# Patient Record
Sex: Female | Born: 1950 | Race: White | Hispanic: No | Marital: Married | Smoking: Former smoker
Health system: Southern US, Community
[De-identification: ages and names within clinical notes are randomized; demographics above are authoritative.]

## PROBLEM LIST (undated history)

## (undated) DIAGNOSIS — M1990 Unspecified osteoarthritis, unspecified site: Secondary | ICD-10-CM

## (undated) DIAGNOSIS — C3490 Malignant neoplasm of unspecified part of unspecified bronchus or lung: Secondary | ICD-10-CM

## (undated) DIAGNOSIS — J302 Other seasonal allergic rhinitis: Secondary | ICD-10-CM

## (undated) DIAGNOSIS — E039 Hypothyroidism, unspecified: Secondary | ICD-10-CM

## (undated) DIAGNOSIS — E785 Hyperlipidemia, unspecified: Secondary | ICD-10-CM

## (undated) DIAGNOSIS — J449 Chronic obstructive pulmonary disease, unspecified: Secondary | ICD-10-CM

## (undated) DIAGNOSIS — E049 Nontoxic goiter, unspecified: Secondary | ICD-10-CM

## (undated) DIAGNOSIS — F419 Anxiety disorder, unspecified: Secondary | ICD-10-CM

## (undated) HISTORY — DX: Other seasonal allergic rhinitis: J30.2

## (undated) HISTORY — PX: CORNEAL TRANSPLANT: SHX108

## (undated) HISTORY — DX: Malignant neoplasm of unspecified part of unspecified bronchus or lung: C34.90

## (undated) HISTORY — DX: Nontoxic goiter, unspecified: E04.9

## (undated) HISTORY — PX: ABDOMINAL HYSTERECTOMY: SHX81

## (undated) HISTORY — DX: Anxiety disorder, unspecified: F41.9

## (undated) HISTORY — PX: EYE SURGERY: SHX253

## (undated) HISTORY — PX: JOINT REPLACEMENT: SHX530

## (undated) HISTORY — DX: Hyperlipidemia, unspecified: E78.5

## (undated) HISTORY — DX: Chronic obstructive pulmonary disease, unspecified: J44.9

## (undated) HISTORY — PX: CATARACT EXTRACTION: SUR2

## (undated) MED FILL — Fosaprepitant Dimeglumine For IV Infusion 150 MG (Base Eq): INTRAVENOUS | Qty: 5 | Status: AC

---

## 1983-06-10 HISTORY — PX: BREAST BIOPSY: SHX20

## 1999-06-10 HISTORY — PX: BILATERAL OOPHORECTOMY: SHX1221

## 2004-04-26 ENCOUNTER — Ambulatory Visit: Payer: Self-pay | Admitting: Endocrinology

## 2008-05-11 LAB — BASIC METABOLIC PANEL: Creatinine: 1 mg/dL (ref <=1.1)

## 2008-05-11 LAB — HEPATIC FUNCTION PANEL
ALT: 20 U/L (ref 7–35)
AST: 22 U/L (ref 13–35)

## 2008-06-28 LAB — HM MAMMOGRAPHY: HM Mammogram: NORMAL

## 2008-08-18 LAB — LIPID PANEL
Cholesterol: 264 mg/dL — AB (ref 0–200)
HDL: 37 mg/dL (ref 35–70)
LDL Cholesterol: 154 mg/dL

## 2009-08-17 ENCOUNTER — Ambulatory Visit: Payer: Self-pay | Admitting: Unknown Physician Specialty

## 2009-08-17 HISTORY — PX: COLONOSCOPY: SHX174

## 2009-08-31 LAB — HM COLONOSCOPY

## 2011-07-16 ENCOUNTER — Other Ambulatory Visit: Payer: Self-pay | Admitting: *Deleted

## 2011-07-16 MED ORDER — LEVOTHYROXINE SODIUM 75 MCG PO TABS: 75.0000 ug | ORAL_TABLET | Freq: Every day | ORAL | Status: DC

## 2011-08-13 ENCOUNTER — Encounter: Payer: Self-pay | Admitting: Internal Medicine

## 2011-08-25 ENCOUNTER — Encounter: Payer: Self-pay | Admitting: Internal Medicine

## 2011-08-25 DIAGNOSIS — E049 Nontoxic goiter, unspecified: Secondary | ICD-10-CM | POA: Insufficient documentation

## 2011-09-08 LAB — HM COLONOSCOPY

## 2011-09-22 ENCOUNTER — Encounter: Payer: Self-pay | Admitting: Internal Medicine

## 2011-10-03 ENCOUNTER — Encounter: Payer: Self-pay | Admitting: Internal Medicine

## 2011-10-03 ENCOUNTER — Ambulatory Visit (INDEPENDENT_AMBULATORY_CARE_PROVIDER_SITE_OTHER): Payer: BC Managed Care – PPO | Admitting: Internal Medicine

## 2011-10-03 VITALS — BP 108/70 | HR 80 | Temp 98.3°F | Resp 16 | Ht 61.0 in | Wt 161.0 lb

## 2011-10-03 DIAGNOSIS — R5383 Other fatigue: Secondary | ICD-10-CM

## 2011-10-03 DIAGNOSIS — F411 Generalized anxiety disorder: Secondary | ICD-10-CM

## 2011-10-03 DIAGNOSIS — M65839 Other synovitis and tenosynovitis, unspecified forearm: Secondary | ICD-10-CM

## 2011-10-03 DIAGNOSIS — G4700 Insomnia, unspecified: Secondary | ICD-10-CM | POA: Insufficient documentation

## 2011-10-03 DIAGNOSIS — Z780 Asymptomatic menopausal state: Secondary | ICD-10-CM

## 2011-10-03 DIAGNOSIS — F419 Anxiety disorder, unspecified: Secondary | ICD-10-CM

## 2011-10-03 DIAGNOSIS — R5381 Other malaise: Secondary | ICD-10-CM

## 2011-10-03 DIAGNOSIS — Z5320 Procedure and treatment not carried out because of patient's decision for unspecified reasons: Secondary | ICD-10-CM | POA: Insufficient documentation

## 2011-10-03 DIAGNOSIS — E039 Hypothyroidism, unspecified: Secondary | ICD-10-CM | POA: Insufficient documentation

## 2011-10-03 DIAGNOSIS — J309 Allergic rhinitis, unspecified: Secondary | ICD-10-CM

## 2011-10-03 DIAGNOSIS — E785 Hyperlipidemia, unspecified: Secondary | ICD-10-CM

## 2011-10-03 DIAGNOSIS — M778 Other enthesopathies, not elsewhere classified: Secondary | ICD-10-CM | POA: Insufficient documentation

## 2011-10-03 DIAGNOSIS — M65849 Other synovitis and tenosynovitis, unspecified hand: Secondary | ICD-10-CM

## 2011-10-03 LAB — CBC WITH DIFFERENTIAL/PLATELET
Basophils Absolute: 0 10*3/uL (ref 0.0–0.1)
Basophils Relative: 0.9 % (ref 0.0–3.0)
Eosinophils Absolute: 0 10*3/uL (ref 0.0–0.7)
Lymphocytes Relative: 32.4 % (ref 12.0–46.0)
MCHC: 33.9 g/dL (ref 30.0–36.0)
Monocytes Absolute: 0.4 10*3/uL (ref 0.1–1.0)
Neutrophils Relative %: 57.1 % (ref 43.0–77.0)
Platelets: 209 10*3/uL (ref 150.0–400.0)
RBC: 4.52 Mil/uL (ref 3.87–5.11)
RDW: 12.4 % (ref 11.5–14.6)

## 2011-10-03 LAB — LDL CHOLESTEROL, DIRECT: Direct LDL: 158.1 mg/dL

## 2011-10-03 LAB — TSH: TSH: 1.69 u[IU]/mL (ref 0.35–5.50)

## 2011-10-03 LAB — LIPID PANEL: Cholesterol: 259 mg/dL — ABNORMAL HIGH (ref 0–200)

## 2011-10-03 MED ORDER — DICLOFENAC SODIUM 75 MG PO TBEC: 75.0000 mg | DELAYED_RELEASE_TABLET | Freq: Two times a day (BID) | ORAL | Status: AC

## 2011-10-03 MED ORDER — ESCITALOPRAM OXALATE 10 MG PO TABS: 10.0000 mg | ORAL_TABLET | Freq: Every day | ORAL | Status: DC

## 2011-10-03 MED ORDER — ZOLPIDEM TARTRATE ER 6.25 MG PO TBCR: 6.2500 mg | EXTENDED_RELEASE_TABLET | Freq: Every evening | ORAL | Status: DC | PRN

## 2011-10-03 NOTE — Patient Instructions (Addendum)
I am recommending a trial of Lexapro (escitalopram) for your hot flashes.  Start with 1/2 tablet daily in the PM, for one week, then increase to whole tablet.  For your insomnia, I am  prescirbing ambien controlled release  Take one at bedtime.  You may increase to 2 (total 12 .5 mg )  If needed after a few days,  But give the lexapro some time to work because it may also imporve the insomnia  You can use your alprazolam if you wake up in the middle of the night unable to go back to sleep  For the wrist pain, I am trrating your for tendonitis with diclofenac .  Take one tablet twice daily for at least two weeks.  You may add tylenol 500 mg up to 4 daily to this medication , but DO NOT ADD ALLEVE OR MOTRIN.  Take the Nexium once daily for the next 2 weeks (or OTC prilosec for stomach protection) while you are taking the diclofenac twice daily   Return in one month for you physical.    We will call you with the results of your labs

## 2011-10-03 NOTE — Progress Notes (Signed)
Patient ID: Lorraine Mills, female   DOB: Jan 26, 1951, 61 y.o.   MRN: [id number]    Patient Active Problem List  Diagnoses  . Goiter  . Tendonitis of wrist, right  . Menopause present, declines hormone replacement therapy  . Insomnia disorder related to known organic factor  . Hypothyroidism    Subjective:  CC:   Chief Complaint  Patient presents with  . Gynecologic Exam    HPI:   Lorraine Mills a 61 y.o. female who presents for management of persistent menopausal symptoms including flashes which have been reccurent for  20 yrs.  Does have disrupting her sleep. In spite of using 5 mg of Ambien daily she is waking up from the hot flashes times a night.  Her sister  had BRCA so HRT has been relatively contraindicated .     Her econd issue is  his bilateral wrist pain with the fourth and fifth fingers of both hands developing numbness and tingling at times. Symptoms occur more frequently and more prominently in the right hand compared to the left.  She reports feeling a white right hand is weaker than the left.  she is also reporting throbbing of the elbow and is wearing the wrist her braces at night.   there is no history of repetitive activity at work,  but  spends a moderate amount of time gardening .  She diagnosis of carpal tunnel syndrome several years ago and bought a wrist brace is over the counter.    Past Medical History  Diagnosis Date  . Goiter     Past Surgical History  Procedure Date  . Abdominal hysterectomy   . Bilateral oophorectomy 2001    BENIGN TUMOR         The following portions of the patient's history were reviewed and updated as appropriate: Allergies, current medications, and problem list.    Review of Systems:   12 Pt  review of systems was negative except those addressed in the HPI,     History   Social History  . Marital Status: Married    Spouse Name: N/A    Number of Children: N/A  . Years of Education: N/A   Occupational History  .  Not on file.   Social History Main Topics  . Smoking status: Former Smoker -- 24 years    Types: Cigarettes    Quit date: 05/09/1998  . Smokeless tobacco: Never Used  . Alcohol Use: Yes  . Drug Use: No  . Sexually Active: Not on file   Other Topics Concern  . Not on file   Social History Narrative  . No narrative on file    Objective:  BP 108/70  Pulse 80  Temp(Src) 98.3 F (36.8 C) (Oral)  Resp 16  Ht 5\' 1"  (1.549 m)  Wt 161 lb (73.029 kg)  BMI 30.42 kg/m2  SpO2 97%  General appearance: alert, cooperative and appears stated age Ears: normal TM's and external ear canals both ears Throat: lips, mucosa, and tongue normal; teeth and gums normal Neck: no adenopathy, no carotid bruit, supple, symmetrical, trachea midline and thyroid not enlarged, symmetric, no tenderness/mass/nodules Back: symmetric, no curvature. ROM normal. No CVA tenderness. Lungs: clear to auscultation bilaterally Heart: regular rate and rhythm, S1, S2 normal, no murmur, click, rub or gallop Abdomen: soft, non-tender; bowel sounds normal; no masses,  no organomegaly Pulses: 2+ and symmetric Skin: Skin color, texture, turgor normal. No rashes or lesions Lymph nodes: Cervical, supraclavicular, and axillary nodes normal.  Assessment and Plan:  Hypothyroidism Needs tsh checked and needs refill on meds  Tendonitis of wrist, right Trial of diclofenac offered. Continue use of wrist splints at night. For definitive activity, and recommended use of omeprazole or other PPI during trial of diclofenac. Samples of Nexium given to her for the first 2 weeks.  Menopause present, declines hormone replacement therapy Because of her sister's history of breast cancer we're both reluctant to consider hormone therapy. She has not tried Lexapro or Effexor. Given her concurrent anxiety we decided to try the Lexapro we'll start with 5 mg daily for the first week and increase to 10 after one week. I will also prescribe Ambien  CR to help with her insomnia at night.  Insomnia disorder related to known organic factor With frequent awakenings on short release Ambien due to menopause. Trial of Ambien CR.    Updated Medication List Outpatient Encounter Prescriptions as of 10/03/2011  Medication Sig Dispense Refill  . ALPRAZolam (XANAX) 0.25 MG tablet Take 0.25 mg by mouth at bedtime as needed.      . fexofenadine-pseudoephedrine (ALLEGRA-D 24) 180-240 MG per 24 hr tablet Take 1 tablet by mouth daily.      . fluticasone (FLONASE) 50 MCG/ACT nasal spray Place 2 sprays into the nose daily.      Marland Kitchen levothyroxine (SYNTHROID, LEVOTHROID) 75 MCG tablet Take 1 tablet (75 mcg total) by mouth daily.  90 tablet  0  . moxifloxacin (VIGAMOX) 0.5 % ophthalmic solution Place 1 drop into the right eye 3 (three) times daily.      . prednisoLONE acetate (PRED FORTE) 1 % ophthalmic suspension Place 1 drop into the right eye 4 (four) times daily.      Marland Kitchen DISCONTD: zolpidem (AMBIEN) 5 MG tablet Take 5 mg by mouth at bedtime as needed.      . diclofenac (VOLTAREN) 75 MG EC tablet Take 1 tablet (75 mg total) by mouth 2 (two) times daily.  60 tablet  1  . escitalopram (LEXAPRO) 10 MG tablet Take 1 tablet (10 mg total) by mouth daily.  30 tablet  2  . zolpidem (AMBIEN CR) 6.25 MG CR tablet Take 1 tablet (6.25 mg total) by mouth at bedtime as needed for sleep.  30 tablet  0     Orders Placed This Encounter  Procedures  . TSH  . Lipid panel  . COMPLETE METABOLIC PANEL WITH GFR  . CBC with Differential  . LDL cholesterol, direct    Return in about 1 month (around 11/02/2011).

## 2011-10-03 NOTE — Assessment & Plan Note (Signed)
Needs tsh checked and needs refill on meds

## 2011-10-04 LAB — COMPLETE METABOLIC PANEL WITH GFR
ALT: 14 U/L (ref 0–35)
Albumin: 4.6 g/dL (ref 3.5–5.2)
BUN: 17 mg/dL (ref 6–23)
CO2: 24 mEq/L (ref 19–32)
Calcium: 9 mg/dL (ref 8.4–10.5)
Chloride: 107 mEq/L (ref 96–112)
Creat: 0.85 mg/dL (ref 0.50–1.10)
GFR, Est African American: 86 mL/min
Potassium: 4.2 mEq/L (ref 3.5–5.3)

## 2011-10-05 ENCOUNTER — Encounter: Payer: Self-pay | Admitting: Internal Medicine

## 2011-10-05 NOTE — Assessment & Plan Note (Signed)
Because of her sister's history of breast cancer we're both reluctant to consider hormone therapy. She has not tried Lexapro or Effexor. Given her concurrent anxiety we decided to try the Lexapro we'll start with 5 mg daily for the first week and increase to 10 after one week. I will also prescribe Ambien CR to help with her insomnia at night.

## 2011-10-05 NOTE — Assessment & Plan Note (Signed)
With frequent awakenings on short release Ambien due to menopause. Trial of Ambien CR.

## 2011-10-05 NOTE — Assessment & Plan Note (Signed)
Trial of diclofenac offered. Continue use of wrist splints at night. For definitive activity, and recommended use of omeprazole or other PPI during trial of diclofenac. Samples of Nexium given to her for the first 2 weeks.

## 2011-10-06 ENCOUNTER — Telehealth: Payer: Self-pay | Admitting: *Deleted

## 2011-10-06 NOTE — Telephone Encounter (Signed)
Message copied by Regis Bill on Mon Oct 06, 2011  8:25 AM ------      Message from: Duncan Dull      Created: Sun Oct 05, 2011  9:06 PM       Her fasting labs were abnormal, her glucose level was diagnostic for diabetes. When sh returns for her annual PE, she needs to have hgba1c drawn, unless it  Can be added to prior lab draw

## 2011-10-06 NOTE — Telephone Encounter (Signed)
LMOM for Pt to call back for lab results/SLS

## 2011-10-10 ENCOUNTER — Other Ambulatory Visit: Payer: Self-pay | Admitting: *Deleted

## 2011-10-10 DIAGNOSIS — G4700 Insomnia, unspecified: Secondary | ICD-10-CM

## 2011-10-10 MED ORDER — ALPRAZOLAM 0.25 MG PO TABS: 0.2500 mg | ORAL_TABLET | Freq: Every evening | ORAL | Status: DC | PRN

## 2011-11-10 ENCOUNTER — Other Ambulatory Visit: Payer: Self-pay | Admitting: Internal Medicine

## 2011-11-10 DIAGNOSIS — M778 Other enthesopathies, not elsewhere classified: Secondary | ICD-10-CM

## 2011-11-10 MED ORDER — ZOLPIDEM TARTRATE ER 6.25 MG PO TBCR: 6.2500 mg | EXTENDED_RELEASE_TABLET | Freq: Every evening | ORAL | Status: DC | PRN

## 2011-11-19 ENCOUNTER — Encounter: Payer: BC Managed Care – PPO | Admitting: Internal Medicine

## 2012-01-06 ENCOUNTER — Encounter: Payer: Self-pay | Admitting: Internal Medicine

## 2012-01-15 ENCOUNTER — Other Ambulatory Visit: Payer: Self-pay | Admitting: Internal Medicine

## 2012-01-15 MED ORDER — LEVOTHYROXINE SODIUM 75 MCG PO TABS: 75.0000 ug | ORAL_TABLET | Freq: Every day | ORAL | Status: DC

## 2012-05-14 ENCOUNTER — Telehealth: Payer: Self-pay

## 2012-05-14 DIAGNOSIS — M778 Other enthesopathies, not elsewhere classified: Secondary | ICD-10-CM

## 2012-05-14 NOTE — Telephone Encounter (Signed)
Refill request for Ambien 6.25 mg. Ok to refill?

## 2012-05-16 MED ORDER — ZOLPIDEM TARTRATE ER 6.25 MG PO TBCR: 6.2500 mg | EXTENDED_RELEASE_TABLET | Freq: Every evening | ORAL | Status: DC | PRN

## 2012-05-16 NOTE — Telephone Encounter (Signed)
Ok to refill,  Authorized in epic 

## 2012-05-17 ENCOUNTER — Other Ambulatory Visit: Payer: Self-pay

## 2012-05-17 NOTE — Telephone Encounter (Signed)
Rx Ambien 6.25 mg faxed to [facility]

## 2012-10-07 ENCOUNTER — Other Ambulatory Visit (HOSPITAL_COMMUNITY)
Admission: RE | Admit: 2012-10-07 | Discharge: 2012-10-07 | Disposition: A | Discharge status: Home / Self Care | Payer: BC Managed Care – PPO | Source: Ambulatory Visit | Attending: Internal Medicine | Admitting: Internal Medicine

## 2012-10-07 ENCOUNTER — Ambulatory Visit (INDEPENDENT_AMBULATORY_CARE_PROVIDER_SITE_OTHER): Payer: BC Managed Care – PPO | Admitting: Internal Medicine

## 2012-10-07 ENCOUNTER — Encounter: Payer: Self-pay | Admitting: Internal Medicine

## 2012-10-07 VITALS — BP 132/78 | HR 67 | Temp 98.2°F | Resp 16 | Ht 62.0 in | Wt 157.2 lb

## 2012-10-07 DIAGNOSIS — B078 Other viral warts: Secondary | ICD-10-CM | POA: Insufficient documentation

## 2012-10-07 DIAGNOSIS — Z0000 Encounter for general adult medical examination without abnormal findings: Secondary | ICD-10-CM | POA: Insufficient documentation

## 2012-10-07 DIAGNOSIS — Z0001 Encounter for general adult medical examination with abnormal findings: Secondary | ICD-10-CM | POA: Insufficient documentation

## 2012-10-07 DIAGNOSIS — Z01419 Encounter for gynecological examination (general) (routine) without abnormal findings: Secondary | ICD-10-CM | POA: Insufficient documentation

## 2012-10-07 DIAGNOSIS — R5383 Other fatigue: Secondary | ICD-10-CM

## 2012-10-07 DIAGNOSIS — E785 Hyperlipidemia, unspecified: Secondary | ICD-10-CM

## 2012-10-07 DIAGNOSIS — R51 Headache: Secondary | ICD-10-CM

## 2012-10-07 DIAGNOSIS — Z1239 Encounter for other screening for malignant neoplasm of breast: Secondary | ICD-10-CM

## 2012-10-07 DIAGNOSIS — B079 Viral wart, unspecified: Secondary | ICD-10-CM

## 2012-10-07 DIAGNOSIS — M778 Other enthesopathies, not elsewhere classified: Secondary | ICD-10-CM

## 2012-10-07 DIAGNOSIS — Z5320 Procedure and treatment not carried out because of patient's decision for unspecified reasons: Secondary | ICD-10-CM

## 2012-10-07 DIAGNOSIS — R5381 Other malaise: Secondary | ICD-10-CM

## 2012-10-07 DIAGNOSIS — E559 Vitamin D deficiency, unspecified: Secondary | ICD-10-CM

## 2012-10-07 DIAGNOSIS — Z23 Encounter for immunization: Secondary | ICD-10-CM

## 2012-10-07 DIAGNOSIS — E039 Hypothyroidism, unspecified: Secondary | ICD-10-CM

## 2012-10-07 DIAGNOSIS — G4700 Insomnia, unspecified: Secondary | ICD-10-CM

## 2012-10-07 DIAGNOSIS — M65839 Other synovitis and tenosynovitis, unspecified forearm: Secondary | ICD-10-CM

## 2012-10-07 DIAGNOSIS — Z1151 Encounter for screening for human papillomavirus (HPV): Secondary | ICD-10-CM | POA: Insufficient documentation

## 2012-10-07 LAB — CBC WITH DIFFERENTIAL/PLATELET
Basophils Absolute: 0 10*3/uL (ref 0.0–0.1)
HCT: 43.8 % (ref 36.0–46.0)
Lymphs Abs: 1.5 10*3/uL (ref 0.7–4.0)
MCV: 93.1 fl (ref 78.0–100.0)
Monocytes Absolute: 0.5 10*3/uL (ref 0.1–1.0)
Platelets: 222 10*3/uL (ref 150.0–400.0)
RDW: 12.2 % (ref 11.5–14.6)

## 2012-10-07 LAB — COMPREHENSIVE METABOLIC PANEL
ALT: 15 U/L (ref 0–35)
CO2: 27 mEq/L (ref 19–32)
Calcium: 9.4 mg/dL (ref 8.4–10.5)
Chloride: 104 mEq/L (ref 96–112)
Creatinine, Ser: 1 mg/dL (ref 0.4–1.2)
GFR: 62.7 mL/min (ref >60.00)
Glucose, Bld: 103 mg/dL — ABNORMAL HIGH (ref 70–99)
Sodium: 136 mEq/L (ref 135–145)
Total Bilirubin: 0.7 mg/dL (ref 0.3–1.2)
Total Protein: 6.9 g/dL (ref 6.0–8.3)

## 2012-10-07 LAB — TSH: TSH: 2.02 u[IU]/mL (ref 0.35–5.50)

## 2012-10-07 MED ORDER — ZOLPIDEM TARTRATE ER 6.25 MG PO TBCR: 6.2500 mg | EXTENDED_RELEASE_TABLET | Freq: Every evening | ORAL | Status: DC | PRN

## 2012-10-07 MED ORDER — ALPRAZOLAM 0.25 MG PO TABS: 0.2500 mg | ORAL_TABLET | Freq: Every evening | ORAL | Status: DC | PRN

## 2012-10-07 MED ORDER — ESCITALOPRAM OXALATE 10 MG PO TABS: 10.0000 mg | ORAL_TABLET | Freq: Every day | ORAL | Status: DC

## 2012-10-07 NOTE — Patient Instructions (Addendum)
Resuming allegra d for a weeks to see if headache resolves   Mammogram ordered   Resume lexapro for hot flashes and anxiety.  Start with 1/2 tablet daily,  Can in crease if needed to full tablet after one week  Ok to use small dose of alprazolam for early wakenings  Lasting more than 15 minutes   Keep wart covered to prevent infection.  Allow blister to  Deflate on its own.

## 2012-10-07 NOTE — Assessment & Plan Note (Addendum)
Annual exam including breast , pelvic and PAP smear were done today. PAP smear was done because upon pelvic exam it was noted that she has a cervix

## 2012-10-07 NOTE — Progress Notes (Signed)
Patient ID: Lorraine Mills, female   DOB: Jul 18, 1950, 62 y.o.   MRN: [id number]    Subjective:     Lorraine Mills is a 62 y.o. female and is here for a comprehensive physical exam. The patient was last  seen April 2013 for menopausal symptoms which are persistent .  She has multiple problems to discuss today.  1)  uncontrolled anxiety , hot flashes, and recurrent headaches occurring every other day.  The severity of the headaches is usually mild. Occuring on the right  frontal area.  Chronicity is several weeks,  Always occurring later on in the day.  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  2) She has a wart on her right dorsal wrist and is requesting removal..    She is due for a PAP smear., mammogram and fasting labs,      History   Social History  . Marital Status: Married    Spouse Name: N/A    Number of Children: N/A  . Years of Education: N/A   Occupational History  . Not on file.   Social History Main Topics  . Smoking status: Former Smoker -- 24 years    Types: Cigarettes    Quit date: 05/09/1998  . Smokeless tobacco: Never Used  . Alcohol Use: Yes  . Drug Use: No  . Sexually Active: Not on file   Other Topics Concern  . Not on file   Social History Narrative  . No narrative on file   Health Maintenance  Topic Date Due  . Pap Smear  04/17/1969  . Zostavax  04/18/2011  . Mammogram  06/01/2011  . Influenza Vaccine  02/07/2013  . Colonoscopy  09/07/2021  . Tetanus/tdap  10/08/2022    The following portions of the patient's history were reviewed and updated as appropriate: allergies, current medications, past family history, past medical history, past social history, past surgical history and problem list.  Review of Systems A comprehensive review of systems was negative.   Objective:    BP 132/78  Pulse 67  Temp(Src) 98.2 F (36.8 C) (Oral)  Resp 16  Ht 5\' 2"  (1.575 m)  Wt 157 lb 4 oz (71.328 kg)  BMI 28.75 kg/m2  SpO2 98%  General Appearance:    Alert, cooperative, no distress, appears stated age  Head:    Normocephalic, without obvious abnormality, atraumatic  Eyes:    PERRL, conjunctiva/corneas clear, EOM's intact, fundi    benign, both eyes  Ears:    Normal TM's and external ear canals, both ears  Nose:   Nares normal, septum midline, mucosa normal, no drainage    or sinus tenderness  Throat:   Lips, mucosa, and tongue normal; teeth and gums normal  Neck:   Supple, symmetrical, trachea midline, no adenopathy;    thyroid:  no enlargement/tenderness/nodules; no carotid   bruit or JVD  Back:     Symmetric, no curvature, ROM normal, no CVA tenderness  Lungs:     Clear to auscultation bilaterally, respirations unlabored  Chest Wall:    No tenderness or deformity   Heart:    Regular rate and rhythm, S1 and S2 normal, no murmur, rub   or gallop  Breast Exam:    No tenderness, masses, or nipple abnormality  Abdomen:  Soft, non-tender, bowel sounds active all four quadrants,    no masses, no organomegaly  Genitalia:    Pelvic: cervix normal in appearance, external genitalia normal, no adnexal masses or tenderness, no cervical motion tenderness, rectovaginal septum normal, uterus surgicallly absent  and vagina normal without discharge  Extremities:   Extremities normal, atraumatic, no cyanosis or edema  Pulses:   2+ and symmetric all extremities  Skin:   Skin color, texture, turgor normal, no rashes or lesions  Lymph nodes:   Cervical, supraclavicular, and axillary nodes normal  Neurologic:   CNII-XII intact, normal strength, sensation and reflexes    throughout    Assessment and Plan:  Routine general medical examination at a health care facility Annual exam including breast , pelvic and PAP smear were done today. PAP smear was done because upon pelvic  exam it was noted that she has a cervix  Insomnia disorder related to known organic factor Continue alprazolam low dose for sleep initiation or maintenance.   Hypothyroidism tsh was wnl at 2.2. No dose changes.   Menopause present, declines hormone replacement therapy Trial of lexapro 5 mg daily,  Increase to 10 mg if needed   Headache Trial of allegra/allegra d for headache given exam and history   Verruca vulgaris Right dorsal wrist.  histofreeze applied for 40 sec per patient request.  Informed consent obtained prior to procedure.     Updated Medication List Outpatient Encounter Prescriptions as of 10/07/2012  Medication Sig Dispense Refill  . ALPRAZolam (XANAX) 0.25 MG tablet Take 1 tablet (0.25 mg total) by mouth at bedtime as needed.  30 tablet  2  . levothyroxine (SYNTHROID, LEVOTHROID) 75 MCG tablet Take 1 tablet (75 mcg total) by mouth daily.  90 tablet  2  . prednisoLONE acetate (PRED FORTE) 1 % ophthalmic suspension Place 1 drop into the right eye 4 (four) times daily.      . [DISCONTINUED] ALPRAZolam (XANAX) 0.25 MG tablet Take 1 tablet (0.25 mg total) by mouth at bedtime as needed.  30 tablet  2  . [DISCONTINUED] ALPRAZolam (XANAX) 0.25 MG tablet Take 1 tablet (0.25 mg total) by mouth at bedtime as needed.  30 tablet  2  . [DISCONTINUED] ALPRAZolam (XANAX) 0.25 MG tablet Take 1 tablet (0.25 mg total) by mouth at bedtime as needed.  30 tablet  2  . amoxicillin-clavulanate (AUGMENTIN) 875-125 MG per tablet       . escitalopram (LEXAPRO) 10 MG tablet Take 1 tablet (10 mg total) by mouth daily.  30 tablet  2  . fexofenadine-pseudoephedrine (ALLEGRA-D 24) 180-240 MG per 24 hr tablet Take 1 tablet by mouth daily.      . fluticasone (FLONASE) 50 MCG/ACT nasal spray Place 2 sprays into the nose daily.      Marland Kitchen moxifloxacin (VIGAMOX) 0.5 % ophthalmic solution Place 1 drop into the right eye 3 (three) times daily.      Marland Kitchen zolpidem (AMBIEN CR) 6.25 MG CR tablet Take 1 tablet (6.25 mg  total) by mouth at bedtime as needed for sleep.  30 tablet  5  . [DISCONTINUED] escitalopram (LEXAPRO) 10 MG tablet Take 1 tablet (10 mg total) by mouth daily.  30 tablet  2  . [DISCONTINUED] zolpidem (AMBIEN CR) 6.25 MG CR tablet Take 1 tablet (6.25 mg total) by mouth at bedtime as needed for sleep.  30 tablet  5   No facility-administered encounter medications on file as of 10/07/2012.

## 2012-10-08 LAB — VITAMIN D 25 HYDROXY (VIT D DEFICIENCY, FRACTURES): Vit D, 25-Hydroxy: 40 ng/mL (ref 30–89)

## 2012-10-09 ENCOUNTER — Encounter: Payer: Self-pay | Admitting: Internal Medicine

## 2012-10-09 DIAGNOSIS — R519 Headache, unspecified: Secondary | ICD-10-CM | POA: Insufficient documentation

## 2012-10-09 DIAGNOSIS — R51 Headache: Secondary | ICD-10-CM | POA: Insufficient documentation

## 2012-10-09 NOTE — Assessment & Plan Note (Addendum)
tsh was wnl at 2.2. No dose changes.

## 2012-10-09 NOTE — Assessment & Plan Note (Signed)
Right dorsal wrist.  histofreeze applied for 40 sec per patient request.  Informed consent obtained prior to procedure.

## 2012-10-09 NOTE — Assessment & Plan Note (Signed)
Trial of allegra/allegra d for headache given exam and history

## 2012-10-09 NOTE — Assessment & Plan Note (Signed)
Continue alprazolam low dose for sleep initiation or maintenance.    

## 2012-10-09 NOTE — Assessment & Plan Note (Signed)
Trial of lexapro 5 mg daily,  Increase to 10 mg if needed

## 2012-10-14 ENCOUNTER — Other Ambulatory Visit: Payer: Self-pay | Admitting: *Deleted

## 2012-10-14 MED ORDER — LEVOTHYROXINE SODIUM 75 MCG PO TABS: 75.0000 ug | ORAL_TABLET | Freq: Every day | ORAL | Status: DC

## 2012-10-31 LAB — HM PAP SMEAR: HM Pap smear: NEGATIVE

## 2012-12-01 LAB — HM MAMMOGRAPHY: HM Mammogram: NORMAL

## 2012-12-14 ENCOUNTER — Ambulatory Visit: Payer: Self-pay | Admitting: General Practice

## 2012-12-21 ENCOUNTER — Encounter: Payer: Self-pay | Admitting: Internal Medicine

## 2013-04-14 ENCOUNTER — Other Ambulatory Visit: Payer: Self-pay

## 2013-04-14 ENCOUNTER — Other Ambulatory Visit: Payer: Self-pay | Admitting: *Deleted

## 2013-04-14 MED ORDER — LEVOTHYROXINE SODIUM 75 MCG PO TABS: 75.0000 ug | ORAL_TABLET | Freq: Every day | ORAL | Status: DC

## 2013-08-25 ENCOUNTER — Other Ambulatory Visit: Payer: Self-pay | Admitting: Internal Medicine

## 2013-08-26 NOTE — Telephone Encounter (Signed)
Sent mychart message on need for appt. Last visit 10/07/12

## 2013-08-26 NOTE — Telephone Encounter (Signed)
Refill one 30 days only.  Has not been seen in over 6 months so needs office visit prior to any more refills

## 2013-08-30 NOTE — Telephone Encounter (Signed)
Patient needs an appointment for further refills. Left message to call office.

## 2013-10-10 ENCOUNTER — Other Ambulatory Visit: Payer: Self-pay | Admitting: Internal Medicine

## 2013-11-07 ENCOUNTER — Other Ambulatory Visit: Payer: Self-pay | Admitting: Internal Medicine

## 2013-11-07 NOTE — Telephone Encounter (Signed)
Appt 12/01/13

## 2013-12-01 ENCOUNTER — Encounter: Payer: Self-pay | Admitting: Internal Medicine

## 2013-12-01 ENCOUNTER — Ambulatory Visit (INDEPENDENT_AMBULATORY_CARE_PROVIDER_SITE_OTHER): Payer: BC Managed Care – PPO | Admitting: Internal Medicine

## 2013-12-01 ENCOUNTER — Encounter: Payer: Self-pay | Admitting: *Deleted

## 2013-12-01 VITALS — BP 140/90 | HR 71 | Temp 98.5°F | Resp 18 | Ht 61.0 in | Wt 161.5 lb

## 2013-12-01 DIAGNOSIS — E669 Obesity, unspecified: Secondary | ICD-10-CM | POA: Insufficient documentation

## 2013-12-01 DIAGNOSIS — R5383 Other fatigue: Secondary | ICD-10-CM

## 2013-12-01 DIAGNOSIS — E039 Hypothyroidism, unspecified: Secondary | ICD-10-CM

## 2013-12-01 DIAGNOSIS — E663 Overweight: Secondary | ICD-10-CM | POA: Insufficient documentation

## 2013-12-01 DIAGNOSIS — M778 Other enthesopathies, not elsewhere classified: Secondary | ICD-10-CM

## 2013-12-01 DIAGNOSIS — R5381 Other malaise: Secondary | ICD-10-CM

## 2013-12-01 DIAGNOSIS — E559 Vitamin D deficiency, unspecified: Secondary | ICD-10-CM

## 2013-12-01 DIAGNOSIS — Z1239 Encounter for other screening for malignant neoplasm of breast: Secondary | ICD-10-CM

## 2013-12-01 DIAGNOSIS — Z0000 Encounter for general adult medical examination without abnormal findings: Secondary | ICD-10-CM

## 2013-12-01 DIAGNOSIS — M65839 Other synovitis and tenosynovitis, unspecified forearm: Secondary | ICD-10-CM

## 2013-12-01 DIAGNOSIS — M65849 Other synovitis and tenosynovitis, unspecified hand: Secondary | ICD-10-CM

## 2013-12-01 DIAGNOSIS — E785 Hyperlipidemia, unspecified: Secondary | ICD-10-CM

## 2013-12-01 LAB — VITAMIN D 25 HYDROXY (VIT D DEFICIENCY, FRACTURES): VITD: 35.13 ng/mL

## 2013-12-01 LAB — COMPREHENSIVE METABOLIC PANEL
ALT: 16 U/L (ref 0–35)
AST: 18 U/L (ref 0–37)
Albumin: 4.4 g/dL (ref 3.5–5.2)
Alkaline Phosphatase: 47 U/L (ref 39–117)
BILIRUBIN TOTAL: 0.7 mg/dL (ref 0.2–1.2)
BUN: 17 mg/dL (ref 6–23)
CALCIUM: 9.3 mg/dL (ref 8.4–10.5)
CO2: 29 mEq/L (ref 19–32)
Chloride: 104 mEq/L (ref 96–112)
Creatinine, Ser: 1 mg/dL (ref 0.4–1.2)
GFR: 58.24 mL/min — ABNORMAL LOW (ref >60.00)
GLUCOSE: 110 mg/dL — AB (ref 70–99)
Potassium: 4.4 mEq/L (ref 3.5–5.1)
Sodium: 138 mEq/L (ref 135–145)
Total Protein: 6.6 g/dL (ref 6.0–8.3)

## 2013-12-01 LAB — CBC WITH DIFFERENTIAL/PLATELET
Basophils Absolute: 0 10*3/uL (ref 0.0–0.1)
Basophils Relative: 0.8 % (ref 0.0–3.0)
EOS PCT: 1.2 % (ref 0.0–5.0)
Eosinophils Absolute: 0.1 10*3/uL (ref 0.0–0.7)
HCT: 42.7 % (ref 36.0–46.0)
Hemoglobin: 14.6 g/dL (ref 12.0–15.0)
Lymphocytes Relative: 28.6 % (ref 12.0–46.0)
Lymphs Abs: 1.5 10*3/uL (ref 0.7–4.0)
MCHC: 34.2 g/dL (ref 30.0–36.0)
MCV: 92.9 fl (ref 78.0–100.0)
MONOS PCT: 7.9 % (ref 3.0–12.0)
Monocytes Absolute: 0.4 10*3/uL (ref 0.1–1.0)
NEUTROS ABS: 3.2 10*3/uL (ref 1.4–7.7)
Neutrophils Relative %: 61.5 % (ref 43.0–77.0)
Platelets: 205 10*3/uL (ref 150.0–400.0)
RBC: 4.59 Mil/uL (ref 3.87–5.11)
RDW: 12.5 % (ref 11.5–15.5)
WBC: 5.2 10*3/uL (ref 4.0–10.5)

## 2013-12-01 LAB — LIPID PANEL
CHOL/HDL RATIO: 7
Cholesterol: 268 mg/dL — ABNORMAL HIGH (ref 0–200)
HDL: 38.4 mg/dL — AB (ref >39.00)
LDL CALC: 162 mg/dL — AB (ref 0–99)
NonHDL: 229.6
TRIGLYCERIDES: 337 mg/dL — AB (ref 0.0–149.0)
VLDL: 67.4 mg/dL — AB (ref 0.0–40.0)

## 2013-12-01 LAB — TSH: TSH: 1.66 u[IU]/mL (ref 0.35–4.50)

## 2013-12-01 MED ORDER — ZOSTER VACCINE LIVE 19400 UNT/0.65ML ~~LOC~~ SOLR: 0.6500 mL | Freq: Once | SUBCUTANEOUS | Status: DC

## 2013-12-01 MED ORDER — ALPRAZOLAM 0.5 MG PO TABS: 0.5000 mg | ORAL_TABLET | Freq: Every evening | ORAL | Status: DC | PRN

## 2013-12-01 MED ORDER — ESCITALOPRAM OXALATE 20 MG PO TABS: 20.0000 mg | ORAL_TABLET | Freq: Every day | ORAL | Status: DC

## 2013-12-01 MED ORDER — ZOLPIDEM TARTRATE ER 6.25 MG PO TBCR: 6.2500 mg | EXTENDED_RELEASE_TABLET | Freq: Every evening | ORAL | Status: DC | PRN

## 2013-12-01 NOTE — Progress Notes (Signed)
Pre-visit discussion using our clinic review tool. No additional management support is needed unless otherwise documented below in the visit note.  

## 2013-12-01 NOTE — Patient Instructions (Addendum)
You had your annual  wellness exam today.    I have increased your lexapro to 20 mg daily and the alprazolam to 0.5 mg to take as needed for anxiety or early am insomnia   We will schedule your mammogram soon.  We will contact you with the bloodwork results  I recommend getting the majority of your calcium and Vitamin D  through diet rather than supplements given the recent association of calcium supplements with increased coronary artery calcium scores (You need 1200 mg daily )   Unsweetened almond/coconut milk is a great low calorie low carb, cholesterol free  way to increase your dietary calcium and vitamin D.  Try the blue Diamond  brand  Your BMI is 30.  (normal is < 25) I would like you to try to lose 15 lbs over the next 6 months .  This is my  version of a  "Low GI"  Diet:  It will still lower your blood sugars and allow you to lose 4 to 8  lbs  per month if you follow it carefully.  Your goal with exercise is a minimum of 30 minutes of aerobic exercise 5 days per week (Walking does not count once it becomes easy!)    All of the foods can be found at grocery stores and in bulk at [employer].  The Atkins protein bars and shakes are available in more varieties at [employer], [employer] and [place].     7 AM Breakfast:  Choose from the following:  Low carbohydrate Protein  Shakes (I recommend the EAS AdvantEdge "Carb Control" shakes  Or the low carb shakes by Atkins.    2.5 carbs   Arnold's "Sandwhich Thin"toasted  w/ peanut butter (no jelly: about 20 net carbs  "Bagel Thin" with cream cheese and salmon: about 20 carbs   a scrambled egg/bacon/cheese burrito made with Mission's "carb balance" whole wheat tortilla  (about 10 net carbs )   Avoid cereal and bananas, oatmeal and cream of wheat and grits. They are loaded with carbohydrates!   10 AM: high protein snack  Protein bar by Atkins (the snack size, under 200 cal, usually < 6 net carbs).    A stick of cheese:  Around 1 carb,  100  cal     Dannon Light n Fit [place] Yogurt  (80 cal, 8 carbs)  Other so called "protein bars" and Greek yogurts tend to be loaded with carbohydrates.  Remember, in food advertising, the word "energy" is synonymous for " carbohydrate."  Lunch:   A Sandwich using the bread choices listed, Can use any  Eggs,  lunchmeat, grilled meat or canned tuna), avocado, regular mayo/mustard  and cheese.  A Salad using blue cheese, ranch,  Goddess or vinagrette,  No croutons or "confetti" and no "candied nuts" but regular nuts OK.   No pretzels or chips.  Pickles and miniature sweet peppers are a good low carb alternative that provide a "crunch"  The bread is the only source of carbohydrate in a sandwich and  can be decreased by trying some of these alternatives to traditional loaf bread  Joseph's makes a pita bread and a flat bread that are 50 cal and 4 net carbs available at [place] and [employer].  This can be toasted to use with hummous as well  Toufayan makes a low carb flatbread that's 100 cal and 9 net carbs available at [employer] and [employer]'s makes 2 sizes of  Low carb whole  wheat tortilla  (The large one is 210 cal and 6 net carbs) Avoid "Low fat dressings, as well as Williamson dressings They are loaded with sugar!   3 PM/ Mid day  Snack:  Consider  1 ounce of  almonds, walnuts, pistachios, pecans, peanuts,  Macadamia nuts or a nut medley.  Avoid "granola"; the dried cranberries and raisins are loaded with carbohydrates. Mixed nuts as long as there are no raisins,  cranberries or dried fruit.    Try the prosciutto/mozzarella cheese sticks by Fiorruci  In deli /backery section   High protein      6 PM  Dinner:     Meat/fowl/fish with a green salad, and either broccoli, cauliflower, green beans, spinach, brussel sprouts or  Lima beans. DO NOT BREAD THE PROTEIN!!      There is a low carb pasta by Dreamfield's that is acceptable and tastes great: only 5 digestible carbs/serving.( All  grocery stores but BJs carry it )  Try Hurley Cisco Angelo's chicken piccata or chicken or eggplant parm over low carb pasta.(Lowes and BJs)   Marjory Lies Sanchez's "Carnitas" (pulled pork, no sauce,  0 carbs) or his beef pot roast to make a dinner burrito (at [employer]J's)  Pesto over low carb pasta (bj's sells a good quality pesto in the center refrigerated section of the deli   Try satueeing  Cheral Marker with mushroooms  Whole wheat pasta is still full of digestible carbs and  Not as low in glycemic index as Dreamfield's.   Brown rice is still rice,  So skip the rice and noodles if you eat [place] or [place] (or at least limit to 1/2 cup)  9 PM snack :   Breyer's "low carb" fudgsicle or  ice cream bar (Carb Smart line), or  Weight Watcher's ice cream bar , or another "no sugar added" ice cream;  a serving of fresh berries/cherries with whipped cream   Cheese or DANNON'S LlGHT N FIT GREEK YOGURT  8 ounces of Blue Diamond unsweetened almond/cococunut milk    Avoid bananas, pineapple, grapes  and watermelon on a regular basis because they are high in sugar.  THINK OF THEM AS DESSERT  Remember that snack Substitutions should be less than 10 NET carbs per serving and meals < 20 carbs. Remember to subtract fiber grams to get the "net carbs."

## 2013-12-03 ENCOUNTER — Encounter: Payer: Self-pay | Admitting: Internal Medicine

## 2013-12-03 DIAGNOSIS — E785 Hyperlipidemia, unspecified: Secondary | ICD-10-CM

## 2013-12-03 DIAGNOSIS — Z79899 Other long term (current) drug therapy: Secondary | ICD-10-CM

## 2013-12-03 NOTE — Assessment & Plan Note (Signed)
Thyroid function is WNL on current dose.  No current changes needed.  

## 2013-12-03 NOTE — Assessment & Plan Note (Signed)
Annual comprehensive exam was done including breast, excluding pelvic and PAP smear. All screenings have been addressed .  

## 2013-12-03 NOTE — Assessment & Plan Note (Signed)
Will recommend trial of fenfibrate for high triglycerides, low HDL.   Lab Results  Component Value Date   CHOL 268* 12/01/2013   HDL 38.40* 12/01/2013   LDLCALC 162* 12/01/2013   LDLDIRECT 153.0 10/07/2012   TRIG 337.0* 12/01/2013   CHOLHDL 7 12/01/2013   Lab Results  Component Value Date   ALT 16 12/01/2013   AST 18 12/01/2013   ALKPHOS 47 12/01/2013   BILITOT 0.7 12/01/2013

## 2013-12-03 NOTE — Progress Notes (Signed)
Patient ID: DEJIA EBRON, female   DOB: Aug 11, 1950, 63 y.o.   MRN: [id number]  Subjective:     Lorraine Mills is a 63 y.o. female and is here for a comprehensive physical exam. The patient reports no problems.  History   Social History  . Marital Status: Married    Spouse Name: N/A    Number of Children: N/A  . Years of Education: N/A   Occupational History  . Not on file.   Social History Main Topics  . Smoking status: Former Smoker -- 24 years    Types: Cigarettes    Quit date: 05/09/1998  . Smokeless tobacco: Never Used  . Alcohol Use: Yes  . Drug Use: No  . Sexual Activity: Not on file   Other Topics Concern  . Not on file   Social History Narrative  . No narrative on file   Health Maintenance  Topic Date Due  . Zostavax  04/18/2011  . Influenza Vaccine  01/07/2014  . Mammogram  12/02/2014  . Pap Smear  11/01/2015  . Colonoscopy  09/07/2021  . Tetanus/tdap  10/08/2022    The following portions of the patient's history were reviewed and updated as appropriate: allergies, current medications, past family history, past medical history, past social history, past surgical history and problem list.  Review of Systems A comprehensive review of systems was negative.   Objective:  BP 140/90  Pulse 71  Temp(Src) 98.5 F (36.9 C) (Oral)  Resp 18  Ht 5\' 1"  (1.549 m)  Wt 161 lb 8 oz (73.256 kg)  BMI 30.53 kg/m2  SpO2 98%   General appearance: alert, cooperative and appears stated age Head: Normocephalic, without obvious abnormality, atraumatic Eyes: conjunctivae/corneas clear. PERRL, EOM's intact. Fundi benign. Ears: normal TM's and external ear canals both ears Nose: Nares normal. Septum midline. Mucosa normal. No drainage or sinus tenderness. Throat: lips, mucosa, and tongue normal; teeth and gums normal Neck: no adenopathy, no carotid bruit, no JVD, supple, symmetrical, trachea midline and thyroid not enlarged, symmetric, no tenderness/mass/nodules Lungs: clear  to auscultation bilaterally Breasts: normal appearance, no masses or tenderness Heart: regular rate and rhythm, S1, S2 normal, no murmur, click, rub or gallop Abdomen: soft, non-tender; bowel sounds normal; no masses,  no organomegaly Extremities: extremities normal, atraumatic, no cyanosis or edema Pulses: 2+ and symmetric Skin: Skin color, texture, turgor normal. No rashes or lesions Neurologic: Alert and oriented X 3, normal strength and tone. Normal symmetric reflexes. Normal coordination and gait.    Assessment and Plan:   Hypothyroidism Thyroid function is WNL on current dose.  No current changes needed.   Dyslipidemia (high LDL; low HDL) Will recommend trial of fenfibrate for high triglycerides, low HDL.   Lab Results  Component Value Date   CHOL 268* 12/01/2013   HDL 38.40* 12/01/2013   LDLCALC 162* 12/01/2013   LDLDIRECT 153.0 10/07/2012   TRIG 337.0* 12/01/2013   CHOLHDL 7 12/01/2013   Lab Results  Component Value Date   ALT 16 12/01/2013   AST 18 12/01/2013   ALKPHOS 47 12/01/2013   BILITOT 0.7 12/01/2013     Routine general medical examination at a health care facility Annual comprehensive exam was done including breast, excluding pelvic and PAP smear. All screenings have been addressed .    Updated Medication List Outpatient Encounter Prescriptions as of 12/01/2013  Medication Sig  . ALPRAZolam (XANAX) 0.5 MG tablet Take 1 tablet (0.5 mg total) by mouth at bedtime as needed for anxiety.  Marland Kitchen  escitalopram (LEXAPRO) 20 MG tablet Take 1 tablet (20 mg total) by mouth daily.  . fexofenadine-pseudoephedrine (ALLEGRA-D 24) 180-240 MG per 24 hr tablet Take 1 tablet by mouth daily.  . fluticasone (FLONASE) 50 MCG/ACT nasal spray Place 2 sprays into the nose daily.  Marland Kitchen levothyroxine (SYNTHROID, LEVOTHROID) 75 MCG tablet TAKE ONE TABLET BY MOUTH EVERY DAY  . prednisoLONE acetate (PRED FORTE) 1 % ophthalmic suspension Place 1 drop into the right eye 4 (four) times daily.  .  [DISCONTINUED] ALPRAZolam (XANAX) 0.25 MG tablet TAKE (1) TABLET BY MOUTH AT BEDTIME AS NEEDED.  . [DISCONTINUED] escitalopram (LEXAPRO) 10 MG tablet TAKE ONE TABLET BY MOUTH EVERY DAY  . moxifloxacin (VIGAMOX) 0.5 % ophthalmic solution Place 1 drop into the right eye 3 (three) times daily.  Marland Kitchen zolpidem (AMBIEN CR) 6.25 MG CR tablet Take 1 tablet (6.25 mg total) by mouth at bedtime as needed for sleep.  Marland Kitchen zoster vaccine live, PF, (ZOSTAVAX) 97530 UNT/0.65ML injection Inject 19,400 Units into the skin once.  . [DISCONTINUED] amoxicillin-clavulanate (AUGMENTIN) 875-125 MG per tablet   . [DISCONTINUED] zolpidem (AMBIEN CR) 6.25 MG CR tablet Take 1 tablet (6.25 mg total) by mouth at bedtime as needed for sleep.

## 2013-12-06 NOTE — Telephone Encounter (Signed)
Mailed unread message to pt, requested call to office to discuss

## 2013-12-07 MED ORDER — FENOFIBRATE 48 MG PO TABS: 48.0000 mg | ORAL_TABLET | Freq: Every day | ORAL | Status: DC

## 2013-12-07 NOTE — Addendum Note (Signed)
Addended by: [name] on: 12/07/2013 01:17 PM   Modules accepted: Orders

## 2013-12-07 NOTE — Assessment & Plan Note (Signed)
Starting fenofibrate for trigs > 300.  Return in 3 months for fasting labs  Lab Results  Component Value Date   CHOL 268* 12/01/2013   HDL 38.40* 12/01/2013   LDLCALC 162* 12/01/2013   LDLDIRECT 153.0 10/07/2012   TRIG 337.0* 12/01/2013   CHOLHDL 7 12/01/2013

## 2013-12-09 ENCOUNTER — Other Ambulatory Visit: Payer: Self-pay | Admitting: Internal Medicine

## 2013-12-21 ENCOUNTER — Encounter: Payer: Self-pay | Admitting: Internal Medicine

## 2014-03-06 ENCOUNTER — Other Ambulatory Visit (INDEPENDENT_AMBULATORY_CARE_PROVIDER_SITE_OTHER): Payer: BC Managed Care – PPO

## 2014-03-06 DIAGNOSIS — E785 Hyperlipidemia, unspecified: Secondary | ICD-10-CM

## 2014-03-06 DIAGNOSIS — Z79899 Other long term (current) drug therapy: Secondary | ICD-10-CM

## 2014-03-06 LAB — LIPID PANEL
Cholesterol: 201 mg/dL — ABNORMAL HIGH (ref 0–200)
HDL: 36.4 mg/dL — ABNORMAL LOW (ref >39.00)
LDL CALC: 128 mg/dL — AB (ref 0–99)
NONHDL: 164.6
Total CHOL/HDL Ratio: 6
Triglycerides: 181 mg/dL — ABNORMAL HIGH (ref 0.0–149.0)
VLDL: 36.2 mg/dL (ref 0.0–40.0)

## 2014-03-07 ENCOUNTER — Encounter: Payer: Self-pay | Admitting: Internal Medicine

## 2014-03-07 LAB — COMPLETE METABOLIC PANEL WITH GFR
ALK PHOS: 39 U/L (ref 39–117)
ALT: 10 U/L (ref 0–35)
AST: 17 U/L (ref 0–37)
Albumin: 4.3 g/dL (ref 3.5–5.2)
BUN: 18 mg/dL (ref 6–23)
CO2: 26 meq/L (ref 19–32)
Calcium: 9.4 mg/dL (ref 8.4–10.5)
Chloride: 104 mEq/L (ref 96–112)
Creat: 0.93 mg/dL (ref 0.50–1.10)
GFR, EST AFRICAN AMERICAN: 76 mL/min
GFR, Est Non African American: 66 mL/min
Glucose, Bld: 107 mg/dL — ABNORMAL HIGH (ref 70–99)
Potassium: 4 mEq/L (ref 3.5–5.3)
Sodium: 141 mEq/L (ref 135–145)
Total Bilirubin: 0.4 mg/dL (ref 0.2–1.2)
Total Protein: 6.7 g/dL (ref 6.0–8.3)

## 2014-04-05 ENCOUNTER — Other Ambulatory Visit: Payer: Self-pay | Admitting: Internal Medicine

## 2014-05-25 ENCOUNTER — Encounter: Payer: Self-pay | Admitting: Internal Medicine

## 2014-05-25 ENCOUNTER — Ambulatory Visit (INDEPENDENT_AMBULATORY_CARE_PROVIDER_SITE_OTHER): Payer: BC Managed Care – PPO | Admitting: Internal Medicine

## 2014-05-25 VITALS — BP 118/70 | HR 83 | Temp 98.5°F | Ht 61.5 in | Wt 149.0 lb

## 2014-05-25 DIAGNOSIS — E669 Obesity, unspecified: Secondary | ICD-10-CM

## 2014-05-25 DIAGNOSIS — E038 Other specified hypothyroidism: Secondary | ICD-10-CM

## 2014-05-25 DIAGNOSIS — R5383 Other fatigue: Secondary | ICD-10-CM

## 2014-05-25 DIAGNOSIS — G4700 Insomnia, unspecified: Secondary | ICD-10-CM

## 2014-05-25 DIAGNOSIS — E034 Atrophy of thyroid (acquired): Secondary | ICD-10-CM

## 2014-05-25 DIAGNOSIS — Z90721 Acquired absence of ovaries, unilateral: Secondary | ICD-10-CM

## 2014-05-25 DIAGNOSIS — Z90710 Acquired absence of both cervix and uterus: Secondary | ICD-10-CM

## 2014-05-25 DIAGNOSIS — E049 Nontoxic goiter, unspecified: Secondary | ICD-10-CM

## 2014-05-25 LAB — COMPREHENSIVE METABOLIC PANEL
ALK PHOS: 37 U/L — AB (ref 39–117)
ALT: 16 U/L (ref 0–35)
AST: 20 U/L (ref 0–37)
Albumin: 4.3 g/dL (ref 3.5–5.2)
BILIRUBIN TOTAL: 0.6 mg/dL (ref 0.2–1.2)
BUN: 18 mg/dL (ref 6–23)
CO2: 24 mEq/L (ref 19–32)
CREATININE: 0.9 mg/dL (ref 0.4–1.2)
Calcium: 9.4 mg/dL (ref 8.4–10.5)
Chloride: 106 mEq/L (ref 96–112)
GFR: 64.7 mL/min (ref >60.00)
Glucose, Bld: 111 mg/dL — ABNORMAL HIGH (ref 70–99)
Potassium: 4.1 mEq/L (ref 3.5–5.1)
Sodium: 139 mEq/L (ref 135–145)
Total Protein: 7.2 g/dL (ref 6.0–8.3)

## 2014-05-25 MED ORDER — ESCITALOPRAM OXALATE 20 MG PO TABS: 20.0000 mg | ORAL_TABLET | Freq: Every day | ORAL | Status: DC

## 2014-05-25 MED ORDER — ZOLPIDEM TARTRATE ER 6.25 MG PO TBCR: 6.2500 mg | EXTENDED_RELEASE_TABLET | Freq: Every evening | ORAL | Status: DC | PRN

## 2014-05-25 NOTE — Progress Notes (Signed)
Patient ID: Lorraine Mills, female   DOB: [birth year], 63 y.o.   MRN: [id number]  Patient Active Problem List   Diagnosis Date Noted  . S/P hysterectomy with oophorectomy 05/27/2014  . Dyslipidemia (high LDL; low HDL) 12/01/2013  . Obesity 12/01/2013  . Headache(784.0) 10/09/2012  . Routine general medical examination at a health care facility 10/07/2012  . Verruca vulgaris 10/07/2012  . Menopause present, declines hormone replacement therapy 10/03/2011  . 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 Hypothyroidism 10/03/2011  . Goiter 08/25/2011    Subjective:  CC:   Chief Complaint  Patient presents with  . Follow-up    HPI:   Lorraine Mills is a 63 y.o. female who presents for  6 month follow up on chronic conditions inclusing GAD/depression, obesity and hypothyroidism.  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  Keeps grandson  5 days per week.  From 7:45 to 5:00 pm.  Has a good energy level,  No joint complaints,  insomnia managed,  No new issues     Past Medical History  Diagnosis Date  . Goiter     Past Surgical History  Procedure Laterality Date  . Bilateral oophorectomy  2001    BENIGN TUMOR  . Abdominal hysterectomy      supracervical        The following portions of the patient's history were reviewed and updated as appropriate: Allergies, current medications, and problem list.    Review of Systems:   Patient denies headache, fevers, malaise, unintentional weight loss, skin rash, eye pain, sinus congestion and sinus pain, sore throat, dysphagia,  hemoptysis , cough, dyspnea, wheezing, chest pain, palpitations, orthopnea, edema, abdominal pain, nausea, melena, diarrhea, constipation, flank pain, dysuria, hematuria, urinary  Frequency, nocturia, numbness, tingling, seizures,  Focal weakness, Loss of consciousness,  Tremor,  insomnia, depression, anxiety, and suicidal ideation.     History   Social History  . Marital Status: Married    Spouse Name: N/A    Number of Children: N/A  . Years of Education: N/A   Occupational History  . Not on file.   Social History Main Topics  . Smoking status: Former Smoker -- 24 years    Types: Cigarettes    Quit date: 05/09/1998  . Smokeless tobacco: Never Used  . Alcohol Use: Yes  . Drug Use: No  . Sexual Activity: Not on file   Other Topics Concern  . Not on file   Social History Narrative    Objective:  Filed Vitals:   05/25/14 0932  BP: 118/70  Pulse: 83  Temp: 98.5 F (36.9 C)     General appearance: alert, cooperative and appears stated age Ears: normal TM's and external ear canals both ears Throat: lips, mucosa, and tongue normal; teeth and gums normal Neck: no adenopathy, no carotid bruit, supple, symmetrical, trachea midline and thyroid not enlarged, symmetric, no tenderness/mass/nodules Back: symmetric, no curvature. ROM normal. No CVA tenderness. Lungs: clear to auscultation bilaterally Heart: regular rate and rhythm, S1, S2 normal, no murmur, click, rub or gallop Abdomen: soft, non-tender; bowel sounds normal; no masses,  no organomegaly Pulses: 2+ and symmetric Skin: Skin color, texture, turgor normal. No rashes or lesions Lymph nodes: Cervical, supraclavicular, and axillary nodes normal.  Assessment and Plan:  Hypothyroidism Thyroid function is WNL on current dose.  No  current changes needed.   Lab Results  Component Value Date   TSH 1.66 12/01/2013     Obesity I have congratulated her in reduction of   BMI and encouraged  Continued weight loss with goal of 10% of body weigh over the next 6 months using a low glycemic index diet and regular exercise a minimum of 5 days per week.  .Body mass index is 27.7 kg/(m^2).   Insomnia disorder related to known organic factor Continue alprazolam low dose for sleep initiation or  maintenance.      Updated Medication List Outpatient Encounter Prescriptions as of 05/25/2014  Medication Sig  . ALPRAZolam (XANAX) 0.5 MG tablet Take 1 tablet (0.5 mg total) by mouth at bedtime as needed for anxiety.  Marland Kitchen escitalopram (LEXAPRO) 20 MG tablet Take 1 tablet (20 mg total) by mouth daily.  . fenofibrate (TRICOR) 48 MG tablet Take 1 tablet (48 mg total) by mouth daily.  . fexofenadine-pseudoephedrine (ALLEGRA-D 24) 180-240 MG per 24 hr tablet Take 1 tablet by mouth daily.  . fluticasone (FLONASE) 50 MCG/ACT nasal spray Place 2 sprays into the nose daily.  Marland Kitchen levothyroxine (SYNTHROID, LEVOTHROID) 75 MCG tablet TAKE ONE TABLET BY MOUTH EVERY DAY  . prednisoLONE acetate (PRED FORTE) 1 % ophthalmic suspension Place 1 drop into the right eye 4 (four) times daily.  Marland Kitchen zolpidem (AMBIEN CR) 6.25 MG CR tablet Take 1 tablet (6.25 mg total) by mouth at bedtime as needed.  . [DISCONTINUED] escitalopram (LEXAPRO) 20 MG tablet Take 1 tablet (20 mg total) by mouth daily.  . [DISCONTINUED] zolpidem (AMBIEN CR) 6.25 MG CR tablet Take 6.25 mg by mouth at bedtime as needed.  . [DISCONTINUED] moxifloxacin (VIGAMOX) 0.5 % ophthalmic solution Place 1 drop into the right eye 3 (three) times daily.  . [DISCONTINUED] zolpidem (AMBIEN CR) 6.25 MG CR tablet Take 1 tablet (6.25 mg total) by mouth at bedtime as needed for sleep.  . [DISCONTINUED] zoster vaccine live, PF, (ZOSTAVAX) 92010 UNT/0.65ML injection Inject 19,400 Units into the skin once.     Orders Placed This Encounter  Procedures  . US Soft Tissue Head/Neck  . TSH  . Comp Met (CMET)    No Follow-up on file.

## 2014-05-25 NOTE — Progress Notes (Signed)
Pre visit review using our clinic review tool, if applicable. No additional management support is needed unless otherwise documented below in the visit note. 

## 2014-05-25 NOTE — Patient Instructions (Addendum)
You are doing well!  I'm glad you are feeling better   I have refilled all of your medications   Your should take 1000 units of Vit D3  Daily (or get it through your diet) during the winter months and 1200 mg calcium daily as well.  I recommend getting the majority of your calcium and Vitamin D  through diet rather than supplements given the recent association of calcium supplements with increased coronary artery calcium scores (You need 1200 mg daily )   Unsweetened almond/coconut milk is a great low calorie low carb, cholesterol free  way to increase your dietary calcium and vitamin D.  Try the blue Lorraine Mills  brand  We're checking your thyroid function and will check on your goiter with an ultrasound

## 2014-05-27 DIAGNOSIS — Z90721 Acquired absence of ovaries, unilateral: Secondary | ICD-10-CM

## 2014-05-27 DIAGNOSIS — Z90710 Acquired absence of both cervix and uterus: Secondary | ICD-10-CM | POA: Insufficient documentation

## 2014-05-27 NOTE — Assessment & Plan Note (Signed)
I have congratulated her in reduction of   BMI and encouraged  Continued weight loss with goal of 10% of body weigh over the next 6 months using a low glycemic index diet and regular exercise a minimum of 5 days per week.  .Body mass index is 27.7 kg/(m^2).

## 2014-05-27 NOTE — Assessment & Plan Note (Signed)
Continue alprazolam low dose for sleep initiation or maintenance.

## 2014-05-27 NOTE — Assessment & Plan Note (Signed)
Thyroid function is WNL on current dose.  No current changes needed.   Lab Results  Component Value Date   TSH 1.66 12/01/2013

## 2014-05-29 ENCOUNTER — Ambulatory Visit: Payer: Self-pay | Admitting: Internal Medicine

## 2014-05-29 ENCOUNTER — Other Ambulatory Visit: Payer: Self-pay | Admitting: Internal Medicine

## 2014-05-30 ENCOUNTER — Encounter: Payer: Self-pay | Admitting: Internal Medicine

## 2014-05-30 LAB — TSH: TSH: 0.957 u[IU]/mL (ref 0.350–4.500)

## 2014-06-01 ENCOUNTER — Telehealth: Payer: Self-pay | Admitting: Internal Medicine

## 2014-06-01 ENCOUNTER — Encounter: Payer: Self-pay | Admitting: Internal Medicine

## 2014-06-01 NOTE — Telephone Encounter (Signed)
Called patient no answer. Unable to leave a voicemail

## 2014-06-01 NOTE — Telephone Encounter (Signed)
The neck ultrasound showed two tiny nodules in the left lobe too small to sample with biopsy .  No goiter either

## 2014-06-05 ENCOUNTER — Other Ambulatory Visit: Payer: Self-pay | Admitting: Internal Medicine

## 2014-07-04 ENCOUNTER — Encounter: Payer: Self-pay | Admitting: Internal Medicine

## 2014-11-24 ENCOUNTER — Telehealth: Payer: Self-pay | Admitting: *Deleted

## 2014-11-24 DIAGNOSIS — E785 Hyperlipidemia, unspecified: Secondary | ICD-10-CM

## 2014-11-24 DIAGNOSIS — R5383 Other fatigue: Secondary | ICD-10-CM

## 2014-11-24 DIAGNOSIS — E034 Atrophy of thyroid (acquired): Secondary | ICD-10-CM

## 2014-11-24 NOTE — Telephone Encounter (Signed)
Pt coming in for labs on 06.20.2016 what labs and dx? 

## 2014-11-27 ENCOUNTER — Other Ambulatory Visit (INDEPENDENT_AMBULATORY_CARE_PROVIDER_SITE_OTHER): Payer: BLUE CROSS/BLUE SHIELD

## 2014-11-27 DIAGNOSIS — E034 Atrophy of thyroid (acquired): Secondary | ICD-10-CM | POA: Diagnosis not present

## 2014-11-27 DIAGNOSIS — E038 Other specified hypothyroidism: Secondary | ICD-10-CM | POA: Diagnosis not present

## 2014-11-27 DIAGNOSIS — E785 Hyperlipidemia, unspecified: Secondary | ICD-10-CM

## 2014-11-27 DIAGNOSIS — R5383 Other fatigue: Secondary | ICD-10-CM | POA: Diagnosis not present

## 2014-11-27 LAB — COMPREHENSIVE METABOLIC PANEL
ALT: 12 U/L (ref 0–35)
AST: 16 U/L (ref 0–37)
Albumin: 4.3 g/dL (ref 3.5–5.2)
Alkaline Phosphatase: 39 U/L (ref 39–117)
BILIRUBIN TOTAL: 0.5 mg/dL (ref 0.2–1.2)
BUN: 17 mg/dL (ref 6–23)
CHLORIDE: 105 meq/L (ref 96–112)
CO2: 28 meq/L (ref 19–32)
CREATININE: 0.84 mg/dL (ref 0.40–1.20)
Calcium: 9.4 mg/dL (ref 8.4–10.5)
GFR: 72.64 mL/min (ref >60.00)
Glucose, Bld: 109 mg/dL — ABNORMAL HIGH (ref 70–99)
Potassium: 4.3 mEq/L (ref 3.5–5.1)
Sodium: 139 mEq/L (ref 135–145)
TOTAL PROTEIN: 6.7 g/dL (ref 6.0–8.3)

## 2014-11-27 LAB — LIPID PANEL
Cholesterol: 223 mg/dL — ABNORMAL HIGH (ref 0–200)
HDL: 49.9 mg/dL (ref >39.00)
LDL Cholesterol: 155 mg/dL — ABNORMAL HIGH (ref 0–99)
NonHDL: 173.1
TRIGLYCERIDES: 89 mg/dL (ref 0.0–149.0)
Total CHOL/HDL Ratio: 4
VLDL: 17.8 mg/dL (ref 0.0–40.0)

## 2014-11-27 LAB — TSH: TSH: 1.41 u[IU]/mL (ref 0.35–4.50)

## 2014-11-30 ENCOUNTER — Encounter: Payer: Self-pay | Admitting: Internal Medicine

## 2014-12-04 ENCOUNTER — Encounter: Payer: BC Managed Care – PPO | Admitting: Internal Medicine

## 2014-12-04 ENCOUNTER — Other Ambulatory Visit: Payer: Self-pay | Admitting: Internal Medicine

## 2014-12-06 ENCOUNTER — Encounter: Payer: Self-pay | Admitting: Internal Medicine

## 2014-12-14 ENCOUNTER — Encounter: Payer: Self-pay | Admitting: Internal Medicine

## 2014-12-14 ENCOUNTER — Ambulatory Visit (INDEPENDENT_AMBULATORY_CARE_PROVIDER_SITE_OTHER): Payer: BLUE CROSS/BLUE SHIELD | Admitting: Internal Medicine

## 2014-12-14 VITALS — BP 112/70 | HR 68 | Temp 99.0°F | Resp 14 | Ht 61.0 in | Wt 151.1 lb

## 2014-12-14 DIAGNOSIS — E663 Overweight: Secondary | ICD-10-CM

## 2014-12-14 DIAGNOSIS — Z0000 Encounter for general adult medical examination without abnormal findings: Secondary | ICD-10-CM

## 2014-12-14 DIAGNOSIS — E034 Atrophy of thyroid (acquired): Secondary | ICD-10-CM

## 2014-12-14 MED ORDER — ZOLPIDEM TARTRATE ER 6.25 MG PO TBCR
6.2500 mg | EXTENDED_RELEASE_TABLET | Freq: Every evening | ORAL | Status: DC | PRN
Start: 2014-12-14 — End: 2016-01-31

## 2014-12-14 MED ORDER — LEVOTHYROXINE SODIUM 75 MCG PO TABS: 75.0000 ug | ORAL_TABLET | Freq: Every day | ORAL | Status: DC

## 2014-12-14 MED ORDER — ALPRAZOLAM 0.5 MG PO TABS: 0.5000 mg | ORAL_TABLET | Freq: Every evening | ORAL | Status: DC | PRN

## 2014-12-14 MED ORDER — ESCITALOPRAM OXALATE 20 MG PO TABS: 20.0000 mg | ORAL_TABLET | Freq: Every day | ORAL | Status: DC

## 2014-12-14 NOTE — Patient Instructions (Addendum)
I recommend getting the majority of your calcium and Vitamin D  through diet rather than supplements given the recent association of calcium supplements with increased coronary artery calcium scores  Try the almond and cashew milks that most grocery stores  now carry  in the dairy  Section>   They are lactose free:  [employer] brand Almond Light,  Original formula.  Delicious,  Low carb,  Low cal,  Cholesterol free   We'll order your mammogram ASAP  Your meds have been refilled  Health Maintenance Adopting a healthy lifestyle and getting preventive care can go a long way to promote health and wellness. Talk with your health care provider about what schedule of regular examinations is right for you. This is a good chance for you to check in with your provider about disease prevention and staying healthy. In between checkups, there are plenty of things you can do on your own. Experts have done a lot of research about which lifestyle changes and preventive measures are most likely to keep you healthy. Ask your health care provider for more information. WEIGHT AND DIET  Eat a healthy diet  Be sure to include plenty of vegetables, fruits, low-fat dairy products, and lean protein.  Do not eat a lot of foods high in solid fats, added sugars, or salt.  Get regular exercise. This is one of the most important things you can do for your health.  Most adults should exercise for at least 150 minutes each week. The exercise should increase your heart rate and make you sweat (moderate-intensity exercise).  Most adults should also do strengthening exercises at least twice a week. This is in addition to the moderate-intensity exercise.  Maintain a healthy weight  Body mass index (BMI) is a measurement that can be used to identify possible weight problems. It estimates body fat based on height and weight. Your health care provider can help determine your BMI and help you achieve or maintain a healthy  weight.  For females 64 years of age and older:   A BMI below 18.5 is considered underweight.  A BMI of 18.5 to 24.9 is normal.  A BMI of 25 to 29.9 is considered overweight.  A BMI of 30 and above is considered obese.  Watch levels of cholesterol and blood lipids  You should start having your blood tested for lipids and cholesterol at 64 years of age, then have this test every 5 years.  You may need to have your cholesterol levels checked more often if:  Your lipid or cholesterol levels are high.  You are older than 64 years of age.  You are at high risk for heart disease.  CANCER SCREENING   Lung Cancer  Lung cancer screening is recommended for adults 29-41 years old who are at high risk for lung cancer because of a history of smoking.  A yearly low-dose CT scan of the lungs is recommended for people who:  Currently smoke.  Have quit within the past 15 years.  Have at least a 30-pack-year history of smoking. A pack year is smoking an average of one pack of cigarettes a day for 1 year.  Yearly screening should continue until it has been 15 years since you quit.  Yearly screening should stop if you develop a health problem that would prevent you from having lung cancer treatment.  Breast Cancer  Practice breast self-awareness. This means understanding how your breasts normally appear and feel.  It also means doing regular breast self-exams.  Let your health care provider know about any changes, no matter how small.  If you are in your 20s or 30s, you should have a clinical breast exam (CBE) by a health care provider every 1-3 years as part of a regular health exam.  If you are 32 or older, have a CBE every year. Also consider having a breast X-ray (mammogram) every year.  If you have a family history of breast cancer, talk to your health care provider about genetic screening.  If you are at high risk for breast cancer, talk to your health care provider about  having an MRI and a mammogram every year.  Breast cancer gene (BRCA) assessment is recommended for women who have family members with BRCA-related cancers. BRCA-related cancers include:  Breast.  Ovarian.  Tubal.  Peritoneal cancers.  Results of the assessment will determine the need for genetic counseling and BRCA1 and BRCA2 testing. Cervical Cancer Routine pelvic examinations to screen for cervical cancer are no longer recommended for nonpregnant women who are considered low risk for cancer of the pelvic organs (ovaries, uterus, and vagina) and who do not have symptoms. A pelvic examination may be necessary if you have symptoms including those associated with pelvic infections. Ask your health care provider if a screening pelvic exam is right for you.   The Pap test is the screening test for cervical cancer for women who are considered at risk.  If you had a hysterectomy for a problem that was not cancer or a condition that could lead to cancer, then you no longer need Pap tests.  If you are older than 65 years, and you have had normal Pap tests for the past 10 years, you no longer need to have Pap tests.  If you have had past treatment for cervical cancer or a condition that could lead to cancer, you need Pap tests and screening for cancer for at least 20 years after your treatment.  If you no longer get a Pap test, assess your risk factors if they change (such as having a new sexual partner). This can affect whether you should start being screened again.  Some women have medical problems that increase their chance of getting cervical cancer. If this is the case for you, your health care provider may recommend more frequent screening and Pap tests.  The human papillomavirus (HPV) test is another test that may be used for cervical cancer screening. The HPV test looks for the virus that can cause cell changes in the cervix. The cells collected during the Pap test can be tested for  HPV.  The HPV test can be used to screen women 9 years of age and older. Getting tested for HPV can extend the interval between normal Pap tests from three to five years.  An HPV test also should be used to screen women of any age who have unclear Pap test results.  After 64 years of age, women should have HPV testing as often as Pap tests.  Colorectal Cancer  This type of cancer can be detected and often prevented.  Routine colorectal cancer screening usually begins at 64 years of age and continues through 64 years of age.  Your health care provider may recommend screening at an earlier age if you have risk factors for colon cancer.  Your health care provider may also recommend using home test kits to check for hidden blood in the stool.  A small camera at the end of a tube can be used  to examine your colon directly (sigmoidoscopy or colonoscopy). This is done to check for the earliest forms of colorectal cancer.  Routine screening usually begins at age 32.  Direct examination of the colon should be repeated every 5-10 years through 64 years of age. However, you may need to be screened more often if early forms of precancerous polyps or small growths are found. Skin Cancer  Check your skin from head to toe regularly.  Tell your health care provider about any new moles or changes in moles, especially if there is a change in a mole's shape or color.  Also tell your health care provider if you have a mole that is larger than the size of a pencil eraser.  Always use sunscreen. Apply sunscreen liberally and repeatedly throughout the day.  Protect yourself by wearing long sleeves, pants, a wide-brimmed hat, and sunglasses whenever you are outside. HEART DISEASE, DIABETES, AND HIGH BLOOD PRESSURE   Have your blood pressure checked at least every 1-2 years. High blood pressure causes heart disease and increases the risk of stroke.  If you are between 53 years and 33 years old, ask  your health care provider if you should take aspirin to prevent strokes.  Have regular diabetes screenings. This involves taking a blood sample to check your fasting blood sugar level.  If you are at a normal weight and have a low risk for diabetes, have this test once every three years after 64 years of age.  If you are overweight and have a high risk for diabetes, consider being tested at a younger age or more often. PREVENTING INFECTION  Hepatitis B  If you have a higher risk for hepatitis B, you should be screened for this virus. You are considered at high risk for hepatitis B if:  You were born in a country where hepatitis B is common. Ask your health care provider which countries are considered high risk.  Your parents were born in a high-risk country, and you have not been immunized against hepatitis B (hepatitis B vaccine).  You have HIV or AIDS.  You use needles to inject street drugs.  You live with someone who has hepatitis B.  You have had sex with someone who has hepatitis B.  You get hemodialysis treatment.  You take certain medicines for conditions, including cancer, organ transplantation, and autoimmune conditions. Hepatitis C  Blood testing is recommended for:  Everyone born from [birth year] through 1965.  Anyone with known risk factors for hepatitis C. Sexually transmitted infections (STIs)  You should be screened for sexually transmitted infections (STIs) including gonorrhea and chlamydia if:  You are sexually active and are younger than 64 years of age.  You are older than 64 years of age and your health care provider tells you that you are at risk for this type of infection.  Your sexual activity has changed since you were last screened and you are at an increased risk for chlamydia or gonorrhea. Ask your health care provider if you are at risk.  If you do not have HIV, but are at risk, it may be recommended that you take a prescription medicine daily to  prevent HIV infection. This is called pre-exposure prophylaxis (PrEP). You are considered at risk if:  You are sexually active and do not regularly use condoms or know the HIV status of your partner(s).  You take drugs by injection.  You are sexually active with a partner who has HIV. Talk with your health care provider  about whether you are at high risk of being infected with HIV. If you choose to begin PrEP, you should first be tested for HIV. You should then be tested every 3 months for as long as you are taking PrEP.  PREGNANCY   If you are premenopausal and you may become pregnant, ask your health care provider about preconception counseling.  If you may become pregnant, take 400 to 800 micrograms (mcg) of folic acid every day.  If you want to prevent pregnancy, talk to your health care provider about birth control (contraception). OSTEOPOROSIS AND MENOPAUSE   Osteoporosis is a disease in which the bones lose minerals and strength with aging. This can result in serious bone fractures. Your risk for osteoporosis can be identified using a bone density scan.  If you are 92 years of age or older, or if you are at risk for osteoporosis and fractures, ask your health care provider if you should be screened.  Ask your health care provider whether you should take a calcium or vitamin D supplement to lower your risk for osteoporosis.  Menopause may have certain physical symptoms and risks.  Hormone replacement therapy may reduce some of these symptoms and risks. Talk to your health care provider about whether hormone replacement therapy is right for you.  HOME CARE INSTRUCTIONS   Schedule regular health, dental, and eye exams.  Stay current with your immunizations.   Do not use any tobacco products including cigarettes, chewing tobacco, or electronic cigarettes.  If you are pregnant, do not drink alcohol.  If you are breastfeeding, limit how much and how often you drink  alcohol.  Limit alcohol intake to no more than 1 drink per day for nonpregnant women. One drink equals 12 ounces of beer, 5 ounces of wine, or 1 ounces of hard liquor.  Do not use street drugs.  Do not share needles.  Ask your health care provider for help if you need support or information about quitting drugs.  Tell your health care provider if you often feel depressed.  Tell your health care provider if you have ever been abused or do not feel safe at home. Document Released: 12/09/2010 Document Revised: 10/10/2013 Document Reviewed: 04/27/2013 [facility] Patient Information 2015 [place], [place]. This information is not intended to replace advice given to you by your health care provider. Make sure you discuss any questions you have with your health care provider.

## 2014-12-16 NOTE — Progress Notes (Signed)
Patient ID: Lorraine Mills, female    DOB: 1950-11-17  Age: 64 y.o. MRN: [id number]  The patient is here for annual  wellness examination and management of other chronic and acute problems.   The risk factors are reflected in the social history.  The roster of all physicians providing medical care to patient - is listed in the Snapshot section of the chart.  Activities of daily living:  The patient is 100% independent in all ADLs: dressing, toileting, feeding as well as independent mobility  Home safety : The patient has smoke detectors in the home. They wear seatbelts.  There are no firearms at home. There is no violence in the home.   There is no risks for hepatitis, STDs or HIV. There is no   history of blood transfusion. They have no travel history to infectious disease endemic areas of the world.  The patient has seen their dentist in the last six month. They have seen their eye doctor in the last year. They admit to slight hearing difficulty with regard to whispered voices and some television programs.  They have deferred audiologic testing in the last year.  They do not  have excessive sun exposure. Discussed the need for sun protection: hats, long sleeves and use of sunscreen if there is significant sun exposure.   Diet: the importance of a healthy diet is discussed. They do have a healthy diet.  The benefits of regular aerobic exercise were discussed. She walks 4 times per week ,  20 minutes.   Depression screen: there are no signs or vegative symptoms of depression- irritability, change in appetite, anhedonia, sadness/tearfullness.  Cognitive assessment: the patient manages all their financial and personal affairs and is actively engaged. They could relate day,date,year and events; recalled 2/3 objects at 3 minutes; performed clock-face test normally.  The following portions of the patient's history were reviewed and updated as appropriate: allergies, current medications, past family  history, past medical history,  past surgical history, past social history  and problem list.  Visual acuity was not assessed per patient preference since she has regular follow up with her ophthalmologist. Hearing and body mass index were assessed and reviewed.   During the course of the visit the patient was educated and counseled about appropriate screening and preventive services including : fall prevention , diabetes screening, nutrition counseling, colorectal cancer screening, and recommended immunizations.    CC: The primary encounter diagnosis was Routine general medical examination at a health care facility. Diagnoses of Overweight and Hypothyroidism due to acquired atrophy of thyroid were also pertinent to this visit.  History Lorraine Mills has a past medical history of Goiter.   She has past surgical history that includes Bilateral oophorectomy (2001) and Abdominal hysterectomy.   Her family history includes Cancer in her mother and sister; Stroke in her father; Thyroid disease in her other.She reports that she quit smoking about 16 years ago. Her smoking use included Cigarettes. She quit after 24 years of use. She has never used smokeless tobacco. She reports that she drinks alcohol. She reports that she does not use illicit drugs.  Outpatient Prescriptions Prior to Visit  Medication Sig Dispense Refill  . fenofibrate (TRICOR) 48 MG tablet TAKE ONE TABLET BY MOUTH EVERY DAY 90 tablet 1  . fexofenadine-pseudoephedrine (ALLEGRA-D 24) 180-240 MG per 24 hr tablet Take 1 tablet by mouth daily.    . fluticasone (FLONASE) 50 MCG/ACT nasal spray Place 2 sprays into the nose daily.    . prednisoLONE  acetate (PRED FORTE) 1 % ophthalmic suspension Place 1 drop into the right eye 4 (four) times daily.    Marland Kitchen ALPRAZolam (XANAX) 0.5 MG tablet Take 1 tablet (0.5 mg total) by mouth at bedtime as needed for anxiety. 30 tablet 5  . escitalopram (LEXAPRO) 20 MG tablet Take 1 tablet (20 mg total) by mouth daily.  30 tablet 5  . levothyroxine (SYNTHROID, LEVOTHROID) 75 MCG tablet TAKE ONE TABLET BY MOUTH EVERY DAY 30 tablet 9  . zolpidem (AMBIEN CR) 6.25 MG CR tablet Take 1 tablet (6.25 mg total) by mouth at bedtime as needed. 30 tablet 5   No facility-administered medications prior to visit.    Review of Systems   Patient denies headache, fevers, malaise, unintentional weight loss, skin rash, eye pain, sinus congestion and sinus pain, sore throat, dysphagia,  hemoptysis , cough, dyspnea, wheezing, chest pain, palpitations, orthopnea, edema, abdominal pain, nausea, melena, diarrhea, constipation, flank pain, dysuria, hematuria, urinary  Frequency, nocturia, numbness, tingling, seizures,  Focal weakness, Loss of consciousness,  Tremor, insomnia, depression, anxiety, and suicidal ideation.      Objective:  BP 112/70 mmHg  Pulse 68  Temp(Src) 99 F (37.2 C) (Oral)  Resp 14  Ht '5\' 1"'$  (1.549 m)  Wt 151 lb 2 oz (68.55 kg)  BMI 28.57 kg/m2  SpO2 97%  Physical Exam   General appearance: alert, cooperative and appears stated age Head: Normocephalic, without obvious abnormality, atraumatic Eyes: conjunctivae/corneas clear. PERRL, EOM's intact. Fundi benign. Ears: normal TM's and external ear canals both ears Nose: Nares normal. Septum midline. Mucosa normal. No drainage or sinus tenderness. Throat: lips, mucosa, and tongue normal; teeth and gums normal Neck: no adenopathy, no carotid bruit, no JVD, supple, symmetrical, trachea midline and thyroid not enlarged, symmetric, no tenderness/mass/nodules Lungs: clear to auscultation bilaterally Breasts: normal appearance, no masses or tenderness Heart: regular rate and rhythm, S1, S2 normal, no murmur, click, rub or gallop Abdomen: soft, non-tender; bowel sounds normal; no masses,  no organomegaly Extremities: extremities normal, atraumatic, no cyanosis or edema Pulses: 2+ and symmetric Skin: Skin color, texture, turgor normal. No rashes or  lesions Neurologic: Alert and oriented X 3, normal strength and tone. Normal symmetric reflexes. Normal coordination and gait.     Assessment & Plan:   Problem List Items Addressed This Visit      Unprioritized   Hypothyroidism    Thyroid function is WNL on current dose.  No current changes needed.   Lab Results  Component Value Date   TSH 1.41 11/27/2014         Relevant Medications   levothyroxine (SYNTHROID, LEVOTHROID) 75 MCG tablet   Routine general medical examination at a health care facility - Primary    Annual wellness  exam was done as well as a comprehensive physical exam  .  During the course of the visit the patient was educated and counseled about appropriate screening and preventive services and screenings were brought up to date for cervical and breast cancer .  She will return for fasting labs to provide samples for diabetes screening and lipid analysis with projected  10 year  risk for CAD. nutrition counseling, skin cancer screening has been recommended, along with review of the age appropriate recommended immunizations.  Printed recommendations for health maintenance screenings was given.        Overweight    I have addressed  BMI and recommended wt loss of 10% of body weigh over the next 6 months using a low glycemic  index diet and regular exercise a minimum of 5 days per week.           I have changed Ms. Thielman levothyroxine. I am also having her maintain her fexofenadine-pseudoephedrine, fluticasone, prednisoLONE acetate, fenofibrate, zolpidem, escitalopram, and ALPRAZolam.  Meds ordered this encounter  Medications  . zolpidem (AMBIEN CR) 6.25 MG CR tablet    Sig: Take 1 tablet (6.25 mg total) by mouth at bedtime as needed.    Dispense:  90 tablet    Refill:  1  . levothyroxine (SYNTHROID, LEVOTHROID) 75 MCG tablet    Sig: Take 1 tablet (75 mcg total) by mouth daily.    Dispense:  90 tablet    Refill:  1  . escitalopram (LEXAPRO) 20 MG tablet     Sig: Take 1 tablet (20 mg total) by mouth daily.    Dispense:  90 tablet    Refill:  1  . ALPRAZolam (XANAX) 0.5 MG tablet    Sig: Take 1 tablet (0.5 mg total) by mouth at bedtime as needed for anxiety.    Dispense:  30 tablet    Refill:  5    Medications Discontinued During This Encounter  Medication Reason  . zolpidem (AMBIEN CR) 6.25 MG CR tablet Reorder  . levothyroxine (SYNTHROID, LEVOTHROID) 75 MCG tablet Reorder  . escitalopram (LEXAPRO) 20 MG tablet Reorder  . ALPRAZolam (XANAX) 0.5 MG tablet Reorder    Follow-up: Return in about 6 months (around 06/16/2015).   [name], MD

## 2014-12-16 NOTE — Assessment & Plan Note (Addendum)
I have addressed  BMI and recommended wt loss of 10% of body weigh over the next 6 months using a low glycemic index diet and regular exercise a minimum of 5 days per week.   

## 2014-12-16 NOTE — Assessment & Plan Note (Signed)
Annual wellness  exam was done as well as a comprehensive physical exam  .  During the course of the visit the patient was educated and counseled about appropriate screening and preventive services and screenings were brought up to date for cervical and breast cancer .  She will return for fasting labs to provide samples for diabetes screening and lipid analysis with projected  10 year  risk for CAD. nutrition counseling, skin cancer screening has been recommended, along with review of the age appropriate recommended immunizations.  Printed recommendations for health maintenance screenings was given.

## 2014-12-16 NOTE — Assessment & Plan Note (Signed)
Thyroid function is WNL on current dose.  No current changes needed.   Lab Results  Component Value Date   TSH 1.41 11/27/2014

## 2015-03-25 ENCOUNTER — Emergency Department
Admission: EM | Admit: 2015-03-25 | Discharge: 2015-03-25 | Disposition: A | Discharge status: Home / Self Care | Payer: BLUE CROSS/BLUE SHIELD | Attending: Emergency Medicine | Admitting: Emergency Medicine

## 2015-03-25 ENCOUNTER — Encounter: Payer: Self-pay | Admitting: *Deleted

## 2015-03-25 ENCOUNTER — Emergency Department: Payer: BLUE CROSS/BLUE SHIELD

## 2015-03-25 DIAGNOSIS — X58XXXA Exposure to other specified factors, initial encounter: Secondary | ICD-10-CM | POA: Diagnosis not present

## 2015-03-25 DIAGNOSIS — Z7952 Long term (current) use of systemic steroids: Secondary | ICD-10-CM | POA: Diagnosis not present

## 2015-03-25 DIAGNOSIS — Z7951 Long term (current) use of inhaled steroids: Secondary | ICD-10-CM | POA: Diagnosis not present

## 2015-03-25 DIAGNOSIS — R51 Headache: Secondary | ICD-10-CM | POA: Insufficient documentation

## 2015-03-25 DIAGNOSIS — Y9389 Activity, other specified: Secondary | ICD-10-CM | POA: Insufficient documentation

## 2015-03-25 DIAGNOSIS — S01502A Unspecified open wound of oral cavity, initial encounter: Secondary | ICD-10-CM

## 2015-03-25 DIAGNOSIS — Z87891 Personal history of nicotine dependence: Secondary | ICD-10-CM | POA: Insufficient documentation

## 2015-03-25 DIAGNOSIS — S00532A Contusion of oral cavity, initial encounter: Secondary | ICD-10-CM | POA: Insufficient documentation

## 2015-03-25 DIAGNOSIS — Y998 Other external cause status: Secondary | ICD-10-CM | POA: Diagnosis not present

## 2015-03-25 DIAGNOSIS — Y92091 Bathroom in other non-institutional residence as the place of occurrence of the external cause: Secondary | ICD-10-CM | POA: Insufficient documentation

## 2015-03-25 DIAGNOSIS — Z79899 Other long term (current) drug therapy: Secondary | ICD-10-CM | POA: Diagnosis not present

## 2015-03-25 DIAGNOSIS — R569 Unspecified convulsions: Secondary | ICD-10-CM | POA: Insufficient documentation

## 2015-03-25 LAB — CBC
HCT: 38.6 % (ref 35.0–47.0)
HEMOGLOBIN: 13.4 g/dL (ref 12.0–16.0)
MCH: 32.4 pg (ref 26.0–34.0)
MCHC: 34.6 g/dL (ref 32.0–36.0)
MCV: 93.7 fL (ref 80.0–100.0)
Platelets: 193 10*3/uL (ref 150–440)
RBC: 4.13 MIL/uL (ref 3.80–5.20)
RDW: 13.1 % (ref 11.5–14.5)
WBC: 6.7 10*3/uL (ref 3.6–11.0)

## 2015-03-25 LAB — BASIC METABOLIC PANEL
ANION GAP: 11 (ref 5–15)
BUN: 17 mg/dL (ref 6–20)
CHLORIDE: 107 mmol/L (ref 101–111)
CO2: 22 mmol/L (ref 22–32)
Calcium: 8.8 mg/dL — ABNORMAL LOW (ref 8.9–10.3)
Creatinine, Ser: 1.02 mg/dL — ABNORMAL HIGH (ref 0.44–1.00)
GFR calc Af Amer: >60 mL/min (ref >60)
GFR, EST NON AFRICAN AMERICAN: 57 mL/min — AB (ref >60)
GLUCOSE: 85 mg/dL (ref 65–99)
POTASSIUM: 3.2 mmol/L — AB (ref 3.5–5.1)
Sodium: 140 mmol/L (ref 135–145)

## 2015-03-25 MED ORDER — LORAZEPAM 2 MG/ML IJ SOLN
0.5000 mg | Freq: Once | INTRAMUSCULAR | Status: AC
  Administered 2015-03-25: 0.5 mg via INTRAVENOUS
  Filled 2015-03-25: qty 1

## 2015-03-25 NOTE — ED Provider Notes (Signed)
[facility] Emergency Department Provider Note  ____________________________________________  Time seen:  1705  I have reviewed the triage vital signs and the nursing notes.   HISTORY  Chief Complaint Seizures     HPI Lorraine Mills is a 65 y.o. female without history of prior seizure who had a seizure just prior to arrival.  The patient reports she is going to the bathroom, wash her hands, and return to sit down. She has no recollection of events after she had sat down. The family was present and reports she began to shake fairly violently and dramatically. It is unclear how long the seizure lasted. 911 was called. The seizures stopped by the time that they had arrived but the patient was post ictal. The patient did bite her tongue. Notable ecchymosis to the tip and right side.  At this time. The patient reports a 7 out of 10 headache in her head primarily on the right. She denies any neurologic dysfunction. Her hands and feet are moving appropriately. She is alert and communicative.    Past Medical History  Diagnosis Date  . Goiter     Patient Active Problem List   Diagnosis Date Noted  . S/P hysterectomy with oophorectomy 05/27/2014  . Dyslipidemia (high LDL; low HDL) 12/01/2013  . Overweight 12/01/2013  . Headache(784.0) 10/09/2012  . Routine general medical examination at a health care facility 10/07/2012  . Verruca vulgaris 10/07/2012  . Menopause present, declines hormone replacement therapy 10/03/2011  . 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 Hypothyroidism 10/03/2011  . Goiter 08/25/2011    Past Surgical History  Procedure Laterality Date  . Bilateral oophorectomy  2001    BENIGN TUMOR  . Abdominal hysterectomy      supracervical     Current Outpatient Rx  Name  Route  Sig  Dispense  Refill  . ALPRAZolam (XANAX) 0.5 MG tablet   Oral   Take 1 tablet (0.5 mg total) by mouth at bedtime as needed for  anxiety.   30 tablet   5   . escitalopram (LEXAPRO) 20 MG tablet   Oral   Take 1 tablet (20 mg total) by mouth daily.   90 tablet   1   . fenofibrate (TRICOR) 48 MG tablet      TAKE ONE TABLET BY MOUTH EVERY DAY   90 tablet   1   . fexofenadine-pseudoephedrine (ALLEGRA-D 24) 180-240 MG per 24 hr tablet   Oral   Take 1 tablet by mouth daily.         . fluticasone (FLONASE) 50 MCG/ACT nasal spray   Nasal   Place 2 sprays into the nose daily.         Marland Kitchen levothyroxine (SYNTHROID, LEVOTHROID) 75 MCG tablet   Oral   Take 1 tablet (75 mcg total) by mouth daily.   90 tablet   1   . prednisoLONE acetate (PRED FORTE) 1 % ophthalmic suspension   Right Eye   Place 1 drop into the right eye 4 (four) times daily.         Marland Kitchen zolpidem (AMBIEN CR) 6.25 MG CR tablet   Oral   Take 1 tablet (6.25 mg total) by mouth at bedtime as needed.   90 tablet   1     Allergies Levofloxacin  Family History  Problem Relation Age of Onset  . Cancer Mother     OVARIAN  . Stroke Father   . Cancer Sister  BREAST  . Thyroid disease Other     MULTIPLE FAMILY MEMBERS    Social History Social History  Substance Use Topics  . Smoking status: Former Smoker -- 24 years    Types: Cigarettes    Quit date: 05/09/1998  . Smokeless tobacco: Never Used  . Alcohol Use: Yes    Review of Systems  Constitutional: Negative for fever. ENT: Negative for sore throat. Cardiovascular: Negative for chest pain. Respiratory: Negative for cough. Gastrointestinal: Negative for abdominal pain, vomiting and diarrhea. Genitourinary: Negative for dysuria. Musculoskeletal: No myalgias or injuries. Skin: Negative for rash. Neurological: New-onset seizure. See history of present illness   10-point ROS otherwise negative.  ____________________________________________   PHYSICAL EXAM:  VITAL SIGNS: ED Triage Vitals  Enc Vitals Group     BP 03/25/15 1633 113/68 mmHg     Pulse Rate 03/25/15 1633  85     Resp 03/25/15 1633 15     Temp 03/25/15 1633 97.8 F (36.6 C)     Temp Source 03/25/15 1633 Oral     SpO2 03/25/15 1633 96 %     Weight 03/25/15 1633 155 lb (70.308 kg)     Height 03/25/15 1633 '5\' 1"'$  (1.549 m)     Head Cir --      Peak Flow --      Pain Score 03/25/15 1634 7     Pain Loc --      Pain Edu? --      Excl. in [place]? --     Constitutional: Alert and oriented. No distress. ENT   Head: Normocephalic and atraumatic.   Nose: No congestion/rhinnorhea.        Mouth: Ecchymotic and tender areas to her tongue, anterior and slightly to the right. Cardiovascular: Normal rate, regular rhythm, no murmur noted Respiratory:  Normal respiratory effort, no tachypnea.    Breath sounds are clear and equal bilaterally.  Gastrointestinal: Soft and nontender. No distention.  Back: No muscle spasm, no tenderness, no CVA tenderness. Musculoskeletal: No deformity noted. Nontender with normal range of motion in all extremities.  No noted edema. Neurologic:  Normal speech and language. Equal grip strength, 5 over 5 strength in all 4 extremities. Sensation intact in all 4 extremities area renal nerves II through XII are intact. No gross focal neurologic deficits are appreciated.  Skin:  Skin is warm, dry. No rash noted. Psychiatric: Mood and affect are normal. Speech and behavior are normal.  ____________________________________________    LABS (pertinent positives/negatives)  Labs Reviewed  BASIC METABOLIC PANEL - Abnormal; Notable for the following:    Potassium 3.2 (*)    Creatinine, Ser 1.02 (*)    Calcium 8.8 (*)    GFR calc non Af Amer 57 (*)    All other components within normal limits  CBC     ____________________________________________   EKG    ____________________________________________    RADIOLOGY  CT head: FINDINGS: No evidence of intracranial hemorrhage, brain edema, or other signs of acute infarction. No evidence of intracranial mass lesion or  mass effect.  No abnormal extraaxial fluid collections identified. Ventricles are normal in size. No skull abnormality identified.  IMPRESSION: Negative noncontrast head CT.  ____________________________________________   PROCEDURES   ____________________________________________   INITIAL IMPRESSION / ASSESSMENT AND PLAN / ED COURSE  Pertinent labs & imaging results that were available during my care of the patient were reviewed by me and considered in my medical decision making (see chart for details).  43 all female with a  first-time, new onset seizure. Appears to be tonic-clonic. Seizure has resolved, patient is now alert and communicative without any neurologic deficit. We will obtain a head CT and checked basic labs. I will treat the patient with Ativan, 0.5 mg, IV.  ----------------------------------------- 7:11 PM on 03/25/2015 -----------------------------------------  Head CT is negative. Potassium is slightly low at 3.2 but do not think that would've caused this seizure. Blood count is good with white blood cell count 6.7 and hemoglobin of 13.4.  On reexamination, the patient feels well. She is alert and communicative. She is using her smart phone without difficulty. I have discussed her results with her and her family. Her primary physician is Dr. Derrel Nip. I've asked that she follow-up with her. I have recommended that she speak to Dr. Derrel Nip about an outpatient MRI and possible neurology referral.  I've advised the patient, with her family, that she should not drive. The patient also takes care of her grandchildren 5 days a week. I've advised that she not do this alone through the next week. Once seen by Dr. Derrel Nip and presuming the patient does not have any further seizure activity, I think it will be safe to resume her normal childcare activities. ____________________________________________   FINAL CLINICAL IMPRESSION(S) / ED DIAGNOSES  Final diagnoses:  New onset  seizure ([place])  Tongue wound, initial encounter      Ahmed Prima, MD 03/25/15 [phone]

## 2015-03-25 NOTE — Discharge Instructions (Signed)
It is not clear what triggered the seizure you have. Your CT scan was negative. Your blood test looked okay. You may need an MRI. Discussed this with your primary physician. They may opt to have you follow-up with neurologist as well. Do not drive until you are medically cleared by your physician or neurologist. Do not take care of children without another adult nearby until cleared as.  If you have any further seizure activity, or if you have any other urgent concerns, return to the emergency department.  Seizure, Adult A seizure is abnormal electrical activity in the brain. Seizures usually last from 30 seconds to 2 minutes. There are various types of seizures. Before a seizure, you may have a warning sensation (aura) that a seizure is about to occur. An aura may include the following symptoms:   Fear or anxiety.  Nausea.  Feeling like the room is spinning (vertigo).  Vision changes, such as seeing flashing lights or spots. Common symptoms during a seizure include:  A change in attention or behavior (altered mental status).  Convulsions with rhythmic jerking movements.  Drooling.  Rapid eye movements.  Grunting.  Loss of bladder and bowel control.  Bitter taste in the mouth.  Tongue biting. After a seizure, you may feel confused and sleepy. You may also have an injury resulting from convulsions during the seizure. HOME CARE INSTRUCTIONS   If you are given medicines, take them exactly as prescribed by your health care provider.  Keep all follow-up appointments as directed by your health care provider.  Do not swim or drive or engage in risky activity during which a seizure could cause further injury to you or others until your health care provider says it is OK.  Get adequate rest.  Teach friends and family what to do if you have a seizure. They should:  Lay you on the ground to prevent a fall.  Put a cushion under your head.  Loosen any tight clothing around your  neck.  Turn you on your side. If vomiting occurs, this helps keep your airway clear.  Stay with you until you recover.  Know whether or not you need emergency care. SEEK IMMEDIATE MEDICAL CARE IF:  The seizure lasts longer than 5 minutes.  The seizure is severe or you do not wake up immediately after the seizure.  You have an altered mental status after the seizure.  You are having more frequent or worsening seizures. Someone should drive you to the emergency department or call local emergency services (911 in [place] MAKE SURE YOU:  Understand these instructions.  Will watch your condition.  Will get help right away if you are not doing well or get worse.   This information is not intended to replace advice given to you by your health care provider. Make sure you discuss any questions you have with your health care provider.   Document Released: 05/23/2000 Document Revised: 06/16/2014 Document Reviewed: 01/05/2013 Elsevier Interactive Patient Education [employer].

## 2015-03-25 NOTE — ED Notes (Signed)
Pt bite tongue

## 2015-03-25 NOTE — ED Notes (Signed)
Pt v/s within normal range, pt IV removed, patient given discharge instructions

## 2015-03-25 NOTE — ED Notes (Signed)
Pt in CT.

## 2015-03-25 NOTE — ED Notes (Signed)
Pt to ED via EMS from home with onset of seizure this afternoon witnessed by family. Per EMS pt was sitting in chair when became unresponsive, upper extremity shaking, and drooling. Per EMS pt was postictal on arrival, does not remember what happened. Pt does not have hx of seizures, AAOx3 on arrival. Pt c/c of headache, 7/10. Pt has tongue trauma noted, bleeding controlled, airway in tact. Vitals stable at this time, no acute distress noted.

## 2015-03-26 ENCOUNTER — Telehealth: Payer: Self-pay | Admitting: Internal Medicine

## 2015-03-26 NOTE — Telephone Encounter (Signed)
Neither of those work for me .  4:30 tomorrow

## 2015-03-26 NOTE — Telephone Encounter (Signed)
Please advise 

## 2015-03-26 NOTE — Telephone Encounter (Signed)
Ok! appt is scheduled for 10/18 '@4'$ :30p. Thanks

## 2015-03-26 NOTE — Telephone Encounter (Signed)
Please see message below

## 2015-03-26 NOTE — Telephone Encounter (Signed)
Pt called about having to go to the ER yesterday, Pt had a seizure.Pt needs a follow up appt. No appt avail showing. Pt does not want to see another provider. Please let me know where I can schedule. Thursday would be good after 12 if possible or today.Thank You!

## 2015-03-27 ENCOUNTER — Ambulatory Visit (INDEPENDENT_AMBULATORY_CARE_PROVIDER_SITE_OTHER): Payer: BLUE CROSS/BLUE SHIELD | Admitting: Internal Medicine

## 2015-03-27 ENCOUNTER — Encounter: Payer: Self-pay | Admitting: Internal Medicine

## 2015-03-27 VITALS — BP 146/80 | HR 70 | Temp 98.6°F | Resp 14 | Ht 61.0 in | Wt 157.2 lb

## 2015-03-27 DIAGNOSIS — Z23 Encounter for immunization: Secondary | ICD-10-CM | POA: Diagnosis not present

## 2015-03-27 DIAGNOSIS — Z8669 Personal history of other diseases of the nervous system and sense organs: Secondary | ICD-10-CM | POA: Diagnosis not present

## 2015-03-27 DIAGNOSIS — G40909 Epilepsy, unspecified, not intractable, without status epilepticus: Secondary | ICD-10-CM

## 2015-03-27 MED ORDER — ALPRAZOLAM 0.5 MG PO TABS: 0.5000 mg | ORAL_TABLET | Freq: Every evening | ORAL | Status: DC | PRN

## 2015-03-27 NOTE — Progress Notes (Signed)
Subjective:  Patient ID: Lorraine Mills, female    DOB: [birth year]  Age: 64 y.o. MRN: [id number]  CC: The primary encounter diagnosis was Encounter for immunization. Diagnoses of History of tonic-clonic seizures and Seizure disorder [facility]) were also pertinent to this visit.  HPI NHUNG DANKO presents for ER follow up for new onset seizure.  Occurred after feeling off balance after getting up from the commode. Sat down to let it pass,  startig texting her daughter,  Then remembers nothing,  Family noted tonic clonic seizure activity  , then loss of upright posture and slid to floor.  911 called and patient was taken to ER with no additional seizure activity noted.  Reported a post ictal headache.  CT head was nondiagnostic and she was given Ativan IV and sent home.  She has a history of migraines but has had none in quite a while.  At least 4 years.     Outpatient Prescriptions Prior to Visit  Medication Sig Dispense Refill  . escitalopram (LEXAPRO) 20 MG tablet Take 1 tablet (20 mg total) by mouth daily. 90 tablet 1  . fenofibrate (TRICOR) 48 MG tablet TAKE ONE TABLET BY MOUTH EVERY DAY 90 tablet 1  . fexofenadine-pseudoephedrine (ALLEGRA-D 24) 180-240 MG per 24 hr tablet Take 1 tablet by mouth daily.    . fluticasone (FLONASE) 50 MCG/ACT nasal spray Place 2 sprays into the nose daily.    Marland Kitchen levothyroxine (SYNTHROID, LEVOTHROID) 75 MCG tablet Take 1 tablet (75 mcg total) by mouth daily. 90 tablet 1  . prednisoLONE acetate (PRED FORTE) 1 % ophthalmic suspension Place 1 drop into the right eye 4 (four) times daily.    Marland Kitchen zolpidem (AMBIEN CR) 6.25 MG CR tablet Take 1 tablet (6.25 mg total) by mouth at bedtime as needed. 90 tablet 1  . ALPRAZolam (XANAX) 0.5 MG tablet Take 1 tablet (0.5 mg total) by mouth at bedtime as needed for anxiety. 30 tablet 5   No facility-administered medications prior to visit.    Review of Systems;  Patient denies headache, fevers, malaise, unintentional weight loss,  skin rash, eye pain, sinus congestion and sinus pain, sore throat, dysphagia,  hemoptysis , cough, dyspnea, wheezing, chest pain, palpitations, orthopnea, edema, abdominal pain, nausea, melena, diarrhea, constipation, flank pain, dysuria, hematuria, urinary  Frequency, nocturia, numbness, tingling, seizures,  Focal weakness, Loss of consciousness,  Tremor, insomnia, depression, anxiety, and suicidal ideation.      Objective:  BP 146/80 mmHg  Pulse 70  Temp(Src) 98.6 F (37 C) (Oral)  Resp 14  Ht '5\' 1"'$  (1.549 m)  Wt 157 lb 4 oz (71.328 kg)  BMI 29.73 kg/m2  SpO2 98%  BP Readings from Last 3 Encounters:  03/27/15 146/80  03/25/15 117/59  12/14/14 112/70    Wt Readings from Last 3 Encounters:  03/27/15 157 lb 4 oz (71.328 kg)  03/25/15 155 lb (70.308 kg)  12/14/14 151 lb 2 oz (68.55 kg)    General appearance: alert, cooperative and appears stated age Ears: normal TM's and external ear canals both ears Throat: lips, mucosa, and tongue normal; teeth and gums normal Neck: no adenopathy, no carotid bruit, supple, symmetrical, trachea midline and thyroid not enlarged, symmetric, no tenderness/mass/nodules Back: symmetric, no curvature. ROM normal. No CVA tenderness. Lungs: clear to auscultation bilaterally Heart: regular rate and rhythm, S1, S2 normal, no murmur, click, rub or gallop Abdomen: soft, non-tender; bowel sounds normal; no masses,  no organomegaly Pulses: 2+ and symmetric Skin: Skin color, texture,  turgor normal. No rashes or lesions Lymph nodes: Cervical, supraclavicular, and axillary nodes normal.  No results found for: HGBA1C  Lab Results  Component Value Date   CREATININE 1.02* 03/25/2015   CREATININE 0.84 11/27/2014   CREATININE 0.9 05/25/2014    Lab Results  Component Value Date   WBC 6.7 03/25/2015   HGB 13.4 03/25/2015   HCT 38.6 03/25/2015   PLT 193 03/25/2015   GLUCOSE 85 03/25/2015   CHOL 223* 11/27/2014   TRIG 89.0 11/27/2014   HDL 49.90  11/27/2014   LDLDIRECT 153.0 10/07/2012   LDLCALC 155* 11/27/2014   ALT 12 11/27/2014   AST 16 11/27/2014   NA 140 03/25/2015   K 3.2* 03/25/2015   CL 107 03/25/2015   CREATININE 1.02* 03/25/2015   BUN 17 03/25/2015   CO2 22 03/25/2015   TSH 1.41 11/27/2014    Ct Head Wo Contrast  03/25/2015  CLINICAL DATA:  New onset seizure today. EXAM: CT HEAD WITHOUT CONTRAST TECHNIQUE: Contiguous axial images were obtained from the base of the skull through the vertex without intravenous contrast. COMPARISON:  None. FINDINGS: No evidence of intracranial hemorrhage, brain edema, or other signs of acute infarction. No evidence of intracranial mass lesion or mass effect. No abnormal extraaxial fluid collections identified. Ventricles are normal in size. No skull abnormality identified. IMPRESSION: Negative noncontrast head CT. Electronically Signed   By: Earle Gell M.D.   On: 03/25/2015 17:38    Assessment & Plan:   Problem List Items Addressed This Visit    Seizure disorder ([place])    CT head was normal, electrolytes were normal . The history suggests that she may have had a vasovagal drop in blood pressure leading to hypoperfusion  And seizure.  However, MRI of brain and Neurology evaluation are in process.         Other Visit Diagnoses    Encounter for immunization    -  Primary    History of tonic-clonic seizures        Relevant Orders    MR Brain W Wo Contrast    Ambulatory referral to Neurology       I am having Ms. Wooden maintain her fexofenadine-pseudoephedrine, fluticasone, prednisoLONE acetate, fenofibrate, zolpidem, levothyroxine, escitalopram, and ALPRAZolam.  Meds ordered this encounter  Medications  . ALPRAZolam (XANAX) 0.5 MG tablet    Sig: Take 1 tablet (0.5 mg total) by mouth at bedtime as needed for anxiety.    Dispense:  30 tablet    Refill:  5    Medications Discontinued During This Encounter  Medication Reason  . ALPRAZolam (XANAX) 0.5 MG tablet Reorder   A  total of 25 minutes of face to face time was spent with patient more than half of which was spent in counselling about the above mentioned conditions  and coordination of care  Follow-up: No Follow-up on file.   [name], MD

## 2015-03-27 NOTE — Patient Instructions (Signed)
MRI of your brain has been ordered  Neurolgy appt to be scheduled either with Dr Manuella Ghazi of [facility] or with one of [facility] neurologist in [place]

## 2015-03-28 DIAGNOSIS — G40909 Epilepsy, unspecified, not intractable, without status epilepticus: Secondary | ICD-10-CM | POA: Insufficient documentation

## 2015-03-28 NOTE — Assessment & Plan Note (Signed)
CT head was normal, electrolytes were normal . The history suggests that she may have had a vasovagal drop in blood pressure leading to hypoperfusion  And seizure.  However, MRI of brain and Neurology evaluation are in process.

## 2015-04-06 ENCOUNTER — Ambulatory Visit
Admission: RE | Admit: 2015-04-06 | Discharge: 2015-04-06 | Disposition: A | Discharge status: Home / Self Care | Payer: BLUE CROSS/BLUE SHIELD | Source: Ambulatory Visit | Attending: Internal Medicine | Admitting: Internal Medicine

## 2015-04-06 DIAGNOSIS — Z8669 Personal history of other diseases of the nervous system and sense organs: Secondary | ICD-10-CM

## 2015-04-06 DIAGNOSIS — M2548 Effusion, other site: Secondary | ICD-10-CM | POA: Insufficient documentation

## 2015-04-06 DIAGNOSIS — G4089 Other seizures: Secondary | ICD-10-CM | POA: Insufficient documentation

## 2015-04-06 MED ORDER — GADOBENATE DIMEGLUMINE 529 MG/ML IV SOLN
15.0000 mL | Freq: Once | INTRAVENOUS | Status: AC | PRN
  Administered 2015-04-06: 14 mL via INTRAVENOUS

## 2015-04-08 ENCOUNTER — Encounter: Payer: Self-pay | Admitting: Internal Medicine

## 2015-04-10 ENCOUNTER — Ambulatory Visit (INDEPENDENT_AMBULATORY_CARE_PROVIDER_SITE_OTHER): Payer: BLUE CROSS/BLUE SHIELD | Admitting: Neurology

## 2015-04-10 ENCOUNTER — Encounter: Payer: Self-pay | Admitting: Neurology

## 2015-04-10 VITALS — BP 120/62 | HR 69 | Ht 61.0 in | Wt 160.0 lb

## 2015-04-10 DIAGNOSIS — R569 Unspecified convulsions: Secondary | ICD-10-CM | POA: Diagnosis not present

## 2015-04-10 NOTE — Patient Instructions (Signed)
1. No driving for 6 months from March 25, 2015.

## 2015-04-10 NOTE — Progress Notes (Signed)
Lorraine Mills was seen today in neurologic consultation at the request of TULLO, [name], MD.   .This patient is accompanied in the office by her spouse who supplements the history.   The patient presents today for the evaluation of new-onset seizure.  I reviewed records are available to me.  The patient states that the event occurred on 03/25/2015.  She had just gone to the bathroom (urination) and had finished and stood up to wash her hands and began to feel somewhat dizzy and off balance.   She states that it was a normal "lightheaded" feeling but when she looked straight ahead she had a white spot in the visual field.  She had recognized that as aura as she had a remote hx of migraine.  She doesn't think she had a headache then.   She sat down thinking that that would help the dizziness, and that is the last thing that she remembers.  Her daughter said that she shook all over.  Her husband states that by the time he got there, the shaking was over.  It is unclear how long the event lasted.  She did bite her tongue.  She was somewhat confused afterwards.  No loss of bladder/bowel control.   She was taken to the emergency room and evaluated.  She had a CT of the brain in the emergency room that was unremarkable.  She had an MRI of the brain with and without gadolinium on 04/06/2015.  I had the opportunity to review this.  This was normal.  There was no evidence of hippocampal sclerosis.  No new medications or supplements, including OTC cold medications.  She has xanax on the list but states that she rarely takes it.  Husband states that she was in the mountains the previous weekend and slept well but she admits that "riding in the mountains" is stressful for her.  No family hx of seizure.  No personal hx of febrile seizure.  She states that since the seizure she hasn't felt like herself but cannot further describe that.  Admits that it may be due to the stress of the event.  Not driving  currently    ALLERGIES:   Allergies  Allergen Reactions  . Levofloxacin     FLUOROQUINOLONES     CURRENT MEDICATIONS:  Outpatient Encounter Prescriptions as of 04/10/2015  Medication Sig  . ALPRAZolam (XANAX) 0.5 MG tablet Take 1 tablet (0.5 mg total) by mouth at bedtime as needed for anxiety.  Marland Kitchen escitalopram (LEXAPRO) 20 MG tablet Take 1 tablet (20 mg total) by mouth daily.  . fenofibrate (TRICOR) 48 MG tablet TAKE ONE TABLET BY MOUTH EVERY DAY  . fexofenadine-pseudoephedrine (ALLEGRA-D 24) 180-240 MG per 24 hr tablet Take 1 tablet by mouth daily.  . fluticasone (FLONASE) 50 MCG/ACT nasal spray Place 2 sprays into the nose daily.  Marland Kitchen levothyroxine (SYNTHROID, LEVOTHROID) 75 MCG tablet Take 1 tablet (75 mcg total) by mouth daily.  . prednisoLONE acetate (PRED FORTE) 1 % ophthalmic suspension Place 1 drop into the right eye 4 (four) times daily.  Marland Kitchen zolpidem (AMBIEN CR) 6.25 MG CR tablet Take 1 tablet (6.25 mg total) by mouth at bedtime as needed.   No facility-administered encounter medications on file as of 04/10/2015.    PAST MEDICAL HISTORY:   Past Medical History  Diagnosis Date  . Goiter   . Seasonal allergies   . Anxiety     PAST SURGICAL HISTORY:   Past Surgical History  Procedure  Laterality Date  . Bilateral oophorectomy  2001    BENIGN TUMOR  . Abdominal hysterectomy      supracervical   . Corneal transplant Right   . Cataract extraction Right     SOCIAL HISTORY:   Social History   Social History  . Marital Status: Married    Spouse Name: N/A  . Number of Children: N/A  . Years of Education: N/A   Occupational History  . Not on file.   Social History Main Topics  . Smoking status: Former Smoker -- 24 years    Types: Cigarettes    Quit date: 05/09/1998  . Smokeless tobacco: Never Used  . Alcohol Use: 0.0 oz/week    0 Standard drinks or equivalent per week     Comment: once a week  . Drug Use: No  . Sexual Activity: Not on file   Other Topics  Concern  . Not on file   Social History Narrative    FAMILY HISTORY:   Family Status  Relation Status Death Age  . Sister Alive     breast cancer  . Mother Deceased     ovarian cancer  . Father Deceased     stroke  . Sister Alive     heart attack, DM  . Sister Alive     healthy  . Sister Alive     healthy  . Son Alive     healthy  . Daughter Alive     healthy    ROS:  A complete 10 system review of systems was obtained and was unremarkable apart from what is mentioned above.  PHYSICAL EXAMINATION:    VITALS:   Filed Vitals:   04/10/15 0847  BP: 120/62  Pulse: 69  Height: '5\' 1"'$  (1.549 m)  Weight: 160 lb (72.576 kg)    GEN:  Normal appears female in no acute distress.  Appears stated age. HEENT:  Normocephalic, atraumatic. The mucous membranes are moist. The superficial temporal arteries are without ropiness or tenderness. Cardiovascular: Regular rate and rhythm. Lungs: Clear to auscultation bilaterally. Neck/Heme: There are no carotid bruits noted bilaterally.  NEUROLOGICAL: Orientation:  The patient is alert and oriented x 3.  Fund of knowledge is appropriate.  Recent and remote memory intact.  Attention span and concentration normal.  Repeats and names without difficulty. Cranial nerves: There is good facial symmetry. The pupils are equal round and reactive to light bilaterally. Fundoscopic exam is attempted but the disc margins are not well visualized bilaterally. Extraocular muscles are intact and visual fields are full to confrontational testing. Speech is fluent and clear. Soft palate rises symmetrically and there is no tongue deviation. Hearing is intact to conversational tone. Tone: Tone is good throughout. Sensation: Sensation is intact to light touch and pinprick throughout (facial, trunk, extremities). Vibration is intact at the bilateral big toe. There is no extinction with double simultaneous stimulation. There is no sensory dermatomal level  identified. Coordination:  The patient has no difficulty with RAM's or FNF bilaterally. Motor: Strength is 5/5 in the bilateral upper and lower extremities.  Shoulder shrug is equal and symmetric. There is no pronator drift.  There are no fasciculations noted. DTR's: Deep tendon reflexes are 2+/4 at the bilateral biceps, triceps, brachioradialis, patella and achilles.  Plantar responses are downgoing bilaterally. Gait and Station: The patient is able to ambulate without difficulty. The patient is able to heel toe walk without any difficulty. The patient is able to ambulate in a tandem fashion. The patient  is able to stand in the Romberg position.   IMPRESSION/PLAN  1. New-onset seizure  -I had a long discussion with the patient and her family.  I think she needs an EEG and if that is unremarkable, she will have an ambulatory EEG.  If the EEGs are negative, then I would recommend holding any antiepileptic medications.  Generally, antiepileptic medications are not given with a single seizure in the face of a normal EEG/normal MRI.  If, however, the events were to reoccur, then she would likely need medication.  She does need to follow seizure and safety.  She should not be driving for 6 months.  We discussed other aspects of seizure and safety as well.  Understanding was expressed.  Much greater than 50% of this visit was spent in counseling with the patient and the family.  Total face to face time:  60 min

## 2015-04-19 ENCOUNTER — Telehealth: Payer: Self-pay | Admitting: Neurology

## 2015-04-19 ENCOUNTER — Ambulatory Visit (INDEPENDENT_AMBULATORY_CARE_PROVIDER_SITE_OTHER): Payer: BLUE CROSS/BLUE SHIELD | Admitting: Neurology

## 2015-04-19 DIAGNOSIS — R569 Unspecified convulsions: Secondary | ICD-10-CM | POA: Diagnosis not present

## 2015-04-19 NOTE — Procedures (Signed)
TECHNICAL SUMMARY:  A multi channel referential and bipolar montage EEG using the standard international 10-20 system was performed on the patient described as awake, drowsy and asleep.  The dominant background activity consists of 10 hertz activity seen most prominantly over the anterior head region.  The backgound activity is reactive to eye opening and closing procedures.  Low voltage fast (beta) activity is distributed symmetrically and maximally over the anterior head regions.  ACTIVATION:  Stepwise photic stimulation at 4-20 flashes per second was performed and did not elicit any abnormal waveforms.  Hyperventilation was performed for 3 minutes with good patient effort and produced no changes in the background activity.  EPILEPTIFORM ACTIVITY:  There were no spikes, sharp waves or paroxysmal activity.  SLEEP:  Stage 1 and 2 sleep were both noted.  CARDIAC:  The EKG lead revealed a normal sinus rhythm.  IMPRESSION:  This is a normal EEG for the patients stated age.  There were no focal, hemispheric or lateralizing features.  No epileptiform activity was recorded.  A normal EEG does not exclude a seizure disorder and if clinically indicated, an ambulatory EEG may be of value.

## 2015-04-19 NOTE — Telephone Encounter (Signed)
Patient made aware EEG normal. She has Ambulatory test scheduled.

## 2015-04-19 NOTE — Telephone Encounter (Signed)
Left message on machine for patient to call back.

## 2015-04-19 NOTE — Telephone Encounter (Signed)
-----   Message from Energy, DO sent at 04/19/2015 12:57 PM EST ----- Let pt know that routine EEG normal and she will need to schedule ambulatory

## 2015-05-09 ENCOUNTER — Ambulatory Visit (INDEPENDENT_AMBULATORY_CARE_PROVIDER_SITE_OTHER): Payer: BLUE CROSS/BLUE SHIELD | Admitting: Neurology

## 2015-05-09 DIAGNOSIS — R569 Unspecified convulsions: Secondary | ICD-10-CM | POA: Diagnosis not present

## 2015-05-18 ENCOUNTER — Telehealth: Payer: Self-pay | Admitting: Neurology

## 2015-05-18 NOTE — Telephone Encounter (Signed)
To called to get EEG results/[phone]

## 2015-05-18 NOTE — Procedures (Signed)
ELECTROENCEPHALOGRAM REPORT  Dates of Recording: 05/09/2015 to 05/10/2015  Patient's Name: Lorraine Mills MRN: [id number] Date of Birth: 02/08/1951  Referring Provider: Dr. Wells Guiles Tat  Procedure: 24-hour ambulatory EEG  History: This is a 64 year old woman with new onset seizure 03/25/15 with an aura prior to event, shaking, tongue biting and then post-ictal confusional state. EEG for classification.  Medications: Xanax, Lexapro, Tricor, Allegra-D 24, Flonase, Levothyroxine, Ambien  Technical Summary: This is a 24-hour multichannel digital EEG recording measured by the international 10-20 system with electrodes applied with paste and impedances below 5000 ohms performed as portable with EKG monitoring.  The digital EEG was referentially recorded, reformatted, and digitally filtered in a variety of bipolar and referential montages for optimal display.    DESCRIPTION OF RECORDING: During maximal wakefulness, the background activity consisted of a symmetric 9.5 Hz posterior dominant rhythm which was reactive to eye opening.  There were no epileptiform discharges or focal slowing seen in wakefulness.  During the recording, the patient progresses through wakefulness, drowsiness, and Stage 2 sleep.  Again, there were no epileptiform discharges seen.  Events: There were no push button events.  There were no electrographic seizures seen.  EKG lead was unremarkable.  IMPRESSION: This 24-hour ambulatory EEG study is normal.    CLINICAL CORRELATION: A normal EEG does not exclude a clinical diagnosis of epilepsy. Typical events were not captured. If further clinical questions remain, inpatient video EEG monitoring may be helpful.   Ellouise Newer, M.D.

## 2015-05-18 NOTE — Telephone Encounter (Signed)
Her 24-hour EEG is normal. Thanks

## 2015-05-18 NOTE — Telephone Encounter (Signed)
Patient made aware. She knows we will call when we have results.

## 2015-05-18 NOTE — Telephone Encounter (Signed)
My suspicion is that its not been read yet as Dr. Delice Lesch has been out of the office

## 2015-05-18 NOTE — Telephone Encounter (Signed)
Patient made aware AMB EEG normal.

## 2015-05-18 NOTE — Telephone Encounter (Signed)
Patient asking for Ambulatory EEG results. Don't see them in EPIC.

## 2015-05-18 NOTE — Telephone Encounter (Signed)
Patient aware she can not drive for 6 months from seizure.

## 2015-06-05 ENCOUNTER — Other Ambulatory Visit: Payer: Self-pay | Admitting: Internal Medicine

## 2015-06-21 ENCOUNTER — Ambulatory Visit: Payer: BLUE CROSS/BLUE SHIELD | Admitting: Internal Medicine

## 2015-07-03 ENCOUNTER — Other Ambulatory Visit: Payer: Self-pay | Admitting: Internal Medicine

## 2015-07-12 ENCOUNTER — Other Ambulatory Visit: Payer: Self-pay | Admitting: Internal Medicine

## 2015-12-05 ENCOUNTER — Other Ambulatory Visit: Payer: Self-pay | Admitting: Internal Medicine

## 2015-12-06 NOTE — Telephone Encounter (Signed)
No liver function or lipid since 10/16?

## 2015-12-07 NOTE — Telephone Encounter (Signed)
Left message to call office patient needs appointment for labs and follow up.

## 2016-01-14 ENCOUNTER — Other Ambulatory Visit: Payer: Self-pay | Admitting: Internal Medicine

## 2016-01-17 ENCOUNTER — Other Ambulatory Visit: Payer: Self-pay | Admitting: Internal Medicine

## 2016-01-29 ENCOUNTER — Other Ambulatory Visit: Payer: Self-pay | Admitting: Internal Medicine

## 2016-01-29 ENCOUNTER — Ambulatory Visit (INDEPENDENT_AMBULATORY_CARE_PROVIDER_SITE_OTHER): Payer: BLUE CROSS/BLUE SHIELD | Admitting: Internal Medicine

## 2016-01-29 ENCOUNTER — Encounter: Payer: Self-pay | Admitting: Internal Medicine

## 2016-01-29 VITALS — BP 126/78 | HR 71 | Temp 98.2°F | Resp 10 | Ht 61.0 in | Wt 168.8 lb

## 2016-01-29 DIAGNOSIS — Z113 Encounter for screening for infections with a predominantly sexual mode of transmission: Secondary | ICD-10-CM | POA: Diagnosis not present

## 2016-01-29 DIAGNOSIS — Z1239 Encounter for other screening for malignant neoplasm of breast: Secondary | ICD-10-CM

## 2016-01-29 DIAGNOSIS — F32A Depression, unspecified: Secondary | ICD-10-CM

## 2016-01-29 DIAGNOSIS — E038 Other specified hypothyroidism: Secondary | ICD-10-CM

## 2016-01-29 DIAGNOSIS — Z90721 Acquired absence of ovaries, unilateral: Secondary | ICD-10-CM

## 2016-01-29 DIAGNOSIS — Z1211 Encounter for screening for malignant neoplasm of colon: Secondary | ICD-10-CM

## 2016-01-29 DIAGNOSIS — G40909 Epilepsy, unspecified, not intractable, without status epilepticus: Secondary | ICD-10-CM

## 2016-01-29 DIAGNOSIS — R635 Abnormal weight gain: Secondary | ICD-10-CM

## 2016-01-29 DIAGNOSIS — F329 Major depressive disorder, single episode, unspecified: Secondary | ICD-10-CM

## 2016-01-29 DIAGNOSIS — Z0000 Encounter for general adult medical examination without abnormal findings: Secondary | ICD-10-CM | POA: Diagnosis not present

## 2016-01-29 DIAGNOSIS — E784 Other hyperlipidemia: Secondary | ICD-10-CM | POA: Diagnosis not present

## 2016-01-29 DIAGNOSIS — F330 Major depressive disorder, recurrent, mild: Secondary | ICD-10-CM | POA: Diagnosis not present

## 2016-01-29 DIAGNOSIS — R5383 Other fatigue: Secondary | ICD-10-CM | POA: Diagnosis not present

## 2016-01-29 DIAGNOSIS — D126 Benign neoplasm of colon, unspecified: Secondary | ICD-10-CM

## 2016-01-29 DIAGNOSIS — E034 Atrophy of thyroid (acquired): Secondary | ICD-10-CM

## 2016-01-29 DIAGNOSIS — Z90710 Acquired absence of both cervix and uterus: Secondary | ICD-10-CM

## 2016-01-29 DIAGNOSIS — E785 Hyperlipidemia, unspecified: Secondary | ICD-10-CM

## 2016-01-29 LAB — LIPID PANEL
CHOL/HDL RATIO: 6
CHOLESTEROL: 259 mg/dL — AB (ref 0–200)
HDL: 41.2 mg/dL (ref >39.00)
NonHDL: 217.57
TRIGLYCERIDES: 229 mg/dL — AB (ref 0.0–149.0)
VLDL: 45.8 mg/dL — AB (ref 0.0–40.0)

## 2016-01-29 LAB — TSH: TSH: 1.54 u[IU]/mL (ref 0.35–4.50)

## 2016-01-29 LAB — CBC WITH DIFFERENTIAL/PLATELET
BASOS ABS: 0 10*3/uL (ref 0.0–0.1)
Basophils Relative: 0.6 % (ref 0.0–3.0)
EOS ABS: 0.1 10*3/uL (ref 0.0–0.7)
Eosinophils Relative: 1.1 % (ref 0.0–5.0)
HCT: 41.6 % (ref 36.0–46.0)
Hemoglobin: 14.3 g/dL (ref 12.0–15.0)
LYMPHS ABS: 1.9 10*3/uL (ref 0.7–4.0)
Lymphocytes Relative: 30.9 % (ref 12.0–46.0)
MCHC: 34.3 g/dL (ref 30.0–36.0)
MCV: 93.7 fl (ref 78.0–100.0)
MONO ABS: 0.5 10*3/uL (ref 0.1–1.0)
MONOS PCT: 8.1 % (ref 3.0–12.0)
NEUTROS ABS: 3.6 10*3/uL (ref 1.4–7.7)
NEUTROS PCT: 59.3 % (ref 43.0–77.0)
PLATELETS: 215 10*3/uL (ref 150.0–400.0)
RBC: 4.44 Mil/uL (ref 3.87–5.11)
RDW: 12.7 % (ref 11.5–15.5)
WBC: 6.1 10*3/uL (ref 4.0–10.5)

## 2016-01-29 LAB — LDL CHOLESTEROL, DIRECT: LDL DIRECT: 141 mg/dL

## 2016-01-29 MED ORDER — MELOXICAM 15 MG PO TABS: 15.0000 mg | ORAL_TABLET | Freq: Every day | ORAL | 4 refills | Status: DC

## 2016-01-29 NOTE — Progress Notes (Signed)
Patient ID: Lorraine Mills, female    DOB: 08/30/1950  Age: 65 y.o. MRN: [id number]  The patient is here for her annual Comprehensive physical examination and management of other chronic and acute problems.   She is several years overdue for mammogram (last one 2014)  S/p supracervical hyst so she is due for PAP s, Hep C and HIV screen Last Colonoscopy was in 2011, tubular adenoma,  Repeat was due in 2016,  Lorraine Mills   The risk factors are reflected in the social history.  The roster of all physicians providing medical care to patient - is listed in the Snapshot section of the chart.  Home safety : The patient has smoke detectors in the home. They wear seatbelts.  There are no firearms at home. There is no violence in the home.   There is no risks for hepatitis, STDs or HIV. There is no   history of blood transfusion. They have no travel history to infectious disease endemic areas of the world.  The patient has seen their dentist in the last six month. They have seen their eye doctor in the last year.   They do not  have excessive sun exposure. Discussed the need for sun protection: hats, long sleeves and use of sunscreen if there is significant sun exposure.   Diet: the importance of a healthy diet is discussed. She has a healthy diet but is eating too much .  The benefits of regular aerobic exercise were discussed. She does not exercise regularly.   Depression screen: there are multiple signs or vegative symptoms of depression: overeating , anhedonia, increased sleeping, lack of motivation, and social isolation .  The following portions of the patient's history were reviewed and updated as appropriate: allergies, current medications, past family history, past medical history,  past surgical history, past social history  and problem list.  Visual acuity was not assessed per patient preference since she has regular follow up with her ophthalmologist. Hearing and body mass index were assessed  and reviewed.   During the course of the visit the patient was educated and counseled about appropriate screening and preventive services including : fall prevention , diabetes screening, nutrition counseling, colorectal cancer screening, and recommended immunizations.    CC: The primary encounter diagnosis was Screening for breast cancer. Diagnoses of Mild episode of recurrent major depressive disorder ([place]), Colon cancer screening, Weight gain, Fatigue due to depression, Screen for STD (sexually transmitted disease), Tubular adenoma of colon, Seizure disorder [facility]), Routine general medical examination at a health care facility, Hypothyroidism due to acquired atrophy of thyroid, Dyslipidemia (high LDL; low HDL), and Depression were also pertinent to this visit.  Depression:  She was last seen after seizure episode in October.  Since then has given up on herself.  She has been inactive since retiring in 2012,  watches her grandson 2-3 days per week,  Other days she just cleans her house over and over .  Going through the motions of cleaning  But does not feel her house is clean.    No longer using sedatives for insomnia.  Feels like she could stay in bed all day.[name],  Knows she needs to lose 60 lbs.  Eating 3 meals daily .  Diet reviewed:  Fruit and yogurt for breakfast, lunch is a sandwich  Dinner is variable , does not eat out, but eats  lots of premade meals that are high in starch.   Sister has retired and she leaves the house to shop  and travel with her, even though she doesn't want to go.  Feels happy in her own home.  Not agorophobic,  Just prefers to stay home.  History of panic attacks    Left Knee pain .  Prior history of menscal tears,  Aggravated by trip to [place] where she did a lot of walking. Referred to [facility],  Who treated her with  meloxicam 30 days, which relieved her pain.  PT was advised and she did it on her own but now has persistent pain in knee.     History Lorraine Mills has a past medical history of Anxiety; Goiter; Hyperlipidemia; and Seasonal allergies.   She has a past surgical history that includes Bilateral oophorectomy (2001); Abdominal hysterectomy; Corneal transplant (Right); and Cataract extraction (Right).   Her family history includes Cancer in her mother and sister; Stroke in her father; Thyroid disease in her other.She reports that she quit smoking about 17 years ago. Her smoking use included Cigarettes. She quit after 24.00 years of use. She has never used smokeless tobacco. She reports that she drinks alcohol. She reports that she does not use drugs.  Outpatient Medications Prior to Visit  Medication Sig Dispense Refill  . escitalopram (LEXAPRO) 20 MG tablet TAKE ONE TABLET BY MOUTH EVERY DAY 90 tablet 0  . fenofibrate (TRICOR) 48 MG tablet Take 1 tablet (48 mg total) by mouth daily. Pt must be seen for further refills 90 tablet 0  . fexofenadine-pseudoephedrine (ALLEGRA-D 24) 180-240 MG per 24 hr tablet Take 1 tablet by mouth daily.    . fluticasone (FLONASE) 50 MCG/ACT nasal spray Place 2 sprays into the nose daily.    Marland Kitchen levothyroxine (SYNTHROID, LEVOTHROID) 75 MCG tablet TAKE ONE TABLET BY MOUTH EVERY DAY 90 tablet 2  . prednisoLONE acetate (PRED FORTE) 1 % ophthalmic suspension Place 1 drop into the right eye 4 (four) times daily.    Marland Kitchen ALPRAZolam (XANAX) 0.5 MG tablet Take 1 tablet (0.5 mg total) by mouth at bedtime as needed for anxiety. 30 tablet 5  . zolpidem (AMBIEN CR) 6.25 MG CR tablet Take 1 tablet (6.25 mg total) by mouth at bedtime as needed. 90 tablet 1   No facility-administered medications prior to visit.     Review of Systems  Objective:  BP 126/78   Pulse 71   Temp 98.2 F (36.8 C) (Oral)   Resp 10   Ht '5\' 1"'$  (1.549 m)   Wt 168 lb 12 oz (76.5 kg)   SpO2 97%   BMI 31.89 kg/m   Physical Exam    Assessment & Plan:   Problem List Items Addressed This Visit    Hypothyroidism    Thyroid function is  WNL on current dose.  No current changes needed.   Lab Results  Component Value Date   TSH 1.54 01/29/2016         Routine general medical examination at a health care facility    Annual comprehensive preventive exam was done as well as an evaluation and management of chronic conditions .  During the course of the visit the patient was educated and counseled about appropriate screening and preventive services including :  diabetes screening, lipid analysis with projected  10 year  risk for CAD , nutrition counseling, breast, cervical and colorectal cancer screening, and recommended immunizations.  Printed recommendations for health maintenance screenings was given      Dyslipidemia (high LDL; low HDL)    Based on current lipid profile, the risk of  clinically significant Coronary artery disease is 12% over the next 10 years, using the Framingham risk calculator.  Will recommend statin therapy and aspirin. Prior use of fenofibrate.  Lab Results  Component Value Date   CHOL 259 (H) 01/29/2016   HDL 41.20 01/29/2016   LDLCALC 155 (H) 11/27/2014   LDLDIRECT 141.0 01/29/2016   TRIG 229.0 (H) 01/29/2016   CHOLHDL 6 01/29/2016          Seizure disorder (HCC)    Witnessed,  With normla CT head,  Normal EEG ,  No recurrence.       Tubular adenoma of colon    found by 011 colonoscopy Vira [facility]).  Referral to Dr Bary Castilla for follow up due last year.       Depression    Discussed initiation of therapy; but she would prefer to see a psychiatrist. She is not suicidal       Other Visit Diagnoses    Screening for breast cancer    -  Primary   Relevant Orders   MM DIGITAL SCREENING BILATERAL   Mild episode of recurrent major depressive disorder ([place])       Relevant Orders   Ambulatory referral to Psychiatry   Colon cancer screening       Relevant Orders   Ambulatory referral to General Surgery   Weight gain       Relevant Orders   TSH (Completed)   Lipid panel (Completed)    Fatigue due to depression       Relevant Orders   Comprehensive metabolic panel   CBC with Differential/Platelet (Completed)   Screen for STD (sexually transmitted disease)       Relevant Orders   Hepatitis C antibody (Completed)   HIV antibody (Completed)      I have discontinued Ms. Zumbro zolpidem and ALPRAZolam. I am also having her start on meloxicam. Additionally, I am having her maintain her fexofenadine-pseudoephedrine, fluticasone, prednisoLONE acetate, levothyroxine, escitalopram, and fenofibrate.  Meds ordered this encounter  Medications  . meloxicam (MOBIC) 15 MG tablet    Sig: Take 1 tablet (15 mg total) by mouth daily.    Dispense:  30 tablet    Refill:  4    Medications Discontinued During This Encounter  Medication Reason  . zolpidem (AMBIEN CR) 6.25 MG CR tablet   . ALPRAZolam (XANAX) 0.5 MG tablet     Follow-up: No Follow-up on file.   [name], MD

## 2016-01-29 NOTE — Progress Notes (Signed)
Pre-visit discussion using our clinic review tool. No additional management support is needed unless otherwise documented below in the visit note.  

## 2016-01-29 NOTE — Patient Instructions (Addendum)
Resume daily meloxicam.  Add tyenol 500 mg every 6 hours or 1000 mg twice daily,  Max dose 2000 mg  Agree with resuming PT for left knee.   Psychiatry  Referral mammogram and GI referral to [facility] in process   Menopause is a normal process in which your reproductive ability comes to an end. This process happens gradually over a span of months to years, usually between the ages of 49 and 58. Menopause is complete when you have missed 12 consecutive menstrual periods. It is important to talk with your health care provider about some of the most common conditions that affect postmenopausal women, such as heart disease, cancer, and bone loss (osteoporosis). Adopting a healthy lifestyle and getting preventive care can help to promote your health and wellness. Those actions can also lower your chances of developing some of these common conditions. WHAT SHOULD I KNOW ABOUT MENOPAUSE? During menopause, you may experience a number of symptoms, such as:  Moderate-to-severe hot flashes.  Night sweats.  Decrease in sex drive.  Mood swings.  Headaches.  Tiredness.  Irritability.  Memory problems.  Insomnia. Choosing to treat or not to treat menopausal changes is an individual decision that you make with your health care provider. WHAT SHOULD I KNOW ABOUT HORMONE REPLACEMENT THERAPY AND SUPPLEMENTS? Hormone therapy products are effective for treating symptoms that are associated with menopause, such as hot flashes and night sweats. Hormone replacement carries certain risks, especially as you become older. If you are thinking about using estrogen or estrogen with progestin treatments, discuss the benefits and risks with your health care provider. WHAT SHOULD I KNOW ABOUT HEART DISEASE AND STROKE? Heart disease, heart attack, and stroke become more likely as you age. This may be due, in part, to the hormonal changes that your body experiences during menopause. These can affect how your body  processes dietary fats, triglycerides, and cholesterol. Heart attack and stroke are both medical emergencies. There are many things that you can do to help prevent heart disease and stroke:  Have your blood pressure checked at least every 1-2 years. High blood pressure causes heart disease and increases the risk of stroke.  If you are 10-1 years old, ask your health care provider if you should take aspirin to prevent a heart attack or a stroke.  Do not use any tobacco products, including cigarettes, chewing tobacco, or electronic cigarettes. If you need help quitting, ask your health care provider.  It is important to eat a healthy diet and maintain a healthy weight.  Be sure to include plenty of vegetables, fruits, low-fat dairy products, and lean protein.  Avoid eating foods that are high in solid fats, added sugars, or salt (sodium).  Get regular exercise. This is one of the most important things that you can do for your health.  Try to exercise for at least 150 minutes each week. The type of exercise that you do should increase your heart rate and make you sweat. This is known as moderate-intensity exercise.  Try to do strengthening exercises at least twice each week. Do these in addition to the moderate-intensity exercise.  Know your numbers.Ask your health care provider to check your cholesterol and your blood glucose. Continue to have your blood tested as directed by your health care provider. WHAT SHOULD I KNOW ABOUT CANCER SCREENING? There are several types of cancer. Take the following steps to reduce your risk and to catch any cancer development as early as possible. Breast Cancer  Practice breast self-awareness.  This means understanding how your breasts normally appear and feel.  It also means doing regular breast self-exams. Let your health care provider know about any changes, no matter how small.  If you are 83 or older, have a clinician do a breast exam (clinical  breast exam or CBE) every year. Depending on your age, family history, and medical history, it may be recommended that you also have a yearly breast X-ray (mammogram).  If you have a family history of breast cancer, talk with your health care provider about genetic screening.  If you are at high risk for breast cancer, talk with your health care provider about having an MRI and a mammogram every year.  Breast cancer (BRCA) gene test is recommended for women who have family members with BRCA-related cancers. Results of the assessment will determine the need for genetic counseling and BRCA1 and for BRCA2 testing. BRCA-related cancers include these types:  Breast. This occurs in males or females.  Ovarian.  Tubal. This may also be called fallopian tube cancer.  Cancer of the abdominal or pelvic lining (peritoneal cancer).  Prostate.  Pancreatic. Cervical, Uterine, and Ovarian Cancer Your health care provider may recommend that you be screened regularly for cancer of the pelvic organs. These include your ovaries, uterus, and vagina. This screening involves a pelvic exam, which includes checking for microscopic changes to the surface of your cervix (Pap test).  For women ages 21-65, health care providers may recommend a pelvic exam and a Pap test every three years. For women ages 76-65, they may recommend the Pap test and pelvic exam, combined with testing for human papilloma virus (HPV), every five years. Some types of HPV increase your risk of cervical cancer. Testing for HPV may also be done on women of any age who have unclear Pap test results.  Other health care providers may not recommend any screening for nonpregnant women who are considered low risk for pelvic cancer and have no symptoms. Ask your health care provider if a screening pelvic exam is right for you.  If you have had past treatment for cervical cancer or a condition that could lead to cancer, you need Pap tests and screening  for cancer for at least 20 years after your treatment. If Pap tests have been discontinued for you, your risk factors (such as having a new sexual partner) need to be reassessed to determine if you should start having screenings again. Some women have medical problems that increase the chance of getting cervical cancer. In these cases, your health care provider may recommend that you have screening and Pap tests more often.  If you have a family history of uterine cancer or ovarian cancer, talk with your health care provider about genetic screening.  If you have vaginal bleeding after reaching menopause, tell your health care provider.  There are currently no reliable tests available to screen for ovarian cancer. Lung Cancer Lung cancer screening is recommended for adults 10-35 years old who are at high risk for lung cancer because of a history of smoking. A yearly low-dose CT scan of the lungs is recommended if you:  Currently smoke.  Have a history of at least 30 pack-years of smoking and you currently smoke or have quit within the past 15 years. A pack-year is smoking an average of one pack of cigarettes per day for one year. Yearly screening should:  Continue until it has been 15 years since you quit.  Stop if you develop a health problem  that would prevent you from having lung cancer treatment. Colorectal Cancer  This type of cancer can be detected and can often be prevented.  Routine colorectal cancer screening usually begins at age 50 and continues through age 61.  If you have risk factors for colon cancer, your health care provider may recommend that you be screened at an earlier age.  If you have a family history of colorectal cancer, talk with your health care provider about genetic screening.  Your health care provider may also recommend using home test kits to check for hidden blood in your stool.  A small camera at the end of a tube can be used to examine your colon  directly (sigmoidoscopy or colonoscopy). This is done to check for the earliest forms of colorectal cancer.  Direct examination of the colon should be repeated every 5-10 years until age 72. However, if early forms of precancerous polyps or small growths are found or if you have a family history or genetic risk for colorectal cancer, you may need to be screened more often. Skin Cancer  Check your skin from head to toe regularly.  Monitor any moles. Be sure to tell your health care provider:  About any new moles or changes in moles, especially if there is a change in a mole's shape or color.  If you have a mole that is larger than the size of a pencil eraser.  If any of your family members has a history of skin cancer, especially at a young age, talk with your health care provider about genetic screening.  Always use sunscreen. Apply sunscreen liberally and repeatedly throughout the day.  Whenever you are outside, protect yourself by wearing long sleeves, pants, a wide-brimmed hat, and sunglasses. WHAT SHOULD I KNOW ABOUT OSTEOPOROSIS? Osteoporosis is a condition in which bone destruction happens more quickly than new bone creation. After menopause, you may be at an increased risk for osteoporosis. To help prevent osteoporosis or the bone fractures that can happen because of osteoporosis, the following is recommended:  If you are 36-8 years old, get at least 1,000 mg of calcium and at least 600 mg of vitamin D per day.  If you are older than age 61 but younger than age 55, get at least 1,200 mg of calcium and at least 600 mg of vitamin D per day.  If you are older than age 9, get at least 1,200 mg of calcium and at least 800 mg of vitamin D per day. Smoking and excessive alcohol intake increase the risk of osteoporosis. Eat foods that are rich in calcium and vitamin D, and do weight-bearing exercises several times each week as directed by your health care provider. WHAT SHOULD I KNOW  ABOUT HOW MENOPAUSE AFFECTS [place]? Depression may occur at any age, but it is more common as you become older. Common symptoms of depression include:  Low or sad mood.  Changes in sleep patterns.  Changes in appetite or eating patterns.  Feeling an overall lack of motivation or enjoyment of activities that you previously enjoyed.  Frequent crying spells. Talk with your health care provider if you think that you are experiencing depression. WHAT SHOULD I KNOW ABOUT IMMUNIZATIONS? It is important that you get and maintain your immunizations. These include:  Tetanus, diphtheria, and pertussis (Tdap) booster vaccine.  Influenza every year before the flu season begins.  Pneumonia vaccine.  Shingles vaccine. Your health care provider may also recommend other immunizations.   This information is  not intended to replace advice given to you by your health care provider. Make sure you discuss any questions you have with your health care provider.   Document Released: 07/18/2005 Document Revised: 06/16/2014 Document Reviewed: 01/26/2014 Elsevier Interactive Patient Education [employer].

## 2016-01-30 ENCOUNTER — Encounter: Payer: Self-pay | Admitting: *Deleted

## 2016-01-30 LAB — HEPATITIS C ANTIBODY: HCV Ab: NEGATIVE

## 2016-01-30 LAB — HIV ANTIBODY (ROUTINE TESTING W REFLEX): HIV 1&2 Ab, 4th Generation: NONREACTIVE

## 2016-01-31 DIAGNOSIS — D126 Benign neoplasm of colon, unspecified: Secondary | ICD-10-CM | POA: Insufficient documentation

## 2016-01-31 DIAGNOSIS — F329 Major depressive disorder, single episode, unspecified: Secondary | ICD-10-CM | POA: Insufficient documentation

## 2016-01-31 DIAGNOSIS — F32A Depression, unspecified: Secondary | ICD-10-CM

## 2016-01-31 HISTORY — DX: Depression, unspecified: F32.A

## 2016-01-31 NOTE — Assessment & Plan Note (Signed)
She is due for PAP smear but has deferred

## 2016-01-31 NOTE — Assessment & Plan Note (Signed)
Thyroid function is WNL on current dose.  No current changes needed.   Lab Results  Component Value Date   TSH 1.54 01/29/2016

## 2016-01-31 NOTE — Assessment & Plan Note (Signed)
Annual comprehensive preventive exam was done as well as an evaluation and management of chronic conditions .  During the course of the visit the patient was educated and counseled about appropriate screening and preventive services including :  diabetes screening, lipid analysis with projected  10 year  risk for CAD , nutrition counseling, breast, cervical and colorectal cancer screening, and recommended immunizations.  Printed recommendations for health maintenance screenings was given 

## 2016-01-31 NOTE — Assessment & Plan Note (Addendum)
Based on current lipid profile, the risk of clinically significant Coronary artery disease is 12% over the next 10 years, using the Framingham risk calculator.  Will recommend statin therapy and aspirin. Prior use of fenofibrate.  Lab Results  Component Value Date   CHOL 259 (H) 01/29/2016   HDL 41.20 01/29/2016   LDLCALC 155 (H) 11/27/2014   LDLDIRECT 141.0 01/29/2016   TRIG 229.0 (H) 01/29/2016   CHOLHDL 6 01/29/2016

## 2016-01-31 NOTE — Assessment & Plan Note (Signed)
Witnessed,  With normla CT head,  Normal EEG ,  No recurrence.

## 2016-01-31 NOTE — Assessment & Plan Note (Signed)
found by 011 colonoscopy Vira Agar).  Referral to Dr Bary Castilla for follow up due last year.

## 2016-01-31 NOTE — Assessment & Plan Note (Signed)
Discussed initiation of therapy; but she would prefer to see a psychiatrist. She is not suicidal

## 2016-02-01 LAB — COMPREHENSIVE METABOLIC PANEL
ALK PHOS: 39 U/L (ref 33–130)
ALT: 14 U/L (ref 6–29)
AST: 17 U/L (ref 10–35)
Albumin: 4.4 g/dL (ref 3.6–5.1)
BUN: 16 mg/dL (ref 7–25)
CHLORIDE: 104 mmol/L (ref 98–110)
CO2: 22 mmol/L (ref 20–31)
CREATININE: 0.95 mg/dL (ref 0.50–0.99)
Calcium: 9.2 mg/dL (ref 8.6–10.4)
GLUCOSE: 95 mg/dL (ref 65–99)
POTASSIUM: 4.8 mmol/L (ref 3.5–5.3)
SODIUM: 138 mmol/L (ref 135–146)
Total Bilirubin: 0.5 mg/dL (ref 0.2–1.2)
Total Protein: 6.8 g/dL (ref 6.1–8.1)

## 2016-02-02 ENCOUNTER — Encounter: Payer: Self-pay | Admitting: Internal Medicine

## 2016-02-18 ENCOUNTER — Other Ambulatory Visit: Payer: Self-pay | Admitting: Internal Medicine

## 2016-02-18 ENCOUNTER — Ambulatory Visit (INDEPENDENT_AMBULATORY_CARE_PROVIDER_SITE_OTHER): Payer: BLUE CROSS/BLUE SHIELD | Admitting: General Surgery

## 2016-02-18 ENCOUNTER — Encounter: Payer: Self-pay | Admitting: General Surgery

## 2016-02-18 VITALS — BP 124/74 | HR 78 | Resp 12 | Ht 61.0 in | Wt 169.0 lb

## 2016-02-18 DIAGNOSIS — Z860100 Personal history of colon polyps, unspecified: Secondary | ICD-10-CM | POA: Insufficient documentation

## 2016-02-18 MED ORDER — POLYETHYLENE GLYCOL 3350 17 GM/SCOOP PO POWD: 1.0000 | Freq: Once | ORAL | 0 refills | Status: AC

## 2016-02-18 MED ORDER — PAROXETINE HCL 10 MG PO TABS: 10.0000 mg | ORAL_TABLET | Freq: Every day | ORAL | 2 refills | Status: DC

## 2016-02-18 NOTE — Progress Notes (Signed)
Patient ID: Lorraine Mills, female   DOB: 12-31-1950, 65 y.o.   MRN: [id number]  Chief Complaint  Patient presents with  . Colonoscopy    HPI Lorraine Mills is a 64 y.o. female here today for a evaluation of a screening colonoscopy. Last colonoscopy 08/17/2009. Patient states no Gi problems at this time. Moves bowels daily. Patient states she had a seizure in  04/2015. Negative workup. No recurrence.   The patient reports she had a meniscal tear identified in 2014. Care at that time was provided by [place], M.D. from [facility]. She's recently had some recurrent symptoms and in fact thought that today's visit was about her knee rather than her previous colon polyps. We offered to contact the orthopedics office, but she reported that she would take care of this on her own.  Retired from [employer] for four years now.  HPI  Past Medical History:  Diagnosis Date  . Anxiety   . Goiter   . Hyperlipidemia    TGs  . Seasonal allergies     Past Surgical History:  Procedure Laterality Date  . ABDOMINAL HYSTERECTOMY     supracervical   . BILATERAL OOPHORECTOMY  2001   BENIGN TUMOR  . CATARACT EXTRACTION Right   . COLONOSCOPY  08/17/2009  . CORNEAL TRANSPLANT Right     Family History  Problem Relation Age of Onset  . Cancer Mother     OVARIAN  . Stroke Father   . Cancer Sister     BREAST  . Thyroid disease Other     MULTIPLE FAMILY MEMBERS    Social History Social History  Substance Use Topics  . Smoking status: Former Smoker    Years: 24.00    Types: Cigarettes    Quit date: 05/09/1998  . Smokeless tobacco: Never Used  . Alcohol use 0.0 oz/week     Comment: once a week    Allergies  Allergen Reactions  . Levofloxacin     FLUOROQUINOLONES     Current Outpatient Prescriptions  Medication Sig Dispense Refill  . ALPRAZolam (XANAX) 0.5 MG tablet Take 0.5 mg by mouth at bedtime as needed for anxiety.    Marland Kitchen escitalopram (LEXAPRO) 20 MG tablet TAKE ONE  TABLET BY MOUTH EVERY DAY 90 tablet 0  . fenofibrate (TRICOR) 48 MG tablet Take 1 tablet (48 mg total) by mouth daily. Pt must be seen for further refills 90 tablet 0  . fexofenadine-pseudoephedrine (ALLEGRA-D 24) 180-240 MG per 24 hr tablet Take 1 tablet by mouth daily.    . fluticasone (FLONASE) 50 MCG/ACT nasal spray Place 2 sprays into the nose daily.    Marland Kitchen levothyroxine (SYNTHROID, LEVOTHROID) 75 MCG tablet TAKE ONE TABLET BY MOUTH EVERY DAY 90 tablet 2  . meloxicam (MOBIC) 15 MG tablet Take 1 tablet (15 mg total) by mouth daily. 30 tablet 4  . PARoxetine (PAXIL) 10 MG tablet Take 1 tablet (10 mg total) by mouth daily. 30 tablet 2  . prednisoLONE acetate (PRED FORTE) 1 % ophthalmic suspension Place 1 drop into the right eye 4 (four) times daily.    . polyethylene glycol powder (GLYCOLAX/MIRALAX) powder Take 255 g by mouth once. 255 g 0   No current facility-administered medications for this visit.     Review of Systems Review of Systems  Constitutional: Negative.   Respiratory: Negative.   Cardiovascular: Negative.     Blood pressure 124/74, pulse 78, resp. rate 12, height '5\' 1"'$  (1.549 m), weight 169 lb (76.7 kg).  Physical Exam Physical Exam  Constitutional: She is oriented to person, place, and time. She appears well-developed and well-nourished.  Eyes: Conjunctivae are normal. No scleral icterus.  Neck: Neck supple.  Cardiovascular: Normal rate, regular rhythm and normal heart sounds.   Pulmonary/Chest: Effort normal and breath sounds normal.  Abdominal: Soft. Bowel sounds are normal. There is no tenderness.  Lymphadenopathy:    She has no cervical adenopathy.  Neurological: She is alert and oriented to person, place, and time.  Skin: Skin is warm and dry.    Data Reviewed 08/17/2009 colonoscopy report reviewed. Polyp of the descending colon, tubular adenoma on pathologic review. Hyperplastic polyp in the sigmoid.  Assessment    Past history colonic polyps.     Plan    The opportunity for back referral to Dr. Vira Agar who completed her original study was discussed and declined.    Colonoscopy with possible biopsy/polypectomy prn: Information regarding the procedure, including its potential risks and complications (including but not limited to perforation of the bowel, which may require emergency surgery to repair, and bleeding) was verbally given to the patient. Educational information regarding lower intestinal endoscopy was given to the patient. Written instructions for how to complete the bowel prep using Miralax were provided. The importance of drinking ample fluids to avoid dehydration as a result of the prep emphasized.  The patient is scheduled for a Colonoscopy at [facility] on 03/19/16. They are aware to call the day before to get their arrival time. Miralax prescription has been sent into the patient's pharmacy. The patient is aware of date and instructions.    This information has been scribed by Gaspar Cola CMA.    Robert Bellow 02/18/2016, 9:05 PM

## 2016-02-18 NOTE — Patient Instructions (Addendum)
Colonoscopy A colonoscopy is an exam to look at the entire large intestine (colon). This exam can help find problems such as tumors, polyps, inflammation, and areas of bleeding. The exam takes about 1 hour.  LET [facility] CARE PROVIDER KNOW ABOUT:   Any allergies you have.  All medicines you are taking, including vitamins, herbs, eye drops, creams, and over-the-counter medicines.  Previous problems you or members of your family have had with the use of anesthetics.  Any blood disorders you have.  Previous surgeries you have had.  Medical conditions you have. RISKS AND COMPLICATIONS  Generally, this is a safe procedure. However, as with any procedure, complications can occur. Possible complications include:  Bleeding.  Tearing or rupture of the colon wall.  Reaction to medicines given during the exam.  Infection (rare). BEFORE THE PROCEDURE   Ask your health care provider about changing or stopping your regular medicines.  You may be prescribed an oral bowel prep. This involves drinking a large amount of medicated liquid, starting the day before your procedure. The liquid will cause you to have multiple loose stools until your stool is almost clear or light green. This cleans out your colon in preparation for the procedure.  Do not eat or drink anything else once you have started the bowel prep, unless your health care provider tells you it is safe to do so.  Arrange for someone to drive you home after the procedure. PROCEDURE   You will be given medicine to help you relax (sedative).  You will lie on your side with your knees bent.  A long, flexible tube with a light and camera on the end (colonoscope) will be inserted through the rectum and into the colon. The camera sends video back to a computer screen as it moves through the colon. The colonoscope also releases carbon dioxide gas to inflate the colon. This helps your health care provider see the area better.  During  the exam, your health care provider may take a small tissue sample (biopsy) to be examined under a microscope if any abnormalities are found.  The exam is finished when the entire colon has been viewed. AFTER THE PROCEDURE   Do not drive for 24 hours after the exam.  You may have a small amount of blood in your stool.  You may pass moderate amounts of gas and have mild abdominal cramping or bloating. This is caused by the gas used to inflate your colon during the exam.  Ask when your test results will be ready and how you will get your results. Make sure you get your test results.   This information is not intended to replace advice given to you by your health care provider. Make sure you discuss any questions you have with your health care provider.   Document Released: 05/23/2000 Document Revised: 03/16/2013 Document Reviewed: 01/31/2013 Elsevier Interactive Patient Education [employer].  The patient is scheduled for a Colonoscopy at [facility] on 03/19/16. They are aware to call the day before to get their arrival time. Miralax prescription has been sent into the patient's pharmacy. The patient is aware of date and instructions.

## 2016-02-18 NOTE — Progress Notes (Signed)
paroex

## 2016-02-26 ENCOUNTER — Encounter: Payer: Self-pay | Admitting: Internal Medicine

## 2016-02-26 ENCOUNTER — Other Ambulatory Visit: Payer: Self-pay | Admitting: Internal Medicine

## 2016-02-26 ENCOUNTER — Ambulatory Visit
Admission: RE | Admit: 2016-02-26 | Discharge: 2016-02-26 | Disposition: A | Discharge status: Home / Self Care | Payer: BLUE CROSS/BLUE SHIELD | Source: Ambulatory Visit | Attending: Internal Medicine | Admitting: Internal Medicine

## 2016-02-26 DIAGNOSIS — Z1231 Encounter for screening mammogram for malignant neoplasm of breast: Secondary | ICD-10-CM | POA: Diagnosis present

## 2016-02-26 DIAGNOSIS — Z1239 Encounter for other screening for malignant neoplasm of breast: Secondary | ICD-10-CM

## 2016-02-26 DIAGNOSIS — M25562 Pain in left knee: Secondary | ICD-10-CM

## 2016-02-27 NOTE — Telephone Encounter (Signed)
Pt referral in process

## 2016-02-28 ENCOUNTER — Inpatient Hospital Stay
Admission: RE | Admit: 2016-02-28 | Discharge: 2016-02-28 | Disposition: A | Discharge status: Home / Self Care | Payer: Self-pay | Source: Ambulatory Visit | Attending: *Deleted | Admitting: *Deleted

## 2016-02-28 ENCOUNTER — Other Ambulatory Visit: Payer: Self-pay | Admitting: *Deleted

## 2016-02-28 DIAGNOSIS — Z9289 Personal history of other medical treatment: Secondary | ICD-10-CM

## 2016-03-18 ENCOUNTER — Encounter: Payer: Self-pay | Admitting: *Deleted

## 2016-03-19 ENCOUNTER — Encounter: Payer: Self-pay | Admitting: General Surgery

## 2016-03-19 ENCOUNTER — Ambulatory Visit: Payer: BLUE CROSS/BLUE SHIELD | Admitting: *Deleted

## 2016-03-19 ENCOUNTER — Ambulatory Visit
Admission: RE | Admit: 2016-03-19 | Discharge: 2016-03-19 | Disposition: A | Discharge status: Home / Self Care | Payer: BLUE CROSS/BLUE SHIELD | Source: Ambulatory Visit | Attending: General Surgery | Admitting: General Surgery

## 2016-03-19 ENCOUNTER — Encounter
Admission: RE | Disposition: A | Discharge status: Home / Self Care | Payer: Self-pay | Source: Ambulatory Visit | Attending: General Surgery

## 2016-03-19 DIAGNOSIS — Z86010 Personal history of colonic polyps: Secondary | ICD-10-CM | POA: Diagnosis not present

## 2016-03-19 DIAGNOSIS — E785 Hyperlipidemia, unspecified: Secondary | ICD-10-CM | POA: Diagnosis not present

## 2016-03-19 DIAGNOSIS — E039 Hypothyroidism, unspecified: Secondary | ICD-10-CM | POA: Insufficient documentation

## 2016-03-19 DIAGNOSIS — F329 Major depressive disorder, single episode, unspecified: Secondary | ICD-10-CM | POA: Insufficient documentation

## 2016-03-19 DIAGNOSIS — Z87891 Personal history of nicotine dependence: Secondary | ICD-10-CM | POA: Insufficient documentation

## 2016-03-19 DIAGNOSIS — Z1211 Encounter for screening for malignant neoplasm of colon: Secondary | ICD-10-CM | POA: Insufficient documentation

## 2016-03-19 DIAGNOSIS — F419 Anxiety disorder, unspecified: Secondary | ICD-10-CM | POA: Insufficient documentation

## 2016-03-19 DIAGNOSIS — Z79899 Other long term (current) drug therapy: Secondary | ICD-10-CM | POA: Diagnosis not present

## 2016-03-19 HISTORY — PX: COLONOSCOPY WITH PROPOFOL: SHX5780

## 2016-03-19 SURGERY — COLONOSCOPY WITH PROPOFOL
Anesthesia: General

## 2016-03-19 MED ORDER — SODIUM CHLORIDE 0.9 % IV SOLN
INTRAVENOUS | Status: DC
  Administered 2016-03-19: 10:00:00 via INTRAVENOUS
  Administered 2016-03-19: 1000 mL via INTRAVENOUS

## 2016-03-19 MED ORDER — PROPOFOL 500 MG/50ML IV EMUL
INTRAVENOUS | Status: DC | PRN
  Administered 2016-03-19: 100 ug/kg/min via INTRAVENOUS

## 2016-03-19 NOTE — Anesthesia Postprocedure Evaluation (Signed)
Anesthesia Post Note  Patient: Lorraine Mills  Procedure(s) Performed: Procedure(s) (LRB): COLONOSCOPY WITH PROPOFOL (N/A)  Patient location during evaluation: PACU Anesthesia Type: General Level of consciousness: awake Pain management: pain level controlled Vital Signs Assessment: post-procedure vital signs reviewed and stable Respiratory status: spontaneous breathing Cardiovascular status: stable Anesthetic complications: no    Last Vitals:  Vitals:   03/19/16 1030 03/19/16 1040  BP: 113/67 116/76  Pulse:    Resp:    Temp:      Last Pain:  Vitals:   03/19/16 1014  TempSrc: Tympanic                 VAN STAVEREN,[name]

## 2016-03-19 NOTE — H&P (Signed)
TIARE ROHLMAN [id number] 09/12/1950     HPI: Healthy 65 y/o woman with previous tubular adenoma of the descending colon. For follow up endoscopy.   Prescriptions Prior to Admission  Medication Sig Dispense Refill Last Dose  . ALPRAZolam (XANAX) 0.5 MG tablet Take 0.5 mg by mouth at bedtime as needed for anxiety.   03/18/2016 at Unknown time  . escitalopram (LEXAPRO) 20 MG tablet TAKE ONE TABLET BY MOUTH EVERY DAY 90 tablet 0 03/18/2016 at Unknown time  . fenofibrate (TRICOR) 48 MG tablet Take 1 tablet (48 mg total) by mouth daily. Pt must be seen for further refills 90 tablet 0 03/18/2016 at Unknown time  . fexofenadine-pseudoephedrine (ALLEGRA-D 24) 180-240 MG per 24 hr tablet Take 1 tablet by mouth daily.   03/18/2016 at Unknown time  . fluticasone (FLONASE) 50 MCG/ACT nasal spray Place 2 sprays into the nose daily.   03/18/2016 at Unknown time  . levothyroxine (SYNTHROID, LEVOTHROID) 75 MCG tablet TAKE ONE TABLET BY MOUTH EVERY DAY 90 tablet 2 03/19/2016 at 0700  . meloxicam (MOBIC) 15 MG tablet Take 1 tablet (15 mg total) by mouth daily. 30 tablet 4 03/18/2016 at Unknown time  . PARoxetine (PAXIL) 10 MG tablet Take 1 tablet (10 mg total) by mouth daily. 30 tablet 2 03/18/2016 at Unknown time  . prednisoLONE acetate (PRED FORTE) 1 % ophthalmic suspension Place 1 drop into the right eye 4 (four) times daily.   03/18/2016 at Unknown time   Allergies  Allergen Reactions  . Levofloxacin     FLUOROQUINOLONES    Past Medical History:  Diagnosis Date  . Anxiety   . Goiter   . Hyperlipidemia    TGs  . Seasonal allergies    Past Surgical History:  Procedure Laterality Date  . ABDOMINAL HYSTERECTOMY     supracervical   . BILATERAL OOPHORECTOMY  2001   BENIGN TUMOR  . BREAST BIOPSY Left 1985   cyst   . CATARACT EXTRACTION Right   . COLONOSCOPY  08/17/2009  . CORNEAL TRANSPLANT Right    Social History   Social History  . Marital status: Married    Spouse name: N/A  . Number of  children: N/A  . Years of education: N/A   Occupational History  . Not on file.   Social History Main Topics  . Smoking status: Former Smoker    Years: 24.00    Types: Cigarettes    Quit date: 05/09/1998  . Smokeless tobacco: Never Used  . Alcohol use 0.0 oz/week     Comment: once a week  . Drug use: No  . Sexual activity: Not on file   Other Topics Concern  . Not on file   Social History Narrative  . No narrative on file   Social History   Social History Narrative  . No narrative on file     ROS: Negative.     PE: HEENT: Negative. Lungs: Clear. Cardio: RR.  Assessment/Plan:  Proceed with planned endoscopy.  Robert Bellow 03/19/2016

## 2016-03-19 NOTE — Op Note (Signed)
[facility] Patient Name: Lorraine Mills Procedure Date: 03/19/2016 9:48 AM MRN: [id number] Account #: [id number] Date of Birth: 11/27/1950 Admit Type: Outpatient Age: 65 Room: [facility] ENDO ROOM 1 Gender: Female Note Status: Finalized Procedure:            Colonoscopy Indications:          High risk colon cancer surveillance: Personal history                        of colonic polyps Providers:            Robert Bellow, MD Referring MD:         Deborra Medina, MD (Referring MD) Medicines:            Monitored Anesthesia Care Complications:        No immediate complications. Procedure:            Pre-Anesthesia Assessment:                       - Prior to the procedure, a History and Physical was                        performed, and patient medications, allergies and                        sensitivities were reviewed. The patient's tolerance of                        previous anesthesia was reviewed.                       - The risks and benefits of the procedure and the                        sedation options and risks were discussed with the                        patient. All questions were answered and informed                        consent was obtained.                       After obtaining informed consent, the colonoscope was                        passed under direct vision. Throughout the procedure,                        the patient's blood pressure, pulse, and oxygen                        saturations were monitored continuously. The                        Colonoscope was introduced through the anus and                        advanced to the the cecum, identified by appendiceal  orifice and ileocecal valve. The colonoscopy was                        performed without difficulty. The patient tolerated the                        procedure well. The quality of the bowel preparation                        was  excellent. Findings:      The entire examined colon appeared normal on direct and retroflexion       views. Impression:           - The entire examined colon is normal on direct and                        retroflexion views.                       - No specimens collected. Recommendation:       - Repeat colonoscopy in 5 years for surveillance. Procedure Code(s):    --- Professional ---                       [id number], Colonoscopy, flexible; diagnostic, including                        collection of specimen(s) by brushing or washing, when                        performed (separate procedure) Diagnosis Code(s):    --- Professional ---                       Z86.010, Personal history of colonic polyps CPT copyright 2016 [employer]. All rights reserved. The codes documented in this report are preliminary and upon coder review may  be revised to meet current compliance requirements. Robert Bellow, MD 03/19/2016 10:10:23 AM This report has been signed electronically. Number of Addenda: 0 Note Initiated On: 03/19/2016 9:48 AM Scope Withdrawal Time: 0 hours 6 minutes 34 seconds  Total Procedure Duration: 0 hours 15 minutes 29 seconds       [facility]

## 2016-03-19 NOTE — Transfer of Care (Signed)
Immediate Anesthesia Transfer of Care Note  Patient: Lorraine Mills  Procedure(s) Performed: Procedure(s): COLONOSCOPY WITH PROPOFOL (N/A)  Patient Location: PACU  Anesthesia Type:General  Level of Consciousness: awake, alert  and oriented  Airway & Oxygen Therapy: Patient Spontanous Breathing and Patient connected to nasal cannula oxygen  Post-op Assessment: Report given to RN and Post -op Vital signs reviewed and stable  Post vital signs: Reviewed and stable  Last Vitals:  Vitals:   03/19/16 0901 03/19/16 1014  BP: 127/65 113/67  Pulse: 73 67  Resp: 16 16  Temp: 36.1 C 36.4 C    Last Pain:  Vitals:   03/19/16 1014  TempSrc: Tympanic         Complications: No apparent anesthesia complications

## 2016-03-19 NOTE — Anesthesia Preprocedure Evaluation (Signed)
Anesthesia Evaluation  Patient identified by MRN, date of birth, ID band Patient awake    Reviewed: Allergy & Precautions, NPO status , Patient's Chart, lab work & pertinent test results  Airway Mallampati: II       Dental  (+) Teeth Intact   Pulmonary neg pulmonary ROS, former smoker,    breath sounds clear to auscultation       Cardiovascular Exercise Tolerance: Good  Rhythm:Regular Rate:Normal     Neuro/Psych  Headaches, Seizures -,  Anxiety Depression    GI/Hepatic negative GI ROS, Neg liver ROS,   Endo/Other  Hypothyroidism   Renal/GU negative Renal ROS     Musculoskeletal negative musculoskeletal ROS (+)   Abdominal   Peds  Hematology negative hematology ROS (+)   Anesthesia Other Findings   Reproductive/Obstetrics                             Anesthesia Physical Anesthesia Plan  ASA: II  Anesthesia Plan: General   Post-op Pain Management:    Induction: Intravenous  Airway Management Planned: Natural Airway and Nasal Cannula  Additional Equipment:   Intra-op Plan:   Post-operative Plan:   Informed Consent: I have reviewed the patients History and Physical, chart, labs and discussed the procedure including the risks, benefits and alternatives for the proposed anesthesia with the patient or authorized representative who has indicated his/her understanding and acceptance.     Plan Discussed with: CRNA  Anesthesia Plan Comments:         Anesthesia Quick Evaluation

## 2016-03-25 ENCOUNTER — Encounter: Payer: Self-pay | Admitting: Physical Therapy

## 2016-03-25 ENCOUNTER — Ambulatory Visit: Payer: BLUE CROSS/BLUE SHIELD | Attending: Internal Medicine

## 2016-03-25 DIAGNOSIS — M25562 Pain in left knee: Secondary | ICD-10-CM | POA: Diagnosis not present

## 2016-03-25 DIAGNOSIS — G8929 Other chronic pain: Secondary | ICD-10-CM | POA: Diagnosis present

## 2016-03-25 DIAGNOSIS — M6281 Muscle weakness (generalized): Secondary | ICD-10-CM | POA: Diagnosis present

## 2016-03-25 NOTE — Patient Instructions (Addendum)
Bridge    Lie back, L leg bent on floor and R knee up toward chest. Inhale, pressing hips up. Hold for 3 seconds. Exhale, rolling down along spine from top. Perform _10___ times. Rest and then repeat. Do __2__ sessions per day.    Hip Abduction: Side-Lying (Single Leg)    Lie on R side with knees bent, belt around thighs just above knees. Push up into belt keeping knee bent.  Hold for 3 seconds. Perform _10___ times. Rest and then repeat. Do __2__ sessions per day.  Hip Abductors (Side Lying)    Lie on R side with bottom leg bent at knee. Lift left leg up and back. Keep upper hip rolled forward and knee straight. Foot shoulder be parallel to floor. Hold _3___ seconds. Repeat _10___ times. Repeat. Do __2__ sessions per day.  HIP: Flexors - Supine    Lie on edge of surface. Place leg off the surface, allow knee to bend. Bring other knee toward chest. Hold _30__ seconds. __3_ reps per set, __2_ sets per day, _7__ days per week

## 2016-03-26 NOTE — Therapy (Signed)
[facility] [address] [place], [address] Phone: [phone]   Fax:  [phone]  Physical Therapy Evaluation  Patient Details  Name: Lorraine Mills MRN: [id number] Date of Birth: 1951/02/11 Referring Provider: Deborra Medina  Encounter Date: 03/25/2016      PT End of Session - 03/26/16 0943    Visit Number 1   Number of Visits 17   Date for PT Re-Evaluation 05/20/16   Authorization Type no g codes   PT Start Time 0945   PT Stop Time 1045   PT Time Calculation (min) 60 min   Activity Tolerance Patient tolerated treatment well   Behavior During Therapy [facility] for tasks assessed/performed      Past Medical History:  Diagnosis Date  . Anxiety   . Goiter   . Hyperlipidemia    TGs  . Seasonal allergies     Past Surgical History:  Procedure Laterality Date  . ABDOMINAL HYSTERECTOMY     supracervical   . BILATERAL OOPHORECTOMY  09/09/99   BENIGN TUMOR  . BREAST BIOPSY Left 1985   cyst   . CATARACT EXTRACTION Right   . COLONOSCOPY  08/17/2009  . COLONOSCOPY WITH PROPOFOL N/A 03/19/2016   Procedure: COLONOSCOPY WITH PROPOFOL;  Surgeon: Robert Bellow, MD;  Location: [facility] ENDOSCOPY;  Service: Endoscopy;  Laterality: N/A;  . CORNEAL TRANSPLANT Right     There were no vitals filed for this visit.       Subjective Assessment - 03/25/16 0952    Subjective L knee pain   Pertinent History Pt reports a history of knee pain for at least 10 years. Initially the pain was low level but has worsened since her trip to the [place] in [address]68. Pt reports she was sent to an orthopedist after that who ordered an MRI which revealed multiple meniscus tears. Pt reports her L knee catches intermittently. No recent swelling of L knee but initially in 09-08-2012 knee was swollen after her trip. Denies redness or tenderness. Pt states she has difficulty squatting or exercising and would like to be able to increase her  activity level. ROS negative for red flags.   How long can you sit comfortably? No limitation   How long can you stand comfortably? No limitation   How long can you walk comfortably? 30 minutes   Diagnostic tests MRI Sep 08, 2012): multiple meniscus tears per patient report   Patient Stated Goals Be able to walk, squat, and exercise with less pain.    Currently in Pain? Yes   Pain Score 1   Best: 1/10, Worst: 6/10   Pain Location Knee   Pain Orientation Left   Pain Descriptors / Indicators Stabbing;Burning   Pain Type Chronic pain   Pain Radiating Towards Denies numbness/tingling. Reports pain radiates a little superiorly into quadricep.    Pain Onset More than a month ago   Pain Frequency Constant   Aggravating Factors  Full knee extension for extended period of time, running, coming down stairs, squating,    Pain Relieving Factors bending knee after prolonged extension,     Multiple Pain Sites No            [facility] PT Assessment - 03/25/16 1003      Assessment   Medical Diagnosis L knee pain (M25.562)   Referring Provider Deborra Medina   Onset Date/Surgical Date 11/07/12   Hand Dominance Right   Next MD Visit Febuary 2018 with PCP  Prior Therapy None     Precautions   Precautions None     Restrictions   Weight Bearing Restrictions No     Balance Screen   Has the patient fallen in the past 6 months No   Has the patient had a decrease in activity level because of a fear of falling?  Yes   Is the patient reluctant to leave their home because of a fear of falling?  No     Home Environment   Living Environment Private residence   Living Arrangements Spouse/significant other;Children   Available Help at Discharge Family   Type of [place] to enter   Entrance Stairs-Number of Steps 6   Entrance Stairs-Rails Right   [place] One level     Prior Function   Level of Independence Independent     Cognition   Overall Cognitive Status Within  Functional Limits for tasks assessed     Observation/Other Assessments   Other Surveys  Other Surveys   Lower Extremity Functional Scale  53     Sensation   Additional Comments Denies N/T. WNL light touch sensationL2-S2     ROM / Strength   AROM / PROM / Strength Strength;PROM     PROM   Overall PROM Comments HS length approximately 80 degrees bilaterally. Pain with L knee end range flexion with OP. Medial and lateral joint line tenderness. Pt lacking approximately 2-3 degrees of L knee extension. Decreased medial and lateral patellar mobility but symmetrical and pain-free.Marland Kitchen Positive McMurrays test, positive joint line tenderness, positive Eges test. Negative Thessalay. Positive subjective catch. Pt with weight shifting to the R during squat. Decreased ankle PROM noted during squat.    PROM Assessment Site Hip;Knee   Right/Left Hip Right;Left   Right Hip External Rotation  30   Right Hip Internal Rotation  20   Left Hip External Rotation  35   Left Hip Internal Rotation  20     Strength   Strength Assessment Site Hip;Knee;Ankle   Right/Left Hip Right;Left   Right Hip Flexion 4/5   Right Hip Extension 4-/5   Right Hip External Rotation  4/5   Right Hip Internal Rotation 4+/5   Right Hip ABduction 4-/5   Right Hip ADduction 4/5   Left Hip Flexion 4/5   Left Hip Extension 4-/5   Left Hip External Rotation 4/5   Left Hip Internal Rotation 4+/5   Left Hip ABduction 4/5   Left Hip ADduction 4/5     Palpation   Palpation comment Medial and lateral joint line tenderness     Ambulation/Gait   Gait Comments Pt with medium heigh arches but not observed collapse during ambulation. No supination noted either          TREATMENT  Ther-ex Single leg bridges on LLE x 10; Sidelying isometric clams with LLE on top 3-5 second hold x 10; Sidelying L hip abduction with cues to prevent hip ER and pelvic rotation x 10; Supine L hip flexor stretch with hip over side of mat table x 30  seconds; Pt provided written HEP with extensive education about how to perform exercises at home.                     PT Education - 03/26/16 [phone]    Education provided Yes   Education Details HEP and plan of care   Person(s) Educated Patient   Methods Explanation;Demonstration;Tactile cues;Verbal cues;Handout   Comprehension  Verbalized understanding;Returned demonstration;Verbal cues required             PT Long Term Goals - 03/26/16 1328      PT LONG TERM GOAL #1   Title Pt will be independent with HEP in order to improve strength and balance in order to decrease fall risk and improve function at home and work.    Time 8   Period Weeks   Status New     PT LONG TERM GOAL #2   Title Pt will increase LEFS by at least 9 points in order to demonstrate significant improvement in lower extremity function.    Baseline 03/25/16: 53/80   Time 8   Period Weeks   Status New     PT LONG TERM GOAL #3   Title Pt will improve hip abduction and extension MMT by at least 1/2 grade in order to demonstrate improved knee stability with things like stair descent   Baseline 03/25/16: see flowsheet   Time 8   Period Weeks   Status New     PT LONG TERM GOAL #4   Title Pt will improve hip internal and external rotation by at least 10 degrees bilaterally in order to decrease strain on knees with twisting activities   Baseline 03/25/16: see flowsheet   Time 8   Period Weeks   Status New               Plan - 03/26/16 0943    Clinical Impression Statement Pt is a pleasant 65 yo female referred for L knee pain. Pt reports history of chronic knee pain but worsened over the last few years. She states that she has had an MRI in the past with multiple reported meniscal tears. Pt with positive joint line tenderness as well as pain with overpressure at end range flexion. She lacks approximately 2-3 degrees of L knee extension as observed in supine. Positive McMurrays test with  both medial and lateral bias. No ligamentous laxity. Pt with decreased bilateral hip strength as well as decreased ankle dorsiflexion and hip IR/ER PROM. Pt will benefit from skilled PT services to address deficits in strength and pain in order to improve function at home and with leisure activities.    Rehab Potential Good   Clinical Impairments Affecting Rehab Potential Positive: motivation, age; Negative: chronicity   PT Frequency 2x / week   PT Duration 8 weeks   PT Treatment/Interventions ADLs/Self Care Home Management;Aquatic Therapy;Electrical Stimulation;Cryotherapy;Iontophoresis '4mg'$ /ml Dexamethasone;Moist Heat;Traction;Ultrasound;Gait training;Stair training;Therapeutic activities;Therapeutic exercise;Balance training;Neuromuscular re-education;Patient/family education;Manual techniques;Dry needling   PT Next Visit Plan Progress hip and ankle mobility, hip and knee pain-free strengthening   PT Home Exercise Plan Single leg bridges, sidelying isometric clam for ER, sidelying hip abduction, supine hip flexor stretch off side of bed   Consulted and Agree with Plan of Care Patient      Patient will benefit from skilled therapeutic intervention in order to improve the following deficits and impairments:  Decreased strength, Pain  Visit Diagnosis: Chronic pain of left knee - Plan: PT plan of care cert/re-cert  Muscle weakness (generalized) - Plan: PT plan of care cert/re-cert     Problem List Patient Active Problem List   Diagnosis Date Noted  . Personal history of colonic polyps 02/18/2016  . Tubular adenoma of colon 01/31/2016  . Depression 01/31/2016  . Seizure disorder ([place]) 03/28/2015  . S/P hysterectomy with oophorectomy 05/27/2014  . Dyslipidemia (high LDL; low HDL) 12/01/2013  . Overweight 12/01/2013  . Headache(784.0)  10/09/2012  . Routine general medical examination at a health care facility 10/07/2012  . Verruca vulgaris 10/07/2012  . Menopause present, declines  hormone replacement therapy 10/03/2011  . Hypothyroidism 10/03/2011  . Goiter 08/25/2011   Phillips Grout PT, DPT   Huprich,Jason 03/26/2016, 1:39 PM  [facility] [address], [place], [address] Phone: [phone]   Fax:  [phone]  Name: CHEQUITA MOFIELD MRN: [id number] Date of Birth: April 13, 1951

## 2016-03-27 ENCOUNTER — Encounter: Payer: BLUE CROSS/BLUE SHIELD | Admitting: Physical Therapy

## 2016-04-01 ENCOUNTER — Ambulatory Visit: Payer: BLUE CROSS/BLUE SHIELD | Admitting: Physical Therapy

## 2016-04-01 ENCOUNTER — Encounter: Payer: BLUE CROSS/BLUE SHIELD | Admitting: Physical Therapy

## 2016-04-01 ENCOUNTER — Other Ambulatory Visit: Payer: Self-pay | Admitting: Internal Medicine

## 2016-04-01 DIAGNOSIS — M6281 Muscle weakness (generalized): Secondary | ICD-10-CM

## 2016-04-01 DIAGNOSIS — M25562 Pain in left knee: Principal | ICD-10-CM

## 2016-04-01 DIAGNOSIS — G8929 Other chronic pain: Secondary | ICD-10-CM

## 2016-04-01 NOTE — Therapy (Signed)
[facility] [address], [place], [address] Phone: [phone]   Fax:  [phone]  Physical Therapy Treatment  Patient Details  Name: Lorraine Mills MRN: [id number] Date of Birth: 07/01/1950 Referring Provider: Deborra Medina  Encounter Date: 04/01/2016      PT End of Session - 04/01/16 0847    Visit Number 2   Number of Visits 17   Date for PT Re-Evaluation 05/20/16   Authorization Type no g codes   PT Start Time 0808   PT Stop Time 0840   PT Time Calculation (min) 32 min   Activity Tolerance Patient tolerated treatment well   Behavior During Therapy [facility] for tasks assessed/performed      Past Medical History:  Diagnosis Date  . Anxiety   . Goiter   . Hyperlipidemia    TGs  . Seasonal allergies     Past Surgical History:  Procedure Laterality Date  . ABDOMINAL HYSTERECTOMY     supracervical   . BILATERAL OOPHORECTOMY  Sep 12, 1999   BENIGN TUMOR  . BREAST BIOPSY Left 1985   cyst   . CATARACT EXTRACTION Right   . COLONOSCOPY  08/17/2009  . COLONOSCOPY WITH PROPOFOL N/A 03/19/2016   Procedure: COLONOSCOPY WITH PROPOFOL;  Surgeon: Robert Bellow, MD;  Location: [facility] ENDOSCOPY;  Service: Endoscopy;  Laterality: N/A;  . CORNEAL TRANSPLANT Right     There were no vitals filed for this visit.      Subjective Assessment - 04/01/16 0810    Subjective Patient reports she has continued to have L knee pain, though possibly slightly reduced since eval. Notes increased tightness sensation this AM.    Pertinent History Pt reports a history of knee pain for at least 10 years. Initially the pain was low level but has worsened since her trip to the [place] in [address]. Pt reports she was sent to an orthopedist after that who ordered an MRI which revealed multiple meniscus tears. Pt reports her L knee catches intermittently. No recent swelling of L knee but initially in 2012-09-11 knee was swollen after her  trip. Denies redness or tenderness. Pt states she has difficulty squatting or exercising and would like to be able to increase her activity level. ROS negative for red flags.   How long can you sit comfortably? No limitation   How long can you stand comfortably? No limitation   How long can you walk comfortably? 30 minutes   Diagnostic tests MRI Sep 11, 2012): multiple meniscus tears per patient report   Patient Stated Goals Be able to walk, squat, and exercise with less pain.    Currently in Pain? Yes   Pain Score --  Reports pain is not that bad now, but feels tight. Last night she felt burning.    Pain Location Knee   Pain Orientation Left;Medial   Pain Descriptors / Indicators Burning;Aching   Pain Type Chronic pain   Pain Onset More than a month ago   Pain Frequency Constant      Patient unable to achieve full extension of knee in supine, PT performed lateral/medial patellar glides x 9 minutes as these were initially quite restricted, though increased ROM and decreased resistance noted after stretching.   Performed passive knee extension with towel roll underneath ankle x 10 with OP from therapist. Noted suprapatellar swelling. Patient ambulated after this stretch and reported only very minor pain on medial knee now.   Performed standing calf stretch for LLE x  8 with 15" holds   Performed prone quadricep stretch in Ely's position x 2 minutes. After completion patient ambulated and reported this was the best her knee had felt in years.   Educated patient to elevate leg and provide pressure to calf, above the knee to reduce edema. Towel roll underneath her ankle to increase knee extension ROM.                            PT Education - 04/01/16 0846    Education provided Yes   Education Details Sleeping with pillow between knees, elevating LLE to decrease swelling, stretching program to reduce passive tension.    Person(s) Educated Patient   Methods  Explanation;Demonstration;Handout   Comprehension Returned demonstration;Verbalized understanding             PT Long Term Goals - 03/26/16 1328      PT LONG TERM GOAL #1   Title Pt will be independent with HEP in order to improve strength and balance in order to decrease fall risk and improve function at home and work.    Time 8   Period Weeks   Status New     PT LONG TERM GOAL #2   Title Pt will increase LEFS by at least 9 points in order to demonstrate significant improvement in lower extremity function.    Baseline 03/25/16: 53/80   Time 8   Period Weeks   Status New     PT LONG TERM GOAL #3   Title Pt will improve hip abduction and extension MMT by at least 1/2 grade in order to demonstrate improved knee stability with things like stair descent   Baseline 03/25/16: see flowsheet   Time 8   Period Weeks   Status New     PT LONG TERM GOAL #4   Title Pt will improve hip internal and external rotation by at least 10 degrees bilaterally in order to decrease strain on knees with twisting activities   Baseline 03/25/16: see flowsheet   Time 8   Period Weeks   Status New               Plan - 04/01/16 0847    Clinical Impression Statement Patient demonstrates passive resting tightness in retinaculum, knee extensors, and unable to complete full knee extension in supine. After stretching for retinaculum, calf, and quadricep patient reports this is the best her knee has felt in years. She reports no additional symptoms at the completion of this session. Patient would benefit from stretching program to reduce suprapatellar swelling and range of motion into full extension.    Rehab Potential Good   Clinical Impairments Affecting Rehab Potential Positive: motivation, age; Negative: chronicity   PT Frequency 2x / week   PT Duration 8 weeks   PT Treatment/Interventions ADLs/Self Care Home Management;Aquatic Therapy;Electrical Stimulation;Cryotherapy;Iontophoresis '4mg'$ /ml  Dexamethasone;Moist Heat;Traction;Ultrasound;Gait training;Stair training;Therapeutic activities;Therapeutic exercise;Balance training;Neuromuscular re-education;Patient/family education;Manual techniques;Dry needling   PT Next Visit Plan Progress hip and ankle mobility, hip and knee pain-free strengthening   PT Home Exercise Plan Single leg bridges, sidelying isometric clam for ER, sidelying hip abduction, supine hip flexor stretch off side of bed   Consulted and Agree with Plan of Care Patient      Patient will benefit from skilled therapeutic intervention in order to improve the following deficits and impairments:  Decreased strength, Pain  Visit Diagnosis: Chronic pain of left knee  Muscle weakness (generalized)     Problem  List Patient Active Problem List   Diagnosis Date Noted  . Personal history of colonic polyps 02/18/2016  . Tubular adenoma of colon 01/31/2016  . Depression 01/31/2016  . Seizure disorder ([place]) 03/28/2015  . S/P hysterectomy with oophorectomy 05/27/2014  . Dyslipidemia (high LDL; low HDL) 12/01/2013  . Overweight 12/01/2013  . Headache(784.0) 10/09/2012  . Routine general medical examination at a health care facility 10/07/2012  . Verruca vulgaris 10/07/2012  . Menopause present, declines hormone replacement therapy 10/03/2011  . Hypothyroidism 10/03/2011  . Goiter 08/25/2011    Kerman Passey, PT, DPT    04/01/2016, 8:51 AM  [facility] [address] [place], [address] Phone: [phone]   Fax:  [phone]  Name: Lorraine Mills MRN: [id number] Date of Birth: April 14, 1951

## 2016-04-07 ENCOUNTER — Encounter: Payer: BLUE CROSS/BLUE SHIELD | Admitting: Physical Therapy

## 2016-04-08 ENCOUNTER — Ambulatory Visit: Payer: BLUE CROSS/BLUE SHIELD

## 2016-04-08 DIAGNOSIS — M6281 Muscle weakness (generalized): Secondary | ICD-10-CM

## 2016-04-08 DIAGNOSIS — G8929 Other chronic pain: Secondary | ICD-10-CM

## 2016-04-08 DIAGNOSIS — M25562 Pain in left knee: Principal | ICD-10-CM

## 2016-04-08 NOTE — Therapy (Signed)
[place] [facility] [address] [place], [address] Phone: [phone]   Fax:  [phone]  Physical Therapy Treatment  Patient Details  Name: Lorraine Mills MRN: [id number] Date of Birth: 09-02-1950 Referring Provider: Deborra Medina  Encounter Date: 04/08/2016      PT End of Session - 04/08/16 1134    Visit Number 3   Number of Visits 17   Date for PT Re-Evaluation 05/20/16   Authorization Type no g codes   PT Start Time 1130   PT Stop Time 1210   PT Time Calculation (min) 40 min   Activity Tolerance Patient tolerated treatment well   Behavior During Therapy [facility] for tasks assessed/performed      Past Medical History:  Diagnosis Date  . Anxiety   . Goiter   . Hyperlipidemia    TGs  . Seasonal allergies     Past Surgical History:  Procedure Laterality Date  . ABDOMINAL HYSTERECTOMY     supracervical   . BILATERAL OOPHORECTOMY  2001   BENIGN TUMOR  . BREAST BIOPSY Left 1985   cyst   . CATARACT EXTRACTION Right   . COLONOSCOPY  08/17/2009  . COLONOSCOPY WITH PROPOFOL N/A 03/19/2016   Procedure: COLONOSCOPY WITH PROPOFOL;  Surgeon: Robert Bellow, MD;  Location: [facility] ENDOSCOPY;  Service: Endoscopy;  Laterality: N/A;  . CORNEAL TRANSPLANT Right     There were no vitals filed for this visit.      Subjective Assessment - 04/08/16 1133    Subjective Pt states she is doing well at this time. Her L knee pain has improved considerably since starting therapy and especially since her last session. HEP is going well and she is consistent with performance. No specific questions or concerns at this time.    Pertinent History Pt reports a history of knee pain for at least 10 years. Initially the pain was low level but has worsened since her trip to the [place] in 7253. Pt reports she was sent to an orthopedist after that who ordered an MRI which revealed multiple meniscus tears. Pt reports her L  knee catches intermittently. No recent swelling of L knee but initially in 2014 knee was swollen after her trip. Denies redness or tenderness. Pt states she has difficulty squatting or exercising and would like to be able to increase her activity level. ROS negative for red flags.   How long can you sit comfortably? No limitation   How long can you stand comfortably? No limitation   How long can you walk comfortably? 30 minutes   Diagnostic tests MRI (2014): multiple meniscus tears per patient report   Patient Stated Goals Be able to walk, squat, and exercise with less pain.    Currently in Pain? No/denies       Manual Therapy L lateral/medial patellar glides with increased mobility and decreased resistance noted; Performed passive L knee extension with towel roll underneath ankle static hold x 30 seconds with OP from therapist x 3; L hamstring stretch 30 second hold x 3; Supine L hip flexor stretch with hip over side of mat table 30 seconds x 3; Performed prone quadricep stretch in Ely's position 30 second hold x 3;  Iontophoresis Iontophoresis with 1.0 mL dexamethasone using Iontopatch Stat self-contained system. Pt encouraged to remove after 6 hours. Discussed possible redness over area after removal. Will assess if improvement occurred at next session to see if it was beneficial.  PT Education - 04/08/16 1134    Education provided Yes   Education Details Continue current HEP and stretching program.    Person(s) Educated Patient   Methods Explanation   Comprehension Verbalized understanding             PT Long Term Goals - 03/26/16 1328      PT LONG TERM GOAL #1   Title Pt will be independent with HEP in order to improve strength and balance in order to decrease fall risk and improve function at home and work.    Time 8   Period Weeks   Status New     PT LONG TERM GOAL #2   Title Pt will increase LEFS by at least 9  points in order to demonstrate significant improvement in lower extremity function.    Baseline 03/25/16: 53/80   Time 8   Period Weeks   Status New     PT LONG TERM GOAL #3   Title Pt will improve hip abduction and extension MMT by at least 1/2 grade in order to demonstrate improved knee stability with things like stair descent   Baseline 03/25/16: see flowsheet   Time 8   Period Weeks   Status New     PT LONG TERM GOAL #4   Title Pt will improve hip internal and external rotation by at least 10 degrees bilaterally in order to decrease strain on knees with twisting activities   Baseline 03/25/16: see flowsheet   Time 8   Period Weeks   Status New               Plan - 04/08/16 1134    Clinical Impression Statement Pt arrived 10 minutes late for her appointment however was able to extend session so that full treatment session could be completed. She continues to demonstrate decreased patellar mobility during mobilizations as well as decreased quad length during prone stretches. Pt reports no pain with ambulation following therapy. She presents with pinpoint tenderness to palpation over pes anserine so iontophoresis attempted today to assess patient response and appropriateness to continue in follow-up sessions. Pt encouraged to continue HEP and follow-up as scheduled.    Rehab Potential Good   Clinical Impairments Affecting Rehab Potential Positive: motivation, age; Negative: chronicity   PT Frequency 2x / week   PT Duration 8 weeks   PT Treatment/Interventions ADLs/Self Care Home Management;Aquatic Therapy;Electrical Stimulation;Cryotherapy;Iontophoresis '4mg'$ /ml Dexamethasone;Moist Heat;Traction;Ultrasound;Gait training;Stair training;Therapeutic activities;Therapeutic exercise;Balance training;Neuromuscular re-education;Patient/family education;Manual techniques;Dry needling   PT Next Visit Plan Progress hip and ankle mobility, hip and knee pain-free strengthening, patellor mobs  and quad strengthening. Assess response to iontophoresis from last session.    PT Home Exercise Plan Single leg bridges, sidelying isometric clam for ER, sidelying hip abduction, supine hip flexor stretch off side of bed   Consulted and Agree with Plan of Care Patient      Patient will benefit from skilled therapeutic intervention in order to improve the following deficits and impairments:  Decreased strength, Pain  Visit Diagnosis: Chronic pain of left knee  Muscle weakness (generalized)     Problem List Patient Active Problem List   Diagnosis Date Noted  . Personal history of colonic polyps 02/18/2016  . Tubular adenoma of colon 01/31/2016  . Depression 01/31/2016  . Seizure disorder ([place]) 03/28/2015  . S/P hysterectomy with oophorectomy 05/27/2014  . Dyslipidemia (high LDL; low HDL) 12/01/2013  . Overweight 12/01/2013  . Headache(784.0) 10/09/2012  . Routine general medical examination at a health care  facility 10/07/2012  . Verruca vulgaris 10/07/2012  . Menopause present, declines hormone replacement therapy 10/03/2011  . Hypothyroidism 10/03/2011  . Goiter 08/25/2011    Dondrell Loudermilk 04/08/2016, 2:12 PM  [place] [facility] [address] [place], [address] Phone: [phone]   Fax:  [phone]  Name: Lorraine Mills MRN: [id number] Date of Birth: [birth year]

## 2016-04-09 ENCOUNTER — Encounter: Payer: BLUE CROSS/BLUE SHIELD | Admitting: Physical Therapy

## 2016-04-14 ENCOUNTER — Ambulatory Visit: Payer: BLUE CROSS/BLUE SHIELD | Attending: Internal Medicine | Admitting: Physical Therapy

## 2016-04-14 DIAGNOSIS — M25562 Pain in left knee: Secondary | ICD-10-CM | POA: Diagnosis present

## 2016-04-14 DIAGNOSIS — G8929 Other chronic pain: Secondary | ICD-10-CM | POA: Diagnosis present

## 2016-04-15 NOTE — Therapy (Signed)
[place] [facility] [address], [place], [address] Phone: [phone]   Fax:  [phone]  Physical Therapy Treatment  Patient Details  Name: Lorraine Mills MRN: [id number] Date of Birth: 01-25-1951 Referring Provider: Deborra Medina  Encounter Date: 04/14/2016      PT End of Session - 04/14/16 0909    Visit Number 4   Number of Visits 17   Date for PT Re-Evaluation 05/20/16   Authorization Type no g codes   PT Start Time 1130   PT Stop Time 1200   PT Time Calculation (min) 30 min   Activity Tolerance Patient tolerated treatment well   Behavior During Therapy [facility] for tasks assessed/performed      Past Medical History:  Diagnosis Date  . Anxiety   . Goiter   . Hyperlipidemia    TGs  . Seasonal allergies     Past Surgical History:  Procedure Laterality Date  . ABDOMINAL HYSTERECTOMY     supracervical   . BILATERAL OOPHORECTOMY  2001   BENIGN TUMOR  . BREAST BIOPSY Left 1985   cyst   . CATARACT EXTRACTION Right   . COLONOSCOPY  08/17/2009  . COLONOSCOPY WITH PROPOFOL N/A 03/19/2016   Procedure: COLONOSCOPY WITH PROPOFOL;  Surgeon: Robert Bellow, MD;  Location[facility] ENDOSCOPY;  Service: Endoscopy;  Laterality: N/A;  . CORNEAL TRANSPLANT Right     There were no vitals filed for this visit.      Subjective Assessment - 04/14/16 0751    Subjective Pt reports she did well with previous therapist however she has had a flareup and overall pain is worse today than since her PT started.   Pertinent History Pt reports a history of knee pain for at least 10 years. Initially the pain was low level but has worsened since her trip to the [place] in [address]. Pt reports she was sent to an orthopedist after that who ordered an MRI which revealed multiple meniscus tears. Pt reports her L knee catches intermittently. No recent swelling of L knee but initially in 2014 knee was swollen after her trip. Denies  redness or tenderness. Pt states she has difficulty squatting or exercising and would like to be able to increase her activity level. ROS negative for red flags.   How long can you sit comfortably? No limitation   How long can you stand comfortably? No limitation   How long can you walk comfortably? 30 minutes   Diagnostic tests MRI (2014): multiple meniscus tears per patient report   Patient Stated Goals Be able to walk, squat, and exercise with less pain.    Currently in Pain? Yes   Pain Score 3    Pain Location Knee   Pain Orientation Left                                 PT Education - 04/14/16 0908    Education provided Yes   Education Details progressing HEP   Person(s) Educated Patient   Methods Explanation   Comprehension Verbalized understanding             PT Long Term Goals - 03/26/16 1328      PT LONG TERM GOAL #1   Title Pt will be independent with HEP in order to improve strength and balance in order to decrease fall risk and improve function at home and work.  Time 8   Period Weeks   Status New     PT LONG TERM GOAL #2   Title Pt will increase LEFS by at least 9 points in order to demonstrate significant improvement in lower extremity function.    Baseline 03/25/16: 53/80   Time 8   Period Weeks   Status New     PT LONG TERM GOAL #3   Title Pt will improve hip abduction and extension MMT by at least 1/2 grade in order to demonstrate improved knee stability with things like stair descent   Baseline 03/25/16: see flowsheet   Time 8   Period Weeks   Status New     PT LONG TERM GOAL #4   Title Pt will improve hip internal and external rotation by at least 10 degrees bilaterally in order to decrease strain on knees with twisting activities   Baseline 03/25/16: see flowsheet   Time 8   Period Weeks   Status New               Plan - 04/14/16 0909    Clinical Impression Statement Pt tolerated session well with minimal    Rehab Potential Good   Clinical Impairments Affecting Rehab Potential Positive: motivation, age; Negative: chronicity   PT Frequency 2x / week   PT Duration 8 weeks   PT Treatment/Interventions ADLs/Self Care Home Management;Aquatic Therapy;Electrical Stimulation;Cryotherapy;Iontophoresis '4mg'$ /ml Dexamethasone;Moist Heat;Traction;Ultrasound;Gait training;Stair training;Therapeutic activities;Therapeutic exercise;Balance training;Neuromuscular re-education;Patient/family education;Manual techniques;Dry needling   PT Next Visit Plan Progress hip and ankle mobility, hip and knee pain-free strengthening, patellor mobs and quad strengthening. Assess response to iontophoresis from last session.    PT Home Exercise Plan Single leg bridges, sidelying isometric clam for ER, sidelying hip abduction, supine hip flexor stretch off side of bed   Consulted and Agree with Plan of Care Patient      Patient will benefit from skilled therapeutic intervention in order to improve the following deficits and impairments:  Decreased strength, Pain  Visit Diagnosis: Chronic pain of left knee     Problem List Patient Active Problem List   Diagnosis Date Noted  . Personal history of colonic polyps 02/18/2016  . Tubular adenoma of colon 01/31/2016  . Depression 01/31/2016  . Seizure disorder ([place]) 03/28/2015  . S/P hysterectomy with oophorectomy 05/27/2014  . Dyslipidemia (high LDL; low HDL) 12/01/2013  . Overweight 12/01/2013  . Headache(784.0) 10/09/2012  . Routine general medical examination at a health care facility 10/07/2012  . Verruca vulgaris 10/07/2012  . Menopause present, declines hormone replacement therapy 10/03/2011  . Hypothyroidism 10/03/2011  . Goiter 08/25/2011    Kendon Sedeno PT DPT 04/15/2016, 9:11 AM  [place] [facility] [address] [place], [address] Phone: [phone]   Fax:  [phone]  Name: SHIRL WEIR MRN: [id number] Date of Birth: 1951/02/06

## 2016-04-16 ENCOUNTER — Encounter: Payer: Self-pay | Admitting: Internal Medicine

## 2016-04-16 ENCOUNTER — Other Ambulatory Visit: Payer: Self-pay

## 2016-04-16 ENCOUNTER — Telehealth: Payer: Self-pay

## 2016-04-16 ENCOUNTER — Other Ambulatory Visit: Payer: Self-pay | Admitting: Internal Medicine

## 2016-04-16 ENCOUNTER — Ambulatory Visit: Payer: BLUE CROSS/BLUE SHIELD | Admitting: Physical Therapy

## 2016-04-16 DIAGNOSIS — M778 Other enthesopathies, not elsewhere classified: Secondary | ICD-10-CM

## 2016-04-16 MED ORDER — ZOLPIDEM TARTRATE ER 6.25 MG PO TBCR: 6.2500 mg | EXTENDED_RELEASE_TABLET | Freq: Every evening | ORAL | 2 refills | Status: DC | PRN

## 2016-04-16 NOTE — Telephone Encounter (Signed)
Ambien rx faxed.

## 2016-04-22 ENCOUNTER — Other Ambulatory Visit: Payer: Self-pay | Admitting: Internal Medicine

## 2016-04-28 ENCOUNTER — Ambulatory Visit: Payer: BLUE CROSS/BLUE SHIELD | Admitting: Physical Therapy

## 2016-04-28 DIAGNOSIS — M25562 Pain in left knee: Principal | ICD-10-CM

## 2016-04-28 DIAGNOSIS — G8929 Other chronic pain: Secondary | ICD-10-CM

## 2016-04-28 NOTE — Therapy (Signed)
[place] [facility] [address] [place], [address] Phone: [phone]   Fax:  [phone]  Physical Therapy Treatment/Discharge  Patient Details  Name: Lorraine Mills MRN: [id number] Date of Birth: [birth year] Referring Provider: Deborra Medina  Encounter Date: 04/28/2016      PT End of Session - 04/28/16 1315    Visit Number 5   Number of Visits 17   Date for PT Re-Evaluation 05/20/16   Authorization Type no g codes   PT Start Time 0110   PT Stop Time 0126   PT Time Calculation (min) 16 min   Activity Tolerance Patient tolerated treatment well   Behavior During Therapy [facility] for tasks assessed/performed      Past Medical History:  Diagnosis Date  . Anxiety   . Goiter   . Hyperlipidemia    TGs  . Seasonal allergies     Past Surgical History:  Procedure Laterality Date  . ABDOMINAL HYSTERECTOMY     supracervical   . BILATERAL OOPHORECTOMY  2001   BENIGN TUMOR  . BREAST BIOPSY Left 1985   cyst   . CATARACT EXTRACTION Right   . COLONOSCOPY  08/17/2009  . COLONOSCOPY WITH PROPOFOL N/A 03/19/2016   Procedure: COLONOSCOPY WITH PROPOFOL;  Surgeon: Robert Bellow, MD;  Location: [facility] ENDOSCOPY;  Service: Endoscopy;  Laterality: N/A;  . CORNEAL TRANSPLANT Right     There were no vitals filed for this visit.      Subjective Assessment - 04/28/16 1315    Subjective Pt reports "I'm good to go".   Pertinent History Pt reports a history of knee pain for at least 10 years. Initially the pain was low level but has worsened since her trip to [place] in [address]. Pt reports she was sent to an orthopedist after that who ordered an MRI which revealed multiple meniscus tears. Pt reports her L knee catches intermittently. No recent swelling of L knee but initially in 2014 knee was swollen after her trip. Denies redness or tenderness. Pt states she has difficulty squatting or exercising and would like to be  able to increase her activity level. ROS negative for red flags.   How long can you sit comfortably? No limitation   How long can you stand comfortably? No limitation   How long can you walk comfortably? 30 minutes   Diagnostic tests MRI (2014): multiple meniscus tears per patient report   Patient Stated Goals Be able to walk, squat, and exercise with less pain.    Currently in Pain? No/denies   Pain Score 0-No pain            Objective: LEFS 77/80. amb in clinic. Reassessed strengthand ROM all of which have improved considerably.  Pt is  Now able to perform sit<>stand pain free with good control.   Educated pt on continuing to avoid painful activities including very deep squat which are not appropriate in the presence of pt's meniscal tears. Also educated pt on beginning an exercise program, pt is not interested in this at this time but verbalized understanding about how mild pain is appropriate with exercise as long as it is not long lasting.                     PT Education - 04/28/16 1315    Education provided Yes   Education Details discharge instructions   Person(s) Educated Patient   Methods Explanation   Comprehension Verbalized understanding  PT Long Term Goals - 04/28/16 1327      PT LONG TERM GOAL #1   Title Pt will be independent with HEP in order to improve strength and balance in order to decrease fall risk and improve function at home and work.    Time 8   Period Weeks   Status Achieved     PT LONG TERM GOAL #2   Title Pt will increase LEFS by at least 9 points in order to demonstrate significant improvement in lower extremity function.    Baseline 03/25/16: 53/80   Time 8   Period Weeks   Status Achieved     PT LONG TERM GOAL #3   Title Pt will improve hip abduction and extension MMT by at least 1/2 grade in order to demonstrate improved knee stability with things like stair descent   Baseline 03/25/16: see flowsheet    Time 8   Period Weeks   Status Achieved     PT LONG TERM GOAL #4   Title Pt will improve hip internal and external rotation by at least 10 degrees bilaterally in order to decrease strain on knees with twisting activities   Baseline 03/25/16: see flowsheet   Time 8   Period Weeks   Status Achieved               Plan - 04/28/16 1326    Clinical Impression Statement At this time pt is appropriate for d/c as she has met all goals. Pt has no questions regarding PT. PT encouraged pt to think about beginning a regular exercise program.   Rehab Potential Good   Clinical Impairments Affecting Rehab Potential Positive: motivation, age; Negative: chronicity   PT Frequency 2x / week   PT Duration 8 weeks   PT Treatment/Interventions ADLs/Self Care Home Management;Aquatic Therapy;Electrical Stimulation;Cryotherapy;Iontophoresis 58m/ml Dexamethasone;Moist Heat;Traction;Ultrasound;Gait training;Stair training;Therapeutic activities;Therapeutic exercise;Balance training;Neuromuscular re-education;Patient/family education;Manual techniques;Dry needling   PT Next Visit Plan Progress hip and ankle mobility, hip and knee pain-free strengthening, patellor mobs and quad strengthening. Assess response to iontophoresis from last session.    PT Home Exercise Plan Single leg bridges, sidelying isometric clam for ER, sidelying hip abduction, supine hip flexor stretch off side of bed   Consulted and Agree with Plan of Care Patient      Patient will benefit from skilled therapeutic intervention in order to improve the following deficits and impairments:  Decreased strength, Pain  Visit Diagnosis: Chronic pain of left knee     Problem List Patient Active Problem List   Diagnosis Date Noted  . Personal history of colonic polyps 02/18/2016  . Tubular adenoma of colon 01/31/2016  . Depression 01/31/2016  . Seizure disorder ([place] 03/28/2015  . S/P hysterectomy with oophorectomy 05/27/2014  .  Dyslipidemia (high LDL; low HDL) 12/01/2013  . Overweight 12/01/2013  . Headache(784.0) 10/09/2012  . Routine general medical examination at a health care facility 10/07/2012  . Verruca vulgaris 10/07/2012  . Menopause present, declines hormone replacement therapy 10/03/2011  . Hypothyroidism 10/03/2011  . Goiter 08/25/2011    Minami Arriaga PT DPT 04/28/2016, 1:29 PM  [facility] [address] [place] [address]Phone: [phone]  Fax:  [phone] Name: RTIM WILHIDEMRN: [id number] of Birth: [birth year]

## 2016-07-02 DIAGNOSIS — F331 Major depressive disorder, recurrent, moderate: Secondary | ICD-10-CM | POA: Diagnosis not present

## 2016-07-02 DIAGNOSIS — F429 Obsessive-compulsive disorder, unspecified: Secondary | ICD-10-CM | POA: Diagnosis not present

## 2016-07-02 DIAGNOSIS — F411 Generalized anxiety disorder: Secondary | ICD-10-CM | POA: Diagnosis not present

## 2016-08-25 DIAGNOSIS — H2512 Age-related nuclear cataract, left eye: Secondary | ICD-10-CM | POA: Diagnosis not present

## 2016-08-25 DIAGNOSIS — Z947 Corneal transplant status: Secondary | ICD-10-CM | POA: Diagnosis not present

## 2016-08-25 DIAGNOSIS — Z961 Presence of intraocular lens: Secondary | ICD-10-CM | POA: Diagnosis not present

## 2016-08-25 DIAGNOSIS — H04123 Dry eye syndrome of bilateral lacrimal glands: Secondary | ICD-10-CM | POA: Diagnosis not present

## 2016-08-25 DIAGNOSIS — H18452 Nodular corneal degeneration, left eye: Secondary | ICD-10-CM | POA: Diagnosis not present

## 2016-08-27 DIAGNOSIS — F411 Generalized anxiety disorder: Secondary | ICD-10-CM | POA: Diagnosis not present

## 2016-08-27 DIAGNOSIS — F331 Major depressive disorder, recurrent, moderate: Secondary | ICD-10-CM | POA: Diagnosis not present

## 2016-08-27 DIAGNOSIS — F429 Obsessive-compulsive disorder, unspecified: Secondary | ICD-10-CM | POA: Diagnosis not present

## 2016-09-23 ENCOUNTER — Ambulatory Visit (INDEPENDENT_AMBULATORY_CARE_PROVIDER_SITE_OTHER): Payer: BLUE CROSS/BLUE SHIELD | Admitting: Internal Medicine

## 2016-09-23 ENCOUNTER — Encounter: Payer: Self-pay | Admitting: Internal Medicine

## 2016-09-23 VITALS — BP 120/76 | HR 74 | Temp 98.5°F | Resp 15 | Ht 61.0 in | Wt 165.0 lb

## 2016-09-23 DIAGNOSIS — E039 Hypothyroidism, unspecified: Secondary | ICD-10-CM

## 2016-09-23 DIAGNOSIS — E663 Overweight: Secondary | ICD-10-CM

## 2016-09-23 DIAGNOSIS — Z79899 Other long term (current) drug therapy: Secondary | ICD-10-CM

## 2016-09-23 DIAGNOSIS — F324 Major depressive disorder, single episode, in partial remission: Secondary | ICD-10-CM | POA: Diagnosis not present

## 2016-09-23 DIAGNOSIS — E785 Hyperlipidemia, unspecified: Secondary | ICD-10-CM | POA: Diagnosis not present

## 2016-09-23 LAB — COMPREHENSIVE METABOLIC PANEL
ALK PHOS: 38 U/L — AB (ref 39–117)
ALT: 18 U/L (ref 0–35)
AST: 21 U/L (ref 0–37)
Albumin: 4.5 g/dL (ref 3.5–5.2)
BUN: 20 mg/dL (ref 6–23)
CHLORIDE: 105 meq/L (ref 96–112)
CO2: 27 mEq/L (ref 19–32)
CREATININE: 0.94 mg/dL (ref 0.40–1.20)
Calcium: 9.7 mg/dL (ref 8.4–10.5)
GFR: 63.43 mL/min (ref >60.00)
GLUCOSE: 106 mg/dL — AB (ref 70–99)
POTASSIUM: 4.1 meq/L (ref 3.5–5.1)
SODIUM: 139 meq/L (ref 135–145)
TOTAL PROTEIN: 7.2 g/dL (ref 6.0–8.3)
Total Bilirubin: 0.4 mg/dL (ref 0.2–1.2)

## 2016-09-23 LAB — CBC WITH DIFFERENTIAL/PLATELET
BASOS PCT: 0.7 % (ref 0.0–3.0)
Basophils Absolute: 0 10*3/uL (ref 0.0–0.1)
EOS ABS: 0.1 10*3/uL (ref 0.0–0.7)
EOS PCT: 1.1 % (ref 0.0–5.0)
HCT: 42.3 % (ref 36.0–46.0)
Hemoglobin: 14.4 g/dL (ref 12.0–15.0)
LYMPHS ABS: 2.2 10*3/uL (ref 0.7–4.0)
Lymphocytes Relative: 34.9 % (ref 12.0–46.0)
MCHC: 34 g/dL (ref 30.0–36.0)
MCV: 93.7 fl (ref 78.0–100.0)
MONO ABS: 0.5 10*3/uL (ref 0.1–1.0)
Monocytes Relative: 8.3 % (ref 3.0–12.0)
NEUTROS PCT: 55 % (ref 43.0–77.0)
Neutro Abs: 3.4 10*3/uL (ref 1.4–7.7)
Platelets: 245 10*3/uL (ref 150.0–400.0)
RBC: 4.51 Mil/uL (ref 3.87–5.11)
RDW: 12.9 % (ref 11.5–15.5)
WBC: 6.2 10*3/uL (ref 4.0–10.5)

## 2016-09-23 LAB — LIPID PANEL
Cholesterol: 226 mg/dL — ABNORMAL HIGH (ref 0–200)
HDL: 40.3 mg/dL (ref >39.00)
LDL Cholesterol: 147 mg/dL — ABNORMAL HIGH (ref 0–99)
NONHDL: 186.01
Total CHOL/HDL Ratio: 6
Triglycerides: 197 mg/dL — ABNORMAL HIGH (ref 0.0–149.0)
VLDL: 39.4 mg/dL (ref 0.0–40.0)

## 2016-09-23 LAB — TSH: TSH: 2.1 u[IU]/mL (ref 0.35–4.50)

## 2016-09-23 MED ORDER — ALPRAZOLAM 0.5 MG PO TABS: 0.5000 mg | ORAL_TABLET | Freq: Every evening | ORAL | 3 refills | Status: DC | PRN

## 2016-09-23 MED ORDER — TRAZODONE HCL 50 MG PO TABS: 25.0000 mg | ORAL_TABLET | Freq: Every evening | ORAL | 3 refills | Status: DC | PRN

## 2016-09-23 NOTE — Patient Instructions (Addendum)
Please start walking or riding a bike for 30 minutes 3 to 5 times per week for exercise,   I am prescribing trazodone as a nightly sleep aid ,  Start with 1/2 tablet one hour before bedtime .  You can increase to a full tablet if needed.  This is not addicting and would be a better choice that the other ones you have used

## 2016-09-23 NOTE — Progress Notes (Signed)
Subjective:  Patient ID: Lorraine Mills, female    DOB: 07/07/1950  Age: 66 y.o. MRN: [id number]  CC: The primary encounter diagnosis was Dyslipidemia (high LDL; low HDL). Diagnoses of Acquired hypothyroidism, Long-term use of high-risk medication, Overweight, and Major depressive disorder with single episode, in partial remission [facility]) were also pertinent to this visit.  HPI Lorraine Mills presents for follow up on depression,  Hypothyroid,  And hyperlipdiemia   Not exercising but losing weight taking an "all natural " supplement for weight loss called [employer] . Since January Has increased her energy level.  Feels like a different person  All positive changes ,  Has lost 8 lbs by her  home scales   Migraines have stopped once she retired. 5 years ago,  Seeing Dr. Nicolasa Ducking for management of depression.  Using alprazolam 1/2 table as needed,  Not daily, for insomnia. instead of ambien for sleep . Dr Nicolasa Ducking prescribes the ambien 6.25 mg  Lab Results  Component Value Date   TSH 2.10 09/23/2016   Fasting today  Lab Results  Component Value Date   CHOL 226 (H) 09/23/2016   HDL 40.30 09/23/2016   LDLCALC 147 (H) 09/23/2016   LDLDIRECT 141.0 01/29/2016   TRIG 197.0 (H) 09/23/2016   CHOLHDL 6 09/23/2016   No results found for: HGBA1C      Outpatient Medications Prior to Visit  Medication Sig Dispense Refill  . fenofibrate (TRICOR) 48 MG tablet TAKE ONE TABLET BY MOUTH EVERY DAY -MUSTBE SEEN FOR FURTHER REFILLS 90 tablet 1  . fexofenadine-pseudoephedrine (ALLEGRA-D 24) 180-240 MG per 24 hr tablet Take 1 tablet by mouth daily.    . fluticasone (FLONASE) 50 MCG/ACT nasal spray Place 2 sprays into the nose daily.    Marland Kitchen levothyroxine (SYNTHROID, LEVOTHROID) 75 MCG tablet TAKE ONE TABLET BY MOUTH EVERY DAY 90 tablet 2  . prednisoLONE acetate (PRED FORTE) 1 % ophthalmic suspension Place 1 drop into the right eye 4 (four) times daily.    Marland Kitchen ALPRAZolam (XANAX) 0.5 MG tablet Take 0.5 mg by mouth at  bedtime as needed for anxiety.    Marland Kitchen escitalopram (LEXAPRO) 20 MG tablet TAKE ONE TABLET BY MOUTH EVERY DAY (Patient not taking: Reported on 09/23/2016) 90 tablet 0  . meloxicam (MOBIC) 15 MG tablet Take 1 tablet (15 mg total) by mouth daily. (Patient not taking: Reported on 09/23/2016) 30 tablet 4  . zolpidem (AMBIEN CR) 6.25 MG CR tablet Take 1 tablet (6.25 mg total) by mouth at bedtime as needed for sleep. 30 tablet 2  . PARoxetine (PAXIL) 10 MG tablet Take 1 tablet (10 mg total) by mouth daily. (Patient not taking: Reported on 03/25/2016) 30 tablet 2   No facility-administered medications prior to visit.     Review of Systems;  Patient denies headache, fevers, malaise, unintentional weight loss, skin rash, eye pain, sinus congestion and sinus pain, sore throat, dysphagia,  hemoptysis , cough, dyspnea, wheezing, chest pain, palpitations, orthopnea, edema, abdominal pain, nausea, melena, diarrhea, constipation, flank pain, dysuria, hematuria, urinary  Frequency, nocturia, numbness, tingling, seizures,  Focal weakness, Loss of consciousness,  Tremor, insomnia, depression, anxiety, and suicidal ideation.      Objective:  BP 120/76   Pulse 74   Temp 98.5 F (36.9 C) (Oral)   Resp 15   Ht '5\' 1"'$  (1.549 m)   Wt 165 lb (74.8 kg)   SpO2 93%   BMI 31.18 kg/m   BP Readings from Last 3 Encounters:  09/23/16  120/76  03/19/16 116/76  02/18/16 124/74    Wt Readings from Last 3 Encounters:  09/23/16 165 lb (74.8 kg)  03/19/16 163 lb (73.9 kg)  02/18/16 169 lb (76.7 kg)    General appearance: alert, cooperative and appears stated age Ears: normal TM's and external ear canals both ears Throat: lips, mucosa, and tongue normal; teeth and gums normal Neck: no adenopathy, no carotid bruit, supple, symmetrical, trachea midline and thyroid not enlarged, symmetric, no tenderness/mass/nodules Back: symmetric, no curvature. ROM normal. No CVA tenderness. Lungs: clear to auscultation  bilaterally Heart: regular rate and rhythm, S1, S2 normal, no murmur, click, rub or gallop Abdomen: soft, non-tender; bowel sounds normal; no masses,  no organomegaly Pulses: 2+ and symmetric Skin: Skin color, texture, turgor normal. No rashes or lesions Lymph nodes: Cervical, supraclavicular, and axillary nodes normal.  No results found for: HGBA1C  Lab Results  Component Value Date   CREATININE 0.94 09/23/2016   CREATININE 0.95 01/29/2016   CREATININE 1.02 (H) 03/25/2015    Lab Results  Component Value Date   WBC 6.2 09/23/2016   HGB 14.4 09/23/2016   HCT 42.3 09/23/2016   PLT 245.0 09/23/2016   GLUCOSE 106 (H) 09/23/2016   CHOL 226 (H) 09/23/2016   TRIG 197.0 (H) 09/23/2016   HDL 40.30 09/23/2016   LDLDIRECT 141.0 01/29/2016   LDLCALC 147 (H) 09/23/2016   ALT 18 09/23/2016   AST 21 09/23/2016   NA 139 09/23/2016   K 4.1 09/23/2016   CL 105 09/23/2016   CREATININE 0.94 09/23/2016   BUN 20 09/23/2016   CO2 27 09/23/2016   TSH 2.10 09/23/2016    Mm Outside Films Mammo  Result Date: 02/28/2016 This examination belongs to an outside facility and is stored here for comparison purposes only.  Contact the originating outside institution for any associated report or interpretation.   Assessment & Plan:   Problem List Items Addressed This Visit    Depression    Now managed with zoloft and ambien prescribed by Dr. Nicolasa Ducking.   The risks and benefits of benzodiazepine use were discussed with patient today including excessive sedation leading to respiratory depression,  impaired thinking/driving, and addiction.  Patient was advised to try trazodone instead,       Relevant Medications   sertraline (ZOLOFT) 50 MG tablet   traZODone (DESYREL) 50 MG tablet   ALPRAZolam (XANAX) 0.5 MG tablet   Dyslipidemia (high LDL; low HDL) - Primary    Taking fenofibrate.  Using the Framingham risk calculator,  her 10 year risk of coronary artery disease is 9.7%.will recheck in 6 months after  weight loss/      Relevant Orders   Lipid panel (Completed)   Hypothyroidism    Thyroid function is WNL on current dose.  No current changes needed.   Lab Results  Component Value Date   TSH 2.10 09/23/2016         Relevant Orders   TSH (Completed)   Overweight    I have congratulated her in reduction of   BMI and encouraged  Continued weight loss with goal of 10% of body weigh over the next 6 months using a low glycemic index diet and regular exercise a minimum of 5 days per week.         Other Visit Diagnoses    Long-term use of high-risk medication       Relevant Orders   Comprehensive metabolic panel (Completed)   CBC with Differential/Platelet (Completed)    A  total of 25 minutes of face to face time was spent with patient more than half of which was spent in counselling about the above mentioned conditions  and coordination of care   I have discontinued Ms. Graig PARoxetine. I have also changed her ALPRAZolam. Additionally, I am having her start on traZODone. Lastly, I am having her maintain her fexofenadine-pseudoephedrine, fluticasone, prednisoLONE acetate, escitalopram, meloxicam, levothyroxine, zolpidem, fenofibrate, zolpidem, and sertraline.  Meds ordered this encounter  Medications  . zolpidem (AMBIEN CR) 6.25 MG CR tablet    Refill:  2  . sertraline (ZOLOFT) 50 MG tablet    Refill:  0  . traZODone (DESYREL) 50 MG tablet    Sig: Take 0.5-1 tablets (25-50 mg total) by mouth at bedtime as needed for sleep.    Dispense:  30 tablet    Refill:  3  . ALPRAZolam (XANAX) 0.5 MG tablet    Sig: Take 1 tablet (0.5 mg total) by mouth at bedtime as needed for anxiety.    Dispense:  30 tablet    Refill:  3    Medications Discontinued During This Encounter  Medication Reason  . PARoxetine (PAXIL) 10 MG tablet Patient has not taken in last 30 days  . ALPRAZolam (XANAX) 0.5 MG tablet Reorder    Follow-up: Return in about 6 months (around 03/25/2017) for  CPE.   [name], MD

## 2016-09-23 NOTE — Progress Notes (Signed)
Pre visit review using our clinic review tool, if applicable. No additional management support is needed unless otherwise documented below in the visit note. 

## 2016-09-25 ENCOUNTER — Encounter: Payer: Self-pay | Admitting: Internal Medicine

## 2016-09-25 MED ORDER — FENOFIBRATE 48 MG PO TABS: ORAL_TABLET | ORAL | 1 refills | Status: DC

## 2016-09-25 NOTE — Assessment & Plan Note (Signed)
Taking fenofibrate.  Using the Framingham risk calculator,  her 10 year risk of coronary artery disease is 9.7%.will recheck in 6 months after weight loss/

## 2016-09-25 NOTE — Assessment & Plan Note (Signed)
I have congratulated her in reduction of   BMI and encouraged  Continued weight loss with goal of 10% of body weigh over the next 6 months using a low glycemic index diet and regular exercise a minimum of 5 days per week.    

## 2016-09-25 NOTE — Assessment & Plan Note (Signed)
Thyroid function is WNL on current dose.  No current changes needed.   Lab Results  Component Value Date   TSH 2.10 09/23/2016

## 2016-09-25 NOTE — Assessment & Plan Note (Addendum)
Now managed with zoloft and ambien prescribed by Dr. Nicolasa Ducking.   The risks and benefits of benzodiazepine use were discussed with patient today including excessive sedation leading to respiratory depression,  impaired thinking/driving, and addiction.  Patient was advised to try trazodone instead,

## 2016-11-13 ENCOUNTER — Encounter: Payer: Self-pay | Admitting: Internal Medicine

## 2016-11-16 ENCOUNTER — Encounter: Payer: Self-pay | Admitting: Internal Medicine

## 2016-11-16 DIAGNOSIS — D492 Neoplasm of unspecified behavior of bone, soft tissue, and skin: Secondary | ICD-10-CM

## 2016-12-02 DIAGNOSIS — F411 Generalized anxiety disorder: Secondary | ICD-10-CM | POA: Diagnosis not present

## 2016-12-02 DIAGNOSIS — F331 Major depressive disorder, recurrent, moderate: Secondary | ICD-10-CM | POA: Diagnosis not present

## 2016-12-08 ENCOUNTER — Other Ambulatory Visit: Payer: Self-pay | Admitting: Internal Medicine

## 2016-12-08 DIAGNOSIS — L821 Other seborrheic keratosis: Secondary | ICD-10-CM | POA: Diagnosis not present

## 2016-12-08 DIAGNOSIS — L820 Inflamed seborrheic keratosis: Secondary | ICD-10-CM | POA: Diagnosis not present

## 2016-12-08 DIAGNOSIS — L237 Allergic contact dermatitis due to plants, except food: Secondary | ICD-10-CM | POA: Diagnosis not present

## 2016-12-08 DIAGNOSIS — D1800 Hemangioma unspecified site: Secondary | ICD-10-CM | POA: Diagnosis not present

## 2016-12-08 DIAGNOSIS — D229 Melanocytic nevi, unspecified: Secondary | ICD-10-CM | POA: Diagnosis not present

## 2016-12-08 DIAGNOSIS — L812 Freckles: Secondary | ICD-10-CM | POA: Diagnosis not present

## 2017-03-09 DIAGNOSIS — F429 Obsessive-compulsive disorder, unspecified: Secondary | ICD-10-CM | POA: Diagnosis not present

## 2017-03-09 DIAGNOSIS — F331 Major depressive disorder, recurrent, moderate: Secondary | ICD-10-CM | POA: Diagnosis not present

## 2017-03-09 DIAGNOSIS — F411 Generalized anxiety disorder: Secondary | ICD-10-CM | POA: Diagnosis not present

## 2017-03-25 ENCOUNTER — Encounter: Payer: Medicare Other | Admitting: Internal Medicine

## 2017-04-23 ENCOUNTER — Other Ambulatory Visit: Payer: Self-pay | Admitting: Internal Medicine

## 2017-05-25 DIAGNOSIS — Z947 Corneal transplant status: Secondary | ICD-10-CM | POA: Diagnosis not present

## 2017-05-25 DIAGNOSIS — H18452 Nodular corneal degeneration, left eye: Secondary | ICD-10-CM | POA: Diagnosis not present

## 2017-05-25 DIAGNOSIS — H2512 Age-related nuclear cataract, left eye: Secondary | ICD-10-CM | POA: Diagnosis not present

## 2017-05-25 DIAGNOSIS — H40013 Open angle with borderline findings, low risk, bilateral: Secondary | ICD-10-CM | POA: Diagnosis not present

## 2017-06-08 ENCOUNTER — Other Ambulatory Visit: Payer: Self-pay | Admitting: Internal Medicine

## 2017-06-08 NOTE — Telephone Encounter (Signed)
Lab Results  Component Value Date   TSH 2.10 09/23/2016   Refilled for 90 days needs tsh and other labs prior to refill

## 2017-06-08 NOTE — Telephone Encounter (Signed)
Refilled: 12/08/2016 Last OV: 09/23/2016 Next OV: not scheduled Last TSH: 09/23/2016

## 2017-06-16 ENCOUNTER — Telehealth: Payer: Self-pay

## 2017-06-16 DIAGNOSIS — E785 Hyperlipidemia, unspecified: Secondary | ICD-10-CM

## 2017-06-16 DIAGNOSIS — Z79899 Other long term (current) drug therapy: Secondary | ICD-10-CM

## 2017-06-16 DIAGNOSIS — E039 Hypothyroidism, unspecified: Secondary | ICD-10-CM

## 2017-06-16 DIAGNOSIS — R7301 Impaired fasting glucose: Secondary | ICD-10-CM

## 2017-06-16 NOTE — Addendum Note (Signed)
Addended by: [name] on: 06/16/2017 12:36 PM   Modules accepted: Orders

## 2017-06-16 NOTE — Telephone Encounter (Signed)
Spoke with pt and was able to schedule her both a lab appt and an office visit with Dr. Derrel Nip. Pt is aware of both appt dates and times.

## 2017-06-16 NOTE — Telephone Encounter (Signed)
GREAT!  I ADDED AN a1C (YOU WOULDN'T HAVE KNOWN TO DO THAT

## 2017-06-16 NOTE — Telephone Encounter (Signed)
Pt is scheduled for lab work in March. Ordered a lipid panel, CMP, TSH and CBC. Is there anything else that needs to be ordered?

## 2017-07-07 DIAGNOSIS — F429 Obsessive-compulsive disorder, unspecified: Secondary | ICD-10-CM | POA: Diagnosis not present

## 2017-07-07 DIAGNOSIS — F331 Major depressive disorder, recurrent, moderate: Secondary | ICD-10-CM | POA: Diagnosis not present

## 2017-07-07 DIAGNOSIS — F411 Generalized anxiety disorder: Secondary | ICD-10-CM | POA: Diagnosis not present

## 2017-07-23 DIAGNOSIS — H18452 Nodular corneal degeneration, left eye: Secondary | ICD-10-CM | POA: Diagnosis not present

## 2017-07-23 DIAGNOSIS — H40013 Open angle with borderline findings, low risk, bilateral: Secondary | ICD-10-CM | POA: Diagnosis not present

## 2017-07-23 DIAGNOSIS — H1131 Conjunctival hemorrhage, right eye: Secondary | ICD-10-CM | POA: Diagnosis not present

## 2017-07-23 DIAGNOSIS — H2512 Age-related nuclear cataract, left eye: Secondary | ICD-10-CM | POA: Diagnosis not present

## 2017-07-23 DIAGNOSIS — Z947 Corneal transplant status: Secondary | ICD-10-CM | POA: Diagnosis not present

## 2017-08-21 ENCOUNTER — Other Ambulatory Visit: Payer: Self-pay | Admitting: Internal Medicine

## 2017-09-01 ENCOUNTER — Other Ambulatory Visit (INDEPENDENT_AMBULATORY_CARE_PROVIDER_SITE_OTHER): Payer: BLUE CROSS/BLUE SHIELD

## 2017-09-01 DIAGNOSIS — Z79899 Other long term (current) drug therapy: Secondary | ICD-10-CM | POA: Diagnosis not present

## 2017-09-01 DIAGNOSIS — E039 Hypothyroidism, unspecified: Secondary | ICD-10-CM | POA: Diagnosis not present

## 2017-09-01 DIAGNOSIS — E785 Hyperlipidemia, unspecified: Secondary | ICD-10-CM | POA: Diagnosis not present

## 2017-09-01 DIAGNOSIS — R7301 Impaired fasting glucose: Secondary | ICD-10-CM

## 2017-09-01 LAB — CBC WITH DIFFERENTIAL/PLATELET
BASOS ABS: 0 10*3/uL (ref 0.0–0.1)
Basophils Relative: 1 % (ref 0.0–3.0)
EOS ABS: 0.1 10*3/uL (ref 0.0–0.7)
Eosinophils Relative: 1.9 % (ref 0.0–5.0)
HEMATOCRIT: 41.4 % (ref 36.0–46.0)
Hemoglobin: 14.4 g/dL (ref 12.0–15.0)
LYMPHS PCT: 38.7 % (ref 12.0–46.0)
Lymphs Abs: 1.8 10*3/uL (ref 0.7–4.0)
MCHC: 34.8 g/dL (ref 30.0–36.0)
MCV: 93.7 fl (ref 78.0–100.0)
Monocytes Absolute: 0.5 10*3/uL (ref 0.1–1.0)
Monocytes Relative: 10.4 % (ref 3.0–12.0)
NEUTROS ABS: 2.2 10*3/uL (ref 1.4–7.7)
NEUTROS PCT: 48 % (ref 43.0–77.0)
PLATELETS: 217 10*3/uL (ref 150.0–400.0)
RBC: 4.42 Mil/uL (ref 3.87–5.11)
RDW: 12.7 % (ref 11.5–15.5)
WBC: 4.6 10*3/uL (ref 4.0–10.5)

## 2017-09-01 LAB — COMPREHENSIVE METABOLIC PANEL
ALT: 19 U/L (ref 0–35)
AST: 18 U/L (ref 0–37)
Albumin: 4 g/dL (ref 3.5–5.2)
Alkaline Phosphatase: 34 U/L — ABNORMAL LOW (ref 39–117)
BILIRUBIN TOTAL: 0.3 mg/dL (ref 0.2–1.2)
BUN: 15 mg/dL (ref 6–23)
CALCIUM: 9.4 mg/dL (ref 8.4–10.5)
CO2: 28 meq/L (ref 19–32)
CREATININE: 0.87 mg/dL (ref 0.40–1.20)
Chloride: 107 mEq/L (ref 96–112)
GFR: 69.16 mL/min (ref >60.00)
GLUCOSE: 126 mg/dL — AB (ref 70–99)
Potassium: 4.5 mEq/L (ref 3.5–5.1)
Sodium: 142 mEq/L (ref 135–145)
TOTAL PROTEIN: 6.8 g/dL (ref 6.0–8.3)

## 2017-09-01 LAB — LDL CHOLESTEROL, DIRECT: LDL DIRECT: 117 mg/dL

## 2017-09-01 LAB — LIPID PANEL
Cholesterol: 199 mg/dL (ref 0–200)
HDL: 37.8 mg/dL — ABNORMAL LOW (ref >39.00)
NONHDL: 160.84
Total CHOL/HDL Ratio: 5
Triglycerides: 201 mg/dL — ABNORMAL HIGH (ref 0.0–149.0)
VLDL: 40.2 mg/dL — ABNORMAL HIGH (ref 0.0–40.0)

## 2017-09-01 LAB — HEMOGLOBIN A1C: HEMOGLOBIN A1C: 5.6 % (ref 4.6–6.5)

## 2017-09-01 LAB — TSH: TSH: 3.37 u[IU]/mL (ref 0.35–4.50)

## 2017-09-03 ENCOUNTER — Encounter: Payer: Self-pay | Admitting: Internal Medicine

## 2017-09-03 ENCOUNTER — Other Ambulatory Visit: Payer: Self-pay | Admitting: Internal Medicine

## 2017-09-03 DIAGNOSIS — R7301 Impaired fasting glucose: Secondary | ICD-10-CM | POA: Insufficient documentation

## 2017-09-24 ENCOUNTER — Encounter: Payer: Self-pay | Admitting: Internal Medicine

## 2017-09-24 ENCOUNTER — Ambulatory Visit (INDEPENDENT_AMBULATORY_CARE_PROVIDER_SITE_OTHER): Payer: BLUE CROSS/BLUE SHIELD | Admitting: Internal Medicine

## 2017-09-24 VITALS — BP 120/76 | HR 70 | Temp 98.2°F | Resp 14 | Ht 61.0 in | Wt 165.0 lb

## 2017-09-24 DIAGNOSIS — R7301 Impaired fasting glucose: Secondary | ICD-10-CM

## 2017-09-24 DIAGNOSIS — Z23 Encounter for immunization: Secondary | ICD-10-CM

## 2017-09-24 DIAGNOSIS — L659 Nonscarring hair loss, unspecified: Secondary | ICD-10-CM

## 2017-09-24 DIAGNOSIS — E039 Hypothyroidism, unspecified: Secondary | ICD-10-CM

## 2017-09-24 DIAGNOSIS — E119 Type 2 diabetes mellitus without complications: Secondary | ICD-10-CM

## 2017-09-24 DIAGNOSIS — Z780 Asymptomatic menopausal state: Secondary | ICD-10-CM

## 2017-09-24 DIAGNOSIS — Z1231 Encounter for screening mammogram for malignant neoplasm of breast: Secondary | ICD-10-CM

## 2017-09-24 DIAGNOSIS — Z0000 Encounter for general adult medical examination without abnormal findings: Secondary | ICD-10-CM | POA: Diagnosis not present

## 2017-09-24 DIAGNOSIS — Z1239 Encounter for other screening for malignant neoplasm of breast: Secondary | ICD-10-CM

## 2017-09-24 LAB — MICROALBUMIN / CREATININE URINE RATIO
CREATININE, U: 84 mg/dL
MICROALB UR: 0.1 mg/dL (ref 0.0–1.9)
MICROALB/CREAT RATIO: 0.1 mg/g (ref 0.0–30.0)

## 2017-09-24 NOTE — Progress Notes (Addendum)
Patient ID: Lorraine Mills, female    DOB: 04-09-1951  Age: 67 y.o. MRN: [id number]  The patient is here for annual preventive  examination and management of other chronic and acute problems. Including NEW ONSET DIET CONTROLLED DM , hyperlipidemia , hypothyroid  Last CPE 2017 Last mammo sept 2017 Needs DEXA, Prevnar,  Foot exam ,  Eye exan and urine   Hair thinning for the past 2 months  [facility].    The risk factors are reflected in the social history.  The roster of all physicians providing medical care to patient - is listed in the Snapshot section of the chart.  Activities of daily living:  The patient is 100% independent in all ADLs: dressing, toileting, feeding as well as independent mobility  Home safety : The patient has smoke detectors in the home. They wear seatbelts.  There are no firearms at home. There is no violence in the home.   There is no risks for hepatitis, STDs or HIV. There is no   history of blood transfusion. They have no travel history to infectious disease endemic areas of the world.  The patient has seen their dentist in the last six month. They have seen their eye doctor in the last year. They admit to slight hearing difficulty with regard to whispered voices and some television programs.  They have deferred audiologic testing in the last year.  They do not  have excessive sun exposure. Discussed the need for sun protection: hats, long sleeves and use of sunscreen if there is significant sun exposure.   Diet: the importance of a healthy diet is discussed. They do have a healthy diet.  The benefits of regular aerobic exercise were discussed. She walks 4 times per week ,  20 minutes.   Depression screen: there are no signs or vegative symptoms of depression- irritability, change in appetite, anhedonia, sadness/tearfullness.  Cognitive assessment: the patient manages all their financial and personal affairs and is actively engaged. They could relate day,date,year  and events; recalled 2/3 objects at 3 minutes; performed clock-face test normally.  The following portions of the patient's history were reviewed and updated as appropriate: allergies, current medications, past family history, past medical history,  past surgical history, past social history  and problem list.  Visual acuity was not assessed per patient preference since she has regular follow up with her ophthalmologist. Hearing and body mass index were assessed and reviewed.   During the course of the visit the patient was educated and counseled about appropriate screening and preventive services including : fall prevention , diabetes screening, nutrition counseling, colorectal cancer screening, and recommended immunizations.    CC: The primary encounter diagnosis was Controlled type 2 diabetes mellitus without complication, without long-term current use of insulin ([place]). Diagnoses of Breast cancer screening, Postmenopausal estrogen deficiency, Impaired fasting glucose, Hair loss, Need for vaccination with 13-polyvalent pneumococcal conjugate vaccine, Acquired hypothyroidism, and Routine general medical examination at a health care facility were also pertinent to this visit.  History Lorraine Mills has a past medical history of Anxiety, Goiter, Hyperlipidemia, and Seasonal allergies.   She has a past surgical history that includes Bilateral oophorectomy (2001); Abdominal hysterectomy; Corneal transplant (Right); Cataract extraction (Right); Colonoscopy (08/17/2009); Breast biopsy (Left, 1985); and Colonoscopy with propofol (N/A, 03/19/2016).   Her family history includes Breast cancer (age of onset: 72) in her sister; Cancer in her mother; Stroke in her father; Thyroid disease in her other.She reports that she quit smoking about 19 years  ago. Her smoking use included cigarettes. She quit after 24.00 years of use. She has never used smokeless tobacco. She reports that she drinks alcohol. She reports that she  does not use drugs.  Outpatient Medications Prior to Visit  Medication Sig Dispense Refill  . ALPRAZolam (XANAX) 0.5 MG tablet Take 1 tablet (0.5 mg total) by mouth at bedtime as needed for anxiety. 30 tablet 3  . fenofibrate (TRICOR) 48 MG tablet TAKE ONE TABLET BY MOUTH EVERY DAY 90 tablet 1  . fexofenadine-pseudoephedrine (ALLEGRA-D 24) 180-240 MG per 24 hr tablet Take 1 tablet by mouth daily.    . fluticasone (FLONASE) 50 MCG/ACT nasal spray Place 2 sprays into the nose daily.    Marland Kitchen levothyroxine (SYNTHROID, LEVOTHROID) 75 MCG tablet TAKE ONE TABLET BY MOUTH EVERY DAY 90 tablet 1  . prednisoLONE acetate (PRED FORTE) 1 % ophthalmic suspension Place 1 drop into the right eye 4 (four) times daily.    . sertraline (ZOLOFT) 50 MG tablet   0  . traZODone (DESYREL) 50 MG tablet Take 0.5-1 tablets (25-50 mg total) by mouth at bedtime as needed for sleep. 30 tablet 3  . zolpidem (AMBIEN CR) 6.25 MG CR tablet   2  . meloxicam (MOBIC) 15 MG tablet Take 1 tablet (15 mg total) by mouth daily. (Patient not taking: Reported on 09/24/2017) 30 tablet 4  . zolpidem (AMBIEN CR) 6.25 MG CR tablet Take 1 tablet (6.25 mg total) by mouth at bedtime as needed for sleep. 30 tablet 2   No facility-administered medications prior to visit.     Review of Systems   Patient denies headache, fevers, malaise, unintentional weight loss, skin rash, eye pain, sinus congestion and sinus pain, sore throat, dysphagia,  hemoptysis , cough, dyspnea, wheezing, chest pain, palpitations, orthopnea, edema, abdominal pain, nausea, melena, diarrhea, constipation, flank pain, dysuria, hematuria, urinary  Frequency, nocturia, numbness, tingling, seizures,  Focal weakness, Loss of consciousness,  Tremor, insomnia, depression, anxiety, and suicidal ideation.      Objective:  BP 120/76 (BP Location: Left Arm, Patient Position: Sitting, Cuff Size: Normal)   Pulse 70   Temp 98.2 F (36.8 C) (Oral)   Resp 14   Ht 5\' 1"  (1.549 m)   Wt  165 lb (74.8 kg)   SpO2 95%   BMI 31.18 kg/m   Physical Exam   General appearance: alert, cooperative and appears stated age Head: Normocephalic, without obvious abnormality, atraumatic Eyes: conjunctivae/corneas clear. PERRL, EOM's intact. Fundi benign. Ears: normal TM's and external ear canals both ears Nose: Nares normal. Septum midline. Mucosa normal. No drainage or sinus tenderness. Throat: lips, mucosa, and tongue normal; teeth and gums normal Neck: no adenopathy, no carotid bruit, no JVD, supple, symmetrical, trachea midline and thyroid not enlarged, symmetric, no tenderness/mass/nodules Lungs: clear to auscultation bilaterally Breasts: normal appearance, no masses or tenderness Heart: regular rate and rhythm, S1, S2 normal, no murmur, click, rub or gallop Abdomen: soft, non-tender; bowel sounds normal; no masses,  no organomegaly Extremities: extremities normal, atraumatic, no cyanosis or edema Pulses: 2+ and symmetric Skin: Skin color, texture, turgor normal. No rashes or lesions Neurologic: Alert and oriented X 3, normal strength and tone. Normal symmetric reflexes. Normal coordination and gait.      Assessment & Plan:   Problem List Items Addressed This Visit    Routine general medical examination at a health care facility    Annual comprehensive preventive exam was done as well as an evaluation and management of chronic conditions .  During the course of the visit the patient was educated and counseled about appropriate screening and preventive services including :  diabetes screening, lipid analysis with projected  10 year  risk for CAD , nutrition counseling, breast, cervical and colorectal cancer screening, and recommended immunizations.  Printed recommendations for health maintenance screenings was given      Impaired fasting glucose - Primary    She has been checking morning fasting sugars which have never been above 110 and post prandials in the 90's.  Lab Results   Component Value Date   HGBA1C 5.6 09/01/2017         Hypothyroidism    Thyroid function is WNL on current dose.  No current changes needed.   Lab Results  Component Value Date   TSH 3.37 09/01/2017         Hair loss    Thyroid, testosterone levels are normal      Relevant Orders   Testos,Total,Free and SHBG (Female) (Completed)    Other Visit Diagnoses    Breast cancer screening       Relevant Orders   MM 3D SCREEN BREAST BILATERAL   Postmenopausal estrogen deficiency       Relevant Orders   DG Bone Density   Need for vaccination with 13-polyvalent pneumococcal conjugate vaccine       Relevant Orders   Pneumococcal conjugate vaccine 13-valent IM (Completed)      I have discontinued Rod Holler B. Kennan's zolpidem and zolpidem. I am also having her maintain her fexofenadine-pseudoephedrine, fluticasone, prednisoLONE acetate, meloxicam, sertraline, traZODone, ALPRAZolam, fenofibrate, and levothyroxine.  No orders of the defined types were placed in this encounter.   Medications Discontinued During This Encounter  Medication Reason  . zolpidem (AMBIEN CR) 6.25 MG CR tablet Patient has not taken in last 30 days  . zolpidem (AMBIEN CR) 6.25 MG CR tablet Patient has not taken in last 30 days    Follow-up: Return in about 6 months (around 03/26/2018) for IPG, hypothyroid.   [name], MD

## 2017-09-24 NOTE — Patient Instructions (Addendum)
Lorraine Mills now makes 2  frozen breakfast items that are low carb and microwaveable in 2 minutes or less:  The  Frittata    The  "Egg'which"  . Lorraine Mills are similar to quiches without the crust)  Lorraine Mills also makes a microwaveable omelet called "Just Crack an Egg"  Which you can find in the egg section      Why follow it? Research shows. . Those who follow the Mediterranean diet have a reduced risk of heart disease  . The diet is associated with a reduced incidence of Parkinson's and Alzheimer's diseases . People following the diet may have longer life expectancies and lower rates of chronic diseases  . The Dietary Guidelines for Americans recommends the Mediterranean diet as an eating plan to promote health and prevent disease  What Is the Mediterranean Diet?  . Healthy eating plan based on typical foods and recipes of Mediterranean-style cooking . The diet is primarily a plant based diet; these foods should make up a majority of meals   Starches - Plant based foods should make up a majority of meals - They are an important sources of vitamins, minerals, energy, antioxidants, and fiber - Choose whole grains, foods high in fiber and minimally processed items  - Typical grain sources include wheat, oats, barley, corn, brown rice, bulgar, farro, millet, polenta, couscous  - Various types of beans include chickpeas, lentils, fava beans, black beans, white beans   Fruits  Veggies - Large quantities of antioxidant rich fruits & veggies; 6 or more servings  - Vegetables can be eaten raw or lightly drizzled with oil and cooked  - Vegetables common to the traditional Mediterranean Diet include: artichokes, arugula, beets, broccoli, brussel sprouts, cabbage, carrots, celery, collard greens, cucumbers, eggplant, kale, leeks, lemons, lettuce, mushrooms, okra, onions, peas, peppers, potatoes, pumpkin, radishes, rutabaga, shallots, spinach, sweet potatoes, turnips, zucchini - Fruits common to the  Mediterranean Diet include: apples, apricots, avocados, cherries, clementines, dates, figs, grapefruits, grapes, melons, nectarines, oranges, peaches, pears, pomegranates, strawberries, tangerines  Fats - Replace butter and margarine with healthy oils, such as olive oil, canola oil, and tahini  - Limit nuts to no more than a handful a day  - Nuts include walnuts, almonds, pecans, pistachios, pine nuts  - Limit or avoid candied, honey roasted or heavily salted nuts - Olives are central to the [employer] - can be eaten whole or used in a variety of dishes   Meats Protein - Limiting red meat: no more than a few times a month - When eating red meat: choose lean cuts and keep the portion to the size of deck of cards - Eggs: approx. 0 to 4 times a week  - Fish and lean poultry: at least 2 a week  - Healthy protein sources include, chicken, [place], lean beef, lamb - Increase intake of seafood such as tuna, salmon, trout, mackerel, shrimp, scallops - Avoid or limit high fat processed meats such as sausage and bacon  Dairy - Include moderate amounts of low fat dairy products  - Focus on healthy dairy such as fat free yogurt, skim milk, low or reduced fat cheese - Limit dairy products higher in fat such as whole or 2% milk, cheese, ice cream  Alcohol - Moderate amounts of red wine is ok  - No more than 5 oz daily for women (all ages) and men older than age 77  - No more than 10 oz of wine daily for men younger than 34  Other - Limit sweets and other desserts  - Use herbs and spices instead of salt to flavor foods  - Herbs and spices common to the traditional Mediterranean Diet include: basil, bay leaves, chives, cloves, cumin, fennel, garlic, lavender, marjoram, mint, oregano, parsley, pepper, rosemary, sage, savory, sumac, tarragon, thyme   It's not just a diet, it's a lifestyle:  . The Mediterranean diet includes lifestyle factors typical of those in the region  . Foods, drinks and meals are  best eaten with others and savored . Daily physical activity is important for overall good health . This could be strenuous exercise like running and aerobics . This could also be more leisurely activities such as walking, housework, yard-work, or taking the stairs . Moderation is the key; a balanced and healthy diet accommodates most foods and drinks . Consider portion sizes and frequency of consumption of certain foods   Meal Ideas & Options:  . Breakfast:  o Whole wheat toast or whole wheat English muffins with peanut butter & hard boiled egg o Steel cut oats topped with apples & cinnamon and skim milk  o Fresh fruit: banana, strawberries, melon, berries, peaches  o Smoothies: strawberries, bananas, greek yogurt, peanut butter o Low fat greek yogurt with blueberries and granola  o Egg white omelet with spinach and mushrooms o Breakfast couscous: whole wheat couscous, apricots, skim milk, cranberries  . Sandwiches:  o Hummus and grilled vegetables (peppers, zucchini, squash) on whole wheat bread   o Grilled chicken on whole wheat pita with lettuce, tomatoes, cucumbers or tzatziki  o Tuna salad on whole wheat bread: tuna salad made with greek yogurt, olives, red peppers, capers, green onions o Garlic rosemary lamb pita: lamb sauted with garlic, rosemary, salt & pepper; add lettuce, cucumber, greek yogurt to pita - flavor with lemon juice and black pepper  . Seafood:  o Mediterranean grilled salmon, seasoned with garlic, basil, parsley, lemon juice and black pepper o Shrimp, lemon, and spinach whole-grain pasta salad made with low fat greek yogurt  o Seared scallops with lemon orzo  o Seared tuna steaks seasoned salt, pepper, coriander topped with tomato mixture of olives, tomatoes, olive oil, minced garlic, parsley, green onions and cappers  . Meats:  o Herbed greek chicken salad with kalamata olives, cucumber, feta  o Red bell peppers stuffed with spinach, bulgur, lean ground beef (or  lentils) & topped with feta   o Kebabs: skewers of chicken, tomatoes, onions, zucchini, squash  o [place] burgers: made with red onions, mint, dill, lemon juice, feta cheese topped with roasted red peppers . Vegetarian o Cucumber salad: cucumbers, artichoke hearts, celery, red onion, feta cheese, tossed in olive oil & lemon juice  o Hummus and whole grain pita points with a greek salad (lettuce, tomato, feta, olives, cucumbers, red onion) o Lentil soup with celery, carrots made with vegetable broth, garlic, salt and pepper  o Tabouli salad: parsley, bulgur, mint, scallions, cucumbers, tomato, radishes, lemon juice, olive oil, salt and pepper.

## 2017-09-24 NOTE — Assessment & Plan Note (Addendum)
She has been checking morning fasting sugars which have never been above 110 and post prandials in the 90's.  Lab Results  Component Value Date   HGBA1C 5.6 09/01/2017

## 2017-09-26 NOTE — Assessment & Plan Note (Signed)
Thyroid function is WNL on current dose.  No current changes needed.   Lab Results  Component Value Date   TSH 3.37 09/01/2017

## 2017-09-26 NOTE — Assessment & Plan Note (Signed)
Annual comprehensive preventive exam was done as well as an evaluation and management of chronic conditions .  During the course of the visit the patient was educated and counseled about appropriate screening and preventive services including :  diabetes screening, lipid analysis with projected  10 year  risk for CAD , nutrition counseling, breast, cervical and colorectal cancer screening, and recommended immunizations.  Printed recommendations for health maintenance screenings was given 

## 2017-09-28 LAB — TESTOS,TOTAL,FREE AND SHBG (FEMALE)
FREE TESTOSTERONE: 1.6 pg/mL (ref 0.1–6.4)
SEX HORMONE BINDING: 37 nmol/L (ref 14–73)
Testosterone, Total, LC-MS-MS: 13 ng/dL (ref 2–45)

## 2017-09-30 ENCOUNTER — Encounter: Payer: Self-pay | Admitting: Internal Medicine

## 2017-09-30 DIAGNOSIS — L659 Nonscarring hair loss, unspecified: Secondary | ICD-10-CM | POA: Insufficient documentation

## 2017-09-30 NOTE — Assessment & Plan Note (Signed)
Thyroid, testosterone levels are normal

## 2017-10-02 ENCOUNTER — Telehealth: Payer: Self-pay | Admitting: *Deleted

## 2017-10-02 ENCOUNTER — Telehealth: Payer: Self-pay | Admitting: Internal Medicine

## 2017-10-02 NOTE — Telephone Encounter (Signed)
Copied from [place] [phone]. Topic: Quick Communication - Office Called Patient >> Oct 02, 2017  2:44 PM Nanci Pina, LPN wrote: Reason for CRM: office called patient concerning my chart message requesting refill on alprazolam on 10/01/17, patient PCP out of office need to know patient has enough medication until Monday when PCP will return? [facility] nurse may enquire.

## 2017-10-02 NOTE — Telephone Encounter (Signed)
error 

## 2017-10-02 NOTE — Telephone Encounter (Signed)
Called patient left message to call office , PCP out of office, need to know if she has alprazolam to last til PCP is back in office.

## 2017-10-03 NOTE — Telephone Encounter (Signed)
Patient never returned call to [facility] on 10/02/17.

## 2017-10-07 ENCOUNTER — Encounter: Payer: Self-pay | Admitting: Internal Medicine

## 2017-10-08 MED ORDER — ALPRAZOLAM 0.5 MG PO TABS: 0.5000 mg | ORAL_TABLET | Freq: Every evening | ORAL | 5 refills | Status: DC | PRN

## 2017-10-08 NOTE — Telephone Encounter (Signed)
Printed, signed and faxed.  

## 2017-10-08 NOTE — Telephone Encounter (Signed)
Refilled: 09/23/2016 Last OV: 09/24/2017 Next OV: 03/26/2018

## 2017-10-08 NOTE — Telephone Encounter (Signed)
Medication filled 10/08/17

## 2017-10-22 DIAGNOSIS — F411 Generalized anxiety disorder: Secondary | ICD-10-CM | POA: Diagnosis not present

## 2017-10-22 DIAGNOSIS — F331 Major depressive disorder, recurrent, moderate: Secondary | ICD-10-CM | POA: Diagnosis not present

## 2017-10-22 DIAGNOSIS — F429 Obsessive-compulsive disorder, unspecified: Secondary | ICD-10-CM | POA: Diagnosis not present

## 2017-11-12 ENCOUNTER — Telehealth: Payer: Self-pay | Admitting: Internal Medicine

## 2017-11-12 NOTE — Telephone Encounter (Signed)
Copied from [place] [id number]. Topic: Quick Communication - See Telephone Encounter >> Nov 12, 2017  3:25 PM Neva Seat wrote: Bone Density and Mammogram was ordered for pt.  Pt hasn't had a call back to be scheduled for these orders. Please call pt back to let her know the status and or what needs to be done next.

## 2017-11-12 NOTE — Telephone Encounter (Signed)
Appointment scheduled for mammogram 12/01/17 @ 1:40pm and DEXA 12/24/17 @ 2:20pm @ [place] . Patient advised of appointments

## 2017-11-12 NOTE — Telephone Encounter (Signed)
Please advise 

## 2017-12-01 ENCOUNTER — Ambulatory Visit
Admission: RE | Admit: 2017-12-01 | Discharge: 2017-12-01 | Disposition: A | Discharge status: Home / Self Care | Payer: BLUE CROSS/BLUE SHIELD | Source: Ambulatory Visit | Attending: Internal Medicine | Admitting: Internal Medicine

## 2017-12-01 DIAGNOSIS — Z1231 Encounter for screening mammogram for malignant neoplasm of breast: Secondary | ICD-10-CM | POA: Diagnosis not present

## 2017-12-01 DIAGNOSIS — Z1239 Encounter for other screening for malignant neoplasm of breast: Secondary | ICD-10-CM

## 2017-12-14 ENCOUNTER — Ambulatory Visit
Admission: RE | Admit: 2017-12-14 | Discharge: 2017-12-14 | Disposition: A | Discharge status: Home / Self Care | Payer: BLUE CROSS/BLUE SHIELD | Source: Ambulatory Visit | Attending: Internal Medicine | Admitting: Internal Medicine

## 2017-12-14 DIAGNOSIS — Z780 Asymptomatic menopausal state: Secondary | ICD-10-CM

## 2017-12-14 DIAGNOSIS — M85852 Other specified disorders of bone density and structure, left thigh: Secondary | ICD-10-CM | POA: Diagnosis not present

## 2018-01-06 DIAGNOSIS — H18452 Nodular corneal degeneration, left eye: Secondary | ICD-10-CM | POA: Diagnosis not present

## 2018-01-06 DIAGNOSIS — Z947 Corneal transplant status: Secondary | ICD-10-CM | POA: Diagnosis not present

## 2018-01-06 DIAGNOSIS — H2512 Age-related nuclear cataract, left eye: Secondary | ICD-10-CM | POA: Diagnosis not present

## 2018-01-06 DIAGNOSIS — H40013 Open angle with borderline findings, low risk, bilateral: Secondary | ICD-10-CM | POA: Diagnosis not present

## 2018-02-05 ENCOUNTER — Ambulatory Visit (INDEPENDENT_AMBULATORY_CARE_PROVIDER_SITE_OTHER): Payer: BLUE CROSS/BLUE SHIELD

## 2018-02-05 VITALS — BP 118/80 | HR 63 | Temp 98.3°F | Resp 15 | Ht 61.0 in | Wt 165.4 lb

## 2018-02-05 DIAGNOSIS — Z0000 Encounter for general adult medical examination without abnormal findings: Secondary | ICD-10-CM | POA: Diagnosis not present

## 2018-02-05 NOTE — Progress Notes (Addendum)
Subjective:   Lorraine Mills is a 67 y.o. female who presents for an Initial Medicare Annual Wellness Visit.  Review of Systems    No ROS.  Medicare Wellness Visit. Additional risk factors are reflected in the social history.  Cardiac Risk Factors include: advanced age (>60men, >72 women);obesity (BMI >30kg/m2)     Objective:    Today's Vitals   02/05/18 1014  BP: 118/80  Pulse: 63  Resp: 15  Temp: 98.3 F (36.8 C)  TempSrc: Oral  SpO2: 95%  Weight: 165 lb 6.4 oz (75 kg)  Height: 5\' 1"  (1.549 m)   Body mass index is 31.25 kg/m.  Advanced Directives 02/05/2018 03/25/2016 03/19/2016 03/25/2015  Does Patient Have a Medical Advance Directive? No No No No  Does patient want to make changes to medical advance directive? Yes (MAU/Ambulatory/Procedural Areas - Information given) - - -  Would patient like information on creating a medical advance directive? - Yes - Educational materials given - No - patient declined information    Current Medications (verified) Outpatient Encounter Medications as of 02/05/2018  Medication Sig  . ALPRAZolam (XANAX) 0.5 MG tablet Take 1 tablet (0.5 mg total) by mouth at bedtime as needed for anxiety.  . fenofibrate (TRICOR) 48 MG tablet TAKE ONE TABLET BY MOUTH EVERY DAY  . fexofenadine-pseudoephedrine (ALLEGRA-D 24) 180-240 MG per 24 hr tablet Take 1 tablet by mouth daily.  . fluticasone (FLONASE) 50 MCG/ACT nasal spray Place 2 sprays into the nose daily.  Marland Kitchen levothyroxine (SYNTHROID, LEVOTHROID) 75 MCG tablet TAKE ONE TABLET BY MOUTH EVERY DAY  . meloxicam (MOBIC) 15 MG tablet Take 1 tablet (15 mg total) by mouth daily.  . prednisoLONE acetate (PRED FORTE) 1 % ophthalmic suspension Place 1 drop into the right eye 4 (four) times daily.  . sertraline (ZOLOFT) 50 MG tablet   . traZODone (DESYREL) 50 MG tablet Take 0.5-1 tablets (25-50 mg total) by mouth at bedtime as needed for sleep.   No facility-administered encounter medications on file as of  02/05/2018.     Allergies (verified) Latex and Levofloxacin   History: Past Medical History:  Diagnosis Date  . Anxiety   . Goiter   . Hyperlipidemia    TGs  . Seasonal allergies    Past Surgical History:  Procedure Laterality Date  . ABDOMINAL HYSTERECTOMY     supracervical   . BILATERAL OOPHORECTOMY  2001   BENIGN TUMOR  . BREAST BIOPSY Left 1985   cyst   . CATARACT EXTRACTION Right   . COLONOSCOPY  08/17/2009  . COLONOSCOPY WITH PROPOFOL N/A 03/19/2016   Procedure: COLONOSCOPY WITH PROPOFOL;  Surgeon: Robert Bellow, MD;  Location: [facility] ENDOSCOPY;  Service: Endoscopy;  Laterality: N/A;  . CORNEAL TRANSPLANT Right    Family History  Problem Relation Age of Onset  . Cancer Mother        OVARIAN  . Stroke Father   . Breast cancer Sister 17  . Thyroid disease Other        MULTIPLE FAMILY MEMBERS  . Atrial fibrillation Brother    Social History   Socioeconomic History  . Marital status: Married    Spouse name: Not on file  . Number of children: Not on file  . Years of education: Not on file  . Highest education level: Not on file  Occupational History  . Not on file  Social Needs  . Financial resource strain: Not on file  . Food insecurity:    Worry: Not on  file    Inability: Not on file  . Transportation needs:    Medical: No    Non-medical: No  Tobacco Use  . Smoking status: Former Smoker    Years: 24.00    Types: Cigarettes    Last attempt to quit: 05/09/1998    Years since quitting: 19.7  . Smokeless tobacco: Never Used  Substance and Sexual Activity  . Alcohol use: Yes    Alcohol/week: 0.0 standard drinks    Comment: once a week  . Drug use: No  . Sexual activity: Not on file  Lifestyle  . Physical activity:    Days per week: 2 days    Minutes per session: 30 min  . Stress: Not at all  Relationships  . Social connections:    Talks on phone: Not on file    Gets together: Not on file    Attends religious service: Not on file    Active  member of club or organization: Not on file    Attends meetings of clubs or organizations: Not on file    Relationship status: Married  Other Topics Concern  . Not on file  Social History Narrative  . Not on file    Tobacco Counseling Counseling given: Not Answered   Clinical Intake:  Pre-visit preparation completed: Yes  Pain : No/denies pain     Nutritional Status: BMI > 30  Obese Diabetes: No  How often do you need to have someone help you when you read instructions, pamphlets, or other written materials from your doctor or pharmacy?: 1 - Never  Interpreter Needed?: No      Activities of Daily Living In your present state of health, do you have any difficulty performing the following activities: 02/05/2018  Hearing? N  Vision? N  Difficulty concentrating or making decisions? Y  Comment States she has difficulty with long term and short term memory; feels confused at times. Declines folow up offered today.   Walking or climbing stairs? N  Dressing or bathing? N  Doing errands, shopping? N  Preparing Food and eating ? N  Using the Toilet? N  In the past six months, have you accidently leaked urine? N  Do you have problems with loss of bowel control? N  Managing your Medications? N  Managing your Finances? N  Housekeeping or managing your Housekeeping? N  Some recent data might be hidden     Immunizations and Health Maintenance Immunization History  Administered Date(s) Administered  . Influenza, High Dose Seasonal PF 03/27/2015  . Pneumococcal Conjugate-13 09/24/2017  . Tdap 10/07/2012   Health Maintenance Due  Topic Date Due  . FOOT EXAM  04/17/1961  . INFLUENZA VACCINE  01/07/2018    Patient Care Team: [name], MD as PCP - General (Internal Medicine) [name], MD (Internal Medicine) Bary Castilla Forest Gleason, MD (General Surgery)  Indicate any recent Medical Services you may have received from other than [facility] providers in the past year  (date may be approximate).     Assessment:   This is a routine wellness examination for Lorraine Mills.   The goal of the wellness visit is to assist the patient how to close the gaps in care and create a preventative care plan for the patient.   The roster of all physicians providing medical care to patient is listed in the Snapshot section of the chart.  Osteoporosis risk reviewed.    Safety issues reviewed; Smoke and carbon monoxide detectors in the home. No firearms  in the home. Wears seatbelts when driving or riding with others. No violence in the home.  They do not have excessive sun exposure.  Discussed the need for sun protection: hats, long sleeves and the use of sunscreen if there is significant sun exposure.  Patient is alert, normal appearance, oriented to person/place/and time.  Correctly identified the president of the [place] and recalls of 3/3 words. Performs simple calculations and can read correct time from watch face.  Displays appropriate judgement.  No new identified risk were noted.  No failures at ADL's or IADL's.    BMI- discussed the importance of a healthy diet, water intake and the benefits of aerobic exercise. Educational material provided.   24 hour diet recall: Regular diet  Dental- every 6 months.  Mike Craze.  Influenza vaccine discussed. She plans to receive later in the season.   Eye- Visual acuity not assessed per patient preference since they have regular follow up with the ophthalmologist.  Wears corrective lenses.  Sleep patterns- Sleeps well through the night. Taking medication as directed.   Hearing/Vision screen Hearing Screening Comments: Patient is able to hear conversational tones without difficulty.  No issues reported.   Vision Screening Comments: Followed by Annia Belt  Cornea transplant, R eye Cataract extract, R eye Visits every 6 months  Wears corrective lenses Visual acuity not assessed per patient preference since they have  regular follow up with the ophthalmologist  Dietary issues and exercise activities discussed: Current Exercise Habits: Home exercise routine, Type of exercise: walking, Time (Minutes): 30, Frequency (Times/Week): 2, Weekly Exercise (Minutes/Week): 60, Intensity: Mild  Goals    . Lose weight     Exercise 150 minutes per week (5 days, 30 minutes) Stay hydrated Low carb diet      Depression Screen PHQ 2/9 Scores 02/05/2018 01/29/2016  PHQ - 2 Score 0 3  PHQ- 9 Score - 16    Fall Risk Fall Risk  02/05/2018 09/24/2017 09/24/2017  Falls in the past year? No No No   Cognitive Function: MMSE - Mini Mental State Exam 02/05/2018  Orientation to time 5  Orientation to Place 5  Registration 3  Attention/ Calculation 5  Recall 3  Language- name 2 objects 2  Language- repeat 1  Language- follow 3 step command 3  Language- read & follow direction 1  Write a sentence 1  Copy design 1  Total score 30        Screening Tests Health Maintenance  Topic Date Due  . FOOT EXAM  04[birth year]17/1961  . INFLUENZA VACCINE  01/07/2018  . HEMOGLOBIN A1C  03/04/2018  . OPHTHALMOLOGY EXAM  09/23/2018  . URINE MICROALBUMIN  09/25/2018  . PNA vac Low Risk Adult (2 of 2 - PPSV23) 09/25/2018  . MAMMOGRAM  12/02/2018  . TETANUS/TDAP  10/08/2022  . COLONOSCOPY  03/19/2026  . DEXA SCAN  Completed  . Hepatitis C Screening  Completed     Plan:    End of life planning; Advance aging; Advanced directives discussed. Copy of current HCPOA/Living Will requested upon completion.    I have personally reviewed and noted the following in the patient's chart:   . Medical and social history . Use of alcohol, tobacco or illicit drugs  . Current medications and supplements . Functional ability and status . Nutritional status . Physical activity . Advanced directives . List of other physicians . Hospitalizations, surgeries, and ER visits in previous 12 months . Vitals . Screenings to include cognitive, depression,  and falls .  Referrals and appointments  In addition, I have reviewed and discussed with patient certain preventive protocols, quality metrics, and best practice recommendations. A written personalized care plan for preventive services as well as general preventive health recommendations were provided to patient.     OBrien-Blaney, Denisa L, LPN   12/28/5870      I have reviewed the above information and agree with above.   Deborra Medina, MD

## 2018-02-05 NOTE — Patient Instructions (Addendum)
  Lorraine Mills , Thank you for taking time to come for your Medicare Wellness Visit. I appreciate your ongoing commitment to your health goals. Please review the following plan we discussed and let me know if I can assist you in the future.   Follow up as needed.    Bring a copy of your [place] and/or Living Will to be scanned into chart upon completion.   Have a great day!  These are the goals we discussed: Goals    . Lose weight     Exercise 150 minutes per week (5 days, 30 minutes) Stay hydrated Low carb diet       This is a list of the screening recommended for you and due dates:  Health Maintenance  Topic Date Due  . Complete foot exam   04/17/1961  . Flu Shot  01/07/2018  . Hemoglobin A1C  03/04/2018  . Eye exam for diabetics  09/23/2018  . Urine Protein Check  09/25/2018  . Pneumonia vaccines (2 of 2 - PPSV23) 09/25/2018  . Mammogram  12/02/2018  . Tetanus Vaccine  10/08/2022  . Colon Cancer Screening  03/19/2026  . DEXA scan (bone density measurement)  Completed  .  Hepatitis C: One time screening is recommended by Center for Disease Control  (CDC) for  adults born from [birth year] through 1965.   Completed

## 2018-02-22 ENCOUNTER — Telehealth: Payer: Self-pay | Admitting: Internal Medicine

## 2018-02-22 ENCOUNTER — Other Ambulatory Visit: Payer: Self-pay | Admitting: *Deleted

## 2018-02-22 DIAGNOSIS — F429 Obsessive-compulsive disorder, unspecified: Secondary | ICD-10-CM | POA: Diagnosis not present

## 2018-02-22 DIAGNOSIS — F411 Generalized anxiety disorder: Secondary | ICD-10-CM | POA: Diagnosis not present

## 2018-02-22 DIAGNOSIS — F331 Major depressive disorder, recurrent, moderate: Secondary | ICD-10-CM | POA: Diagnosis not present

## 2018-02-22 MED ORDER — LEVOTHYROXINE SODIUM 75 MCG PO TABS: 75.0000 ug | ORAL_TABLET | Freq: Every day | ORAL | 0 refills | Status: DC

## 2018-02-22 MED ORDER — FENOFIBRATE 48 MG PO TABS: 48.0000 mg | ORAL_TABLET | Freq: Every day | ORAL | 0 refills | Status: DC

## 2018-02-22 NOTE — Telephone Encounter (Signed)
Copied from [place] [id number]. Topic: Quick Communication - Rx Refill/Question >> Feb 22, 2018  2:48 PM Mylinda Latina, NT wrote: Medication: fenofibrate (TRICOR) 48 MG tablet, levothyroxine (SYNTHROID, LEVOTHROID) 75 MCG tablet   Has the patient contacted their pharmacy? Yes.  Changed pharmacy was told to call office  (Agent: If no, request that the patient contact the pharmacy for the refill.) (Agent: If yes, when and what did the pharmacy advise?)  Preferred Pharmacy (with phone number or street name): [place], [place] - [place] [phone] (Phone) [phone] (Fax)    Agent: Please be advised that RX refills may take up to 3 business days. We ask that you follow-up with your pharmacy.

## 2018-02-22 NOTE — Telephone Encounter (Signed)
Rx refilled at new pharmacy per protocol- LOV: 09/24/17 with next visit 03/26/18

## 2018-03-12 DIAGNOSIS — L255 Unspecified contact dermatitis due to plants, except food: Secondary | ICD-10-CM | POA: Diagnosis not present

## 2018-03-12 DIAGNOSIS — L304 Erythema intertrigo: Secondary | ICD-10-CM | POA: Diagnosis not present

## 2018-03-12 DIAGNOSIS — R21 Rash and other nonspecific skin eruption: Secondary | ICD-10-CM | POA: Diagnosis not present

## 2018-03-26 ENCOUNTER — Other Ambulatory Visit: Payer: Self-pay | Admitting: Internal Medicine

## 2018-03-26 ENCOUNTER — Encounter: Payer: Self-pay | Admitting: Internal Medicine

## 2018-03-26 ENCOUNTER — Ambulatory Visit (INDEPENDENT_AMBULATORY_CARE_PROVIDER_SITE_OTHER): Payer: BLUE CROSS/BLUE SHIELD | Admitting: Internal Medicine

## 2018-03-26 ENCOUNTER — Other Ambulatory Visit: Payer: Self-pay

## 2018-03-26 VITALS — BP 142/86 | HR 77 | Temp 97.9°F | Resp 15 | Ht 61.0 in | Wt 168.0 lb

## 2018-03-26 DIAGNOSIS — E6609 Other obesity due to excess calories: Secondary | ICD-10-CM | POA: Diagnosis not present

## 2018-03-26 DIAGNOSIS — L247 Irritant contact dermatitis due to plants, except food: Secondary | ICD-10-CM | POA: Diagnosis not present

## 2018-03-26 DIAGNOSIS — E039 Hypothyroidism, unspecified: Secondary | ICD-10-CM | POA: Diagnosis not present

## 2018-03-26 DIAGNOSIS — Z6831 Body mass index (BMI) 31.0-31.9, adult: Secondary | ICD-10-CM | POA: Diagnosis not present

## 2018-03-26 DIAGNOSIS — R030 Elevated blood-pressure reading, without diagnosis of hypertension: Secondary | ICD-10-CM

## 2018-03-26 DIAGNOSIS — F324 Major depressive disorder, single episode, in partial remission: Secondary | ICD-10-CM

## 2018-03-26 DIAGNOSIS — R7301 Impaired fasting glucose: Secondary | ICD-10-CM

## 2018-03-26 DIAGNOSIS — E669 Obesity, unspecified: Secondary | ICD-10-CM | POA: Diagnosis not present

## 2018-03-26 LAB — COMPREHENSIVE METABOLIC PANEL
ALBUMIN: 4.4 g/dL (ref 3.5–5.2)
ALT: 15 U/L (ref 0–35)
AST: 15 U/L (ref 0–37)
Alkaline Phosphatase: 35 U/L — ABNORMAL LOW (ref 39–117)
BILIRUBIN TOTAL: 0.4 mg/dL (ref 0.2–1.2)
BUN: 28 mg/dL — ABNORMAL HIGH (ref 6–23)
CALCIUM: 9.5 mg/dL (ref 8.4–10.5)
CO2: 28 meq/L (ref 19–32)
CREATININE: 0.89 mg/dL (ref 0.40–1.20)
Chloride: 106 mEq/L (ref 96–112)
GFR: 67.25 mL/min (ref >60.00)
Glucose, Bld: 100 mg/dL — ABNORMAL HIGH (ref 70–99)
Potassium: 4.3 mEq/L (ref 3.5–5.1)
Sodium: 142 mEq/L (ref 135–145)
Total Protein: 6.9 g/dL (ref 6.0–8.3)

## 2018-03-26 LAB — LIPID PANEL
CHOL/HDL RATIO: 4
Cholesterol: 201 mg/dL — ABNORMAL HIGH (ref 0–200)
HDL: 52.6 mg/dL (ref >39.00)
LDL Cholesterol: 124 mg/dL — ABNORMAL HIGH (ref 0–99)
NONHDL: 148.19
TRIGLYCERIDES: 122 mg/dL (ref 0.0–149.0)
VLDL: 24.4 mg/dL (ref 0.0–40.0)

## 2018-03-26 LAB — TSH: TSH: 2.8 u[IU]/mL (ref 0.35–4.50)

## 2018-03-26 LAB — HEMOGLOBIN A1C: HEMOGLOBIN A1C: 5.5 % (ref 4.6–6.5)

## 2018-03-26 LAB — MICROALBUMIN / CREATININE URINE RATIO
CREATININE, U: 101.6 mg/dL
MICROALB/CREAT RATIO: 0.7 mg/g (ref 0.0–30.0)

## 2018-03-26 MED ORDER — ALPRAZOLAM 0.5 MG PO TABS: 0.5000 mg | ORAL_TABLET | Freq: Every evening | ORAL | 5 refills | Status: DC | PRN

## 2018-03-26 MED ORDER — PREDNISONE 10 MG PO TABS: ORAL_TABLET | ORAL | 0 refills | Status: DC

## 2018-03-26 NOTE — Progress Notes (Signed)
Subjective:  Patient ID: Lorraine Mills, female    DOB: 07-24-1950  Age: 67 y.o. MRN: [id number]  CC: The primary encounter diagnosis was Impaired fasting blood sugar. 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  HPI Lorraine Mills presents for 6 month follow up on impaired fasting glucose, hypothyroidism, obesity and depression.   Patient has no complaints today.  Patient is following a low glycemic index diet about and taking all prescribed medications regularly without side effects. . Patient is not exercising or  intentionally trying to lose weight .     Results of DEXA done in July , discussed: she has mild  osteopneia mammo done in June   Received IM injection of predniosne 2 weeks ago for contact dermatitis from [place].  Still itching all over.  Using benadryl   Outpatient Medications Prior to Visit  Medication Sig Dispense Refill  . clotrimazole-betamethasone (LOTRISONE) cream   0  . fenofibrate (TRICOR) 48 MG tablet Take 1 tablet (48 mg total) by mouth daily. 90 tablet 0  . fexofenadine-pseudoephedrine (ALLEGRA-D 24) 180-240 MG per 24 hr tablet Take 1 tablet by mouth daily.    . fluticasone (FLONASE) 50 MCG/ACT nasal spray Place 2 sprays into the nose daily.    Marland Kitchen levothyroxine (SYNTHROID, LEVOTHROID) 75 MCG tablet Take 1 tablet (75 mcg total) by mouth daily. 90 tablet 0  . meloxicam (MOBIC) 15 MG tablet Take 1 tablet (15 mg total) by mouth daily. 30 tablet 4  . prednisoLONE acetate (PRED FORTE) 1 % ophthalmic suspension Place 1 drop into the right eye 4 (four) times daily.    . traZODone (DESYREL) 50  MG tablet Take 0.5-1 tablets (25-50 mg total) by mouth at bedtime as needed for sleep. 30 tablet 3  . ALPRAZolam (XANAX) 0.5 MG tablet Take 1 tablet (0.5 mg total) by mouth at bedtime as needed for anxiety. 30 tablet 5  . sertraline (ZOLOFT) 50 MG tablet   0   No facility-administered medications prior to visit.     Review of Systems;  Patient denies headache, fevers, malaise, unintentional weight loss, skin rash, eye pain, sinus congestion and sinus pain, sore throat, dysphagia,  hemoptysis , cough, dyspnea, wheezing, chest pain, palpitations, orthopnea, edema, abdominal pain, nausea, melena, diarrhea, constipation, flank pain, dysuria, hematuria, urinary  Frequency, nocturia, numbness, tingling, seizures,  Focal weakness, Loss of consciousness,  Tremor, insomnia, depression, anxiety, and suicidal ideation.      Objective:  BP (!) 142/86 (BP Location: Left Arm, Patient Position: Sitting, Cuff Size: Normal)   Pulse 77   Temp 97.9 F (36.6 C) (Oral)   Resp 15   Ht 5\' 1"  (1.549 m)   Wt 168 lb (76.2 kg)   SpO2 98%   BMI 31.74 kg/m   BP Readings from Last 3 Encounters:  03/26/18 (!) 142/86  02/05/18 118/80  09/24/17 120/76    Wt Readings from Last 3 Encounters:  03/26/18 168 lb (76.2 kg)  02/05/18 165 lb 6.4 oz (75 kg)  09/24/17 165 lb (74.8 kg)    General appearance: alert, cooperative and appears stated age Ears: normal TM's and external ear canals both ears Throat: lips, mucosa, and  tongue normal; teeth and gums normal Neck: no adenopathy, no carotid bruit, supple, symmetrical, trachea midline and thyroid not enlarged, symmetric, no tenderness/mass/nodules Back: symmetric, no curvature. ROM normal. No CVA tenderness. Lungs: clear to auscultation bilaterally Heart: regular rate and rhythm, S1, S2 normal, no murmur, click, rub or gallop Abdomen: soft, non-tender; bowel sounds normal; no masses,  no organomegaly Pulses: 2+ and symmetric Skin: Skin color, texture, turgor  normal. No rashes or lesions Lymph nodes: Cervical, supraclavicular, and axillary nodes normal.  Lab Results  Component Value Date   HGBA1C 5.5 03/26/2018   HGBA1C 5.6 09/01/2017    Lab Results  Component Value Date   CREATININE 0.89 03/26/2018   CREATININE 0.87 09/01/2017   CREATININE 0.94 09/23/2016    Lab Results  Component Value Date   WBC 4.6 09/01/2017   HGB 14.4 09/01/2017   HCT 41.4 09/01/2017   PLT 217.0 09/01/2017   GLUCOSE 100 (H) 03/26/2018   CHOL 201 (H) 03/26/2018   TRIG 122.0 03/26/2018   HDL 52.60 03/26/2018   LDLDIRECT 117.0 09/01/2017   LDLCALC 124 (H) 03/26/2018   ALT 15 03/26/2018   AST 15 03/26/2018   NA 142 03/26/2018   K 4.3 03/26/2018   CL 106 03/26/2018   CREATININE 0.89 03/26/2018   BUN 28 (H) 03/26/2018   CO2 28 03/26/2018   TSH 2.80 03/26/2018   HGBA1C 5.5 03/26/2018   MICROALBUR <0.7 03/26/2018    Dg Bone Density  Result Date: 12/14/2017 EXAM: DUAL X-RAY ABSORPTIOMETRY (DXA) FOR BONE MINERAL DENSITY IMPRESSION: Dear Dr. Derrel Mills, Your patient Lorraine Mills completed a BMD test on 12/14/2017 using the [place] (analysis version: 14.10) manufactured by [employer]. The following summarizes the results of our evaluation. PATIENT BIOGRAPHICAL: Name: Lorraine Mills, Lorraine Mills Patient ID: [id number] Birth Date: 1950/07/25 Height: 60.0 in. Gender: Female Exam Date: 12/14/2017 Weight: 165.0 lbs. Indications: Height Loss, Hypothyroid, Hysterectomy, Oophorectomy Bilateral, Postmenopausal, Family Hist. (Parent hip fracture) Fractures: Treatments: Synthroid ASSESSMENT: The BMD measured at Femur Neck Left is 0.874 g/cm2 with a T-score of -1.2. This patient is considered OSTEOPENIC according to [place]) criteria. L3 was excluded due to degenerative changes. Site Region Measured Measured WHO Young Adult BMD Date       Age      Classification T-score AP Spine L1-L4 (L3) 12/14/2017 66.6 Normal 0.8 1.283 g/cm2 DualFemur Neck Left 12/14/2017 66.6  Osteopenia -1.2 0.874 g/cm2 World Health Organization [facility]) criteria for post-menopausal, Caucasian Women: Normal:       T-score at or above -1 SD Osteopenia:   T-score between -1 and -2.5 SD Osteoporosis: T-score at or below -2.5 SD RECOMMENDATIONS: 1. All patients should optimize calcium and vitamin D intake. 2. Consider FDA-approved medical therapies in postmenopausal women and men aged 23 years and older, based on the following: a. A hip or vertebral(clinical or morphometric) fracture b. T-score < -2.5 at the femoral neck or spine after appropriate evaluation to exclude secondary causes c. Low bone mass (T-score between -1.0 and -2.5 at the femoral neck or spine) and a 10-year probability of a hip fracture > 3% or a 10-year probability of a major osteoporosis-related fracture > 20% based on the [place]-adapted WHO algorithm d. Clinician judgment and/or patient preferences may indicate treatment for people with 10-year fracture probabilities above or below these levels FOLLOW-UP: People with diagnosed cases of osteoporosis or at high risk for fracture should have regular bone mineral density tests. For patients eligible for Medicare, routine testing is allowed once  every 2 years. The testing frequency can be increased to one year for patients who have rapidly progressing disease, those who are receiving or discontinuing medical therapy to restore bone mass, or have additional risk factors. I have reviewed this report, and agree with the above findings. Mark A. Boles,M.D. [facility] Dear Dr. Derrel Mills, Your patient Lorraine Mills completed a FRAX assessment on 12/14/2017 using the [place] (analysis version: 14.10) manufactured by [employer]. The following summarizes the results of our evaluation. PATIENT BIOGRAPHICAL: Name: Ashyla, Luth Patient ID: [id number] Birth Date: March 31, 1951 Height:    60.0 in. Gender:     Female    [age]       Weight:    165.0 lbs. Ethnicity:  White                             Exam Date: 12/14/2017 FRAX* RESULTS:  (version: 3.5) 10-year Probability of Fracture1 Major Osteoporotic Fracture2 Hip Fracture 14.9% 1.0% Population: [place] (Caucasian) Risk Factors: Family Hist. (Parent hip fracture) Based on Femur (Left) Neck BMD 1 -The 10-year probability of fracture may be lower than reported if the patient has received treatment. 2 -Major Osteoporotic Fracture: Clinical Spine, Forearm, Hip or Shoulder *FRAX is a Materials engineer of the [employer] for Metabolic Bone Disease, a [place] (WHO) [facility]. ASSESSMENT: The probability of a major osteoporotic fracture is 14.9% within the next ten years. The probability of a hip fracture is 1.0% within the next ten years. I have reviewed this report and agree with the above findings. Mark A. Thornton Papas, M.D. [facility] Electronically Signed   By: Lavonia Dana M.D.   On: 12/14/2017 15:05    Assessment & Plan:   Problem List Items Addressed This Visit    Contact dermatitis and eczema due to plant    S/p IM injection of steroid recently,  Persistence of itching.  No cellulitis.  Steroid taper prescribed , 2nd generation antihistamine advised for itching       Depression    Now managed with zoloft and alprazolam and trazodone.   The risks and benefits of benzodiazepine use were reviewed with patient today including excessive sedation leading to respiratory depression,  impaired thinking/driving, and addiction.  No changes today       Hypothyroidism    Thyroid function is WNL on current dose.  No current changes needed.   Lab Results  Component Value Date   TSH 2.80 03/26/2018         Relevant Orders   TSH (Completed)   Impaired fasting glucose    She has been checking morning fasting sugars which have never been above 110 and post prandials in the 90's. a1c is below the threshhold for prediabetes  Lab Results  Component Value Date   HGBA1C 5.5 03/26/2018          Obesity (BMI 30.0-34.9)    I have addressed  BMI and recommended a low glycemic index diet.  I have also recommended that patient start exercising with a goal of 30 minutes of aerobic exercise a minimum of 5 days per week.          Other Visit Diagnoses    Impaired fasting blood sugar    -  Primary   Relevant Orders   Hemoglobin A1c (Completed)   Lipid panel (Completed)   Elevated blood-pressure reading  without diagnosis of hypertension       Relevant Orders   Comprehensive metabolic panel (Completed)   Microalbumin / creatinine urine ratio (Completed)   Class 1 obesity due to excess calories without serious comorbidity with body mass index (BMI) of 31.0 to 31.9 in adult        A total of 25 minutes of face to face time was spent with patient more than half of which was spent in counselling about the above mentioned conditions  and coordination of care   I have discontinued Shirline B. Lenhard's sertraline. I am also having her start on predniSONE. Additionally, I am having her maintain her fexofenadine-pseudoephedrine, fluticasone, prednisoLONE acetate, meloxicam, traZODone, fenofibrate, levothyroxine, and clotrimazole-betamethasone.  Meds ordered this encounter  Medications  . predniSONE (DELTASONE) 10 MG tablet    Sig: 6 tablets on Day 1 , then reduce by 1 tablet daily until gone    Dispense:  21 tablet    Refill:  0    Medications Discontinued During This Encounter  Medication Reason  . sertraline (ZOLOFT) 50 MG tablet Completed Course    Follow-up: Return in about 6 months (around 09/25/2018) for CPE.   [name], MD

## 2018-03-26 NOTE — Telephone Encounter (Signed)
Refilled: 10/08/2017 Last OV: 03/26/2018 Next OV: 09/27/2018

## 2018-03-26 NOTE — Patient Instructions (Addendum)
I want you to lose 10 lbs by your next visit In 6 months  Start walking for 30 minutes daily    For your itching ,  You can use Benadryl at bedtime but you should also consider adding one of these newer second generation antihistamines that are longer acting, non sedating and  available OTC:  Generic  Zyrtec, which is cetirizine.    generic Allegra , available generically as fexofenadine ; comes in 60 mg and 180 mg once daily strengths.    Generic Claritin :  also available as loratidine .    6 day steroid taper    There are low carb breads that taste good,  Try Sola  (frozen  Bread section)   To make a low carb chip :  Take the Joseph's Lavash or Pita bread,  Or the [place] Low carb whole wheat tortilla   Place on metal cookie sheet  Brush with olive oil  Sprinkle garlic powder (NOT garlic salt), grated parmesan cheese, mediterranean seasoning , or all of them?  Bake at 275 for 30 minutes   We have substitutions for your potatoes!!  Try the mashed cauliflower and riced cauliflower dishes instead of rice and mashed potatoes  Mashed turnips are also very low carb!   For desserts :  Try the Dannon Lt n Fit greek yogurt dessert flavors and top with reddi Whip .  8 carbs,  80 calories  Try Oikos Triple Zero [place] Yogurt in the salted caramel, and the coffee flavors  With Whipped Cream for dessert  breyer's low carb ice cream, available in bars (on a stick, better ) or scoopable ice cream  HERE ARE THE LOW CARB  BREAD CHOICES

## 2018-03-28 DIAGNOSIS — L247 Irritant contact dermatitis due to plants, except food: Secondary | ICD-10-CM | POA: Insufficient documentation

## 2018-03-28 NOTE — Assessment & Plan Note (Signed)
I have addressed  BMI and recommended a low glycemic index diet.  I have also recommended that patient start exercising with a goal of 30 minutes of aerobic exercise a minimum of 5 days per week.

## 2018-03-28 NOTE — Assessment & Plan Note (Signed)
Thyroid function is WNL on current dose.  No current changes needed.   Lab Results  Component Value Date   TSH 2.80 03/26/2018

## 2018-03-28 NOTE — Assessment & Plan Note (Signed)
S/p IM injection of steroid recently,  Persistence of itching.  No cellulitis.  Steroid taper prescribed , 2nd generation antihistamine advised for itching

## 2018-03-28 NOTE — Assessment & Plan Note (Addendum)
Now managed with zoloft and alprazolam and trazodone.   The risks and benefits of benzodiazepine use were reviewed with patient today including excessive sedation leading to respiratory depression,  impaired thinking/driving, and addiction.  No changes today

## 2018-03-28 NOTE — Assessment & Plan Note (Signed)
She has been checking morning fasting sugars which have never been above 110 and post prandials in the 90's. a1c is below the threshhold for prediabetes  Lab Results  Component Value Date   HGBA1C 5.5 03/26/2018

## 2018-04-08 DIAGNOSIS — F331 Major depressive disorder, recurrent, moderate: Secondary | ICD-10-CM | POA: Diagnosis not present

## 2018-04-08 DIAGNOSIS — F429 Obsessive-compulsive disorder, unspecified: Secondary | ICD-10-CM | POA: Diagnosis not present

## 2018-04-08 DIAGNOSIS — F411 Generalized anxiety disorder: Secondary | ICD-10-CM | POA: Diagnosis not present

## 2018-05-07 DIAGNOSIS — J209 Acute bronchitis, unspecified: Secondary | ICD-10-CM | POA: Diagnosis not present

## 2018-05-24 ENCOUNTER — Other Ambulatory Visit: Payer: Self-pay | Admitting: Internal Medicine

## 2018-06-18 DIAGNOSIS — H9011 Conductive hearing loss, unilateral, right ear, with unrestricted hearing on the contralateral side: Secondary | ICD-10-CM | POA: Diagnosis not present

## 2018-06-18 DIAGNOSIS — H6980 Other specified disorders of Eustachian tube, unspecified ear: Secondary | ICD-10-CM | POA: Diagnosis not present

## 2018-07-01 DIAGNOSIS — H6981 Other specified disorders of Eustachian tube, right ear: Secondary | ICD-10-CM | POA: Diagnosis not present

## 2018-07-01 DIAGNOSIS — H6980 Other specified disorders of Eustachian tube, unspecified ear: Secondary | ICD-10-CM | POA: Diagnosis not present

## 2018-08-17 ENCOUNTER — Other Ambulatory Visit: Payer: Self-pay | Admitting: Internal Medicine

## 2018-08-30 ENCOUNTER — Other Ambulatory Visit: Payer: Self-pay | Admitting: Internal Medicine

## 2018-09-16 ENCOUNTER — Encounter: Payer: Self-pay | Admitting: Internal Medicine

## 2018-09-27 ENCOUNTER — Encounter: Payer: Medicare Other | Admitting: Internal Medicine

## 2018-11-22 ENCOUNTER — Other Ambulatory Visit: Payer: Self-pay | Admitting: Internal Medicine

## 2018-12-06 ENCOUNTER — Other Ambulatory Visit: Payer: Self-pay | Admitting: Internal Medicine

## 2018-12-06 ENCOUNTER — Telehealth: Payer: Self-pay

## 2018-12-06 NOTE — Telephone Encounter (Signed)
Copied from [place] [id number]. Topic: Appointment Scheduling - Scheduling Inquiry for Clinic >> Dec 03, 2018  4:55 PM Rainey Pines A wrote: Patient would like to schedule a visit for a follow up

## 2018-12-07 NOTE — Telephone Encounter (Signed)
Lm to call office and set up a virtual with Dr. Derrel Nip.

## 2018-12-18 ENCOUNTER — Other Ambulatory Visit: Payer: Self-pay | Admitting: Internal Medicine

## 2018-12-20 NOTE — Telephone Encounter (Signed)
Refilled: 03/26/2018 Last OV: 03/26/2018 Next OV: 01/26/2019

## 2019-01-25 ENCOUNTER — Telehealth: Payer: Self-pay | Admitting: *Deleted

## 2019-01-25 NOTE — Telephone Encounter (Signed)
Copied from [place] [id number]. Topic: General - Other >> Jan 25, 2019  2:33 PM Keene Breath wrote: Reason for CRM: Patient is returning a call regarding her upcoming appt.  CB# [phone]

## 2019-01-26 ENCOUNTER — Other Ambulatory Visit: Payer: Self-pay

## 2019-01-26 ENCOUNTER — Encounter: Payer: Self-pay | Admitting: Internal Medicine

## 2019-01-26 ENCOUNTER — Ambulatory Visit (INDEPENDENT_AMBULATORY_CARE_PROVIDER_SITE_OTHER): Payer: PPO | Admitting: Internal Medicine

## 2019-01-26 VITALS — BP 128/72 | HR 79 | Temp 98.5°F | Resp 15 | Ht 61.0 in | Wt 171.2 lb

## 2019-01-26 DIAGNOSIS — F411 Generalized anxiety disorder: Secondary | ICD-10-CM

## 2019-01-26 DIAGNOSIS — F324 Major depressive disorder, single episode, in partial remission: Secondary | ICD-10-CM

## 2019-01-26 DIAGNOSIS — F430 Acute stress reaction: Secondary | ICD-10-CM

## 2019-01-26 DIAGNOSIS — R7301 Impaired fasting glucose: Secondary | ICD-10-CM | POA: Diagnosis not present

## 2019-01-26 DIAGNOSIS — G40909 Epilepsy, unspecified, not intractable, without status epilepticus: Secondary | ICD-10-CM | POA: Diagnosis not present

## 2019-01-26 DIAGNOSIS — Z1231 Encounter for screening mammogram for malignant neoplasm of breast: Secondary | ICD-10-CM | POA: Diagnosis not present

## 2019-01-26 DIAGNOSIS — E669 Obesity, unspecified: Secondary | ICD-10-CM | POA: Diagnosis not present

## 2019-01-26 DIAGNOSIS — E119 Type 2 diabetes mellitus without complications: Secondary | ICD-10-CM | POA: Diagnosis not present

## 2019-01-26 DIAGNOSIS — E785 Hyperlipidemia, unspecified: Secondary | ICD-10-CM | POA: Diagnosis not present

## 2019-01-26 DIAGNOSIS — E039 Hypothyroidism, unspecified: Secondary | ICD-10-CM | POA: Diagnosis not present

## 2019-01-26 DIAGNOSIS — Z0000 Encounter for general adult medical examination without abnormal findings: Secondary | ICD-10-CM

## 2019-01-26 MED ORDER — TRAZODONE HCL 50 MG PO TABS: 50.0000 mg | ORAL_TABLET | Freq: Every evening | ORAL | 1 refills | Status: DC | PRN

## 2019-01-26 NOTE — Progress Notes (Signed)
Patient ID: Lorraine Mills, female    DOB: [birth year]  Age: 68 y.o. MRN: [id number]  The patient is here for annual follow up and examination and management of other chronic and acute problems.   The risk factors are reflected in the social history.  The roster of all physicians providing medical care to patient - is listed in the Snapshot section of the chart.  Activities of daily living:  The patient is 100% independent in all ADLs: dressing, toileting, feeding as well as independent mobility  Home safety : The patient has smoke detectors in the home. They wear seatbelts.  There are no firearms at home. There is no violence in the home.   There is no risks for hepatitis, STDs or HIV. There is no   history of blood transfusion. They have no travel history to infectious disease endemic areas of the world.  The patient has seen their dentist in the last six month. They have seen their eye doctor in the last year. They admit to slight hearing difficulty with regard to whispered voices and some television programs.  They have deferred audiologic testing in the last year.  They do not  have excessive sun exposure. Discussed the need for sun protection: hats, long sleeves and use of sunscreen if there is significant sun exposure.   Diet: the importance of a healthy diet is discussed. They do have a healthy diet.  The benefits of regular aerobic exercise were discussed. She is not walking daily.   Depression screen: there are no signs or vegative symptoms of depression- irritability, change in appetite, anhedonia, sadness/tearfullness.  Cognitive assessment: the patient manages all their financial and personal affairs and is actively engaged. They could relate day,date,year and events; recalled 2/3 objects at 3 minutes; performed clock-face test normally.  The following portions of the patient's history were reviewed and updated as appropriate: allergies, current medications, past family history, past  medical history,  past surgical history, past social history  and problem list.  Visual acuity was not assessed per patient preference since she has regular follow up with her ophthalmologist. Hearing and body mass index were assessed and reviewed.   During the course of the visit the patient was educated and counseled about appropriate screening and preventive services including : fall prevention , diabetes screening, nutrition counseling, colorectal cancer screening, and recommended immunizations.    CC: The primary encounter diagnosis was Controlled type 2 diabetes mellitus without complication, without long-term current use of insulin ([place]). Diagnoses of Encounter for screening mammogram for breast cancer, Impaired fasting glucose, Acquired hypothyroidism, Dyslipidemia (high LDL; low HDL), Routine general medical examination at a health care facility, Major depressive disorder with single episode, in partial remission ([place]), Seizure disorder ([place]), Obesity (BMI 30.0-34.9), and Anxiety as acute reaction to gross stress were also pertinent to this visit.   1) episodes /days of feeling very nervous,  Uses 1/2 alprazolam prn . Started after husband fell asleep on the bed with the grandbaby AND BABY FELL OFF THE BED,  But was not hurt   History Lorraine Mills has a past medical history of Anxiety, Goiter, Hyperlipidemia, and Seasonal allergies.   She has a past surgical history that includes Bilateral oophorectomy (2001); Abdominal hysterectomy; Corneal transplant (Right); Cataract extraction (Right); Colonoscopy (08/17/2009); Breast biopsy (Left, 1985); and Colonoscopy with propofol (N/A, 03/19/2016).   Her family history includes Atrial fibrillation in her brother; Breast cancer (age of onset: 34) in her sister; Cancer in her mother; Stroke in her  father; Thyroid disease in an other family member.She reports that she quit smoking about 20 years ago. Her smoking use included cigarettes. She quit after 24.00  years of use. She has never used smokeless tobacco. She reports current alcohol use. She reports that she does not use drugs.  Outpatient Medications Prior to Visit  Medication Sig Dispense Refill  . ALPRAZolam (XANAX) 0.5 MG tablet TAKE ONE TABLET BY MOUTH AT BEDTIME AS NEEDED FOR ANXIETY 30 tablet 1  . fenofibrate (TRICOR) 48 MG tablet TAKE 1 TABLET BY MOUTH DAILY 90 tablet 1  . fexofenadine-pseudoephedrine (ALLEGRA-D 24) 180-240 MG per 24 hr tablet Take 1 tablet by mouth daily.    . fluticasone (FLONASE) 50 MCG/ACT nasal spray Place 2 sprays into the nose daily.    Marland Kitchen levothyroxine (SYNTHROID) 75 MCG tablet TAKE 1 TABLET EVERY DAY ON EMPTY STOMACHWITH A GLASS OF WATER AT LEAST 30-60 MINBEFORE BREAKFAST 90 tablet 0  . prednisoLONE acetate (PRED FORTE) 1 % ophthalmic suspension Place 1 drop into the right eye 4 (four) times daily.    . clotrimazole-betamethasone (LOTRISONE) cream   0  . meloxicam (MOBIC) 15 MG tablet Take 1 tablet (15 mg total) by mouth daily. (Patient not taking: Reported on 01/26/2019) 30 tablet 4  . predniSONE (DELTASONE) 10 MG tablet 6 tablets on Day 1 , then reduce by 1 tablet daily until gone (Patient not taking: Reported on 01/26/2019) 21 tablet 0  . traZODone (DESYREL) 50 MG tablet Take 0.5-1 tablets (25-50 mg total) by mouth at bedtime as needed for sleep. (Patient not taking: Reported on 01/26/2019) 30 tablet 3   No facility-administered medications prior to visit.     Review of Systems   Patient denies headache, fevers, malaise, unintentional weight loss, skin rash, eye pain, sinus congestion and sinus pain, sore throat, dysphagia,  hemoptysis , cough, dyspnea, wheezing, chest pain, palpitations, orthopnea, edema, abdominal pain, nausea, melena, diarrhea, constipation, flank pain, dysuria, hematuria, urinary  Frequency, nocturia, numbness, tingling, seizures,  Focal weakness, Loss of consciousness,  Tremor, insomnia, depression, anxiety, and suicidal ideation.       Objective:  BP 128/72 (BP Location: Left Arm, Patient Position: Sitting, Cuff Size: Large)   Pulse 79   Temp 98.5 F (36.9 C) (Oral)   Resp 15   Ht 5\' 1"  (1.549 m)   Wt 171 lb 3.2 oz (77.7 kg)   SpO2 97%   BMI 32.35 kg/m   Physical Exam  General appearance: alert, cooperative and appears stated age Head: Normocephalic, without obvious abnormality, atraumatic Eyes: conjunctivae/corneas clear. PERRL, EOM's intact. Fundi benign. Ears: normal TM's and external ear canals both ears Nose: Nares normal. Septum midline. Mucosa normal. No drainage or sinus tenderness. Throat: lips, mucosa, and tongue normal; teeth and gums normal Neck: no adenopathy, no carotid bruit, no JVD, supple, symmetrical, trachea midline and thyroid not enlarged, symmetric, no tenderness/mass/nodules Lungs: clear to auscultation bilaterally Breasts: normal appearance, no masses or tenderness Heart: regular rate and rhythm, S1, S2 normal, no murmur, click, rub or gallop Abdomen: soft, non-tender; bowel sounds normal; no masses,  no organomegaly Extremities: extremities normal, atraumatic, no cyanosis or edema Pulses: 2+ and symmetric Skin: Skin color, texture, turgor normal. No rashes or lesions Neurologic: Alert and oriented X 3, normal strength and tone. Normal symmetric reflexes. Normal coordination and gait.     Assessment & Plan:   Problem List Items Addressed This Visit      Unprioritized   Hypothyroidism    Thyroid function is due  for assessment   Lab Results  Component Value Date   TSH 2.80 03/26/2018         Relevant Orders   TSH   Routine general medical examination at a health care facility    age appropriate education and counseling updated, referrals for preventative services and immunizations addressed, dietary and smoking counseling addressed, most recent labs reviewed.  I have personally reviewed and have noted:  1) the patient's medical and social history 2) The pt's use of  alcohol, tobacco, and illicit drugs 3) The patient's current medications and supplements 4) Functional ability including ADL's, fall risk, home safety risk, hearing and visual impairment 5) Diet and physical activities 6) Evidence for depression or mood disorder 7) The patient's height, weight, and BMI have been recorded in the chart  I have made referrals, and provided counseling and education based on review of the above      Dyslipidemia (high LDL; low HDL)    Taking fenofibrate.  Using the Framingham risk calculator,  her 10 year risk of coronary artery disease is 9.7%.repeat assessment due Lab Results  Component Value Date   CHOL 201 (H) 03/26/2018   HDL 52.60 03/26/2018   LDLCALC 124 (H) 03/26/2018   LDLDIRECT 117.0 09/01/2017   TRIG 122.0 03/26/2018   CHOLHDL 4 03/26/2018         Relevant Orders   Lipid panel   Obesity (BMI 30.0-34.9)    I have addressed  BMI and recommended a low glycemic index diet utilizing smaller more frequent meals to increase metabolism.  I have also recommended that patient start exercising with a goal of 30 minutes of aerobic exercise a minimum of 5 days per week. Screening for lipid disorders, thyroid and diabetes to be done today.        RESOLVED: Seizure disorder ([place])   Impaired fasting glucose     a1c is below the threshhold for prediabetes  Lab Results  Component Value Date   HGBA1C 5.5 03/26/2018         Relevant Orders   Comprehensive metabolic panel   Hemoglobin A1c   RESOLVED: Major depressive disorder with single episode, in partial remission ([place])   Relevant Medications   traZODone (DESYREL) 50 MG tablet   RESOLVED: Controlled type 2 diabetes mellitus without complication, without long-term current use of insulin ([facility]) - Primary   Anxiety as acute reaction to gross stress    The risks and benefits of benzodiazepine use were discussed with patient today including excessive sedation leading to respiratory depression,   impaired thinking/driving, and addiction.  Patient was advised to avoid concurrent use with alcohol, to use medication only as needed and not to share with others  .       Relevant Medications   traZODone (DESYREL) 50 MG tablet    Other Visit Diagnoses    Encounter for screening mammogram for breast cancer       Relevant Orders   MM 3D SCREEN BREAST BILATERAL      I have discontinued Erminie B. Dilling's meloxicam, traZODone, clotrimazole-betamethasone, and predniSONE. I am also having her start on traZODone. Additionally, I am having her maintain her fexofenadine-pseudoephedrine, fluticasone, prednisoLONE acetate, fenofibrate, levothyroxine, and ALPRAZolam.  Meds ordered this encounter  Medications  . traZODone (DESYREL) 50 MG tablet    Sig: Take 1 tablet (50 mg total) by mouth at bedtime as needed for sleep.    Dispense:  90 tablet    Refill:  1    Medications Discontinued  During This Encounter  Medication Reason  . clotrimazole-betamethasone (LOTRISONE) cream Patient has not taken in last 30 days  . predniSONE (DELTASONE) 10 MG tablet Patient has not taken in last 30 days  . meloxicam (MOBIC) 15 MG tablet Patient has not taken in last 30 days  . traZODone (DESYREL) 50 MG tablet Patient has not taken in last 30 days   A total of 40 minutes was spent with patient more than half of which was spent in counseling patient on the above mentioned issues , reviewing and explaining recent labs and imaging studies done, and coordination of care.  Follow-up: No follow-ups on file.   [name], MD

## 2019-01-26 NOTE — Patient Instructions (Signed)
please MAKE AN APPOINTMENT  to make a fasting labs appt.  The labs have been ordered.   Your annual mammogram has been ordered.  You are encouraged (required) to call to make your appointment at [facility]  [phone]  USE THE TRAZODONE.  YOU CAN TAKE UP TO 100 MG DAILY    Health Maintenance After Age 68 After age 63, you are at a higher risk for certain long-term diseases and infections as well as injuries from falls. Falls are a major cause of broken bones and head injuries in people who are older than age 54. Getting regular preventive care can help to keep you healthy and well. Preventive care includes getting regular testing and making lifestyle changes as recommended by your health care provider. Talk with your health care provider about:  Which screenings and tests you should have. A screening is a test that checks for a disease when you have no symptoms.  A diet and exercise plan that is right for you. What should I know about screenings and tests to prevent falls? Screening and testing are the best ways to find a health problem early. Early diagnosis and treatment give you the best chance of managing medical conditions that are common after age 63. Certain conditions and lifestyle choices may make you more likely to have a fall. Your health care provider may recommend:  Regular vision checks. Poor vision and conditions such as cataracts can make you more likely to have a fall. If you wear glasses, make sure to get your prescription updated if your vision changes.  Medicine review. Work with your health care provider to regularly review all of the medicines you are taking, including over-the-counter medicines. Ask your health care provider about any side effects that may make you more likely to have a fall. Tell your health care provider if any medicines that you take make you feel dizzy or sleepy.  Osteoporosis screening. Osteoporosis is a condition that causes the bones to get weaker.  This can make the bones weak and cause them to break more easily.  Blood pressure screening. Blood pressure changes and medicines to control blood pressure can make you feel dizzy.  Strength and balance checks. Your health care provider may recommend certain tests to check your strength and balance while standing, walking, or changing positions.  Foot health exam. Foot pain and numbness, as well as not wearing proper footwear, can make you more likely to have a fall.  Depression screening. You may be more likely to have a fall if you have a fear of falling, feel emotionally low, or feel unable to do activities that you used to do.  Alcohol use screening. Using too much alcohol can affect your balance and may make you more likely to have a fall. What actions can I take to lower my risk of falls? General instructions  Talk with your health care provider about your risks for falling. Tell your health care provider if: ? You fall. Be sure to tell your health care provider about all falls, even ones that seem minor. ? You feel dizzy, sleepy, or off-balance.  Take over-the-counter and prescription medicines only as told by your health care provider. These include any supplements.  Eat a healthy diet and maintain a healthy weight. A healthy diet includes low-fat dairy products, low-fat (lean) meats, and fiber from whole grains, beans, and lots of fruits and vegetables. Home safety  Remove any tripping hazards, such as rugs, cords, and clutter.  Install  safety equipment such as grab bars in bathrooms and safety rails on stairs.  Keep rooms and walkways well-lit. Activity   Follow a regular exercise program to stay fit. This will help you maintain your balance. Ask your health care provider what types of exercise are appropriate for you.  If you need a cane or walker, use it as recommended by your health care provider.  Wear supportive shoes that have nonskid soles. Lifestyle  Do not  drink alcohol if your health care provider tells you not to drink.  If you drink alcohol, limit how much you have: ? 0-1 drink a day for women. ? 0-2 drinks a day for men.  Be aware of how much alcohol is in your drink. In the [place] one drink equals one typical bottle of beer (12 oz), one-half glass of wine (5 oz), or one shot of hard liquor (1 oz).  Do not use any products that contain nicotine or tobacco, such as cigarettes and e-cigarettes. If you need help quitting, ask your health care provider. Summary  Having a healthy lifestyle and getting preventive care can help to protect your health and wellness after age 81.  Screening and testing are the best way to find a health problem early and help you avoid having a fall. Early diagnosis and treatment give you the best chance for managing medical conditions that are more common for people who are older than age 61.  Falls are a major cause of broken bones and head injuries in people who are older than age 55. Take precautions to prevent a fall at home.  Work with your health care provider to learn what changes you can make to improve your health and wellness and to prevent falls. This information is not intended to replace advice given to you by your health care provider. Make sure you discuss any questions you have with your health care provider. Document Released: 04/08/2017 Document Revised: 09/16/2018 Document Reviewed: 04/08/2017 Elsevier Patient Education  2020 [employer].

## 2019-01-29 DIAGNOSIS — F324 Major depressive disorder, single episode, in partial remission: Secondary | ICD-10-CM | POA: Insufficient documentation

## 2019-01-29 DIAGNOSIS — E119 Type 2 diabetes mellitus without complications: Secondary | ICD-10-CM | POA: Insufficient documentation

## 2019-01-29 DIAGNOSIS — F5105 Insomnia due to other mental disorder: Secondary | ICD-10-CM | POA: Insufficient documentation

## 2019-01-29 DIAGNOSIS — F409 Phobic anxiety disorder, unspecified: Secondary | ICD-10-CM | POA: Insufficient documentation

## 2019-01-29 DIAGNOSIS — F411 Generalized anxiety disorder: Secondary | ICD-10-CM | POA: Insufficient documentation

## 2019-01-29 NOTE — Assessment & Plan Note (Signed)
Thyroid function is due for assessment   Lab Results  Component Value Date   TSH 2.80 03/26/2018

## 2019-01-29 NOTE — Assessment & Plan Note (Signed)
a1c is below the threshhold for prediabetes  Lab Results  Component Value Date   HGBA1C 5.5 03/26/2018

## 2019-01-29 NOTE — Assessment & Plan Note (Signed)
Taking fenofibrate.  Using the Framingham risk calculator,  her 10 year risk of coronary artery disease is 9.7%.repeat assessment due Lab Results  Component Value Date   CHOL 201 (H) 03/26/2018   HDL 52.60 03/26/2018   LDLCALC 124 (H) 03/26/2018   LDLDIRECT 117.0 09/01/2017   TRIG 122.0 03/26/2018   CHOLHDL 4 03/26/2018

## 2019-01-29 NOTE — Assessment & Plan Note (Signed)
I have addressed  BMI and recommended a low glycemic index diet utilizing smaller more frequent meals to increase metabolism.  I have also recommended that patient start exercising with a goal of 30 minutes of aerobic exercise a minimum of 5 days per week. Screening for lipid disorders, thyroid and diabetes to be done today.   

## 2019-01-29 NOTE — Assessment & Plan Note (Signed)

## 2019-01-29 NOTE — Assessment & Plan Note (Signed)
The risks and benefits of benzodiazepine use were discussed with patient today including excessive sedation leading to respiratory depression,  impaired thinking/driving, and addiction.  Patient was advised to avoid concurrent use with alcohol, to use medication only as needed and not to share with others  .

## 2019-02-09 ENCOUNTER — Other Ambulatory Visit: Payer: Self-pay

## 2019-02-09 ENCOUNTER — Other Ambulatory Visit (INDEPENDENT_AMBULATORY_CARE_PROVIDER_SITE_OTHER): Payer: PPO

## 2019-02-09 DIAGNOSIS — E039 Hypothyroidism, unspecified: Secondary | ICD-10-CM

## 2019-02-09 DIAGNOSIS — R7301 Impaired fasting glucose: Secondary | ICD-10-CM | POA: Diagnosis not present

## 2019-02-09 DIAGNOSIS — E785 Hyperlipidemia, unspecified: Secondary | ICD-10-CM

## 2019-02-09 LAB — LIPID PANEL
Cholesterol: 212 mg/dL — ABNORMAL HIGH (ref 0–200)
HDL: 45.2 mg/dL (ref >39.00)
NonHDL: 166.38
Total CHOL/HDL Ratio: 5
Triglycerides: 211 mg/dL — ABNORMAL HIGH (ref 0.0–149.0)
VLDL: 42.2 mg/dL — ABNORMAL HIGH (ref 0.0–40.0)

## 2019-02-09 LAB — COMPREHENSIVE METABOLIC PANEL
ALT: 31 U/L (ref 0–35)
AST: 26 U/L (ref 0–37)
Albumin: 4.4 g/dL (ref 3.5–5.2)
Alkaline Phosphatase: 44 U/L (ref 39–117)
BUN: 17 mg/dL (ref 6–23)
CO2: 29 mEq/L (ref 19–32)
Calcium: 9.6 mg/dL (ref 8.4–10.5)
Chloride: 106 mEq/L (ref 96–112)
Creatinine, Ser: 0.89 mg/dL (ref 0.40–1.20)
GFR: 63.11 mL/min (ref >60.00)
Glucose, Bld: 97 mg/dL (ref 70–99)
Potassium: 4.5 mEq/L (ref 3.5–5.1)
Sodium: 142 mEq/L (ref 135–145)
Total Bilirubin: 0.5 mg/dL (ref 0.2–1.2)
Total Protein: 6.9 g/dL (ref 6.0–8.3)

## 2019-02-09 LAB — HEMOGLOBIN A1C: Hgb A1c MFr Bld: 5.6 % (ref 4.6–6.5)

## 2019-02-09 LAB — LDL CHOLESTEROL, DIRECT: Direct LDL: 112 mg/dL

## 2019-02-09 LAB — TSH: TSH: 3.21 u[IU]/mL (ref 0.35–4.50)

## 2019-02-10 ENCOUNTER — Other Ambulatory Visit: Payer: PPO

## 2019-02-11 ENCOUNTER — Other Ambulatory Visit: Payer: Self-pay | Admitting: Internal Medicine

## 2019-02-11 NOTE — Telephone Encounter (Signed)
Refilled: 12/20/2018 Last OV: 01/26/2019 Next OV: 03/28/2019

## 2019-02-18 ENCOUNTER — Encounter: Payer: Self-pay | Admitting: Internal Medicine

## 2019-03-16 ENCOUNTER — Other Ambulatory Visit: Payer: Self-pay | Admitting: Internal Medicine

## 2019-03-16 NOTE — Telephone Encounter (Signed)
Medication Refill - Medication: levothyroxine (SYNTHROID) 75 MCG tablet/Pt stated she requested refill last week through pharmacy. She was down to one tablet now. Pharmacy did give her short supply for 3 days. Please advise.  Has the patient contacted their pharmacy? Yes.   (Agent: If no, request that the patient contact the pharmacy for the refill.) (Agent: If yes, when and what did the pharmacy advise?)  Preferred Pharmacy (with phone number or street name):  [place], [place] - [place] [phone] (Phone) [phone] (Fax)     Agent: Please be advised that RX refills may take up to 3 business days. We ask that you follow-up with your pharmacy.

## 2019-03-28 ENCOUNTER — Ambulatory Visit (INDEPENDENT_AMBULATORY_CARE_PROVIDER_SITE_OTHER): Payer: PPO

## 2019-03-28 ENCOUNTER — Ambulatory Visit (INDEPENDENT_AMBULATORY_CARE_PROVIDER_SITE_OTHER): Payer: PPO | Admitting: Internal Medicine

## 2019-03-28 ENCOUNTER — Other Ambulatory Visit: Payer: Self-pay

## 2019-03-28 ENCOUNTER — Encounter: Payer: Self-pay | Admitting: Internal Medicine

## 2019-03-28 DIAGNOSIS — E669 Obesity, unspecified: Secondary | ICD-10-CM | POA: Diagnosis not present

## 2019-03-28 DIAGNOSIS — R7301 Impaired fasting glucose: Secondary | ICD-10-CM

## 2019-03-28 DIAGNOSIS — E039 Hypothyroidism, unspecified: Secondary | ICD-10-CM

## 2019-03-28 DIAGNOSIS — Z0000 Encounter for general adult medical examination without abnormal findings: Secondary | ICD-10-CM | POA: Diagnosis not present

## 2019-03-28 DIAGNOSIS — F430 Acute stress reaction: Secondary | ICD-10-CM | POA: Diagnosis not present

## 2019-03-28 DIAGNOSIS — E785 Hyperlipidemia, unspecified: Secondary | ICD-10-CM

## 2019-03-28 DIAGNOSIS — F411 Generalized anxiety disorder: Secondary | ICD-10-CM

## 2019-03-28 MED ORDER — ROSUVASTATIN CALCIUM 10 MG PO TABS: 10.0000 mg | ORAL_TABLET | Freq: Every day | ORAL | 5 refills | Status: DC

## 2019-03-28 NOTE — Assessment & Plan Note (Addendum)
a1c remains below the threshhold for prediabetes.    Lab Results  Component Value Date   HGBA1C 5.6 02/09/2019

## 2019-03-28 NOTE — Patient Instructions (Signed)
Finish your current bottle of fenofibrate,  Then start generic Crestor for your hyperlipidedmia  I want you to lose 12 lbs over the next 3 months to lower your BMI to < 30  Low glycemic index diet and daily 30 minute walks.   Repeat labs in 6 months

## 2019-03-28 NOTE — Patient Instructions (Addendum)
  Ms. Roston , Thank you for taking time to come for your Medicare Wellness Visit. I appreciate your ongoing commitment to your health goals. Please review the following plan we discussed and let me know if I can assist you in the future.   These are the goals we discussed: Goals    . Lose weight     Exercise 150 minutes per week (5 days, 30 minutes) Stay hydrated Low carb diet       This is a list of the screening recommended for you and due dates:  Health Maintenance  Topic Date Due  . Complete foot exam   04/17/1961  . Pneumonia vaccines (2 of 2 - PPSV23) 09/25/2018  . Mammogram  12/02/2018  . Urine Protein Check  03/27/2019  . Flu Shot  09/07/2019*  . Eye exam for diabetics  06/24/2019  . Hemoglobin A1C  08/09/2019  . Tetanus Vaccine  10/08/2022  . Colon Cancer Screening  03/19/2026  . DEXA scan (bone density measurement)  Completed  .  Hepatitis C: One time screening is recommended by Center for Disease Control  (CDC) for  adults born from [birth year] through 1965.   Completed  *Topic was postponed. The date shown is not the original due date.

## 2019-03-28 NOTE — Assessment & Plan Note (Addendum)
Changing to rosuvastatin for LDL of 112 and FRC risk > 15%.  Stopping fenofibrate  Lab Results  Component Value Date   LDLCALC 124 (H) 03/26/2018   Lab Results  Component Value Date   CHOL 212 (H) 02/09/2019   HDL 45.20 02/09/2019   LDLCALC 124 (H) 03/26/2018   LDLDIRECT 112.0 02/09/2019   TRIG 211.0 (H) 02/09/2019   CHOLHDL 5 02/09/2019

## 2019-03-28 NOTE — Assessment & Plan Note (Signed)
Thyroid function is WNL on current dose.  No current changes needed.   Lab Results  Component Value Date   TSH 3.21 02/09/2019

## 2019-03-28 NOTE — Progress Notes (Signed)
Subjective:   Lorraine WANDER is a 68 y.o. female who presents for Medicare Annual (Subsequent) preventive examination.  Review of Systems:  No ROS.  Medicare Wellness Virtual Visit.  Visual/audio telehealth visit, UTA vital signs.   See social history for additional risk factors.   Cardiac Risk Factors include: advanced age (>20men, >37 women);diabetes mellitus     Objective:     Vitals: There were no vitals taken for this visit.  There is no height or weight on file to calculate BMI.  Advanced Directives 03/28/2019 02/05/2018 03/25/2016 03/19/2016 03/25/2015  Does Patient Have a Medical Advance Directive? Yes No No No No  Does patient want to make changes to medical advance directive? No - Patient declined Yes (MAU/Ambulatory/Procedural Areas - Information given) - - -  Would patient like information on creating a medical advance directive? - - Yes - Educational materials given - No - patient declined information    Tobacco Social History   Tobacco Use  Smoking Status Former Smoker  . Years: 24.00  . Types: Cigarettes  . Quit date: 05/09/1998  . Years since quitting: 20.8  Smokeless Tobacco Never Used     Counseling given: Not Answered   Clinical Intake:  Pre-visit preparation completed: Yes        Diabetes: No  How often do you need to have someone help you when you read instructions, pamphlets, or other written materials from your doctor or pharmacy?: 1 - Never  Interpreter Needed?: No     Past Medical History:  Diagnosis Date  . Anxiety   . Goiter   . Hyperlipidemia    TGs  . Seasonal allergies    Past Surgical History:  Procedure Laterality Date  . ABDOMINAL HYSTERECTOMY     supracervical   . BILATERAL OOPHORECTOMY  2001   BENIGN TUMOR  . BREAST BIOPSY Left 1985   cyst   . CATARACT EXTRACTION Right   . COLONOSCOPY  08/17/2009  . COLONOSCOPY WITH PROPOFOL N/A 03/19/2016   Procedure: COLONOSCOPY WITH PROPOFOL;  Surgeon: Robert Bellow, MD;   Location: [facility] ENDOSCOPY;  Service: Endoscopy;  Laterality: N/A;  . CORNEAL TRANSPLANT Right    Family History  Problem Relation Age of Onset  . Cancer Mother        OVARIAN  . Stroke Father   . Breast cancer Sister 93  . Thyroid disease Other        MULTIPLE FAMILY MEMBERS  . Atrial fibrillation Brother    Social History   Socioeconomic History  . Marital status: Married    Spouse name: Not on file  . Number of children: Not on file  . Years of education: Not on file  . Highest education level: Not on file  Occupational History  . Not on file  Social Needs  . Financial resource strain: Not hard at all  . Food insecurity    Worry: Never true    Inability: Never true  . Transportation needs    Medical: No    Non-medical: No  Tobacco Use  . Smoking status: Former Smoker    Years: 24.00    Types: Cigarettes    Quit date: 05/09/1998    Years since quitting: 20.8  . Smokeless tobacco: Never Used  Substance and Sexual Activity  . Alcohol use: Yes    Alcohol/week: 0.0 standard drinks    Comment: once a week  . Drug use: No  . Sexual activity: Not on file  Lifestyle  . Physical  activity    Days per week: 2 days    Minutes per session: 30 min  . Stress: Not at all  Relationships  . Social Herbalist on phone: Not on file    Gets together: Not on file    Attends religious service: Not on file    Active member of club or organization: Not on file    Attends meetings of clubs or organizations: Not on file    Relationship status: Married  Other Topics Concern  . Not on file  Social History Narrative  . Not on file    Outpatient Encounter Medications as of 03/28/2019  Medication Sig  . ALPRAZolam (XANAX) 0.5 MG tablet TAKE 1 TABLET BY MOUTH AT BEDTIME AS NEEDED FOR ANXIETY  . fenofibrate (TRICOR) 48 MG tablet TAKE 1 TABLET BY MOUTH DAILY  . fexofenadine-pseudoephedrine (ALLEGRA-D 24) 180-240 MG per 24 hr tablet Take 1 tablet by mouth daily.  .  fluticasone (FLONASE) 50 MCG/ACT nasal spray Place 2 sprays into the nose daily.  Marland Kitchen levothyroxine (SYNTHROID) 75 MCG tablet TAKE 1 TABLET EVERY DAY ON EMPTY STOMACHWITH A GLASS OF WATER AT LEAST 30-60 MINBEFORE BREAKFAST  . prednisoLONE acetate (PRED FORTE) 1 % ophthalmic suspension Place 1 drop into the right eye 4 (four) times daily.  . traZODone (DESYREL) 50 MG tablet Take 1 tablet (50 mg total) by mouth at bedtime as needed for sleep.   No facility-administered encounter medications on file as of 03/28/2019.     Activities of Daily Living In your present state of health, do you have any difficulty performing the following activities: 03/28/2019  Hearing? N  Vision? N  Difficulty concentrating or making decisions? N  Walking or climbing stairs? N  Dressing or bathing? N  Doing errands, shopping? N  Preparing Food and eating ? N  Using the Toilet? N  In the past six months, have you accidently leaked urine? N  Do you have problems with loss of bowel control? N  Managing your Medications? N  Managing your Finances? N  Housekeeping or managing your Housekeeping? N  Some recent data might be hidden    Patient Care Team: [name], MD as PCP - General (Internal Medicine) [name], MD (Internal Medicine) Bary Castilla Forest Gleason, MD (General Surgery)    Assessment:   This is a routine wellness examination for Lorraine Mills. I connected with patient 03/28/19 at  9:30 AM EDT by an audio enabled telemedicine application and verified that I am speaking with the correct person using two identifiers. Patient stated full name and DOB. Patient gave permission to continue with virtual visit. Patient's location was at home and Nurse's location was at [place] office.   Health Maintenance Due: -Influenza vaccine 2020- discussed; to be completed in season with doctor or local pharmacy.   -PNA - discussed; to be completed with doctor in visit or local pharmacy.  -Mammogram- scheduled 04/18/19  -Foot Exam- followed by pcp Update all pending maintenance due as appropriate.   See completed HM at the end of note.   Eye: Visual acuity not assessed. Virtual visit. Wears corrective lenses. Followed by their ophthalmologist every 8 months.   Dental: Visits every 12 months.    Hearing: Followed by [facility] Difficulty hearing R ear; some hearing loss. Demonstrates normal hearing during visit.  Hearing aids- no  Safety:  Patient feels safe at home- yes Patient does have smoke detectors at home- yes Patient does wear sunscreen or protective clothing  when in direct sunlight - yes Patient does wear seat belt when in a moving vehicle - yes Patient drives- yes Adequate lighting in walkways free from debris- yes Grab bars and handrails used as appropriate- yes Ambulates with no assistive device Cell phone on person when ambulating outside of the home- yes  Social: Alcohol intake - yes      Smoking history- former    Smokers in home? none Illicit drug use? none  Depression: PHQ 2 &9 complete. See screening below. Denies irritability, anhedonia, sadness/tearfullness.  Stable.   Falls: See screening below.    Medication: Taking as directed and without issues.   Covid-19: Precautions and sickness symptoms discussed. Wears mask, social distancing, hand hygiene as appropriate.   Activities of Daily Living Patient denies needing assistance with: household chores, feeding themselves, getting from bed to chair, getting to the toilet, bathing/showering, dressing, managing money, or preparing meals.   Memory: Patient is alert. Patient denies difficulty focusing or concentrating. Correctly identified the president of the [place], season and recall 2/3. Patient likes to color and play word games for brain stimulation.  BMI- discussed the importance of a healthy diet, water intake and the benefits of aerobic exercise.  Educational material provided.  Physical activity-  Walking,  no routine  Diet: regular Water: good intake Caffeine:  Ensure/Protein supplement: yes  Other Providers Patient Care Team: [name], MD as PCP - General (Internal Medicine) [name], MD (Internal Medicine) Bary Castilla Forest Gleason, MD (General Surgery)  Exercise Activities and Dietary recommendations Current Exercise Habits: Home exercise routine, Type of exercise: walking;stretching, Intensity: Mild  Goals    . Lose weight     Exercise 150 minutes per week (5 days, 30 minutes) Stay hydrated Low carb diet       Fall Risk Fall Risk  03/28/2019 02/05/2018 09/24/2017 09/24/2017  Falls in the past year? 0 No No No  Number falls in past yr: 0 - - -   Timed Get Up and Go performed: no, virtual visit  Depression Screen PHQ 2/9 Scores 03/28/2019 01/26/2019 02/05/2018 01/29/2016  PHQ - 2 Score 0 0 0 3  PHQ- 9 Score - 4 - 16     Cognitive Function MMSE - Mini Mental State Exam 02/05/2018  Orientation to time 5  Orientation to Place 5  Registration 3  Attention/ Calculation 5  Recall 3  Language- name 2 objects 2  Language- repeat 1  Language- follow 3 step command 3  Language- read & follow direction 1  Write a sentence 1  Copy design 1  Total score 30     6CIT Screen 03/28/2019  What Year? 0 points  What month? 0 points  What time? 0 points  Count back from 20 0 points  Months in reverse 0 points    Immunization History  Administered Date(s) Administered  . Influenza, High Dose Seasonal PF 03/27/2015  . Pneumococcal Conjugate-13 09/24/2017  . Tdap 10/07/2012   Screening Tests Health Maintenance  Topic Date Due  . FOOT EXAM  04/17/1961  . PNA vac Low Risk Adult (2 of 2 - PPSV23) 09/25/2018  . MAMMOGRAM  12/02/2018  . URINE MICROALBUMIN  03/27/2019  . INFLUENZA VACCINE  09/07/2019 (Originally 01/08/2019)  . OPHTHALMOLOGY EXAM  06/24/2019  . HEMOGLOBIN A1C  08/09/2019  . TETANUS/TDAP  10/08/2022  . COLONOSCOPY  03/19/2026  . DEXA SCAN  Completed  .  Hepatitis C Screening  Completed       Plan:   Keep  all routine maintenance appointments.   Follow up with your doctor today 10:00  Medicare Attestation I have personally reviewed: The patient's medical and social history Their use of alcohol, tobacco or illicit drugs Their current medications and supplements The patient's functional ability including ADLs,fall risks, home safety risks, cognitive, and hearing and visual impairment Diet and physical activities Evidence for depression   In addition, I have reviewed and discussed with patient certain preventive protocols, quality metrics, and best practice recommendations. A written personalized care plan for preventive services as well as general preventive health recommendations were provided to patient via mail.     Varney Biles, LPN  [id number]

## 2019-03-28 NOTE — Assessment & Plan Note (Addendum)
I have addressed  BMI and recommended a low glycemic index diet utilizing smaller more frequent meals to increase metabolism.  I have also recommended that patient start exercising with a goal of 30 minutes of aerobic exercise (walking advised)  a minimum of 5 days per week.

## 2019-03-28 NOTE — Assessment & Plan Note (Signed)
Managed with prn use of alprazolam The risks and benefits of benzodiazepine use were reviewedm with patient today including excessive sedation leading to respiratory depression,  impaired thinking/driving, and addiction.  Patient was advised to avoid concurrent use with alcohol, to use medication only as needed and not to share with others  .

## 2019-03-28 NOTE — Progress Notes (Signed)
Virtual Visit via [email or web address]  This visit type was conducted due to national recommendations for restrictions regarding the COVID-19 pandemic (e.g. social distancing).  This format is felt to be most appropriate for this patient at this time.  All issues noted in this document were discussed and addressed.  No physical exam was performed (except for noted visual exam findings with Video Visits).   I connected with@ on 03/28/19 at 10:00 AM EDT by a video enabled telemedicine applicationand verified that I am speaking with the correct person using two identifiers. Location patient: home Location provider: work or home office Persons participating in the virtual visit: patient, provider  I discussed the limitations, risks, security and privacy concerns of performing an evaluation and management service by telephone and the availability of in person appointments. I also discussed with the patient that there may be a patient responsible charge related to this service. The patient expressed understanding and agreed to proceed. Reason for visit: follow up on IPG, dyslipidemia.   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   The patient has no signs or symptoms of COVID 19 infection (fever, cough, sore throat  or shortness of breath beyond what is typical for patient).  Patient denies contact with other persons with the above mentioned symptoms or with anyone confirmed to have COVID 19 .  She has been minimizing her contact with the public and using a mask and hand sanitizer when she comes into any contact with the public. 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  Patient voices awareness  of the foods he/she needs to avoid,  And follows a low GI diet about 50% of the time.    Denies hypoglycemic symptoms.   Hyperlipidemia:  Taking fenofibrate  Daily for the last 5 years.  No prior statin  intolerance. 10 Yr risk of CAD using FRC discussed   ROS: See pertinent positives and negatives per HPI.  Past Medical History:  Diagnosis Date  . Anxiety   . Goiter   . Hyperlipidemia    TGs  . Seasonal allergies     Past Surgical History:  Procedure Laterality Date  . ABDOMINAL HYSTERECTOMY     supracervical   . BILATERAL OOPHORECTOMY  2001   BENIGN TUMOR  . BREAST BIOPSY Left 1985   cyst   . CATARACT EXTRACTION Right   . COLONOSCOPY  08/17/2009  . COLONOSCOPY WITH PROPOFOL N/A 03/19/2016   Procedure: COLONOSCOPY WITH PROPOFOL;  Surgeon: Robert Bellow, MD;  Location: [facility] ENDOSCOPY;  Service: Endoscopy;  Laterality: N/A;  . CORNEAL TRANSPLANT Right     Family History  Problem Relation Age of Onset  . Cancer Mother        OVARIAN  . Stroke Father   . Breast cancer Sister 58  . Thyroid disease Other        MULTIPLE FAMILY MEMBERS  . Atrial fibrillation Brother     SOCIAL HX:  reports that she quit smoking about 20 years ago. Her smoking use included cigarettes. She quit after 24.00 years of use. She has never used smokeless tobacco. She reports current alcohol use. She reports that she does not use drugs.   Current Outpatient Medications:  .  ALPRAZolam (XANAX) 0.5 MG tablet, TAKE 1 TABLET BY MOUTH AT BEDTIME AS NEEDED FOR ANXIETY, Disp: 30 tablet, Rfl: 5 .  fexofenadine-pseudoephedrine (ALLEGRA-D  24) 180-240 MG per 24 hr tablet, Take 1 tablet by mouth daily., Disp: , Rfl:  .  fluticasone (FLONASE) 50 MCG/ACT nasal spray, Place 2 sprays into the nose daily., Disp: , Rfl:  .  levothyroxine (SYNTHROID) 75 MCG tablet, TAKE 1 TABLET EVERY DAY ON EMPTY STOMACHWITH A GLASS OF WATER AT LEAST 30-60 MINBEFORE BREAKFAST, Disp: 90 tablet, Rfl: 0 .  prednisoLONE acetate (PRED FORTE) 1 % ophthalmic suspension, Place 1 drop into the right eye 4 (four) times daily., Disp: , Rfl:  .  traZODone (DESYREL) 50 MG tablet, Take 1 tablet (50 mg total) by mouth at bedtime as needed for  sleep., Disp: 90 tablet, Rfl: 1 .  rosuvastatin (CRESTOR) 10 MG tablet, Take 1 tablet (10 mg total) by mouth daily., Disp: 30 tablet, Rfl: 5  EXAM:  VITALS per patient if applicable:  GENERAL: alert, oriented, appears well and in no acute distress  HEENT: atraumatic, conjunttiva clear, no obvious abnormalities on inspection of external nose and ears  NECK: normal movements of the head and neck  LUNGS: on inspection no signs of respiratory distress, breathing rate appears normal, no obvious gross SOB, gasping or wheezing  CV: no obvious cyanosis  MS: moves all visible extremities without noticeable abnormality  PSYCH/NEURO: pleasant and cooperative, no obvious depression or anxiety, speech and thought processing grossly intact  ASSESSMENT AND PLAN:  Discussed the following assessment and plan:  Dyslipidemia (high LDL; low HDL)  Acquired hypothyroidism  Impaired fasting glucose  Obesity (BMI 30.0-34.9)  Anxiety as acute reaction to gross stress  Dyslipidemia (high LDL; low HDL) Changing to rosuvastatin for LDL of 112 and FRC risk > 15%.  Stopping fenofibrate  Lab Results  Component Value Date   LDLCALC 124 (H) 03/26/2018   Lab Results  Component Value Date   CHOL 212 (H) 02/09/2019   HDL 45.20 02/09/2019   LDLCALC 124 (H) 03/26/2018   LDLDIRECT 112.0 02/09/2019   TRIG 211.0 (H) 02/09/2019   CHOLHDL 5 02/09/2019     Hypothyroidism Thyroid function is WNL on current dose.  No current changes needed.   Lab Results  Component Value Date   TSH 3.21 02/09/2019     Impaired fasting glucose  a1c remains below the threshhold for prediabetes.    Lab Results  Component Value Date   HGBA1C 5.6 02/09/2019     Obesity (BMI 30.0-34.9) I have addressed  BMI and recommended a low glycemic index diet utilizing smaller more frequent meals to increase metabolism.  I have also recommended that patient start exercising with a goal of 30 minutes of aerobic exercise  (walking advised)  a minimum of 5 days per week.   Anxiety as acute reaction to gross stress Managed with prn use of alprazolam The risks and benefits of benzodiazepine use were reviewedm with patient today including excessive sedation leading to respiratory depression,  impaired thinking/driving, and addiction.  Patient was advised to avoid concurrent use with alcohol, to use medication only as needed and not to share with others  .     I discussed the assessment and treatment plan with the patient. The patient was provided an opportunity to ask questions and all were answered. The patient agreed with the plan and demonstrated an understanding of the instructions.   The patient was advised to call back or seek an in-person evaluation if the symptoms worsen or if the condition fails to improve as anticipated.  I provided  25 minutes of non-face-to-face time during this encounter reviewing  patient's current problems and post surgeries.  Providing counseling on the above mentioned problems , and coordination  of care .    [name], MD

## 2019-04-18 ENCOUNTER — Ambulatory Visit
Admission: RE | Admit: 2019-04-18 | Discharge: 2019-04-18 | Disposition: A | Discharge status: Home / Self Care | Payer: PPO | Source: Ambulatory Visit | Attending: Internal Medicine | Admitting: Internal Medicine

## 2019-04-18 DIAGNOSIS — Z1231 Encounter for screening mammogram for malignant neoplasm of breast: Secondary | ICD-10-CM | POA: Insufficient documentation

## 2019-06-09 ENCOUNTER — Other Ambulatory Visit: Payer: Self-pay | Admitting: Internal Medicine

## 2019-07-21 ENCOUNTER — Other Ambulatory Visit: Payer: Self-pay

## 2019-07-21 ENCOUNTER — Encounter: Payer: Self-pay | Admitting: Internal Medicine

## 2019-07-21 ENCOUNTER — Ambulatory Visit (INDEPENDENT_AMBULATORY_CARE_PROVIDER_SITE_OTHER): Payer: PPO | Admitting: Internal Medicine

## 2019-07-21 VITALS — Ht 61.0 in | Wt 162.0 lb

## 2019-07-21 DIAGNOSIS — F430 Acute stress reaction: Secondary | ICD-10-CM

## 2019-07-21 DIAGNOSIS — F411 Generalized anxiety disorder: Secondary | ICD-10-CM

## 2019-07-21 DIAGNOSIS — R7301 Impaired fasting glucose: Secondary | ICD-10-CM | POA: Diagnosis not present

## 2019-07-21 DIAGNOSIS — E039 Hypothyroidism, unspecified: Secondary | ICD-10-CM | POA: Diagnosis not present

## 2019-07-21 DIAGNOSIS — Z23 Encounter for immunization: Secondary | ICD-10-CM | POA: Diagnosis not present

## 2019-07-21 DIAGNOSIS — E669 Obesity, unspecified: Secondary | ICD-10-CM | POA: Diagnosis not present

## 2019-07-21 DIAGNOSIS — E785 Hyperlipidemia, unspecified: Secondary | ICD-10-CM

## 2019-07-21 NOTE — Assessment & Plan Note (Signed)
Managed with  rosuvastatin since repeat LDL was  112 and FRC risk > 15%.   Repeat labs due in April   Lab Results  Component Value Date   LDLCALC 124 (H) 03/26/2018   Lab Results  Component Value Date   CHOL 212 (H) 02/09/2019   HDL 45.20 02/09/2019   LDLCALC 124 (H) 03/26/2018   LDLDIRECT 112.0 02/09/2019   TRIG 211.0 (H) 02/09/2019   CHOLHDL 5 02/09/2019

## 2019-07-21 NOTE — Assessment & Plan Note (Signed)
Symptoms currently controlled.  No changes to medications

## 2019-07-21 NOTE — Progress Notes (Signed)
Virtual Visit via [email or web address]  This visit type was conducted due to national recommendations for restrictions regarding the COVID-19 pandemic (e.g. social distancing).  This format is felt to be most appropriate for this patient at this time.  All issues noted in this document were discussed and addressed.  No physical exam was performed (except for noted visual exam findings with Video Visits).   I connected with@ on 07/21/19 at  8:00 AM EST by a video enabled telemedicine application and verified that I am speaking with the correct person using two identifiers. Location patient: home Location provider: work or home office Persons participating in the virtual visit: patient, provider  I discussed the limitations, risks, security and privacy concerns of performing an evaluation and management service by telephone and the availability of in person appointments. I also discussed with the patient that there may be a patient responsible charge related to this service. The patient expressed understanding and agreed to proceed.  Reason for visit:   HPI:  69 YR OLD female wit hpothyroid, hyperlipidemia, obesity and depression presents for follow up.    The patient has no signs or symptoms of COVID 19 infection (fever, cough, sore throat  or shortness of breath beyond what is typical for patient).  Patient denies contact with other persons with the above mentioned symptoms or with anyone confirmed to have COVID 19  She denies any negative effect on her mood disorder resulting from COVID restrictions.  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  She has had some weight fluctuations  And is unable to get her BMI < 30 and is frustrated,  She is not exercising,  Does not eat a lot of starches,  Averages 2 meals per day.  Planning to start a weight loss diet with a friend.    Depression/anxiety :  She worries about her daughter who works for [employer] clinic and has a complacent  attitude about the pandemic and has not opted to have the vaccine. Patient is on 2 waiting lists for the vaccine.  Tolerating medications without side effects      ROS: See pertinent positives and negatives per HPI.  Past Medical History:  Diagnosis Date  . Anxiety   . Goiter   . Hyperlipidemia    TGs  . Seasonal allergies     Past Surgical History:  Procedure Laterality Date  . ABDOMINAL HYSTERECTOMY     supracervical   . BILATERAL OOPHORECTOMY  2001   BENIGN TUMOR  . BREAST BIOPSY Left 1985   cyst   . CATARACT EXTRACTION Right   . COLONOSCOPY  08/17/2009  . COLONOSCOPY WITH PROPOFOL N/A 03/19/2016   Procedure: COLONOSCOPY WITH PROPOFOL;  Surgeon: Robert Bellow, MD;  Location: [facility] ENDOSCOPY;  Service: Endoscopy;  Laterality: N/A;  . CORNEAL TRANSPLANT Right     Family History  Problem Relation Age of Onset  . Cancer Mother        OVARIAN  . Stroke Father   . Breast cancer Sister 47  . Thyroid disease Other        MULTIPLE FAMILY MEMBERS  . Atrial fibrillation Brother     SOCIAL HX:  reports that she quit smoking about 21 years ago. Her smoking use included cigarettes. She quit after 24.00 years of use. She has never used smokeless tobacco. She reports current alcohol use. She reports that she does not use drugs.   Current Outpatient Medications:  .  ALPRAZolam Duanne Moron)  0.5 MG tablet, TAKE 1 TABLET BY MOUTH AT BEDTIME AS NEEDED FOR ANXIETY, Disp: 30 tablet, Rfl: 5 .  fexofenadine-pseudoephedrine (ALLEGRA-D 24) 180-240 MG per 24 hr tablet, Take 1 tablet by mouth daily., Disp: , Rfl:  .  fluticasone (FLONASE) 50 MCG/ACT nasal spray, Place 2 sprays into the nose daily., Disp: , Rfl:  .  levothyroxine (SYNTHROID) 75 MCG tablet, TAKE 1 TABLET EVERY DAY ON EMPTY STOMACHWITH A GLASS OF WATER AT LEAST 30-60 MINBEFORE BREAKFAST, Disp: 90 tablet, Rfl: 0 .  prednisoLONE acetate (PRED FORTE) 1 % ophthalmic suspension, Place 1 drop into the right eye 4 (four) times daily.,  Disp: , Rfl:  .  rosuvastatin (CRESTOR) 10 MG tablet, Take 1 tablet (10 mg total) by mouth daily., Disp: 30 tablet, Rfl: 5 .  traZODone (DESYREL) 50 MG tablet, TAKE ONE TABLET AT BEDTIME AS NEEDED FORSLEEP, Disp: 90 tablet, Rfl: 1  EXAM:  VITALS per patient if applicable:  GENERAL: alert, oriented, appears well and in no acute distress  HEENT: atraumatic, conjunttiva clear, no obvious abnormalities on inspection of external nose and ears  NECK: normal movements of the head and neck  LUNGS: on inspection no signs of respiratory distress, breathing rate appears normal, no obvious gross SOB, gasping or wheezing  CV: no obvious cyanosis  MS: moves all visible extremities without noticeable abnormality  PSYCH/NEURO: pleasant and cooperative, no obvious depression or anxiety, speech and thought processing grossly intact  ASSESSMENT AND PLAN:  Discussed the following assessment and plan:  Impaired fasting glucose - Plan: Comprehensive metabolic panel, Hemoglobin A1c  Obesity (BMI 30.0-34.9)  Dyslipidemia (high LDL; low HDL) - Plan: Lipid panel  Acquired hypothyroidism - Plan: TSH  Anxiety as acute reaction to gross stress  Hypothyroidism Thyroid function is WNL on current dose.  No current changes needed.  Will recheck in April and adjust dose to keep TSH around 1.0  Lab Results  Component Value Date   TSH 3.21 02/09/2019     Dyslipidemia (high LDL; low HDL) Managed with  rosuvastatin since repeat LDL was  112 and FRC risk > 15%.   Repeat labs due in April   Lab Results  Component Value Date   LDLCALC 124 (H) 03/26/2018   Lab Results  Component Value Date   CHOL 212 (H) 02/09/2019   HDL 45.20 02/09/2019   LDLCALC 124 (H) 03/26/2018   LDLDIRECT 112.0 02/09/2019   TRIG 211.0 (H) 02/09/2019   CHOLHDL 5 02/09/2019     Impaired fasting glucose  a1c remains below the threshhold for prediabetes.   She is following a low glycemic index diet but not exercising.   Lab  Results  Component Value Date   HGBA1C 5.6 02/09/2019     Anxiety as acute reaction to gross stress Symptoms currently controlled.  No changes to medications  Obesity (BMI 30.0-34.9) I have addressed  BMI  that patient start exercising with a goal of 30 minutes of aerobic exercise (walking advised)  a minimum of 5 days per week.     I discussed the assessment and treatment plan with the patient. The patient was provided an opportunity to ask questions and all were answered. The patient agreed with the plan and demonstrated an understanding of the instructions.   The patient was advised to call back or seek an in-person evaluation if the symptoms worsen or if the condition fails to improve as anticipated.  I provided 30 minutes of non-face-to-face time during this encounter reviewing patient's current problems and  post surgeries.  Providing counseling on the above mentioned problems , and coordination  of care .  [name], MD

## 2019-07-21 NOTE — Assessment & Plan Note (Signed)
I have addressed  BMI  that patient start exercising with a goal of 30 minutes of aerobic exercise (walking advised)  a minimum of 5 days per week.

## 2019-07-21 NOTE — Patient Instructions (Signed)
We should repeat your labs in early April  I encourage you to start walking for 30 minutes daily

## 2019-07-21 NOTE — Assessment & Plan Note (Signed)
Thyroid function is WNL on current dose.  No current changes needed.  Will recheck in April and adjust dose to keep TSH around 1.0  Lab Results  Component Value Date   TSH 3.21 02/09/2019

## 2019-07-21 NOTE — Assessment & Plan Note (Signed)
a1c remains below the threshhold for prediabetes.   She is following a low glycemic index diet but not exercising.   Lab Results  Component Value Date   HGBA1C 5.6 02/09/2019

## 2019-09-19 ENCOUNTER — Other Ambulatory Visit: Payer: Self-pay | Admitting: Internal Medicine

## 2019-09-22 DIAGNOSIS — H2512 Age-related nuclear cataract, left eye: Secondary | ICD-10-CM | POA: Diagnosis not present

## 2019-09-22 DIAGNOSIS — H43811 Vitreous degeneration, right eye: Secondary | ICD-10-CM | POA: Diagnosis not present

## 2019-09-22 DIAGNOSIS — H04123 Dry eye syndrome of bilateral lacrimal glands: Secondary | ICD-10-CM | POA: Diagnosis not present

## 2019-09-22 DIAGNOSIS — Z947 Corneal transplant status: Secondary | ICD-10-CM | POA: Diagnosis not present

## 2019-09-22 DIAGNOSIS — H40013 Open angle with borderline findings, low risk, bilateral: Secondary | ICD-10-CM | POA: Diagnosis not present

## 2019-10-04 ENCOUNTER — Other Ambulatory Visit: Payer: Self-pay

## 2019-10-04 ENCOUNTER — Other Ambulatory Visit (INDEPENDENT_AMBULATORY_CARE_PROVIDER_SITE_OTHER): Payer: PPO

## 2019-10-04 DIAGNOSIS — E039 Hypothyroidism, unspecified: Secondary | ICD-10-CM | POA: Diagnosis not present

## 2019-10-04 DIAGNOSIS — R7301 Impaired fasting glucose: Secondary | ICD-10-CM

## 2019-10-04 DIAGNOSIS — E785 Hyperlipidemia, unspecified: Secondary | ICD-10-CM | POA: Diagnosis not present

## 2019-10-04 LAB — LIPID PANEL
Cholesterol: 106 mg/dL (ref 0–200)
HDL: 33.9 mg/dL — ABNORMAL LOW (ref >39.00)
LDL Cholesterol: 48 mg/dL (ref 0–99)
NonHDL: 72.08
Total CHOL/HDL Ratio: 3
Triglycerides: 121 mg/dL (ref 0.0–149.0)
VLDL: 24.2 mg/dL (ref 0.0–40.0)

## 2019-10-04 LAB — COMPREHENSIVE METABOLIC PANEL
ALT: 22 U/L (ref 0–35)
AST: 24 U/L (ref 0–37)
Albumin: 4.3 g/dL (ref 3.5–5.2)
Alkaline Phosphatase: 47 U/L (ref 39–117)
BUN: 25 mg/dL — ABNORMAL HIGH (ref 6–23)
CO2: 30 mEq/L (ref 19–32)
Calcium: 9.6 mg/dL (ref 8.4–10.5)
Chloride: 105 mEq/L (ref 96–112)
Creatinine, Ser: 0.82 mg/dL (ref 0.40–1.20)
GFR: 69.23 mL/min (ref >60.00)
Glucose, Bld: 101 mg/dL — ABNORMAL HIGH (ref 70–99)
Potassium: 3.9 mEq/L (ref 3.5–5.1)
Sodium: 141 mEq/L (ref 135–145)
Total Bilirubin: 0.5 mg/dL (ref 0.2–1.2)
Total Protein: 6.9 g/dL (ref 6.0–8.3)

## 2019-10-04 LAB — HEMOGLOBIN A1C: Hgb A1c MFr Bld: 5.5 % (ref 4.6–6.5)

## 2019-10-04 LAB — TSH: TSH: 3.24 u[IU]/mL (ref 0.35–4.50)

## 2019-10-17 ENCOUNTER — Other Ambulatory Visit: Payer: Self-pay | Admitting: Internal Medicine

## 2019-12-19 ENCOUNTER — Other Ambulatory Visit: Payer: Self-pay | Admitting: Internal Medicine

## 2019-12-28 ENCOUNTER — Other Ambulatory Visit: Payer: Self-pay | Admitting: Internal Medicine

## 2020-02-02 ENCOUNTER — Other Ambulatory Visit: Payer: Self-pay

## 2020-02-02 ENCOUNTER — Ambulatory Visit (INDEPENDENT_AMBULATORY_CARE_PROVIDER_SITE_OTHER): Payer: PPO | Admitting: Internal Medicine

## 2020-02-02 ENCOUNTER — Encounter: Payer: Self-pay | Admitting: Internal Medicine

## 2020-02-02 VITALS — BP 118/72 | HR 64 | Temp 97.4°F | Resp 14 | Ht 61.0 in | Wt 142.0 lb

## 2020-02-02 DIAGNOSIS — F324 Major depressive disorder, single episode, in partial remission: Secondary | ICD-10-CM

## 2020-02-02 DIAGNOSIS — Z1231 Encounter for screening mammogram for malignant neoplasm of breast: Secondary | ICD-10-CM

## 2020-02-02 DIAGNOSIS — E039 Hypothyroidism, unspecified: Secondary | ICD-10-CM | POA: Diagnosis not present

## 2020-02-02 DIAGNOSIS — Z0000 Encounter for general adult medical examination without abnormal findings: Secondary | ICD-10-CM

## 2020-02-02 DIAGNOSIS — E663 Overweight: Secondary | ICD-10-CM

## 2020-02-02 DIAGNOSIS — E785 Hyperlipidemia, unspecified: Secondary | ICD-10-CM | POA: Diagnosis not present

## 2020-02-02 DIAGNOSIS — R7301 Impaired fasting glucose: Secondary | ICD-10-CM | POA: Diagnosis not present

## 2020-02-02 LAB — COMPREHENSIVE METABOLIC PANEL
ALT: 16 U/L (ref 0–35)
AST: 20 U/L (ref 0–37)
Albumin: 4.3 g/dL (ref 3.5–5.2)
Alkaline Phosphatase: 43 U/L (ref 39–117)
BUN: 25 mg/dL — ABNORMAL HIGH (ref 6–23)
CO2: 29 mEq/L (ref 19–32)
Calcium: 9.7 mg/dL (ref 8.4–10.5)
Chloride: 104 mEq/L (ref 96–112)
Creatinine, Ser: 0.8 mg/dL (ref 0.40–1.20)
GFR: 71.16 mL/min (ref >60.00)
Glucose, Bld: 103 mg/dL — ABNORMAL HIGH (ref 70–99)
Potassium: 3.9 mEq/L (ref 3.5–5.1)
Sodium: 140 mEq/L (ref 135–145)
Total Bilirubin: 0.7 mg/dL (ref 0.2–1.2)
Total Protein: 7.1 g/dL (ref 6.0–8.3)

## 2020-02-02 LAB — LIPID PANEL
Cholesterol: 124 mg/dL (ref 0–200)
HDL: 42.3 mg/dL (ref >39.00)
LDL Cholesterol: 60 mg/dL (ref 0–99)
NonHDL: 81.5
Total CHOL/HDL Ratio: 3
Triglycerides: 110 mg/dL (ref 0.0–149.0)
VLDL: 22 mg/dL (ref 0.0–40.0)

## 2020-02-02 LAB — TSH: TSH: 2.11 u[IU]/mL (ref 0.35–4.50)

## 2020-02-02 MED ORDER — ZOSTER VAC RECOMB ADJUVANTED 50 MCG/0.5ML IM SUSR: 0.5000 mL | Freq: Once | INTRAMUSCULAR | 1 refills | Status: AC

## 2020-02-02 NOTE — Progress Notes (Signed)
Patient ID: Lorraine Mills, female    DOB: [birth year]  Age: 69 y.o. MRN: [id number]  The patient is here for annual preventive  examination and management of other chronic and acute problems.   This visit occurred during the SARS-CoV-2 public health emergency.  Safety protocols were in place, including screening questions prior to the visit, additional usage of staff PPE, and extensive cleaning of exam room while observing appropriate contact time as indicated for disinfecting solutions.    Patient has received both doses of the available COVID 19 vaccine without complications.  Patient continues to mask when outside of the home except when walking in yard or at safe distances from others .  Patient denies any change in mood or development of unhealthy behaviors resuting from the pandemic's restriction of activities and socialization.     The risk factors are reflected in the social history.  The roster of all physicians providing medical care to patient - is listed in the Snapshot section of the chart.  Activities of daily living:  The patient is 100% independent in all ADLs: dressing, toileting, feeding as well as independent mobility  Home safety : The patient has smoke detectors in the home. They wear seatbelts.  There are no firearms at home. There is no violence in the home.   There is no risks for hepatitis, STDs or HIV. There is no   history of blood transfusion. They have no travel history to infectious disease endemic areas of the world.  The patient has seen their dentist in the last six month. They have seen their eye doctor in the last year. They admit to slight hearing difficulty with regard to whispered voices and some television programs.  They have deferred audiologic testing in the last year.  They do not  have excessive sun exposure. Discussed the need for sun protection: hats, long sleeves and use of sunscreen if there is significant sun exposure.   Diet: the importance of a  healthy diet is discussed. They do have a healthy diet.  The benefits of regular aerobic exercise were discussed. She walks 4 times per week ,  20 minutes.   Depression screen: there are no signs or vegative symptoms of depression- irritability, change in appetite, anhedonia, sadness/tearfullness.  Cognitive assessment: the patient manages all their financial and personal affairs and is actively engaged. They could relate day,date,year and events; recalled 2/3 objects at 3 minutes; performed clock-face test normally.  The following portions of the patient's history were reviewed and updated as appropriate: allergies, current medications, past family history, past medical history,  past surgical history, past social history  and problem list.  Visual acuity was not assessed per patient preference since she has regular follow up with her ophthalmologist. Hearing and body mass index were assessed and reviewed.   During the course of the visit the patient was educated and counseled about appropriate screening and preventive services including : fall prevention , diabetes screening, nutrition counseling, colorectal cancer screening, and recommended immunizations.    CC: The primary encounter diagnosis was Encounter for preventive health examination. Diagnoses of Acquired hypothyroidism, Dyslipidemia (high LDL; low HDL), Breast cancer screening by mammogram, Major depressive disorder with single episode, in partial remission ([place]), Impaired fasting glucose, and Overweight (BMI 25.0-29.9) were also pertinent to this visit.  1) She has achieved a 30 lb wt loss on Optavia Diet.  She feels great . Planning to transition off of the Wade products   History Lorraine Mills has a past  medical history of Anxiety, Goiter, Hyperlipidemia, and Seasonal allergies.   She has a past surgical history that includes Bilateral oophorectomy (2001); Abdominal hysterectomy; Corneal transplant (Right); Cataract extraction (Right);  Colonoscopy (08/17/2009); Breast biopsy (Left, 1985); and Colonoscopy with propofol (N/A, 03/19/2016).   Her family history includes Atrial fibrillation in her brother; Breast cancer (age of onset: 24) in her sister; Cancer in her mother; Stroke in her father; Thyroid disease in an other family member.She reports that she quit smoking about 21 years ago. Her smoking use included cigarettes. She quit after 24.00 years of use. She has never used smokeless tobacco. She reports current alcohol use. She reports that she does not use drugs.  Outpatient Medications Prior to Visit  Medication Sig Dispense Refill  . fexofenadine-pseudoephedrine (ALLEGRA-D 24) 180-240 MG per 24 hr tablet Take 1 tablet by mouth daily.    . fluticasone (FLONASE) 50 MCG/ACT nasal spray Place 2 sprays into the nose daily.    Marland Kitchen levothyroxine (SYNTHROID) 75 MCG tablet TAKE 1 TABLET EVERY DAY ON EMPTY STOMACHWITH A GLASS OF WATER AT LEAST 30-60 MINBEFORE BREAKFAST 90 tablet 0  . prednisoLONE acetate (PRED FORTE) 1 % ophthalmic suspension Place 1 drop into the right eye 4 (four) times daily.    . rosuvastatin (CRESTOR) 10 MG tablet TAKE ONE TABLET EVERY DAY 30 tablet 5  . traZODone (DESYREL) 50 MG tablet TAKE ONE TABLET AT BEDTIME AS NEEDED FORSLEEP 90 tablet 1  . ALPRAZolam (XANAX) 0.5 MG tablet TAKE 1 TABLET BY MOUTH AT BEDTIME AS NEEDED FOR ANXIETY (Patient not taking: Reported on 02/02/2020) 30 tablet 5   No facility-administered medications prior to visit.    Review of Systems   Patient denies headache, fevers, malaise, unintentional weight loss, skin rash, eye pain, sinus congestion and sinus pain, sore throat, dysphagia,  hemoptysis , cough, dyspnea, wheezing, chest pain, palpitations, orthopnea, edema, abdominal pain, nausea, melena, diarrhea, constipation, flank pain, dysuria, hematuria, urinary  Frequency, nocturia, numbness, tingling, seizures,  Focal weakness, Loss of consciousness,  Tremor, insomnia, depression,  anxiety, and suicidal ideation.      Objective:  BP 118/72 (BP Location: Left Arm, Patient Position: Sitting, Cuff Size: Normal)   Pulse 64   Temp (!) 97.4 F (36.3 C) (Oral)   Resp 14   Ht 5\' 1"  (1.549 m)   Wt 142 lb (64.4 kg)   SpO2 98%   BMI 26.83 kg/m   Physical Exam  General appearance: alert, cooperative and appears stated age Head: Normocephalic, without obvious abnormality, atraumatic Eyes: conjunctivae/corneas clear. PERRL, EOM's intact. Fundi benign. Ears: normal TM's and external ear canals both ears Nose: Nares normal. Septum midline. Mucosa normal. No drainage or sinus tenderness. Throat: lips, mucosa, and tongue normal; teeth and gums normal Neck: no adenopathy, no carotid bruit, no JVD, supple, symmetrical, trachea midline and thyroid not enlarged, symmetric, no tenderness/mass/nodules Lungs: clear to auscultation bilaterally Breasts: normal appearance, no masses or tenderness Heart: regular rate and rhythm, S1, S2 normal, no murmur, click, rub or gallop Abdomen: soft, non-tender; bowel sounds normal; no masses,  no organomegaly Extremities: extremities normal, atraumatic, no cyanosis or edema Pulses: 2+ and symmetric Skin: Skin color, texture, turgor normal. No rashes or lesions Neurologic: Alert and oriented X 3, normal strength and tone. Normal symmetric reflexes. Normal coordination and gait.    Assessment & Plan:   Problem List Items Addressed This Visit      Unprioritized   Depression    Resolved.   She is no longer using  zoloft or alprazolam   Insomnia managed with trazodone.         Dyslipidemia (high LDL; low HDL)    Managed with  rosuvastatin for FRC risk > 15%.    LDL is now  <70  Lab Results  Component Value Date   LDLCALC 60 02/02/2020   Lab Results  Component Value Date   CHOL 124 02/02/2020   HDL 42.30 02/02/2020   LDLCALC 60 02/02/2020   LDLDIRECT 112.0 02/09/2019   TRIG 110.0 02/02/2020   CHOLHDL 3 02/02/2020          Relevant Orders   Lipid panel (Completed)   Encounter for preventive health examination - Primary    age appropriate education and counseling updated, referrals for preventative services and immunizations addressed, dietary and smoking counseling addressed, most recent labs reviewed.  I have personally reviewed and have noted:  1) the patient's medical and social history 2) The pt's use of alcohol, tobacco, and illicit drugs 3) The patient's current medications and supplements 4) Functional ability including ADL's, fall risk, home safety risk, hearing and visual impairment 5) Diet and physical activities 6) Evidence for depression or mood disorder 7) The patient's height, weight, and BMI have been recorded in the chart  I have made referrals, and provided counseling and education based on review of the above      Hypothyroidism    Thyroid function is WNL on current dose.  No current changes needed.    Lab Results  Component Value Date   TSH 2.11 02/02/2020         Relevant Orders   Comprehensive metabolic panel (Completed)   TSH (Completed)   Impaired fasting glucose     a1c remains below the threshhold for prediabetes.   She is following a low glycemic index diet but not exercising.   Lab Results  Component Value Date   HGBA1C 5.5 10/04/2019         Overweight (BMI 25.0-29.9)    I have congratulated her in reduction of   BMI and encouraged  Continued weight loss with goal of 10% of body weightover the next 6 months using a low glycemic index diet and regular exercise a minimum of 5 days per week.         Other Visit Diagnoses    Breast cancer screening by mammogram       Relevant Orders   MM 3D SCREEN BREAST BILATERAL      I am having Lorraine Mills start on Zoster Vaccine Adjuvanted. I am also having her maintain her fexofenadine-pseudoephedrine, fluticasone, prednisoLONE acetate, ALPRAZolam, rosuvastatin, levothyroxine, and traZODone.  Meds ordered this  encounter  Medications  . Zoster Vaccine Adjuvanted [facility]) injection    Sig: Inject 0.5 mLs into the muscle once for 1 dose.    Dispense:  1 each    Refill:  1    There are no discontinued medications.  Follow-up: Return in about 6 months (around 08/04/2020).   [name], MD

## 2020-02-02 NOTE — Patient Instructions (Addendum)
Your annual mammogram has been ordered and is due in November .  You are encouraged (required) to call to make your appointment at [facility]  DEXA scan due in 3 yrs  Colonoscopy in 1 year   The ShingRx vaccine is now available in local pharmacies and is much more protective than the old one  Zostavax  (it is about 97%  Effective in preventing shingles). .   It is therefore ADVISED for all interested adults over 50 to prevent shingles so I have printed you a prescription for it.  (it requires a 2nd dose 2 to 6 months after the first one) .  It will cause you to have flu  like symptoms for 2 days If your pharmacy is not available to give it to you,  The [facility] pharmacy will give it to you.  They are located on the ground floor of the [facility]   You cannot have it on the same day as your high dose flu vaccine (you should  separate by 1 month from covid booster as well )  You are also eligible for the Pneuovax vaccine and can get this by scheduling  an RN visit    Health Maintenance After Age 57 After age 41, you are at a higher risk for certain long-term diseases and infections as well as injuries from falls. Falls are a major cause of broken bones and head injuries in people who are older than age 54. Getting regular preventive care can help to keep you healthy and well. Preventive care includes getting regular testing and making lifestyle changes as recommended by your health care provider. Talk with your health care provider about:  Which screenings and tests you should have. A screening is a test that checks for a disease when you have no symptoms.  A diet and exercise plan that is right for you. What should I know about screenings and tests to prevent falls? Screening and testing are the best ways to find a health problem early. Early diagnosis and treatment give you the best chance of managing medical conditions that are common after age 66. Certain  conditions and lifestyle choices may make you more likely to have a fall. Your health care provider may recommend:  Regular vision checks. Poor vision and conditions such as cataracts can make you more likely to have a fall. If you wear glasses, make sure to get your prescription updated if your vision changes.  Medicine review. Work with your health care provider to regularly review all of the medicines you are taking, including over-the-counter medicines. Ask your health care provider about any side effects that may make you more likely to have a fall. Tell your health care provider if any medicines that you take make you feel dizzy or sleepy.  Osteoporosis screening. Osteoporosis is a condition that causes the bones to get weaker. This can make the bones weak and cause them to break more easily.  Blood pressure screening. Blood pressure changes and medicines to control blood pressure can make you feel dizzy.  Strength and balance checks. Your health care provider may recommend certain tests to check your strength and balance while standing, walking, or changing positions.  Foot health exam. Foot pain and numbness, as well as not wearing proper footwear, can make you more likely to have a fall.  Depression screening. You may be more likely to have a fall if you have a fear of falling, feel emotionally low,  or feel unable to do activities that you used to do.  Alcohol use screening. Using too much alcohol can affect your balance and may make you more likely to have a fall. What actions can I take to lower my risk of falls? General instructions  Talk with your health care provider about your risks for falling. Tell your health care provider if: ? You fall. Be sure to tell your health care provider about all falls, even ones that seem minor. ? You feel dizzy, sleepy, or off-balance.  Take over-the-counter and prescription medicines only as told by your health care provider. These include any  supplements.  Eat a healthy diet and maintain a healthy weight. A healthy diet includes low-fat dairy products, low-fat (lean) meats, and fiber from whole grains, beans, and lots of fruits and vegetables. Home safety  Remove any tripping hazards, such as rugs, cords, and clutter.  Install safety equipment such as grab bars in bathrooms and safety rails on stairs.  Keep rooms and walkways well-lit. Activity   Follow a regular exercise program to stay fit. This will help you maintain your balance. Ask your health care provider what types of exercise are appropriate for you.  If you need a cane or walker, use it as recommended by your health care provider.  Wear supportive shoes that have nonskid soles. Lifestyle  Do not drink alcohol if your health care provider tells you not to drink.  If you drink alcohol, limit how much you have: ? 0-1 drink a day for women. ? 0-2 drinks a day for men.  Be aware of how much alcohol is in your drink. In the [place] one drink equals one typical bottle of beer (12 oz), one-half glass of wine (5 oz), or one shot of hard liquor (1 oz).  Do not use any products that contain nicotine or tobacco, such as cigarettes and e-cigarettes. If you need help quitting, ask your health care provider. Summary  Having a healthy lifestyle and getting preventive care can help to protect your health and wellness after age 7.  Screening and testing are the best way to find a health problem early and help you avoid having a fall. Early diagnosis and treatment give you the best chance for managing medical conditions that are more common for people who are older than age 32.  Falls are a major cause of broken bones and head injuries in people who are older than age 79. Take precautions to prevent a fall at home.  Work with your health care provider to learn what changes you can make to improve your health and wellness and to prevent falls. This information is not intended  to replace advice given to you by your health care provider. Make sure you discuss any questions you have with your health care provider. Document Revised: 09/16/2018 Document Reviewed: 04/08/2017 Elsevier Patient Education  2020 [employer].

## 2020-02-04 NOTE — Assessment & Plan Note (Signed)
I have congratulated her in reduction of   BMI and encouraged  Continued weight loss with goal of 10% of body weight over the next 6 months using a low glycemic index diet and regular exercise a minimum of 5 days per week.     

## 2020-02-04 NOTE — Assessment & Plan Note (Addendum)
Resolved.   She is no longer using zoloft or alprazolam   Insomnia managed with trazodone.

## 2020-02-04 NOTE — Assessment & Plan Note (Signed)
Thyroid function is WNL on current dose.  No current changes needed.    Lab Results  Component Value Date   TSH 2.11 02/02/2020

## 2020-02-04 NOTE — Assessment & Plan Note (Signed)
a1c remains below the threshhold for prediabetes.   She is following a low glycemic index diet but not exercising.   Lab Results  Component Value Date   HGBA1C 5.5 10/04/2019

## 2020-02-04 NOTE — Assessment & Plan Note (Signed)
Managed with  rosuvastatin for FRC risk > 15%.    LDL is now  <70  Lab Results  Component Value Date   LDLCALC 60 02/02/2020   Lab Results  Component Value Date   CHOL 124 02/02/2020   HDL 42.30 02/02/2020   LDLCALC 60 02/02/2020   LDLDIRECT 112.0 02/09/2019   TRIG 110.0 02/02/2020   CHOLHDL 3 02/02/2020

## 2020-02-04 NOTE — Assessment & Plan Note (Signed)

## 2020-02-15 ENCOUNTER — Ambulatory Visit: Payer: PPO | Admitting: Dermatology

## 2020-02-15 ENCOUNTER — Other Ambulatory Visit: Payer: Self-pay

## 2020-02-15 DIAGNOSIS — L820 Inflamed seborrheic keratosis: Secondary | ICD-10-CM | POA: Diagnosis not present

## 2020-02-15 DIAGNOSIS — L821 Other seborrheic keratosis: Secondary | ICD-10-CM

## 2020-02-15 DIAGNOSIS — D485 Neoplasm of uncertain behavior of skin: Secondary | ICD-10-CM

## 2020-02-15 DIAGNOSIS — L578 Other skin changes due to chronic exposure to nonionizing radiation: Secondary | ICD-10-CM | POA: Diagnosis not present

## 2020-02-15 NOTE — Patient Instructions (Addendum)
Seborrheic Keratosis  What causes seborrheic keratoses? Seborrheic keratoses are harmless, common skin growths that first appear during adult life.  As time goes by, more growths appear.  Some people may develop a large number of them.  Seborrheic keratoses appear on both covered and uncovered body parts.  They are not caused by sunlight.  The tendency to develop seborrheic keratoses can be inherited.  They vary in color from skin-colored to gray, brown, or even black.  They can be either smooth or have a rough, warty surface.   Seborrheic keratoses are superficial and look as if they were stuck on the skin.  Under the microscope this type of keratosis looks like layers upon layers of skin.  That is why at times the top layer may seem to fall off, but the rest of the growth remains and re-grows.    Treatment Seborrheic keratoses do not need to be treated, but can easily be removed in the office.  Seborrheic keratoses often cause symptoms when they rub on clothing or jewelry.  Lesions can be in the way of shaving.  If they become inflamed, they can cause itching, soreness, or burning.  Removal of a seborrheic keratosis can be accomplished by freezing, burning, or surgery. If any spot bleeds, scabs, or grows rapidly, please return to have it checked, as these can be an indication of a skin cancer.  Wound Care Instructions  1. Cleanse wound gently with soap and water once a day then pat dry with clean gauze. Apply a thing coat of Petrolatum (petroleum jelly, "Vaseline") over the wound (unless you have an allergy to this). We recommend that you use a new, sterile tube of Vaseline. Do not pick or remove scabs. Do not remove the yellow or white "healing tissue" from the base of the wound.  2. Cover the wound with fresh, clean, nonstick gauze and secure with paper tape. You may use Band-Aids in place of gauze and tape if the would is small enough, but would recommend trimming much of the tape off as there is  often too much. Sometimes Band-Aids can irritate the skin.  3. You should call the office for your biopsy report after 1 week if you have not already been contacted.  4. If you experience any problems, such as abnormal amounts of bleeding, swelling, significant bruising, significant pain, or evidence of infection, please call the office immediately.  5. FOR ADULT SURGERY PATIENTS: If you need something for pain relief you may take 1 extra strength Tylenol (acetaminophen) AND 2 Ibuprofen (200mg  each) together every 4 hours as needed for pain. (do not take these if you are allergic to them or if you have a reason you should not take them.) Typically, you may only need pain medication for 1 to 3 days.    Electrodesiccation and Curettage ("Scrape and Burn") Wound Care Instructions  6. Leave the original bandage on for 24 hours if possible.  If the bandage becomes soaked or soiled before that time, it is OK to remove it and examine the wound.  A small amount of post-operative bleeding is normal.  If excessive bleeding occurs, remove the bandage, place gauze over the site and apply continuous pressure (no peeking) over the area for 30 minutes. If this does not work, please call our clinic as soon as possible or page your doctor if it is after hours.   7. Once a day, cleanse the wound with soap and water. It is fine to shower. If a thick  crust develops you may use a Q-tip dipped into dilute hydrogen peroxide (mix 1:1 with water) to dissolve it.  Hydrogen peroxide can slow the healing process, so use it only as needed.    8. After washing, apply petroleum jelly (Vaseline) or an antibiotic ointment if your doctor prescribed one for you, followed by a bandage.    9. For best healing, the wound should be covered with a layer of ointment at all times. If you are not able to keep the area covered with a bandage to hold the ointment in place, this may mean re-applying the ointment several times a day.  Continue  this wound care until the wound has healed and is no longer open. It may take several weeks for the wound to heal and close.  Itching and mild discomfort is normal during the healing process.  If you have any discomfort, you can take Tylenol (acetaminophen) or ibuprofen as directed on the bottle. (Please do not take these if you have an allergy to them or cannot take them for another reason).  Some redness, tenderness and white or yellow material in the wound is normal healing.  If the area becomes very sore and red, or develops a thick yellow-green material (pus), it may be infected; please notify us.    Wound healing continues for up to one year following surgery. It is not unusual to experience pain in the scar from time to time during the interval.  If the pain becomes severe or the scar thickens, you should notify the office.    A slight amount of redness in a scar is expected for the first six months.  After six months, the redness will fade and the scar will soften and fade.  The color difference becomes less noticeable with time.  If there are any problems, return for a post-op surgery check at your earliest convenience.  To improve the appearance of the scar, you can use silicone scar gel, cream, or sheets (such as Mederma or Serica) every night for up to one year. These are available over the counter (without a prescription).  Please call our office at [phone] for any questions or concerns.

## 2020-02-15 NOTE — Progress Notes (Signed)
Follow-Up Visit   Subjective  Lorraine Mills is a 69 y.o. female who presents for the following: lesion (L breast - has been there for years but recently over the past 2-3 weeks has started to grow. Lesion can be very itchy) and lesions (inframammary - very itchy at times).  The following portions of the chart were reviewed this encounter and updated as appropriate:  Tobacco  Allergies  Meds  Problems  Med Hx  Surg Hx  Fam Hx     Review of Systems:  No other skin or systemic complaints except as noted in HPI or Assessment and Plan.  Objective  Well appearing patient in no apparent distress; mood and affect are within normal limits.  A focused examination was performed including the trunk. Relevant physical exam findings are noted in the Assessment and Plan.  Objective  inframammary (17): Erythematous keratotic or waxy stuck-on papule or plaque.   Objective  L med breast: 1.1 cm hyperkeratotic papule  Assessment & Plan  Inflamed seborrheic keratosis (17) inframammary  Destruction of lesion - inframammary Complexity: simple   Destruction method: cryotherapy   Informed consent: discussed and consent obtained   Timeout:  patient name, date of birth, surgical site, and procedure verified Lesion destroyed using liquid nitrogen: Yes   Region frozen until ice ball extended beyond lesion: Yes   Outcome: patient tolerated procedure well with no complications   Post-procedure details: wound care instructions given    Neoplasm of uncertain behavior of skin L med breast  Epidermal / dermal shaving  Lesion diameter (cm):  1.1 Informed consent: discussed and consent obtained   Timeout: patient name, date of birth, surgical site, and procedure verified   Procedure prep:  Patient was prepped and draped in usual sterile fashion Prep type:  Isopropyl alcohol Anesthesia: the lesion was anesthetized in a standard fashion   Anesthetic:  1% lidocaine w/ epinephrine 1-100,000  buffered w/ 8.4% NaHCO3 Instrument used: flexible razor blade   Hemostasis achieved with: pressure, aluminum chloride and electrodesiccation   Outcome: patient tolerated procedure well   Post-procedure details: sterile dressing applied and wound care instructions given   Dressing type: bandage and petrolatum    Destruction of lesion Complexity: extensive   Destruction method: electrodesiccation and curettage   Informed consent: discussed and consent obtained   Timeout:  patient name, date of birth, surgical site, and procedure verified Procedure prep:  Patient was prepped and draped in usual sterile fashion Prep type:  Isopropyl alcohol Anesthesia: the lesion was anesthetized in a standard fashion   Anesthetic:  1% lidocaine w/ epinephrine 1-100,000 buffered w/ 8.4% NaHCO3 Curettage performed in three different directions: Yes   Electrodesiccation performed over the curetted area: Yes   Lesion length (cm):  1.1 Lesion width (cm):  1.1 Margin per side (cm):  0.2 Final wound size (cm):  1.5 Hemostasis achieved with:  pressure, aluminum chloride and electrodesiccation Outcome: patient tolerated procedure well with no complications   Post-procedure details: sterile dressing applied and wound care instructions given   Dressing type: bandage and petrolatum    Specimen 1 - Surgical pathology Differential Diagnosis: ISK vs SCC vs other D48.5 Check Margins: No 1.1 cm hyperkeratotic papule   Seborrheic Keratoses - Stuck-on, waxy, tan-brown papules and plaques  - Discussed benign etiology and prognosis. - Observe - Call for any changes  Actinic Damage - diffuse scaly erythematous macules with underlying dyspigmentation - Recommend daily broad spectrum sunscreen SPF 30+ to sun-exposed areas, reapply every 2 hours  as needed.  - Call for new or changing lesions.  Return for F/U appt. in 6-8 weeks, recheck ISK's .  Luther Redo, CMA, am acting as scribe for Sarina Ser, MD  .  Documentation: I have reviewed the above documentation for accuracy and completeness, and I agree with the above.  Sarina Ser, MD

## 2020-02-16 ENCOUNTER — Encounter: Payer: Self-pay | Admitting: Dermatology

## 2020-02-21 ENCOUNTER — Telehealth: Payer: Self-pay

## 2020-02-21 NOTE — Telephone Encounter (Signed)
Patient informed of pathology results via [facility]. She had no further questions.

## 2020-02-21 NOTE — Telephone Encounter (Signed)
-----   Message from Ralene Bathe, MD sent at 02/20/2020  5:23 PM EDT ----- Skin , left med breast SEBORRHEIC KERATOSIS, IRRITATED  Benign irritated keratosis No further treatment needed

## 2020-03-14 ENCOUNTER — Other Ambulatory Visit: Payer: Self-pay | Admitting: Internal Medicine

## 2020-03-28 ENCOUNTER — Ambulatory Visit (INDEPENDENT_AMBULATORY_CARE_PROVIDER_SITE_OTHER): Payer: PPO

## 2020-03-28 VITALS — Ht 61.0 in | Wt 136.0 lb

## 2020-03-28 DIAGNOSIS — Z0000 Encounter for general adult medical examination without abnormal findings: Secondary | ICD-10-CM | POA: Diagnosis not present

## 2020-03-28 NOTE — Patient Instructions (Addendum)
Lorraine Mills , Thank you for taking time to come for your Medicare Wellness Visit. I appreciate your ongoing commitment to your health goals. Please review the following plan we discussed and let me know if I can assist you in the future.   These are the goals we discussed: Goals    . Lose weight     Walking every other day, 30 minutes Stay hydrated Low carb diet       This is a list of the screening recommended for you and due dates:  Health Maintenance  Topic Date Due  . Pneumonia vaccines (2 of 2 - PPSV23) 09/25/2018  . Flu Shot  09/06/2020*  . Mammogram  04/17/2020  . Colon Cancer Screening  03/19/2021  . Tetanus Vaccine  10/08/2022  . DEXA scan (bone density measurement)  Completed  . COVID-19 Vaccine  Completed  .  Hepatitis C: One time screening is recommended by Center for Disease Control  (CDC) for  adults born from [birth year] through 1965.   Completed  *Topic was postponed. The date shown is not the original due date.    Immunizations Immunization History  Administered Date(s) Administered  . Influenza, High Dose Seasonal PF 03/27/2015  . Influenza-Unspecified 04/26/2019  . Moderna SARS-COVID-2 Vaccination 07/21/2019, 08/18/2019  . Pneumococcal Conjugate-13 09/24/2017  . Tdap 10/07/2012  . Zoster Recombinat (Shingrix) 02/24/2020   Keep all routine maintenance appointments.   Follow up 03/30/20 @ 11:00; R hip pain MRI referral   Follow up 08/09/20 @ 8:30  Advanced directives: mailed to patient per request  Conditions/risks identified: Continued R hip pain. Requests MRI. Virtual visit scheduled with pcp.   Follow up in one year for your annual wellness visit.   Preventive Care 18 Years and Older, Female Preventive care refers to lifestyle choices and visits with your health care provider that can promote health and wellness. What does preventive care include?  A yearly physical exam. This is also called an annual well check.  Dental exams once or twice a  year.  Routine eye exams. Ask your health care provider how often you should have your eyes checked.  Personal lifestyle choices, including:  Daily care of your teeth and gums.  Regular physical activity.  Eating a healthy diet.  Avoiding tobacco and drug use.  Limiting alcohol use.  Practicing safe sex.  Taking low-dose aspirin every day.  Taking vitamin and mineral supplements as recommended by your health care provider. What happens during an annual well check? The services and screenings done by your health care provider during your annual well check will depend on your age, overall health, lifestyle risk factors, and family history of disease. Counseling  Your health care provider may ask you questions about your:  Alcohol use.  Tobacco use.  Drug use.  Emotional well-being.  Home and relationship well-being.  Sexual activity.  Eating habits.  History of falls.  Memory and ability to understand (cognition).  Work and work Statistician.  Reproductive health. Screening  You may have the following tests or measurements:  Height, weight, and BMI.  Blood pressure.  Lipid and cholesterol levels. These may be checked every 5 years, or more frequently if you are over 87 years old.  Skin check.  Lung cancer screening. You may have this screening every year starting at age 55 if you have a 30-pack-year history of smoking and currently smoke or have quit within the past 15 years.  Fecal occult blood test (FOBT) of the stool. You may have  this test every year starting at age 54.  Flexible sigmoidoscopy or colonoscopy. You may have a sigmoidoscopy every 5 years or a colonoscopy every 10 years starting at age 64.  Hepatitis C blood test.  Hepatitis B blood test.  Sexually transmitted disease (STD) testing.  Diabetes screening. This is done by checking your blood sugar (glucose) after you have not eaten for a while (fasting). You may have this done every 1-3  years.  Bone density scan. This is done to screen for osteoporosis. You may have this done starting at age 29.  Mammogram. This may be done every 1-2 years. Talk to your health care provider about how often you should have regular mammograms. Talk with your health care provider about your test results, treatment options, and if necessary, the need for more tests. Vaccines  Your health care provider may recommend certain vaccines, such as:  Influenza vaccine. This is recommended every year.  Tetanus, diphtheria, and acellular pertussis (Tdap, Td) vaccine. You may need a Td booster every 10 years.  Zoster vaccine. You may need this after age 39.  Pneumococcal 13-valent conjugate (PCV13) vaccine. One dose is recommended after age 30.  Pneumococcal polysaccharide (PPSV23) vaccine. One dose is recommended after age 31. Talk to your health care provider about which screenings and vaccines you need and how often you need them. This information is not intended to replace advice given to you by your health care provider. Make sure you discuss any questions you have with your health care provider. Document Released: 06/22/2015 Document Revised: 02/13/2016 Document Reviewed: 03/27/2015 Elsevier Interactive Patient Education  2017 [employer] Prevention in the Home Falls can cause injuries. They can happen to people of all ages. There are many things you can do to make your home safe and to help prevent falls. What can I do on the outside of my home?  Regularly fix the edges of walkways and driveways and fix any cracks.  Remove anything that might make you trip as you walk through a door, such as a raised step or threshold.  Trim any bushes or trees on the path to your home.  Use bright outdoor lighting.  Clear any walking paths of anything that might make someone trip, such as rocks or tools.  Regularly check to see if handrails are loose or broken. Make sure that both sides of any  steps have handrails.  Any raised decks and porches should have guardrails on the edges.  Have any leaves, snow, or ice cleared regularly.  Use sand or salt on walking paths during winter.  Clean up any spills in your garage right away. This includes oil or grease spills. What can I do in the bathroom?  Use night lights.  Install grab bars by the toilet and in the tub and shower. Do not use towel bars as grab bars.  Use non-skid mats or decals in the tub or shower.  If you need to sit down in the shower, use a plastic, non-slip stool.  Keep the floor dry. Clean up any water that spills on the floor as soon as it happens.  Remove soap buildup in the tub or shower regularly.  Attach bath mats securely with double-sided non-slip rug tape.  Do not have throw rugs and other things on the floor that can make you trip. What can I do in the bedroom?  Use night lights.  Make sure that you have a light by your bed that is easy to  reach.  Do not use any sheets or blankets that are too big for your bed. They should not hang down onto the floor.  Have a firm chair that has side arms. You can use this for support while you get dressed.  Do not have throw rugs and other things on the floor that can make you trip. What can I do in the kitchen?  Clean up any spills right away.  Avoid walking on wet floors.  Keep items that you use a lot in easy-to-reach places.  If you need to reach something above you, use a strong step stool that has a grab bar.  Keep electrical cords out of the way.  Do not use floor polish or wax that makes floors slippery. If you must use wax, use non-skid floor wax.  Do not have throw rugs and other things on the floor that can make you trip. What can I do with my stairs?  Do not leave any items on the stairs.  Make sure that there are handrails on both sides of the stairs and use them. Fix handrails that are broken or loose. Make sure that handrails are  as long as the stairways.  Check any carpeting to make sure that it is firmly attached to the stairs. Fix any carpet that is loose or worn.  Avoid having throw rugs at the top or bottom of the stairs. If you do have throw rugs, attach them to the floor with carpet tape.  Make sure that you have a light switch at the top of the stairs and the bottom of the stairs. If you do not have them, ask someone to add them for you. What else can I do to help prevent falls?  Wear shoes that:  Do not have high heels.  Have rubber bottoms.  Are comfortable and fit you well.  Are closed at the toe. Do not wear sandals.  If you use a stepladder:  Make sure that it is fully opened. Do not climb a closed stepladder.  Make sure that both sides of the stepladder are locked into place.  Ask someone to hold it for you, if possible.  Clearly mark and make sure that you can see:  Any grab bars or handrails.  First and last steps.  Where the edge of each step is.  Use tools that help you move around (mobility aids) if they are needed. These include:  Canes.  Walkers.  Scooters.  Crutches.  Turn on the lights when you go into a dark area. Replace any light bulbs as soon as they burn out.  Set up your furniture so you have a clear path. Avoid moving your furniture around.  If any of your floors are uneven, fix them.  If there are any pets around you, be aware of where they are.  Review your medicines with your doctor. Some medicines can make you feel dizzy. This can increase your chance of falling. Ask your doctor what other things that you can do to help prevent falls. This information is not intended to replace advice given to you by your health care provider. Make sure you discuss any questions you have with your health care provider. Document Released: 03/22/2009 Document Revised: 11/01/2015 Document Reviewed: 06/30/2014 Elsevier Interactive Patient Education  2017 [employer].

## 2020-03-28 NOTE — Progress Notes (Addendum)
Subjective:   Lorraine Mills is a 69 y.o. female who presents for Medicare Annual (Subsequent) preventive examination.  Review of Systems    No ROS.  Medicare Wellness Virtual Visit.   Cardiac Risk Factors include: advanced age (>72men, >62 women)     Objective:    Today's Vitals   03/28/20 0932  Weight: 136 lb (61.7 kg)  Height: 5\' 1"  (1.549 m)   Body mass index is 25.7 kg/m.  Advanced Directives 03/28/2020 03/28/2019 02/05/2018 03/25/2016 03/19/2016 03/25/2015  Does Patient Have a Medical Advance Directive? No Yes No No No No  Does patient want to make changes to medical advance directive? - No - Patient declined Yes (MAU/Ambulatory/Procedural Areas - Information given) - - -  Would patient like information on creating a medical advance directive? No - Patient declined - - Yes - Educational materials given - No - patient declined information    Current Medications (verified) Outpatient Encounter Medications as of 03/28/2020  Medication Sig   ALPRAZolam (XANAX) 0.5 MG tablet TAKE 1 TABLET BY MOUTH AT BEDTIME AS NEEDED FOR ANXIETY (Patient not taking: Reported on 02/02/2020)   fexofenadine-pseudoephedrine (ALLEGRA-D 24) 180-240 MG per 24 hr tablet Take 1 tablet by mouth daily.   fluticasone (FLONASE) 50 MCG/ACT nasal spray Place 2 sprays into the nose daily.   levothyroxine (SYNTHROID) 75 MCG tablet TAKE 1 TABLET EVERY DAY ON EMPTY STOMACHWITH A GLASS OF WATER AT LEAST 30-60 MINBEFORE BREAKFAST   prednisoLONE acetate (PRED FORTE) 1 % ophthalmic suspension Place 1 drop into the right eye 4 (four) times daily.   rosuvastatin (CRESTOR) 10 MG tablet TAKE ONE TABLET EVERY DAY   traZODone (DESYREL) 50 MG tablet TAKE ONE TABLET AT BEDTIME AS NEEDED FORSLEEP   No facility-administered encounter medications on file as of 03/28/2020.    Allergies (verified) Latex and Levofloxacin   History: Past Medical History:  Diagnosis Date   Anxiety    Goiter    Hyperlipidemia    TGs    Seasonal allergies    Past Surgical History:  Procedure Laterality Date   ABDOMINAL HYSTERECTOMY     supracervical    BILATERAL OOPHORECTOMY  2001   BENIGN TUMOR   BREAST BIOPSY Left 1985   cyst    CATARACT EXTRACTION Right    COLONOSCOPY  08/17/2009   COLONOSCOPY WITH PROPOFOL N/A 03/19/2016   Procedure: COLONOSCOPY WITH PROPOFOL;  Surgeon: Robert Bellow, MD;  Location: [facility] ENDOSCOPY;  Service: Endoscopy;  Laterality: N/A;   CORNEAL TRANSPLANT Right    Family History  Problem Relation Age of Onset   Cancer Mother        OVARIAN   Stroke Father    Breast cancer Sister 51   Thyroid disease Other        MULTIPLE FAMILY MEMBERS   Atrial fibrillation Brother    Social History   Socioeconomic History   Marital status: Married    Spouse name: Not on file   Number of children: Not on file   Years of education: Not on file   Highest education level: Not on file  Occupational History   Not on file  Tobacco Use   Smoking status: Former Smoker    Years: 24.00    Types: Cigarettes    Quit date: 05/09/1998    Years since quitting: 21.9   Smokeless tobacco: Never Used  Substance and Sexual Activity   Alcohol use: Yes    Alcohol/week: 0.0 standard drinks    Comment: once a  week   Drug use: No   Sexual activity: Not on file  Other Topics Concern   Not on file  Social History Narrative   Not on file   Social Determinants of Health   Financial Resource Strain: Low Risk    Difficulty of Paying Living Expenses: Not hard at all  Food Insecurity: No Food Insecurity   Worried About Running Out of Food in the Last Year: Never true   [place] in the Last Year: Never true  Transportation Needs: No Transportation Needs   Lack of Transportation (Medical): No   Lack of Transportation (Non-Medical): No  Physical Activity: Insufficiently Active   Days of Exercise per Week: 3 days   Minutes of Exercise per Session: 30 min  Stress: No Stress Concern Present   Feeling of  Stress : Not at all  Social Connections:    Frequency of Communication with Friends and Family: Not on file   Frequency of Social Gatherings with Friends and Family: Not on file   Attends Religious Services: Not on Electrical engineer or Organizations: Not on file   Attends Archivist Meetings: Not on file   Marital Status: Not on file    Tobacco Counseling Counseling given: Not Answered   Clinical Intake:  Pre-visit preparation completed: Yes        Diabetes: No  How often do you need to have someone help you when you read instructions, pamphlets, or other written materials from your doctor or pharmacy?: 1 - Never Interpreter Needed?: No      Activities of Daily Living In your present state of health, do you have any difficulty performing the following activities: 03/28/2020  Hearing? N  Vision? N  Difficulty concentrating or making decisions? N  Walking or climbing stairs? N  Dressing or bathing? N  Doing errands, shopping? N  Preparing Food and eating ? N  Using the Toilet? N  In the past six months, have you accidently leaked urine? N  Do you have problems with loss of bowel control? N  Managing your Medications? N  Managing your Finances? N  Housekeeping or managing your Housekeeping? N  Some recent data might be hidden    Patient Care Team: [name], MD as PCP - General (Internal Medicine) [name], MD (Internal Medicine) Bary Castilla Forest Gleason, MD (General Surgery)  Indicate any recent Medical Services you may have received from other than [facility] providers in the past year (date may be approximate).     Assessment:   This is a routine wellness examination for Lorraine Mills.  I connected with Lorraine Mills today by telephone and verified that I am speaking with the correct person using two identifiers. Location patient: home Location provider: work Persons participating in the virtual visit: patient, Marine scientist.    I discussed the  limitations, risks, security and privacy concerns of performing an evaluation and management service by telephone and the availability of in person appointments. The patient expressed understanding and verbally consented to this telephonic visit.    Interactive audio and video telecommunications were attempted between this provider and patient, however failed, due to patient having technical difficulties OR patient did not have access to video capability.  We continued and completed visit with audio only.  Some vital signs may be absent or patient reported.   Hearing/Vision screen  Hearing Screening   125Hz  250Hz  500Hz  1000Hz  2000Hz  3000Hz  4000Hz  6000Hz  8000Hz   Right ear:  Left ear:           Comments: Patient is able to hear conversational tones without difficulty.  No issues reported.  Vision Screening Comments: Wears corrective lenses Visual acuity not assessed, virtual visit.  They have seen their ophthalmologist in the last 12 months.   Dietary issues and exercise activities discussed: Current Exercise Habits: Home exercise routine, Type of exercise: walking, Time (Minutes): 30, Frequency (Times/Week): 3, Weekly Exercise (Minutes/Week): 90, Intensity: Mild[place] diet  Goals      Lose weight     Walking every other day, 30 minutes Stay hydrated Low carb diet       Depression Screen PHQ 2/9 Scores 03/28/2020 02/02/2020 03/28/2019 01/26/2019 02/05/2018 01/29/2016  PHQ - 2 Score 0 0 0 0 0 3  PHQ- 9 Score 0 0 - 4 - 16    Fall Risk Fall Risk  03/28/2020 02/02/2020 07/21/2019 03/28/2019 03/28/2019  Falls in the past year? 0 0 0 0 0  Number falls in past yr: 0 - - - 0  Follow up Falls evaluation completed Falls evaluation completed Falls evaluation completed Falls evaluation completed -   Handrails in use when climbing stairs? Yes Home free of loose throw rugs in walkways, pet beds, electrical cords, etc? Yes  Adequate lighting in your home to reduce risk of falls? Yes    ASSISTIVE DEVICES UTILIZED TO PREVENT FALLS: Use of a cane, walker or w/c? No   TIMED UP AND GO: Was the test performed? No . Virtual visit.  Cognitive Function: Patient is alert and oriented x3. Denies difficulty focusing, making decisions, memory loss.  Enjoys reading and other brain health activities.   MMSE - Mini Mental State Exam 02/05/2018  Orientation to time 5  Orientation to Place 5  Registration 3  Attention/ Calculation 5  Recall 3  Language- name 2 objects 2  Language- repeat 1  Language- follow 3 step command 3  Language- read & follow direction 1  Write a sentence 1  Copy design 1  Total score 30     6CIT Screen 03/28/2019  What Year? 0 points  What month? 0 points  What time? 0 points  Count back from 20 0 points  Months in reverse 0 points    Immunizations Immunization History  Administered Date(s) Administered   Influenza, High Dose Seasonal PF 03/27/2015   Influenza-Unspecified 04/26/2019   Moderna SARS-COVID-2 Vaccination 07/21/2019, 08/18/2019   Pneumococcal Conjugate-13 09/24/2017   Tdap 10/07/2012   Zoster Recombinat (Shingrix) 02/24/2020    Health Maintenance Health Maintenance  Topic Date Due   PNA vac Low Risk Adult (2 of 2 - PPSV23) 09/25/2018   INFLUENZA VACCINE  09/06/2020 (Originally 01/08/2020)   MAMMOGRAM  04/17/2020   COLONOSCOPY  03/19/2021   TETANUS/TDAP  10/08/2022   DEXA SCAN  Completed   COVID-19 Vaccine  Completed   Hepatitis C Screening  Completed   Dental Screening: Recommended annual dental exams for proper oral hygiene  Community Resource Referral / Chronic Care Management: CRR required this visit?  No   CCM required this visit?  No      Plan:   Keep all routine maintenance appointments.   Follow up 03/30/20 @ 11:00; R hip pain MRI referral   Follow up 08/09/20 @ 8:30  I have personally reviewed and noted the following in the patient's chart:   Medical and social history Use of alcohol, tobacco or  illicit drugs  Current medications and supplements Functional ability and status Nutritional status Physical  activity Advanced directives List of other physicians Hospitalizations, surgeries, and ER visits in previous 12 months Vitals Screenings to include cognitive, depression, and falls Referrals and appointments  In addition, I have reviewed and discussed with patient certain preventive protocols, quality metrics, and best practice recommendations. A written personalized care plan for preventive services as well as general preventive health recommendations were provided to patient via mychart.     OBrien-Blaney, Tylor Courtwright L, LPN   [id number]      I have reviewed the above information and agree with above.   Deborra Medina, MD

## 2020-03-30 ENCOUNTER — Telehealth (INDEPENDENT_AMBULATORY_CARE_PROVIDER_SITE_OTHER): Payer: PPO | Admitting: Internal Medicine

## 2020-03-30 VITALS — Ht 61.0 in | Wt 136.0 lb

## 2020-03-30 DIAGNOSIS — F409 Phobic anxiety disorder, unspecified: Secondary | ICD-10-CM

## 2020-03-30 DIAGNOSIS — M25551 Pain in right hip: Secondary | ICD-10-CM | POA: Diagnosis not present

## 2020-03-30 DIAGNOSIS — G8929 Other chronic pain: Secondary | ICD-10-CM

## 2020-03-30 DIAGNOSIS — F5105 Insomnia due to other mental disorder: Secondary | ICD-10-CM | POA: Diagnosis not present

## 2020-03-30 DIAGNOSIS — E663 Overweight: Secondary | ICD-10-CM | POA: Diagnosis not present

## 2020-03-30 MED ORDER — PREDNISONE 10 MG PO TABS: ORAL_TABLET | ORAL | 0 refills | Status: DC

## 2020-03-30 MED ORDER — OMEPRAZOLE 20 MG PO CPDR: 20.0000 mg | DELAYED_RELEASE_CAPSULE | Freq: Every day | ORAL | 3 refills | Status: DC

## 2020-03-30 MED ORDER — ALPRAZOLAM 0.5 MG PO TABS: 0.5000 mg | ORAL_TABLET | Freq: Every evening | ORAL | 0 refills | Status: DC | PRN

## 2020-03-30 NOTE — Patient Instructions (Addendum)
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  This can be taken with advil, aleve or prednisone    Do NOT COMBINE ADVIL OR ALEVE WITH PREDNISONE,  HOWEVER

## 2020-04-01 ENCOUNTER — Encounter: Payer: Self-pay | Admitting: Internal Medicine

## 2020-04-01 DIAGNOSIS — M25551 Pain in right hip: Secondary | ICD-10-CM | POA: Insufficient documentation

## 2020-04-01 NOTE — Assessment & Plan Note (Signed)
She has achieved a weight of of 35 lbs,  Or 20% of her starting weight using the [place]. BMI is now normal.

## 2020-04-01 NOTE — Assessment & Plan Note (Addendum)
Symptoms currently controlled with trazodone qhs and prn alprazolam. .  No changes to medications.  The risks and benefits of benzodiazepine use were reviewed with patient today including excessive sedation leading to respiratory depression,  impaired thinking/driving, and addiction.  Patient was advised to avoid concurrent use with alcohol, to use medication only as needed and not to share with others  .

## 2020-04-01 NOTE — Assessment & Plan Note (Signed)
Will treat for bursitis with prednisone taper and obtai plain films to look for evidence of DJD

## 2020-04-01 NOTE — Progress Notes (Signed)
Virtual Visit via [place]  This visit type was conducted due to national recommendations for restrictions regarding the COVID-19 pandemic (e.g. social distancing).  This format is felt to be most appropriate for this patient at this time.  All issues noted in this document were discussed and addressed.  No physical exam was performed (except for noted visual exam findings with Video Visits).   I connected with@ on 03/31/20 at 11:00 AM EDT by a video enabled telemedicine applicationand verified that I am speaking with the correct person using two identifiers. Location patient: home Location provider: work or home office Persons participating in the virtual visit: patient, provider  I discussed the limitations, risks, security and privacy concerns of performing an evaluation and management service by telephone and the availability of in person appointments. I also discussed with the patient that there may be a patient responsible charge related to this service. The patient expressed understanding and agreed to proceed.  Reason for visit: follow up  HPI:  69 yr old female presents with right hip pain aggravated by inactivity and prolonged standing or walking. No history of trauma or falls.  Pain is located on lateral hip   Has been taking up to 800 mg motrin once daily . Notes decreased ROM.  Otherwise she feels generally well.  Has reached her goal weight using the [place] an average ot 7 hours with trazodone and alprazolam    ROS: See pertinent positives and negatives per HPI.  Past Medical History:  Diagnosis Date  . Anxiety   . Depression 01/31/2016  . Goiter   . Hyperlipidemia    TGs  . Seasonal allergies     Past Surgical History:  Procedure Laterality Date  . ABDOMINAL HYSTERECTOMY     supracervical   . BILATERAL OOPHORECTOMY  2001   BENIGN TUMOR  . BREAST BIOPSY Left 1985   cyst   . CATARACT EXTRACTION Right   . COLONOSCOPY  08/17/2009  . COLONOSCOPY  WITH PROPOFOL N/A 03/19/2016   Procedure: COLONOSCOPY WITH PROPOFOL;  Surgeon: Robert Bellow, MD;  Location: [facility] ENDOSCOPY;  Service: Endoscopy;  Laterality: N/A;  . CORNEAL TRANSPLANT Right     Family History  Problem Relation Age of Onset  . Cancer Mother        OVARIAN  . Stroke Father   . Breast cancer Sister 47  . Thyroid disease Other        MULTIPLE FAMILY MEMBERS  . Atrial fibrillation Brother     SOCIAL HX:  reports that she quit smoking about 21 years ago. Her smoking use included cigarettes. She quit after 24.00 years of use. She has never used smokeless tobacco. She reports current alcohol use. She reports that she does not use drugs.   Current Outpatient Medications:  .  ALPRAZolam (XANAX) 0.5 MG tablet, Take 1 tablet (0.5 mg total) by mouth at bedtime as needed for anxiety., Disp: 30 tablet, Rfl: 0 .  fexofenadine-pseudoephedrine (ALLEGRA-D 24) 180-240 MG per 24 hr tablet, Take 1 tablet by mouth daily., Disp: , Rfl:  .  fluticasone (FLONASE) 50 MCG/ACT nasal spray, Place 2 sprays into the nose daily., Disp: , Rfl:  .  levothyroxine (SYNTHROID) 75 MCG tablet, TAKE 1 TABLET EVERY DAY ON EMPTY STOMACHWITH A GLASS OF WATER AT LEAST 30-60 MINBEFORE BREAKFAST, Disp: 90 tablet, Rfl: 0 .  omeprazole (PRILOSEC) 20 MG capsule, Take 1 capsule (20 mg total) by mouth daily., Disp: 30 capsule, Rfl: 3 .  prednisoLONE  acetate (PRED FORTE) 1 % ophthalmic suspension, Place 1 drop into the right eye 4 (four) times daily., Disp: , Rfl:  .  predniSONE (DELTASONE) 10 MG tablet, 6 tablets daily for 3 days, then reduce by 1 tablet daily until gone, Disp: 33 tablet, Rfl: 0 .  rosuvastatin (CRESTOR) 10 MG tablet, TAKE ONE TABLET EVERY DAY, Disp: 30 tablet, Rfl: 5 .  traZODone (DESYREL) 50 MG tablet, TAKE ONE TABLET AT BEDTIME AS NEEDED FORSLEEP, Disp: 90 tablet, Rfl: 1  EXAM:  VITALS per patient if applicable:  GENERAL: alert, oriented, appears well and in no acute distress  HEENT:  atraumatic, conjunttiva clear, no obvious abnormalities on inspection of external nose and ears  NECK: normal movements of the head and neck  LUNGS: on inspection no signs of respiratory distress, breathing rate appears normal, no obvious gross SOB, gasping or wheezing  CV: no obvious cyanosis  MS: moves all visible extremities without noticeable abnormality  PSYCH/NEURO: pleasant and cooperative, no obvious depression or anxiety, speech and thought processing grossly intact  ASSESSMENT AND PLAN:  Discussed the following assessment and plan:  Chronic hip pain, right - Plan: DG Hip Unilat W OR W/O Pelvis 2-3 Views Right  Overweight (BMI 25.0-29.9)  Insomnia due to anxiety and fear  Lateral pain of right hip  Overweight (BMI 25.0-29.9) She has achieved a weight of of 35 lbs,  Or 20% of her starting weight using the [place]. BMI is now normal.   Insomnia due to anxiety and fear Symptoms currently controlled with trazodone qhs and prn alprazolam. .  No changes to medications.  The risks and benefits of benzodiazepine use were reviewed with patient today including excessive sedation leading to respiratory depression,  impaired thinking/driving, and addiction.  Patient was advised to avoid concurrent use with alcohol, to use medication only as needed and not to share with others  .   Lateral pain of right hip Will treat for bursitis with prednisone taper and obtai plain films to look for evidence of DJD     I discussed the assessment and treatment plan with the patient. The patient was provided an opportunity to ask questions and all were answered. The patient agreed with the plan and demonstrated an understanding of the instructions.   The patient was advised to call back or seek an in-person evaluation if the symptoms worsen or if the condition fails to improve as anticipated.  I provided 30 minutes of face-to-face time during this encounter.   [name], MD

## 2020-04-03 ENCOUNTER — Other Ambulatory Visit: Payer: Self-pay

## 2020-04-03 ENCOUNTER — Ambulatory Visit (INDEPENDENT_AMBULATORY_CARE_PROVIDER_SITE_OTHER): Payer: PPO

## 2020-04-03 ENCOUNTER — Other Ambulatory Visit: Payer: PPO

## 2020-04-03 DIAGNOSIS — G8929 Other chronic pain: Secondary | ICD-10-CM | POA: Diagnosis not present

## 2020-04-03 DIAGNOSIS — M25551 Pain in right hip: Secondary | ICD-10-CM

## 2020-04-03 DIAGNOSIS — M1611 Unilateral primary osteoarthritis, right hip: Secondary | ICD-10-CM | POA: Diagnosis not present

## 2020-04-04 ENCOUNTER — Telehealth: Payer: Self-pay | Admitting: Internal Medicine

## 2020-04-04 NOTE — Telephone Encounter (Signed)
She is due for Pneumovax #1 of 2 (every 5 years )

## 2020-04-04 NOTE — Telephone Encounter (Signed)
Patient called and made an appointment to have a pneumonia injection, last one was April 2019. If patient is not due for one please let me know so I can call her. Thanks

## 2020-04-04 NOTE — Telephone Encounter (Signed)
She is due appt is fine.

## 2020-04-06 DIAGNOSIS — M25551 Pain in right hip: Secondary | ICD-10-CM

## 2020-04-06 NOTE — Progress Notes (Signed)
Your hip films were abnormal .  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  I recommend seeing an orthopedist to review options of treatment.  Who would you like to see?

## 2020-04-17 ENCOUNTER — Ambulatory Visit (INDEPENDENT_AMBULATORY_CARE_PROVIDER_SITE_OTHER): Payer: PPO

## 2020-04-17 ENCOUNTER — Other Ambulatory Visit: Payer: Self-pay

## 2020-04-17 DIAGNOSIS — Z23 Encounter for immunization: Secondary | ICD-10-CM

## 2020-04-17 NOTE — Progress Notes (Signed)
Patient presented for Pneumovax injection to left deltoid, patient voiced no concerns nor showed any signs of distress during injection.

## 2020-04-18 ENCOUNTER — Ambulatory Visit
Admission: RE | Admit: 2020-04-18 | Discharge: 2020-04-18 | Disposition: A | Discharge status: Home / Self Care | Payer: PPO | Source: Ambulatory Visit | Attending: Internal Medicine | Admitting: Internal Medicine

## 2020-04-18 DIAGNOSIS — Z1231 Encounter for screening mammogram for malignant neoplasm of breast: Secondary | ICD-10-CM | POA: Diagnosis not present

## 2020-04-19 DIAGNOSIS — M1611 Unilateral primary osteoarthritis, right hip: Secondary | ICD-10-CM | POA: Diagnosis not present

## 2020-04-19 DIAGNOSIS — M25551 Pain in right hip: Secondary | ICD-10-CM | POA: Diagnosis not present

## 2020-05-21 ENCOUNTER — Other Ambulatory Visit: Payer: Self-pay | Admitting: Internal Medicine

## 2020-06-14 ENCOUNTER — Other Ambulatory Visit: Payer: Self-pay | Admitting: Internal Medicine

## 2020-06-28 ENCOUNTER — Other Ambulatory Visit: Payer: Self-pay

## 2020-06-28 ENCOUNTER — Other Ambulatory Visit: Payer: PPO

## 2020-06-28 DIAGNOSIS — Z20822 Contact with and (suspected) exposure to covid-19: Secondary | ICD-10-CM | POA: Diagnosis not present

## 2020-06-29 ENCOUNTER — Other Ambulatory Visit: Payer: Self-pay

## 2020-06-29 ENCOUNTER — Encounter: Payer: Self-pay | Admitting: Internal Medicine

## 2020-06-29 ENCOUNTER — Telehealth (INDEPENDENT_AMBULATORY_CARE_PROVIDER_SITE_OTHER): Payer: PPO | Admitting: Internal Medicine

## 2020-06-29 VITALS — Ht 61.0 in | Wt 136.0 lb

## 2020-06-29 DIAGNOSIS — J0110 Acute frontal sinusitis, unspecified: Secondary | ICD-10-CM

## 2020-06-29 DIAGNOSIS — R0989 Other specified symptoms and signs involving the circulatory and respiratory systems: Secondary | ICD-10-CM

## 2020-06-29 DIAGNOSIS — R059 Cough, unspecified: Secondary | ICD-10-CM

## 2020-06-29 DIAGNOSIS — Z20822 Contact with and (suspected) exposure to covid-19: Secondary | ICD-10-CM

## 2020-06-29 DIAGNOSIS — B3731 Acute candidiasis of vulva and vagina: Secondary | ICD-10-CM

## 2020-06-29 DIAGNOSIS — J029 Acute pharyngitis, unspecified: Secondary | ICD-10-CM | POA: Diagnosis not present

## 2020-06-29 MED ORDER — AZITHROMYCIN 250 MG PO TABS: ORAL_TABLET | ORAL | 0 refills | Status: DC

## 2020-06-29 MED ORDER — ALBUTEROL SULFATE HFA 108 (90 BASE) MCG/ACT IN AERS: 1.0000 | INHALATION_SPRAY | Freq: Four times a day (QID) | RESPIRATORY_TRACT | 0 refills | Status: DC | PRN

## 2020-06-29 MED ORDER — FLUCONAZOLE 150 MG PO TABS: 150.0000 mg | ORAL_TABLET | Freq: Once | ORAL | 0 refills | Status: AC

## 2020-06-29 MED ORDER — METHYLPREDNISOLONE 4 MG PO TBPK: ORAL_TABLET | ORAL | 0 refills | Status: DC

## 2020-06-29 MED ORDER — ALBUTEROL SULFATE HFA 108 (90 BASE) MCG/ACT IN AERS: 2.0000 | INHALATION_SPRAY | Freq: Four times a day (QID) | RESPIRATORY_TRACT | 0 refills | Status: DC | PRN

## 2020-06-29 NOTE — Progress Notes (Signed)
Virtual Visit via Video Note  I connected with Lorraine Mills  on 06/29/20 at  2:40 PM EST by a video enabled telemedicine application and verified that I am speaking with the correct person using two identifiers.  Location patient: home,  Location provider:work or home office Persons participating in the virtual visit: patient, provider  I discussed the limitations of evaluation and management by telemedicine and the availability of in person appointments. The patient expressed understanding and agreed to proceed.   HPI: Daughter tested + covid 06/19/20 and she has been around daughter c/o nasal and chest congestion sore throat tried dayquil/nyquil not sure if helping she has a h/a but improved had 2/2 moderna vaccines coughing and nose with yellow phelgm no wheezing/sob home covid test neg 06/25/20 and pending one from 06/28/20 will let us know the result   ROS: See pertinent positives and negatives per HPI.  Past Medical History:  Diagnosis Date  . Anxiety   . Depression 01/31/2016  . Goiter   . Hyperlipidemia    TGs  . Seasonal allergies     Past Surgical History:  Procedure Laterality Date  . ABDOMINAL HYSTERECTOMY     supracervical   . BILATERAL OOPHORECTOMY  2001   BENIGN TUMOR  . BREAST BIOPSY Left 1985   cyst   . CATARACT EXTRACTION Right   . COLONOSCOPY  08/17/2009  . COLONOSCOPY WITH PROPOFOL N/A 03/19/2016   Procedure: COLONOSCOPY WITH PROPOFOL;  Surgeon: Robert Bellow, MD;  Location: [facility] ENDOSCOPY;  Service: Endoscopy;  Laterality: N/A;  . CORNEAL TRANSPLANT Right      Current Outpatient Medications:  .  albuterol (VENTOLIN HFA) 108 (90 Base) MCG/ACT inhaler, Inhale 1-2 puffs into the lungs every 6 (six) hours as needed for wheezing or shortness of breath., Disp: 18 g, Rfl: 0 .  azithromycin (ZITHROMAX) 250 MG tablet, 2 pills day 1 and 1 pill day 2-5 with food, Disp: 6 tablet, Rfl: 0 .  fluconazole (DIFLUCAN) 150 MG tablet, Take 1 tablet (150 mg total) by  mouth once for 1 dose., Disp: 1 tablet, Rfl: 0 .  levothyroxine (SYNTHROID) 75 MCG tablet, TAKE 1 TABLET EVERY DAY ON EMPTY STOMACHWITH A GLASS OF WATER AT LEAST 30-60 MINBEFORE BREAKFAST, Disp: 90 tablet, Rfl: 0 .  methylPREDNISolone (MEDROL DOSEPAK) 4 MG TBPK tablet, Use as directed, Disp: 21 tablet, Rfl: 0 .  omeprazole (PRILOSEC) 20 MG capsule, Take 1 capsule (20 mg total) by mouth daily., Disp: 30 capsule, Rfl: 3 .  rosuvastatin (CRESTOR) 10 MG tablet, TAKE ONE TABLET EVERY DAY, Disp: 30 tablet, Rfl: 5 .  traZODone (DESYREL) 50 MG tablet, TAKE ONE TABLET AT BEDTIME AS NEEDED FORSLEEP, Disp: 90 tablet, Rfl: 1  EXAM:  VITALS per patient if applicable:  GENERAL: alert, oriented, appears well and in no acute distress  HEENT: atraumatic, conjunttiva clear, no obvious abnormalities on inspection of external nose and ears  NECK: normal movements of the head and neck  LUNGS: on inspection no signs of respiratory distress, breathing rate appears normal, no obvious gross SOB, gasping or wheezing  CV: no obvious cyanosis  MS: moves all visible extremities without noticeable abnormality  PSYCH/NEURO: pleasant and cooperative, no obvious depression or anxiety, speech and thought processing grossly intact  ASSESSMENT AND PLAN:  Discussed the following assessment and plan:  Close exposure to COVID-19 virus Pending test from 06/28/20   Acute non-recurrent frontal sinusitis - Plan: azithromycin (ZITHROMAX) 250 MG tablet, methylPREDNISolone (MEDROL DOSEPAK) 4 MG TBPK tablet, albuterol (VENTOLIN HFA)  108 (90 Base) MCG/ACT inhaler  Sore throat - Plan: azithromycin (ZITHROMAX) 250 MG tablet, methylPREDNISolone (MEDROL DOSEPAK) 4 MG TBPK tablet  Chest congestion - Plan: azithromycin (ZITHROMAX) 250 MG tablet, methylPREDNISolone (MEDROL DOSEPAK) 4 MG TBPK tablet, albuterol (VENTOLIN HFA) 108 (90 Base) MCG/ACT inhaler, DISCONTINUED: albuterol (VENTOLIN HFA) 108 (90 Base) MCG/ACT inhaler  Yeast  vaginitis - Plan: fluconazole (DIFLUCAN) 150 MG tablet if needed   Cough - Plan: albuterol (VENTOLIN HFA) 108 (90 Base) MCG/ACT inhaler  There is no medication other than over the counter meds:  Mucinex dm green label for cough.  Vitamin C 1000 mg daily.  Vitamin D3 4000 Iu (units) daily.  Zinc 100 mg daily.  Quercetin 250-500 mg 2 times per day   Elderberry  Oil of oregano  cepacol or chloroseptic spray  Warm tea with honey and lemon  Hydration  Try to eat though you dont feel like it   Tylenol or Advil  Nasal saline  Flonase  If sneezing/runny nose over the counter allegra, zyrtec, claritin or xyzal at night  Monitor pulse oximeter, buy from [place] if oxygen is less than 90 please go to the hospital.        Are you feeling really sick? Shortness of breath, cough, chest pain?, dizziness? Confusion   If so let me know  If worsening, go to hospital or [facility] clinic Urgent care for further treatment   -we discussed possible serious and likely etiologies, options for evaluation and workup, limitations of telemedicine visit vs in person visit, treatment, treatment risks and precautions.    I discussed the assessment and treatment plan with the patient. The patient was provided an opportunity to ask questions and all were answered. The patient agreed with the plan and demonstrated an understanding of the instructions.    Time spent 20 min  Delorise Jackson, MD

## 2020-06-29 NOTE — Progress Notes (Signed)
Symptoms include congestion, sore throat, chest congestion, headache that has gotten better a bit.

## 2020-06-29 NOTE — Patient Instructions (Addendum)
There is no medication other than over the counter meds:  Mucinex dm green label for cough.  Vitamin C 1000 mg daily.  Vitamin D3 4000 Iu (units) daily.  Zinc 100 mg daily.  Quercetin 250-500 mg 2 times per day   Elderberry  Oil of oregano  cepacol or chloroseptic spray  Warm tea with honey and lemon  Hydration  Try to eat though you dont feel like it   Tylenol or Advil  Nasal saline  Flonase  If sneezing/runny nose over the counter allegra, zyrtec, claritin or xyzal at night  Monitor pulse oximeter, buy from [place] if oxygen is less than 90 please go to the hospital.        Are you feeling really sick? Shortness of breath, cough, chest pain?, dizziness? Confusion   If so let me know  If worsening, go to hospital or [facility] clinic Urgent care for further treatment

## 2020-06-30 LAB — NOVEL CORONAVIRUS, NAA: SARS-CoV-2, NAA: DETECTED — AB

## 2020-06-30 LAB — SARS-COV-2, NAA 2 DAY TAT

## 2020-07-04 ENCOUNTER — Encounter: Payer: Self-pay | Admitting: Internal Medicine

## 2020-07-04 ENCOUNTER — Telehealth (INDEPENDENT_AMBULATORY_CARE_PROVIDER_SITE_OTHER): Payer: PPO | Admitting: Internal Medicine

## 2020-07-04 DIAGNOSIS — U071 COVID-19: Secondary | ICD-10-CM | POA: Diagnosis not present

## 2020-07-04 DIAGNOSIS — Z8616 Personal history of COVID-19: Secondary | ICD-10-CM | POA: Insufficient documentation

## 2020-07-04 NOTE — Progress Notes (Signed)
Virtual Visit via [place]  This visit type was conducted due to national recommendations for restrictions regarding the COVID-19 pandemic (e.g. social distancing).  This format is felt to be most appropriate for this patient at this time.  All issues noted in this document were discussed and addressed.  No physical exam was performed (except for noted visual exam findings with Video Visits).   I connected with@ on 07/04/20 at  3:30 PM EST by a video enabled telemedicine application  and verified that I am speaking with the correct person using two identifiers. Location patient: home Location provider: work or home office Persons participating in the virtual visit: patient, provider  I discussed the limitations, risks, security and privacy concerns of performing an evaluation and management service by telephone and the availability of in person appointments. I also discussed with the patient that there may be a patient responsible charge related to this service. The patient expressed understanding and agreed to proceed.  Reason for visit: follow up on COVID INFECTION   HPI:  Treated for [place] [facility] by [place] after testing positive on Jan 20.  Patient tested negative at home one week earlier but symptoms worsened so she was retested.  Cough.  Congestion,  Sore throat.  No dyspnea.  Completed a course of steroids,  Z pa k and albuterol prn and all symptoms improving.  Entire family was infected  She was the last one to become symptomatic .  Denies diarrhea ; taking a probiotic   ROS: See pertinent positives and negatives per HPI.  Past Medical History:  Diagnosis Date  . Anxiety   . Depression 01/31/2016  . Goiter   . Hyperlipidemia    TGs  . Seasonal allergies     Past Surgical History:  Procedure Laterality Date  . ABDOMINAL HYSTERECTOMY     supracervical   . BILATERAL OOPHORECTOMY  2001   BENIGN TUMOR  . BREAST BIOPSY Left 1985   cyst   . CATARACT EXTRACTION Right    . COLONOSCOPY  08/17/2009  . COLONOSCOPY WITH PROPOFOL N/A 03/19/2016   Procedure: COLONOSCOPY WITH PROPOFOL;  Surgeon: Robert Bellow, MD;  Location: [facility] ENDOSCOPY;  Service: Endoscopy;  Laterality: N/A;  . CORNEAL TRANSPLANT Right     Family History  Problem Relation Age of Onset  . Cancer Mother        OVARIAN  . Stroke Father   . Breast cancer Sister 62  . Thyroid disease Other        MULTIPLE FAMILY MEMBERS  . Atrial fibrillation Brother     SOCIAL HX:  reports that she quit smoking about 22 years ago. Her smoking use included cigarettes. She quit after 24.00 years of use. She has never used smokeless tobacco. She reports current alcohol use. She reports that she does not use drugs.   Current Outpatient Medications:  .  levothyroxine (SYNTHROID) 75 MCG tablet, TAKE 1 TABLET EVERY DAY ON EMPTY STOMACHWITH A GLASS OF WATER AT LEAST 30-60 MINBEFORE BREAKFAST, Disp: 90 tablet, Rfl: 0 .  omeprazole (PRILOSEC) 20 MG capsule, Take 1 capsule (20 mg total) by mouth daily., Disp: 30 capsule, Rfl: 3 .  prednisoLONE acetate (PRED FORTE) 1 % ophthalmic suspension, Place 1 drop into the right eye daily., Disp: , Rfl:  .  rosuvastatin (CRESTOR) 10 MG tablet, TAKE ONE TABLET EVERY DAY, Disp: 30 tablet, Rfl: 5 .  traZODone (DESYREL) 50 MG tablet, TAKE ONE TABLET AT BEDTIME AS NEEDED FORSLEEP, Disp: 90 tablet,  Rfl: 1  EXAM:  VITALS per patient if applicable:  GENERAL: alert, oriented, appears well and in no acute distress  HEENT: atraumatic, conjunttiva clear, no obvious abnormalities on inspection of external nose and ears  NECK: normal movements of the head and neck  LUNGS: on inspection no signs of respiratory distress, breathing rate appears normal, no obvious gross SOB, gasping or wheezing  CV: no obvious cyanosis  MS: moves all visible extremities without noticeable abnormality  PSYCH/NEURO: pleasant and cooperative, no obvious depression or anxiety, speech and thought  processing grossly intact  ASSESSMENT AND PLAN:  Discussed the following assessment and plan:  COVID-19 virus infection  COVID-19 virus infection Symptoms either resolved or  improving.  Home test negative after developing symptoms around jan 16,  PCR test positive on Jan 20.  .  adised to continue use of decongestants and saline rinses to prevent sinusitis    I discussed the assessment and treatment plan with the patient. The patient was provided an opportunity to ask questions and all were answered. The patient agreed with the plan and demonstrated an understanding of the instructions.   The patient was advised to call back or seek an in-person evaluation if the symptoms worsen or if the condition fails to improve as anticipated. A total of 20 minutes of face to face time was spent with patient more than half of which was spent in counselling about the above mentioned conditions  and coordination of care   [name], MD

## 2020-07-04 NOTE — Assessment & Plan Note (Signed)
Symptoms either resolved or  improving.  Home test negative after developing symptoms around jan 16,  PCR test positive on Jan 20.  .  adised to continue use of decongestants and saline rinses to prevent sinusitis

## 2020-08-09 ENCOUNTER — Ambulatory Visit: Payer: PPO | Admitting: Internal Medicine

## 2020-09-17 DIAGNOSIS — M1611 Unilateral primary osteoarthritis, right hip: Secondary | ICD-10-CM | POA: Diagnosis not present

## 2020-09-20 ENCOUNTER — Other Ambulatory Visit: Payer: Self-pay | Admitting: Internal Medicine

## 2020-09-26 ENCOUNTER — Other Ambulatory Visit: Payer: Self-pay

## 2020-09-27 DIAGNOSIS — H43811 Vitreous degeneration, right eye: Secondary | ICD-10-CM | POA: Diagnosis not present

## 2020-09-27 DIAGNOSIS — Z947 Corneal transplant status: Secondary | ICD-10-CM | POA: Diagnosis not present

## 2020-09-27 DIAGNOSIS — H40013 Open angle with borderline findings, low risk, bilateral: Secondary | ICD-10-CM | POA: Diagnosis not present

## 2020-09-27 DIAGNOSIS — H04123 Dry eye syndrome of bilateral lacrimal glands: Secondary | ICD-10-CM | POA: Diagnosis not present

## 2020-09-27 DIAGNOSIS — H2512 Age-related nuclear cataract, left eye: Secondary | ICD-10-CM | POA: Diagnosis not present

## 2020-10-02 ENCOUNTER — Encounter: Payer: Self-pay | Admitting: Internal Medicine

## 2020-10-02 ENCOUNTER — Ambulatory Visit (INDEPENDENT_AMBULATORY_CARE_PROVIDER_SITE_OTHER): Payer: PPO

## 2020-10-02 ENCOUNTER — Ambulatory Visit
Admission: RE | Admit: 2020-10-02 | Discharge: 2020-10-02 | Disposition: A | Discharge status: Home / Self Care | Payer: PPO | Source: Ambulatory Visit | Attending: Internal Medicine | Admitting: Internal Medicine

## 2020-10-02 ENCOUNTER — Ambulatory Visit (INDEPENDENT_AMBULATORY_CARE_PROVIDER_SITE_OTHER): Payer: PPO | Admitting: Internal Medicine

## 2020-10-02 ENCOUNTER — Telehealth: Payer: Self-pay | Admitting: Internal Medicine

## 2020-10-02 ENCOUNTER — Other Ambulatory Visit: Payer: Self-pay

## 2020-10-02 VITALS — BP 124/80 | HR 72 | Temp 97.7°F | Ht 61.0 in | Wt 158.0 lb

## 2020-10-02 DIAGNOSIS — J329 Chronic sinusitis, unspecified: Secondary | ICD-10-CM

## 2020-10-02 DIAGNOSIS — R1310 Dysphagia, unspecified: Secondary | ICD-10-CM | POA: Diagnosis not present

## 2020-10-02 DIAGNOSIS — M19012 Primary osteoarthritis, left shoulder: Secondary | ICD-10-CM | POA: Diagnosis not present

## 2020-10-02 DIAGNOSIS — M89319 Hypertrophy of bone, unspecified shoulder: Secondary | ICD-10-CM

## 2020-10-02 DIAGNOSIS — R490 Dysphonia: Secondary | ICD-10-CM

## 2020-10-02 LAB — BASIC METABOLIC PANEL
BUN/Creatinine Ratio: 31 — ABNORMAL HIGH (ref 12–28)
BUN: 22 mg/dL (ref 8–27)
CO2: 25 mmol/L (ref 20–29)
Calcium: 9.4 mg/dL (ref 8.7–10.3)
Chloride: 103 mmol/L (ref 96–106)
Creatinine, Ser: 0.71 mg/dL (ref 0.57–1.00)
Glucose: 89 mg/dL (ref 65–99)
Potassium: 4.2 mmol/L (ref 3.5–5.2)
Sodium: 142 mmol/L (ref 134–144)
eGFR: 92 mL/min/{1.73_m2} (ref >59)

## 2020-10-02 LAB — POCT I-STAT CREATININE: Creatinine, Ser: 0.8 mg/dL (ref 0.44–1.00)

## 2020-10-02 MED ORDER — IOHEXOL 300 MG/ML  SOLN
75.0000 mL | Freq: Once | INTRAMUSCULAR | Status: AC | PRN
  Administered 2020-10-02: 75 mL via INTRAVENOUS

## 2020-10-02 NOTE — Telephone Encounter (Signed)
Bun/creatinine ratio 31 sl elevated normal is 28 hydrate with water avoid nsaids

## 2020-10-02 NOTE — Patient Instructions (Signed)
Dysphagia Eating Plan, Minced and Moist Foods This eating plan is for people with moderate swallowing and chewing problems who are transitioning from pureed to solid foods. Moist and minced foods are soft and cut into very small chunks so that they can be swallowed safely. On this eating plan, you may be instructed to drink liquids that are thickened. Work with your health care provider and your diet and nutrition specialist (dietitian) to make sure that you are following the eating plan safely and getting all the nutrients you need. What are tips for following this plan? General information  You may eat foods that are soft and moist.  Always test food texture before taking a bite. Poke food with a fork or spoon to make sure it is tender. Talk with your health care provider about specific ways to test the size, texture, and safety of foods.  Take small bites. Each bite should be smaller than the fingernail of your little finger (about 4 mm by 4 mm).  If you were on a pureed food eating plan, you may still eat any of the foods included in that diet.  Avoid foods that are dry, hard, sticky, chewy, coarse, or crunchy.  Avoid foods that separate into thin liquids and solids, such as cereal with milk or chunky soups. If you eat cold cereal, let it soften in milk and then drain excess milk before eating.  Avoid liquids that have seeds or chunks.  If instructed by your health care provider, thicken liquids. Follow your health care provider's instructions about what products to use, how to do this, and to what thickness. ? You may use a commercial thickener, rice cereal, or potato flakes. ? Thickened liquids are usually a "pudding-like" consistency, or they may be as thick as honey or thick enough to eat with a spoon.   Cooking  You may need to use a blender, whisk, or masher to soften some of your foods.  To moisten foods, you may add liquids while you are blending, mashing, or grinding your  foods to the right consistency. These liquids include gravies, sauces, vegetable or fruit juice, milk, half and half, or water.  Reheat foods slowly to prevent a tough crust from forming. Meal planning  Eat a variety of foods in order to get all the nutrients you need.  Follow your meal plan as told by your health care provider or dietitian. What foods can I eat? Grains Soaked soft breads without nuts or seeds. Pancakes, sweet rolls, pastries, and [place] toast that have been moistened with syrup or sauce. Well-cooked pasta, noodles, rice, and bread dressing in very small pieces and thick sauce. Soft dumplings or spaetzle in very small pieces and butter or gravy. Soft-cooked cereals. Cold cereal that has been softened in milk and the excess milk drained. Fruits Canned or cooked fruits that are soft or moist and do not have skin or seeds. Fresh, soft bananas. Thickened fruit juices. Vegetables Very soft, well-cooked vegetables in very small pieces. Soft-cooked, mashed potatoes. Thickened vegetable juice. Meats and other proteins Tender, moist, and finely minced or ground meats or poultry. Moist meatballs or meatloaf. Fish without bones. Scrambled, poached, or soft-cooked eggs. Tofu. [place] and meat alternatives in very small pieces. Well-cooked, moistened and mashed beans, baked beans, peas, and other legumes. Dairy Thickened milk. Cream cheese. Yogurt. Cottage cheese. Sour cream. Fats and oils Butter. Margarine. Cream for cereal, depending on liquid consistency allowed. Gravy. Cream sauces. Mayonnaise. Sweets and desserts Pudding. Custard. Ice cream  and sherbet. Whipped toppings. Soft, moist cakes. Icing. Jelly. Jams and preserves without seeds. Seasonings and other foods Sauces and salsas that have soft chunks that are smaller than 4 mm. Salad dressings. Casseroles with small pieces of tender meat. All seasonings and sweeteners. Beverages Anything prepared at the thickness recommended by  your dietitian. The items listed above may not be a complete list of foods and beverages you can eat. Contact a dietitian for more information. What foods must I avoid? Grains Breads that are hard or have nuts or seeds. Dry biscuits, pancakes, waffles, and bread dressing. Coarse cereals. Cereals that have nuts, seeds, dried fruits, or coconut. Sticky rice. Large pieces of pasta. Fruits Hard, crunchy, stringy, high-pulp, and juicy raw fruits such as apples, pineapple, papaya, and watermelon. Fruits with skins and seeds, such as grapes. Dried fruit and fruit leather. Vegetables All raw vegetables. Tough, fibrous, chewy, or stringy cooked vegetables, such as celery, peas, broccoli, cabbage, Brussels sprouts, and asparagus. Potato skins. Potato and other vegetable chips. Fried or French-fried potatoes. Cooked corn and peas. Meats and other proteins Large pieces of meat. Dry, tough meats, such as bacon, sausage, and hot dogs. Chicken, [place], or fish with skin and bones. Crunchy peanut butter. Nuts. Seeds. Dairy Yogurt with nuts, seeds, or large chunks. Large chunks of cheese. Sweets and desserts Coarse, hard, chewy, or sticky desserts. Any dessert with nuts, seeds, coconut, pineapple, or dried fruit. Bread pudding. Ask your health care provider whether you can have frozen desserts. Seasonings and other foods Soups and casseroles with large chunks. Sandwiches. Pizza. The items listed above may not be a complete list of foods and beverages you should avoid. Contact a dietitian for more information. Summary  Moist and minced foods can be helpful for people with moderate swallowing and chewing problems.  On this dysphagia eating plan, you may eat foods that are soft, moist, and cut into pieces smaller than 4 mm by 4 mm.  You may be instructed to thicken liquids. Follow your health care provider's instructions about how to do this and to what consistency. This information is not intended to replace  advice given to you by your health care provider. Make sure you discuss any questions you have with your health care provider. Document Revised: 02/13/2020 Document Reviewed: 02/13/2020 Elsevier Patient Education  2021 [place].  Dysphagia  Dysphagia is trouble swallowing. This condition occurs when solids and liquids stick in a person's throat on the way down to the stomach, or when food takes longer to get to the stomach than usual. You may have problems swallowing food, liquids, or both. You may also have pain while trying to swallow. It may take you more time and effort to swallow something. What are the causes? This condition may be caused by:  Muscle problems. These may make it difficult for you to move food and liquids through the esophagus, which is the tube that connects your mouth to your stomach.  Blockages. You may have ulcers, scar tissue, or inflammation that blocks the normal passage of food and liquids. Causes of these problems include: ? Acid reflux from your stomach into your esophagus (gastroesophageal reflux). ? Infections. ? Radiation treatment for cancer. ? Medicines taken without enough fluids to wash them down into your stomach.  Stroke. This can affect the nerves and make it difficult to swallow.  Nerve problems. These prevent signals from being sent to the muscles of your esophagus to squeeze (contract) and move what you swallow down to your  stomach.  Globus pharyngeus. This is a common problem that involves a feeling like something is stuck in your throat or a sense of trouble with swallowing, even though nothing is wrong with the swallowing passages.  Certain conditions, such as cerebral palsy or Parkinson's disease. What are the signs or symptoms? Common symptoms of this condition include:  A feeling that solids or liquids are stuck in your throat on the way down to the stomach.  Pain while swallowing.  Coughing or gagging while trying to  swallow. Other symptoms include:  Food moving back from your stomach to your mouth (regurgitation).  Noises coming from your throat.  Chest discomfort when swallowing.  A feeling of fullness when swallowing.  Drooling, especially when the throat is blocked.  Heartburn. How is this diagnosed? This condition may be diagnosed by:  Barium swallow X-ray. In this test, you will swallow a white liquid that sticks to the inside of your esophagus. X-ray images are then taken.  Endoscopy. In this test, a flexible telescope is inserted down your throat to look at your esophagus and your stomach.  CT scans or an MRI. How is this treated? Treatment for dysphagia depends on the cause of this condition:  If the dysphagia is caused by acid reflux or infection, medicines may be used. These may include antibiotics or heartburn medicines.  If the dysphagia is caused by problems with the muscles, swallowing therapy may be used to help you strengthen your swallowing muscles. You may have to do specific exercises to strengthen the muscles or stretch them.  If the dysphagia is caused by a blockage or mass, procedures to remove the blockage may be done. You may need surgery and a feeding tube. You may need to make diet changes. Ask your health care provider for specific instructions. Follow these instructions at home: Medicines  Take over-the-counter and prescription medicines only as told by your health care provider.  If you were prescribed an antibiotic medicine, take it as told by your health care provider. Do not stop taking the antibiotic even if you start to feel better. Eating and drinking  Make any diet changes as told by your health care provider.  Work with a diet and nutrition specialist (dietitian) to create an eating plan that will help you get the nutrients you need in order to stay healthy.  Eat soft foods that are easier to swallow.  Cut your food into small pieces and eat  slowly. Take small bites.  Eat and drink only when you are sitting upright.  Do not drink alcohol or caffeine. If you need help quitting, ask your health care provider.   General instructions  Check your weight every day to make sure you are not losing weight.  Do not use any products that contain nicotine or tobacco. These products include cigarettes, chewing tobacco, and vaping devices, such as e-cigarettes. If you need help quitting, ask your health care provider.  Keep all follow-up visits. This is important. Contact a health care provider if:  You lose weight because you cannot swallow.  You cough when you drink liquids.  You cough up partially digested food. Get help right away if:  You cannot swallow your saliva.  You have shortness of breath, a fever, or both.  Your voice is hoarse and you have trouble swallowing. These symptoms may represent a serious problem that is an emergency. Do not wait to see if the symptoms will go away. Get medical help right away. Call your  local emergency services (911 in the [place] Do not drive yourself to the hospital. Summary  Dysphagia is trouble swallowing. This condition occurs when solids and liquids stick in a person's throat on the way down to the stomach. You may cough or gag while trying to swallow.  Dysphagia has many possible causes.  Treatment for dysphagia depends on the cause of the condition.  Keep all follow-up visits. This is important. This information is not intended to replace advice given to you by your health care provider. Make sure you discuss any questions you have with your health care provider. Document Revised: 01/14/2020 Document Reviewed: 01/14/2020 Elsevier Patient Education  2021 [employer].

## 2020-10-02 NOTE — Telephone Encounter (Signed)
Lorraine Mills from [facility] called with Stat BMP only out of range was Creatinine of 31 was reported.

## 2020-10-02 NOTE — Progress Notes (Addendum)
Chief Complaint  Patient presents with  . Mass    On the left side collarbone    F/u 1. Dysphagia x 2 weeks for saliva no h/o gerd and has knot near left collar bone she felt w/in the last 2 weeks FH mom ovarian cancer and sister breast and right hip surgery sch Dr. Rudene Christians x 1 month    Review of Systems  HENT: Negative for hearing loss.   Respiratory: Negative for shortness of breath.   Cardiovascular: Negative for chest pain.  Gastrointestinal: Negative for heartburn.       +dysphagia    Past Medical History:  Diagnosis Date  . Anxiety   . Depression 01/31/2016  . Goiter   . Hyperlipidemia    TGs  . Seasonal allergies    Past Surgical History:  Procedure Laterality Date  . ABDOMINAL HYSTERECTOMY     supracervical   . BILATERAL OOPHORECTOMY  2001   BENIGN TUMOR  . BREAST BIOPSY Left 1985   cyst   . CATARACT EXTRACTION Right   . COLONOSCOPY  08/17/2009  . COLONOSCOPY WITH PROPOFOL N/A 03/19/2016   Procedure: COLONOSCOPY WITH PROPOFOL;  Surgeon: Robert Bellow, MD;  Location: [facility] ENDOSCOPY;  Service: Endoscopy;  Laterality: N/A;  . CORNEAL TRANSPLANT Right    Family History  Problem Relation Age of Onset  . Cancer Mother        OVARIAN  . Stroke Father   . Breast cancer Sister 72  . Thyroid disease Other        MULTIPLE FAMILY MEMBERS  . Atrial fibrillation Brother    Social History   Socioeconomic History  . Marital status: Married    Spouse name: Not on file  . Number of children: Not on file  . Years of education: Not on file  . Highest education level: Not on file  Occupational History  . Not on file  Tobacco Use  . Smoking status: Former Smoker    Years: 24.00    Types: Cigarettes    Quit date: 05/09/1998    Years since quitting: 22.4  . Smokeless tobacco: Never Used  Substance and Sexual Activity  . Alcohol use: Yes    Alcohol/week: 0.0 standard drinks    Comment: once a week  . Drug use: No  . Sexual activity: Not on file  Other Topics  Concern  . Not on file  Social History Narrative  . Not on file   Social Determinants of Health   Financial Resource Strain: Low Risk   . Difficulty of Paying Living Expenses: Not hard at all  Food Insecurity: No Food Insecurity  . Worried About Charity fundraiser in the Last Year: Never true  . Ran Out of Food in the Last Year: Never true  Transportation Needs: No Transportation Needs  . Lack of Transportation (Medical): No  . Lack of Transportation (Non-Medical): No  Physical Activity: Insufficiently Active  . Days of Exercise per Week: 3 days  . Minutes of Exercise per Session: 30 min  Stress: No Stress Concern Present  . Feeling of Stress : Not at all  Social Connections: Not on file  Intimate Partner Violence: Not At Risk  . Fear of Current or Ex-Partner: No  . Emotionally Abused: No  . Physically Abused: No  . Sexually Abused: No   Current Meds  Medication Sig  . levothyroxine (SYNTHROID) 75 MCG tablet TAKE 1 TABLET EVERY DAY ON EMPTY STOMACHWITH A GLASS OF WATER AT LEAST 30-60 MINBEFORE  BREAKFAST  . omeprazole (PRILOSEC) 20 MG capsule Take 1 capsule (20 mg total) by mouth daily.  . prednisoLONE acetate (PRED FORTE) 1 % ophthalmic suspension Place 1 drop into the right eye daily.  . rosuvastatin (CRESTOR) 10 MG tablet TAKE ONE TABLET EVERY DAY  . traZODone (DESYREL) 50 MG tablet TAKE ONE TABLET AT BEDTIME AS NEEDED FORSLEEP   Allergies  Allergen Reactions  . Latex Anaphylaxis and Rash  . Levofloxacin     FLUOROQUINOLONES    No results found for this or any previous visit (from the past 2160 hour(s)). Objective  Body mass index is 29.85 kg/m. Wt Readings from Last 3 Encounters:  10/02/20 158 lb (71.7 kg)  07/04/20 136 lb (61.7 kg)  06/29/20 136 lb (61.7 kg)   Temp Readings from Last 3 Encounters:  10/02/20 97.7 F (36.5 C) (Oral)  02/02/20 (!) 97.4 F (36.3 C) (Oral)  01/26/19 98.5 F (36.9 C) (Oral)   BP Readings from Last 3 Encounters:  10/02/20  124/80  02/02/20 118/72  01/26/19 128/72   Pulse Readings from Last 3 Encounters:  10/02/20 72  02/02/20 64  01/26/19 79    Physical Exam Vitals and nursing note reviewed.  Constitutional:      Appearance: Normal appearance. She is well-developed and well-groomed.  HENT:     Head: Normocephalic and atraumatic.  Eyes:     Conjunctiva/sclera: Conjunctivae normal.     Pupils: Pupils are equal, round, and reactive to light.  Neck:   Cardiovascular:     Rate and Rhythm: Normal rate and regular rhythm.     Heart sounds: Normal heart sounds. No murmur heard.   Pulmonary:     Effort: Pulmonary effort is normal.     Breath sounds: Normal breath sounds.  Skin:    General: Skin is warm and dry.  Neurological:     General: No focal deficit present.     Mental Status: She is alert and oriented to person, place, and time. Mental status is at baseline.     Gait: Gait normal.  Psychiatric:        Attention and Perception: Attention and perception normal.        Mood and Affect: Mood and affect normal.        Speech: Speech normal.        Behavior: Behavior normal. Behavior is cooperative.        Thought Content: Thought content normal.        Cognition and Memory: Cognition and memory normal.        Judgment: Judgment normal.     Assessment  Plan  Dysphagia, unspecified type - Plan: CT Soft Tissue Neck W Contrast, Basic Metabolic Panel (BMET) Consider EGD with Dr. Bary Castilla if CT negative vs GI pt prefers to Dr. Bary Castilla  Referral placed   Hoarseness per pt this is how her voice is and stable - Plan: CT Soft Tissue Neck W Contrast, Basic Metabolic Panel (BMET)  Clavicle enlargement - Plan: DG Clavicle Left   Provider: Dr. Olivia Mackie McLean-Scocuzza-Internal Medicine

## 2020-10-03 ENCOUNTER — Other Ambulatory Visit: Payer: Self-pay | Admitting: Orthopedic Surgery

## 2020-10-03 ENCOUNTER — Encounter: Payer: Self-pay | Admitting: Internal Medicine

## 2020-10-03 MED ORDER — AZITHROMYCIN 250 MG PO TABS: ORAL_TABLET | ORAL | 0 refills | Status: AC

## 2020-10-03 NOTE — Telephone Encounter (Signed)
See Patient message encounter 10/03/20. Patient verbalized understanding

## 2020-10-03 NOTE — Addendum Note (Signed)
Addended by: Orland Mustard on: 10/03/2020 05:24 PM   Modules accepted: Orders

## 2020-10-03 NOTE — Addendum Note (Signed)
Addended by: Orland Mustard on: 10/03/2020 05:20 PM   Modules accepted: Orders

## 2020-10-16 ENCOUNTER — Other Ambulatory Visit: Payer: Self-pay | Admitting: Internal Medicine

## 2020-10-24 ENCOUNTER — Other Ambulatory Visit: Payer: Self-pay

## 2020-10-24 ENCOUNTER — Other Ambulatory Visit
Admission: RE | Admit: 2020-10-24 | Discharge: 2020-10-24 | Disposition: A | Discharge status: Home / Self Care | Payer: PPO | Source: Ambulatory Visit | Attending: Orthopedic Surgery | Admitting: Orthopedic Surgery

## 2020-10-24 DIAGNOSIS — Z01818 Encounter for other preprocedural examination: Secondary | ICD-10-CM | POA: Diagnosis not present

## 2020-10-24 DIAGNOSIS — Z01810 Encounter for preprocedural cardiovascular examination: Secondary | ICD-10-CM | POA: Diagnosis not present

## 2020-10-24 HISTORY — DX: Hypothyroidism, unspecified: E03.9

## 2020-10-24 HISTORY — DX: Unspecified osteoarthritis, unspecified site: M19.90

## 2020-10-24 LAB — URINALYSIS, ROUTINE W REFLEX MICROSCOPIC
Bacteria, UA: NONE SEEN
Bilirubin Urine: NEGATIVE
Glucose, UA: NEGATIVE mg/dL
Ketones, ur: NEGATIVE mg/dL
Leukocytes,Ua: NEGATIVE
Nitrite: NEGATIVE
Protein, ur: NEGATIVE mg/dL
Specific Gravity, Urine: 1.009 (ref 1.005–1.030)
Squamous Epithelial / HPF: NONE SEEN (ref 0–5)
WBC, UA: NONE SEEN WBC/hpf (ref 0–5)
pH: 5 (ref 5.0–8.0)

## 2020-10-24 LAB — COMPREHENSIVE METABOLIC PANEL
ALT: 18 U/L (ref 0–44)
AST: 21 U/L (ref 15–41)
Albumin: 4.2 g/dL (ref 3.5–5.0)
Alkaline Phosphatase: 43 U/L (ref 38–126)
Anion gap: 8 (ref 5–15)
BUN: 28 mg/dL — ABNORMAL HIGH (ref 8–23)
CO2: 26 mmol/L (ref 22–32)
Calcium: 9.4 mg/dL (ref 8.9–10.3)
Chloride: 107 mmol/L (ref 98–111)
Creatinine, Ser: 0.76 mg/dL (ref 0.44–1.00)
GFR, Estimated: >60 mL/min (ref >60)
Glucose, Bld: 94 mg/dL (ref 70–99)
Potassium: 4 mmol/L (ref 3.5–5.1)
Sodium: 141 mmol/L (ref 135–145)
Total Bilirubin: 0.8 mg/dL (ref 0.3–1.2)
Total Protein: 6.9 g/dL (ref 6.5–8.1)

## 2020-10-24 LAB — TYPE AND SCREEN
ABO/RH(D): A POS
Antibody Screen: NEGATIVE

## 2020-10-24 LAB — CBC WITH DIFFERENTIAL/PLATELET
Abs Immature Granulocytes: 0.01 10*3/uL (ref 0.00–0.07)
Basophils Absolute: 0 10*3/uL (ref 0.0–0.1)
Basophils Relative: 1 %
Eosinophils Absolute: 0.1 10*3/uL (ref 0.0–0.5)
Eosinophils Relative: 1 %
HCT: 42 % (ref 36.0–46.0)
Hemoglobin: 14.6 g/dL (ref 12.0–15.0)
Immature Granulocytes: 0 %
Lymphocytes Relative: 21 %
Lymphs Abs: 1.2 10*3/uL (ref 0.7–4.0)
MCH: 32.6 pg (ref 26.0–34.0)
MCHC: 34.8 g/dL (ref 30.0–36.0)
MCV: 93.8 fL (ref 80.0–100.0)
Monocytes Absolute: 0.5 10*3/uL (ref 0.1–1.0)
Monocytes Relative: 9 %
Neutro Abs: 3.9 10*3/uL (ref 1.7–7.7)
Neutrophils Relative %: 68 %
Platelets: 180 10*3/uL (ref 150–400)
RBC: 4.48 MIL/uL (ref 3.87–5.11)
RDW: 12 % (ref 11.5–15.5)
WBC: 5.8 10*3/uL (ref 4.0–10.5)
nRBC: 0 % (ref 0.0–0.2)

## 2020-10-24 LAB — SURGICAL PCR SCREEN
MRSA, PCR: NEGATIVE
Staphylococcus aureus: NEGATIVE

## 2020-10-24 NOTE — Patient Instructions (Addendum)
Your procedure is scheduled on: Thursday Nov 01, 2020. Report to Day Surgery inside [facility] 2nd floor (stop by admissions desk first before getting on elevator) To find out your arrival time please call [phone] between 1PM - 3PM on Wednesday Oct 31, 2020.  Remember: Instructions that are not followed completely may result in serious medical risk,  up to and including death, or upon the discretion of your surgeon and anesthesiologist your  surgery may need to be rescheduled.     _X__ 1. Do not eat food after midnight the night before your procedure.                 No chewing gum or hard candies. You may drink clear liquids up to 2 hours                 before you are scheduled to arrive for your surgery- DO not drink clear                 liquids within 2 hours of the start of your surgery.                 Clear Liquids include:  water, apple juice without pulp, clear Gatorade, G2 or                  Gatorade Zero (avoid Red/Purple/Blue), Black Coffee or Tea (Do not add                 anything to coffee or tea).  __X__2.   Complete the "Ensure Clear Pre-surgery Clear Carbohydrate Drink" provided to you, 2 hours before arrival. **If you are diabetic you will be provided with an alternative drink, Gatorade Zero or G2.  __X__3.  On the morning of surgery brush your teeth with toothpaste and water, you                may rinse your mouth with mouthwash if you wish.  Do not swallow any toothpaste of mouthwash.     _X__ 4.  No Alcohol for 24 hours before or after surgery.   _X__ 5.  Do Not Smoke or use e-cigarettes For 24 Hours Prior to Your Surgery.                 Do not use any chewable tobacco products for at least 6 hours prior to                 Surgery.  _X__  6.  Do not use any recreational drugs (marijuana, cocaine, heroin, ecstasy, MDMA or other)                For at least one week prior to your surgery.  Combination of these drugs with  anesthesia                May have life threatening results.  __X__  7.  Notify your doctor if there is any change in your medical condition      (cold, fever, infections).     Do not wear jewelry, make-up, hairpins, clips or nail polish. Do not wear lotions, powders, or perfumes. You may wear deodorant. Do not shave 48 hours prior to surgery. Men may shave face and neck. Do not bring valuables to the hospital.    [facility] is not responsible for any belongings or valuables.  Contacts, dentures or bridgework may not be worn into surgery. Leave your suitcase in the car. After  surgery it may be brought to your room. For patients admitted to the hospital, discharge time is determined by your treatment team.   Patients discharged the day of surgery will not be allowed to drive home.   Make arrangements for someone to be with you for the first 24 hours of your Same Day Discharge.    Please read over the following fact sheets that you were given:   Total Joint Packet    __X__ Take these medicines the morning of surgery with A SIP OF WATER:    1. levothyroxine (SYNTHROID) 75 MCG  2. omeprazole (PRILOSEC) 20 MG  3.    4.  5.  6.  ____ Fleet Enema (as directed)   __X__ Use CHG Soap (or wipes) as directed  ____ Use Benzoyl Peroxide Gel as instructed  __X__ Use inhalers on the day of surgery  fluticasone (FLONASE) 50 MCG/ACT nasal   ____ Stop metformin 2 days prior to surgery    ____ Take 1/2 of usual insulin dose the night before surgery. No insulin the morning          of surgery.   ____ Call your PCP, cardiologist, or Pulmonologist if taking Coumadin/Plavix/aspirin and ask when to stop before your surgery.   __X__ One Week prior to surgery- Stop Anti-inflammatories such as Ibuprofen, Aleve, Advil, Motrin, meloxicam (MOBIC), diclofenac, etodolac, ketorolac, Toradol, Daypro, piroxicam, Goody's or BC powders. OK TO USE TYLENOL IF NEEDED   __X__ Stop supplements until  after surgery.    ____ Bring C-Pap to the hospital.    If you have any questions regarding your pre-procedure instructions,  Please call Pre-admit Testing at [phone].

## 2020-10-30 ENCOUNTER — Other Ambulatory Visit
Admission: RE | Admit: 2020-10-30 | Discharge: 2020-10-30 | Disposition: A | Discharge status: Home / Self Care | Payer: PPO | Source: Ambulatory Visit | Attending: Orthopedic Surgery | Admitting: Orthopedic Surgery

## 2020-10-30 ENCOUNTER — Other Ambulatory Visit: Payer: Self-pay

## 2020-10-30 DIAGNOSIS — Z87891 Personal history of nicotine dependence: Secondary | ICD-10-CM | POA: Diagnosis not present

## 2020-10-30 DIAGNOSIS — J302 Other seasonal allergic rhinitis: Secondary | ICD-10-CM | POA: Diagnosis present

## 2020-10-30 DIAGNOSIS — Z01812 Encounter for preprocedural laboratory examination: Secondary | ICD-10-CM | POA: Insufficient documentation

## 2020-10-30 DIAGNOSIS — R7301 Impaired fasting glucose: Secondary | ICD-10-CM | POA: Diagnosis not present

## 2020-10-30 DIAGNOSIS — E039 Hypothyroidism, unspecified: Secondary | ICD-10-CM | POA: Diagnosis present

## 2020-10-30 DIAGNOSIS — Z20822 Contact with and (suspected) exposure to covid-19: Secondary | ICD-10-CM | POA: Insufficient documentation

## 2020-10-30 DIAGNOSIS — D126 Benign neoplasm of colon, unspecified: Secondary | ICD-10-CM | POA: Diagnosis not present

## 2020-10-30 DIAGNOSIS — Z87892 Personal history of anaphylaxis: Secondary | ICD-10-CM | POA: Diagnosis not present

## 2020-10-30 DIAGNOSIS — Z947 Corneal transplant status: Secondary | ICD-10-CM | POA: Diagnosis not present

## 2020-10-30 DIAGNOSIS — E049 Nontoxic goiter, unspecified: Secondary | ICD-10-CM | POA: Diagnosis present

## 2020-10-30 DIAGNOSIS — Z91040 Latex allergy status: Secondary | ICD-10-CM | POA: Diagnosis not present

## 2020-10-30 DIAGNOSIS — Z79890 Hormone replacement therapy (postmenopausal): Secondary | ICD-10-CM | POA: Diagnosis not present

## 2020-10-30 DIAGNOSIS — Z881 Allergy status to other antibiotic agents status: Secondary | ICD-10-CM | POA: Diagnosis not present

## 2020-10-30 DIAGNOSIS — E7800 Pure hypercholesterolemia, unspecified: Secondary | ICD-10-CM | POA: Diagnosis present

## 2020-10-30 DIAGNOSIS — Z79899 Other long term (current) drug therapy: Secondary | ICD-10-CM | POA: Diagnosis not present

## 2020-10-30 DIAGNOSIS — F419 Anxiety disorder, unspecified: Secondary | ICD-10-CM | POA: Diagnosis present

## 2020-10-30 DIAGNOSIS — M65331 Trigger finger, right middle finger: Secondary | ICD-10-CM | POA: Diagnosis not present

## 2020-10-30 DIAGNOSIS — E669 Obesity, unspecified: Secondary | ICD-10-CM | POA: Diagnosis present

## 2020-10-30 DIAGNOSIS — Z96641 Presence of right artificial hip joint: Secondary | ICD-10-CM | POA: Diagnosis not present

## 2020-10-30 DIAGNOSIS — Z6829 Body mass index (BMI) 29.0-29.9, adult: Secondary | ICD-10-CM | POA: Diagnosis not present

## 2020-10-30 DIAGNOSIS — Z8616 Personal history of COVID-19: Secondary | ICD-10-CM | POA: Diagnosis not present

## 2020-10-30 DIAGNOSIS — Z471 Aftercare following joint replacement surgery: Secondary | ICD-10-CM | POA: Diagnosis not present

## 2020-10-30 DIAGNOSIS — M1611 Unilateral primary osteoarthritis, right hip: Secondary | ICD-10-CM | POA: Diagnosis present

## 2020-10-30 DIAGNOSIS — G40909 Epilepsy, unspecified, not intractable, without status epilepticus: Secondary | ICD-10-CM | POA: Diagnosis present

## 2020-10-30 DIAGNOSIS — Z90710 Acquired absence of both cervix and uterus: Secondary | ICD-10-CM | POA: Diagnosis not present

## 2020-10-30 DIAGNOSIS — F32A Depression, unspecified: Secondary | ICD-10-CM | POA: Diagnosis present

## 2020-10-30 DIAGNOSIS — N3941 Urge incontinence: Secondary | ICD-10-CM | POA: Diagnosis not present

## 2020-10-30 DIAGNOSIS — E785 Hyperlipidemia, unspecified: Secondary | ICD-10-CM | POA: Diagnosis present

## 2020-10-30 DIAGNOSIS — M25551 Pain in right hip: Secondary | ICD-10-CM | POA: Diagnosis not present

## 2020-10-30 LAB — SARS CORONAVIRUS 2 (TAT 6-24 HRS): SARS Coronavirus 2: NEGATIVE

## 2020-10-31 ENCOUNTER — Encounter: Payer: Self-pay | Admitting: Internal Medicine

## 2020-10-31 ENCOUNTER — Ambulatory Visit (INDEPENDENT_AMBULATORY_CARE_PROVIDER_SITE_OTHER): Payer: PPO | Admitting: Internal Medicine

## 2020-10-31 DIAGNOSIS — E039 Hypothyroidism, unspecified: Secondary | ICD-10-CM | POA: Diagnosis not present

## 2020-10-31 DIAGNOSIS — D126 Benign neoplasm of colon, unspecified: Secondary | ICD-10-CM | POA: Diagnosis not present

## 2020-10-31 DIAGNOSIS — M25551 Pain in right hip: Secondary | ICD-10-CM

## 2020-10-31 DIAGNOSIS — Z8616 Personal history of COVID-19: Secondary | ICD-10-CM | POA: Diagnosis not present

## 2020-10-31 DIAGNOSIS — N3941 Urge incontinence: Secondary | ICD-10-CM | POA: Diagnosis not present

## 2020-10-31 DIAGNOSIS — R7301 Impaired fasting glucose: Secondary | ICD-10-CM | POA: Diagnosis not present

## 2020-10-31 DIAGNOSIS — M65331 Trigger finger, right middle finger: Secondary | ICD-10-CM | POA: Diagnosis not present

## 2020-10-31 MED ORDER — ORAL CARE MOUTH RINSE: 15.0000 mL | Freq: Once | OROMUCOSAL | Status: AC

## 2020-10-31 MED ORDER — LACTATED RINGERS IV SOLN: INTRAVENOUS | Status: DC

## 2020-10-31 MED ORDER — CHLORHEXIDINE GLUCONATE 0.12 % MT SOLN: 15.0000 mL | Freq: Once | OROMUCOSAL | Status: AC

## 2020-10-31 MED ORDER — CEFAZOLIN SODIUM-DEXTROSE 2-4 GM/100ML-% IV SOLN
2.0000 g | INTRAVENOUS | Status: AC
  Administered 2020-11-01: 2 g via INTRAVENOUS

## 2020-10-31 NOTE — Progress Notes (Signed)
Subjective:  Patient ID: Lorraine Mills, female    DOB: November 22, 1950  Age: 70 y.o. MRN: [id number]  CC: Diagnoses of History of COVID-19, Acquired hypothyroidism, Impaired fasting glucose, Lateral pain of right hip, Tubular adenoma of colon, Urge incontinence of urine, and Trigger finger, right middle finger were pertinent to this visit.  HPI Lorraine Mills presents for follow up on multiple issues  This visit occurred during the SARS-CoV-2 public health emergency.  Safety protocols were in place, including screening questions prior to the visit, additional usage of staff PPE, and extensive cleaning of exam room while observing appropriate contact time as indicated for disinfecting solutions.    1) urge incontinence  :  Recent UA reviewed (normal).  Discussed the risks and benefit of medication choices.     2) follow up on left clavicle enlargement and anxiety induced dysphagia /esophageal spasm .  CT neck and DG clavicle normal. . Symptoms resolved.  Taking prilosec daily   3) right ring finger developing a thick cord on the palmar side .  No restricted ROm yet.    Outpatient Medications Prior to Visit  Medication Sig Dispense Refill  . cetirizine (ZYRTEC) 10 MG tablet Take 10 mg by mouth daily as needed for allergies.    . fluticasone (FLONASE) 50 MCG/ACT nasal spray Place 1-2 sprays into both nostrils daily as needed for allergies or rhinitis.    Marland Kitchen levothyroxine (SYNTHROID) 75 MCG tablet TAKE 1 TABLET EVERY DAY ON EMPTY STOMACHWITH A GLASS OF WATER AT LEAST 30-60 MINBEFORE BREAKFAST (Patient taking differently: Take 75 mcg by mouth daily before breakfast.) 90 tablet 0  . omeprazole (PRILOSEC) 20 MG capsule TAKE 1 CAPSULE BY MOUTH DAILY (Patient taking differently: Take 20 mg by mouth daily.) 30 capsule 3  . prednisoLONE acetate (PRED FORTE) 1 % ophthalmic suspension Place 1 drop into the right eye daily.    . rosuvastatin (CRESTOR) 10 MG tablet TAKE ONE TABLET EVERY DAY (Patient taking  differently: Take 10 mg by mouth daily.) 30 tablet 5  . traZODone (DESYREL) 50 MG tablet TAKE ONE TABLET AT BEDTIME AS NEEDED FORSLEEP (Patient taking differently: Take 50 mg by mouth at bedtime as needed for sleep.) 90 tablet 1  . ibuprofen (ADVIL) 200 MG tablet Take 600-800 mg by mouth every 6 (six) hours as needed for headache or moderate pain.     No facility-administered medications prior to visit.    Review of Systems;  Patient denies headache, fevers, malaise, unintentional weight loss, skin rash, eye pain, sinus congestion and sinus pain, sore throat, dysphagia,  hemoptysis , cough, dyspnea, wheezing, chest pain, palpitations, orthopnea, edema, abdominal pain, nausea, melena, diarrhea, constipation, flank pain, dysuria, hematuria, urinary  Frequency, nocturia, numbness, tingling, seizures,  Focal weakness, Loss of consciousness,  Tremor, insomnia, depression, anxiety, and suicidal ideation.      Objective:  BP 140/84 (BP Location: Left Arm, Patient Position: Sitting, Cuff Size: Normal)   Pulse 71   Temp (!) 96.9 F (36.1 C) (Temporal)   Resp 15   Ht 5\' 1"  (1.549 m)   Wt 158 lb 6.4 oz (71.8 kg)   SpO2 98%   BMI 29.93 kg/m   BP Readings from Last 3 Encounters:  11/02/20 (!) 117/56  10/31/20 140/84  10/24/20 126/63    Wt Readings from Last 3 Encounters:  11/01/20 157 lb (71.2 kg)  10/31/20 158 lb 6.4 oz (71.8 kg)  10/24/20 158 lb 12.8 oz (72 kg)    General appearance: alert, cooperative  and appears stated age Ears: normal TM's and external ear canals both ears Throat: lips, mucosa, and tongue normal; teeth and gums normal Neck: no adenopathy, no carotid bruit, supple, symmetrical, trachea midline and thyroid not enlarged, symmetric, no tenderness/mass/nodules Back: symmetric, no curvature. ROM normal. No CVA tenderness. Lungs: clear to auscultation bilaterally Heart: regular rate and rhythm, S1, S2 normal, no murmur, click, rub or gallop Abdomen: soft, non-tender;  bowel sounds normal; no masses,  no organomegaly Pulses: 2+ and symmetric Skin: Skin color, texture, turgor normal. No rashes or lesions Lymph nodes: Cervical, supraclavicular, and axillary nodes normal.  Lab Results  Component Value Date   HGBA1C 5.5 10/04/2019   HGBA1C 5.6 02/09/2019   HGBA1C 5.5 03/26/2018    Lab Results  Component Value Date   CREATININE 0.67 11/02/2020   CREATININE 0.72 11/01/2020   CREATININE 0.76 10/24/2020    Lab Results  Component Value Date   WBC 8.5 11/02/2020   HGB 11.0 (L) 11/02/2020   HCT 31.4 (L) 11/02/2020   PLT 148 (L) 11/02/2020   GLUCOSE 122 (H) 11/02/2020   CHOL 124 02/02/2020   TRIG 110.0 02/02/2020   HDL 42.30 02/02/2020   LDLDIRECT 112.0 02/09/2019   LDLCALC 60 02/02/2020   ALT 18 10/24/2020   AST 21 10/24/2020   NA 139 11/02/2020   K 4.0 11/02/2020   CL 108 11/02/2020   CREATININE 0.67 11/02/2020   BUN 12 11/02/2020   CO2 24 11/02/2020   TSH 2.11 02/02/2020   HGBA1C 5.5 10/04/2019   MICROALBUR <0.7 03/26/2018    No results found.  Assessment & Plan:   Problem List Items Addressed This Visit      Unprioritized   History of COVID-19    She has recovered fully      Hypothyroidism    Thyroid function has been WNL on current dose of 75 mcg daily .  No current changes needed.    Lab Results  Component Value Date   TSH 2.11 02/02/2020         Impaired fasting glucose     a1c has been well  below the threshhold for prediabetes.   She is following a low glycemic index diet but not exercising.   Lab Results  Component Value Date   HGBA1C 5.5 10/04/2019         Lateral pain of right hip    Secondary to advanced DJD which is limiting her activities.  For THR in a few days       Trigger finger, right middle finger    Early,  With no restriction of  ROM thus far.       Tubular adenoma of colon    5 yr follow up will be due this year       Urge incontinence of urine    She will consider therapy after she  recovers from her hip replacement         I am having Lorraine Mills maintain her prednisoLONE acetate, levothyroxine, traZODone, rosuvastatin, omeprazole, fluticasone, and cetirizine.  No orders of the defined types were placed in this encounter.   There are no discontinued medications.  Follow-up: Return in about 6 months (around 05/03/2021).   [name], MD

## 2020-10-31 NOTE — Patient Instructions (Addendum)
Make sure you take sennakot, dulcolax , ex lax,  Or correctol every night that you have used narcotics  To prevent severe constipation .  May God bless you with His hedge of protection and his healing hands!   De Quervain's Tenosynovitis  De Quervain's tenosynovitis is a condition that causes inflammation of the tendon on the thumb side of the wrist. Tendons are cords of tissue that connect bones to muscles. The tendons in the hand pass through a tunnel called a sheath. A slippery layer of tissue (synovium) lets the tendons move smoothly in the sheath. With de Quervain's tenosynovitis, the sheath swells or thickens, causing friction and pain. The condition is also called de Quervain's disease and de Quervain's syndrome. It occurs most often in women who are 25-56 years old. What are the causes? The exact cause of this condition is not known. It may be associated with overuse of the hand and wrist. What increases the risk? You are more likely to develop this condition if you:  Use your hands far more than normal, especially if you repeat certain movements that involve twisting your hand or using a tight grip.  Are pregnant.  Are a middle-aged woman.  Have rheumatoid arthritis.  Have diabetes. What are the signs or symptoms? The main symptom of this condition is pain on the thumb side of the wrist. The pain may get worse when you grasp something or turn your wrist. Other symptoms may include:  Pain that extends up the forearm.  Swelling of your wrist and hand.  Trouble moving the thumb and wrist.  A sensation of snapping in the wrist.  A bump filled with fluid (cyst) in the area of the pain. How is this diagnosed? This condition may be diagnosed based on:  Your symptoms and medical history.  A physical exam. During the exam, your health care provider may do a simple test Wynn Maudlin test) that involves pulling your thumb and wrist to see if this causes pain. You may also need  to have an X-ray or ultrasound. How is this treated? Treatment for this condition may include:  Avoiding any activity that causes pain and swelling.  Taking medicines. Anti-inflammatory medicines and corticosteroid injections may be used to reduce inflammation and relieve pain.  Wearing a splint.  Having surgery. This may be needed if other treatments do not work. Once the pain and swelling have gone down, you may start:  Physical therapy. This includes exercises to improve movement and strength in your wrist and thumb.  Occupational therapy. This includes adjusting how you move your wrist. Follow these instructions at home: If you have a splint:  Wear the splint as told by your health care provider. Remove it only as told by your health care provider.  Loosen the splint if your fingers tingle, become numb, or turn cold and blue.  Keep the splint clean.  If the splint is not waterproof: ? Do not let it get wet. ? Cover it with a watertight covering when you take a bath or a shower. Managing pain, stiffness, and swelling  Avoid movements and activities that cause pain and swelling in the wrist area.  If directed, put ice on the painful area. This may be helpful after doing activities that involve the sore wrist. To do this: ? Put ice in a plastic bag. ? Place a towel between your skin and the bag. ? Leave the ice on for 20 minutes, 2-3 times a day. ? Remove the ice if  your skin turns bright red. This is very important. If you cannot feel pain, heat, or cold, you have a greater risk of damage to the area.  Move your fingers often to reduce stiffness and swelling.  Raise (elevate) the injured area above the level of your heart while you are sitting or lying down.   General instructions  Return to your normal activities as told by your health care provider. Ask your health care provider what activities are safe for you.  Take over-the-counter and prescription medicines only  as told by your health care provider.  Keep all follow-up visits. This is important. Contact a health care provider if:  Your pain medicine does not help.  Your pain gets worse.  You develop new symptoms. Summary  De Quervain's tenosynovitis is a condition that causes inflammation of the tendon on the thumb side of the wrist.  The condition occurs most often in women who are 28-67 years old.  The exact cause of this condition is not known. It may be associated with overuse of the hand and wrist.  Treatment starts with avoiding activity that causes pain or swelling in the wrist area. Other treatments may include wearing a splint and taking medicine. Sometimes, surgery is needed. This information is not intended to replace advice given to you by your health care provider. Make sure you discuss any questions you have with your health care provider. Document Revised: 09/07/2019 Document Reviewed: 09/07/2019 Elsevier Patient Education  2021 [employer].

## 2020-11-01 ENCOUNTER — Inpatient Hospital Stay
Admission: RE | Admit: 2020-11-01 | Discharge: 2020-11-02 | DRG: 470 | Disposition: A | Discharge status: Home Health | Payer: PPO | Attending: Orthopedic Surgery | Admitting: Orthopedic Surgery

## 2020-11-01 ENCOUNTER — Encounter
Admission: RE | Disposition: A | Discharge status: Home / Self Care | Payer: Self-pay | Source: Home / Self Care | Attending: Orthopedic Surgery

## 2020-11-01 ENCOUNTER — Inpatient Hospital Stay: Payer: PPO | Admitting: Anesthesiology

## 2020-11-01 ENCOUNTER — Observation Stay: Payer: PPO

## 2020-11-01 ENCOUNTER — Encounter: Payer: Self-pay | Admitting: Orthopedic Surgery

## 2020-11-01 ENCOUNTER — Other Ambulatory Visit: Payer: Self-pay

## 2020-11-01 ENCOUNTER — Inpatient Hospital Stay: Payer: PPO

## 2020-11-01 DIAGNOSIS — G40909 Epilepsy, unspecified, not intractable, without status epilepticus: Secondary | ICD-10-CM | POA: Diagnosis present

## 2020-11-01 DIAGNOSIS — Z79899 Other long term (current) drug therapy: Secondary | ICD-10-CM | POA: Diagnosis not present

## 2020-11-01 DIAGNOSIS — Z96649 Presence of unspecified artificial hip joint: Secondary | ICD-10-CM

## 2020-11-01 DIAGNOSIS — Z96641 Presence of right artificial hip joint: Secondary | ICD-10-CM | POA: Diagnosis not present

## 2020-11-01 DIAGNOSIS — E039 Hypothyroidism, unspecified: Secondary | ICD-10-CM | POA: Diagnosis present

## 2020-11-01 DIAGNOSIS — Z881 Allergy status to other antibiotic agents status: Secondary | ICD-10-CM

## 2020-11-01 DIAGNOSIS — Z6829 Body mass index (BMI) 29.0-29.9, adult: Secondary | ICD-10-CM | POA: Diagnosis not present

## 2020-11-01 DIAGNOSIS — E7800 Pure hypercholesterolemia, unspecified: Secondary | ICD-10-CM | POA: Diagnosis present

## 2020-11-01 DIAGNOSIS — Z91040 Latex allergy status: Secondary | ICD-10-CM

## 2020-11-01 DIAGNOSIS — Z20822 Contact with and (suspected) exposure to covid-19: Secondary | ICD-10-CM | POA: Diagnosis present

## 2020-11-01 DIAGNOSIS — Z87892 Personal history of anaphylaxis: Secondary | ICD-10-CM | POA: Diagnosis not present

## 2020-11-01 DIAGNOSIS — F32A Depression, unspecified: Secondary | ICD-10-CM | POA: Diagnosis present

## 2020-11-01 DIAGNOSIS — Z947 Corneal transplant status: Secondary | ICD-10-CM | POA: Diagnosis not present

## 2020-11-01 DIAGNOSIS — Z87891 Personal history of nicotine dependence: Secondary | ICD-10-CM | POA: Diagnosis not present

## 2020-11-01 DIAGNOSIS — M1611 Unilateral primary osteoarthritis, right hip: Principal | ICD-10-CM | POA: Diagnosis present

## 2020-11-01 DIAGNOSIS — J302 Other seasonal allergic rhinitis: Secondary | ICD-10-CM | POA: Diagnosis present

## 2020-11-01 DIAGNOSIS — E049 Nontoxic goiter, unspecified: Secondary | ICD-10-CM | POA: Diagnosis present

## 2020-11-01 DIAGNOSIS — F419 Anxiety disorder, unspecified: Secondary | ICD-10-CM | POA: Diagnosis present

## 2020-11-01 DIAGNOSIS — G8918 Other acute postprocedural pain: Secondary | ICD-10-CM

## 2020-11-01 DIAGNOSIS — E785 Hyperlipidemia, unspecified: Secondary | ICD-10-CM | POA: Diagnosis present

## 2020-11-01 DIAGNOSIS — Z471 Aftercare following joint replacement surgery: Secondary | ICD-10-CM | POA: Diagnosis not present

## 2020-11-01 DIAGNOSIS — E669 Obesity, unspecified: Secondary | ICD-10-CM | POA: Diagnosis present

## 2020-11-01 DIAGNOSIS — Z90710 Acquired absence of both cervix and uterus: Secondary | ICD-10-CM

## 2020-11-01 DIAGNOSIS — Z419 Encounter for procedure for purposes other than remedying health state, unspecified: Secondary | ICD-10-CM

## 2020-11-01 DIAGNOSIS — Z79890 Hormone replacement therapy (postmenopausal): Secondary | ICD-10-CM | POA: Diagnosis not present

## 2020-11-01 HISTORY — PX: TOTAL HIP ARTHROPLASTY: SHX124

## 2020-11-01 LAB — CBC
HCT: 37.9 % (ref 36.0–46.0)
Hemoglobin: 12.9 g/dL (ref 12.0–15.0)
MCH: 32.2 pg (ref 26.0–34.0)
MCHC: 34 g/dL (ref 30.0–36.0)
MCV: 94.5 fL (ref 80.0–100.0)
Platelets: 146 10*3/uL — ABNORMAL LOW (ref 150–400)
RBC: 4.01 MIL/uL (ref 3.87–5.11)
RDW: 12.1 % (ref 11.5–15.5)
WBC: 11 10*3/uL — ABNORMAL HIGH (ref 4.0–10.5)
nRBC: 0 % (ref 0.0–0.2)

## 2020-11-01 LAB — CREATININE, SERUM
Creatinine, Ser: 0.72 mg/dL (ref 0.44–1.00)
GFR, Estimated: >60 mL/min (ref >60)

## 2020-11-01 LAB — ABO/RH: ABO/RH(D): A POS

## 2020-11-01 SURGERY — ARTHROPLASTY, HIP, TOTAL, ANTERIOR APPROACH
Anesthesia: General | Site: Hip | Laterality: Right

## 2020-11-01 MED ORDER — BISACODYL 10 MG RE SUPP: 10.0000 mg | Freq: Every day | RECTAL | Status: DC | PRN

## 2020-11-01 MED ORDER — METHOCARBAMOL 500 MG PO TABS
500.0000 mg | ORAL_TABLET | Freq: Four times a day (QID) | ORAL | Status: DC | PRN
  Administered 2020-11-01 – 2020-11-02 (×3): 500 mg via ORAL
  Filled 2020-11-01 (×3): qty 1

## 2020-11-01 MED ORDER — LIDOCAINE HCL (PF) 2 % IJ SOLN
INTRAMUSCULAR | Status: AC
  Filled 2020-11-01: qty 2

## 2020-11-01 MED ORDER — DOCUSATE SODIUM 100 MG PO CAPS
100.0000 mg | ORAL_CAPSULE | Freq: Two times a day (BID) | ORAL | Status: DC
  Administered 2020-11-01 – 2020-11-02 (×3): 100 mg via ORAL
  Filled 2020-11-01 (×3): qty 1

## 2020-11-01 MED ORDER — ONDANSETRON HCL 4 MG PO TABS: 4.0000 mg | ORAL_TABLET | Freq: Four times a day (QID) | ORAL | Status: DC | PRN

## 2020-11-01 MED ORDER — GLYCOPYRROLATE 0.2 MG/ML IJ SOLN
INTRAMUSCULAR | Status: DC | PRN
  Administered 2020-11-01: .2 mg via INTRAVENOUS

## 2020-11-01 MED ORDER — TRAZODONE HCL 50 MG PO TABS
50.0000 mg | ORAL_TABLET | Freq: Every evening | ORAL | Status: DC | PRN
  Administered 2020-11-01: 50 mg via ORAL
  Filled 2020-11-01: qty 1

## 2020-11-01 MED ORDER — DROPERIDOL 2.5 MG/ML IJ SOLN
0.6250 mg | Freq: Once | INTRAMUSCULAR | Status: DC | PRN
Start: 2020-11-01 — End: 2020-11-01
  Filled 2020-11-01: qty 2

## 2020-11-01 MED ORDER — ALUM & MAG HYDROXIDE-SIMETH 200-200-20 MG/5ML PO SUSP: 30.0000 mL | ORAL | Status: DC | PRN

## 2020-11-01 MED ORDER — METOCLOPRAMIDE HCL 10 MG PO TABS: 5.0000 mg | ORAL_TABLET | Freq: Three times a day (TID) | ORAL | Status: DC | PRN

## 2020-11-01 MED ORDER — MORPHINE SULFATE (PF) 2 MG/ML IV SOLN: 2.0000 mg | INTRAVENOUS | Status: DC | PRN

## 2020-11-01 MED ORDER — SODIUM CHLORIDE 0.9 % IV SOLN: INTRAVENOUS | Status: DC

## 2020-11-01 MED ORDER — DIPHENHYDRAMINE HCL 12.5 MG/5ML PO ELIX: 12.5000 mg | ORAL_SOLUTION | ORAL | Status: DC | PRN

## 2020-11-01 MED ORDER — PREDNISOLONE ACETATE 1 % OP SUSP
1.0000 [drp] | Freq: Every day | OPHTHALMIC | Status: DC
  Administered 2020-11-01 – 2020-11-02 (×2): 1 [drp] via OPHTHALMIC
  Filled 2020-11-01: qty 1

## 2020-11-01 MED ORDER — DEXAMETHASONE SODIUM PHOSPHATE 10 MG/ML IJ SOLN
INTRAMUSCULAR | Status: AC
  Filled 2020-11-01: qty 1

## 2020-11-01 MED ORDER — PROPOFOL 10 MG/ML IV BOLUS
INTRAVENOUS | Status: AC
  Filled 2020-11-01: qty 20

## 2020-11-01 MED ORDER — CHLORHEXIDINE GLUCONATE 0.12 % MT SOLN
OROMUCOSAL | Status: AC
  Administered 2020-11-01: 15 mL via OROMUCOSAL
  Filled 2020-11-01: qty 15

## 2020-11-01 MED ORDER — HYDROCODONE-ACETAMINOPHEN 7.5-325 MG PO TABS
1.0000 | ORAL_TABLET | Freq: Once | ORAL | Status: DC | PRN
Start: 2020-11-01 — End: 2020-11-01

## 2020-11-01 MED ORDER — KETOROLAC TROMETHAMINE 15 MG/ML IJ SOLN
15.0000 mg | Freq: Once | INTRAMUSCULAR | Status: AC
  Administered 2020-11-01: 15 mg via INTRAVENOUS
  Filled 2020-11-01: qty 1

## 2020-11-01 MED ORDER — GLYCOPYRROLATE 0.2 MG/ML IJ SOLN
INTRAMUSCULAR | Status: AC
  Filled 2020-11-01: qty 1

## 2020-11-01 MED ORDER — FENTANYL CITRATE (PF) 100 MCG/2ML IJ SOLN: 25.0000 ug | INTRAMUSCULAR | Status: DC | PRN

## 2020-11-01 MED ORDER — LEVOTHYROXINE SODIUM 50 MCG PO TABS
75.0000 ug | ORAL_TABLET | Freq: Every day | ORAL | Status: DC
  Administered 2020-11-02: 75 ug via ORAL
  Filled 2020-11-01: qty 1

## 2020-11-01 MED ORDER — HYDROCODONE-ACETAMINOPHEN 5-325 MG PO TABS: 1.0000 | ORAL_TABLET | ORAL | Status: DC | PRN

## 2020-11-01 MED ORDER — ONDANSETRON HCL 4 MG/2ML IJ SOLN: 4.0000 mg | Freq: Once | INTRAMUSCULAR | Status: DC | PRN

## 2020-11-01 MED ORDER — ACETAMINOPHEN 325 MG PO TABS: 325.0000 mg | ORAL_TABLET | ORAL | Status: DC | PRN

## 2020-11-01 MED ORDER — ACETAMINOPHEN 160 MG/5ML PO SOLN
325.0000 mg | ORAL | Status: DC | PRN
  Filled 2020-11-01: qty 20.3

## 2020-11-01 MED ORDER — DEXMEDETOMIDINE (PRECEDEX) IN NS 20 MCG/5ML (4 MCG/ML) IV SYRINGE
PREFILLED_SYRINGE | INTRAVENOUS | Status: AC
  Filled 2020-11-01: qty 5

## 2020-11-01 MED ORDER — BUPIVACAINE-EPINEPHRINE 0.25% -1:200000 IJ SOLN
INTRAMUSCULAR | Status: DC | PRN
  Administered 2020-11-01: 30 mL

## 2020-11-01 MED ORDER — SODIUM CHLORIDE 0.9 % IV SOLN
INTRAVENOUS | Status: DC | PRN
  Administered 2020-11-01: 60 mL

## 2020-11-01 MED ORDER — NEOMYCIN-POLYMYXIN B GU 40-200000 IR SOLN
Status: DC | PRN
  Administered 2020-11-01: 4 mL

## 2020-11-01 MED ORDER — TRAMADOL HCL 50 MG PO TABS
50.0000 mg | ORAL_TABLET | Freq: Four times a day (QID) | ORAL | Status: DC
  Administered 2020-11-01 – 2020-11-02 (×5): 50 mg via ORAL
  Filled 2020-11-01 (×5): qty 1

## 2020-11-01 MED ORDER — METOCLOPRAMIDE HCL 5 MG/ML IJ SOLN: 5.0000 mg | Freq: Three times a day (TID) | INTRAMUSCULAR | Status: DC | PRN

## 2020-11-01 MED ORDER — DEXAMETHASONE SODIUM PHOSPHATE 10 MG/ML IJ SOLN
INTRAMUSCULAR | Status: DC | PRN
  Administered 2020-11-01: 10 mg via INTRAVENOUS

## 2020-11-01 MED ORDER — MORPHINE SULFATE (PF) 2 MG/ML IV SOLN
0.5000 mg | INTRAVENOUS | Status: DC | PRN
  Administered 2020-11-01: 1 mg via INTRAVENOUS
  Filled 2020-11-01: qty 1

## 2020-11-01 MED ORDER — POLYETHYLENE GLYCOL 3350 17 G PO PACK: 17.0000 g | PACK | Freq: Every day | ORAL | Status: DC | PRN

## 2020-11-01 MED ORDER — BUPIVACAINE HCL (PF) 0.5 % IJ SOLN
INTRAMUSCULAR | Status: DC | PRN
  Administered 2020-11-01: 3 mL

## 2020-11-01 MED ORDER — ROSUVASTATIN CALCIUM 10 MG PO TABS
10.0000 mg | ORAL_TABLET | Freq: Every day | ORAL | Status: DC
  Administered 2020-11-01 – 2020-11-02 (×2): 10 mg via ORAL
  Filled 2020-11-01 (×2): qty 1

## 2020-11-01 MED ORDER — CEFAZOLIN SODIUM-DEXTROSE 2-4 GM/100ML-% IV SOLN
2.0000 g | Freq: Four times a day (QID) | INTRAVENOUS | Status: AC
Start: 2020-11-01 — End: 2020-11-02
  Administered 2020-11-01: 2 g via INTRAVENOUS
  Filled 2020-11-01 (×2): qty 100

## 2020-11-01 MED ORDER — MENTHOL 3 MG MT LOZG
1.0000 | LOZENGE | OROMUCOSAL | Status: DC | PRN
  Filled 2020-11-01: qty 9

## 2020-11-01 MED ORDER — HYDROCODONE-ACETAMINOPHEN 7.5-325 MG PO TABS
1.0000 | ORAL_TABLET | ORAL | Status: DC | PRN
  Administered 2020-11-01 (×2): 1 via ORAL
  Filled 2020-11-01 (×2): qty 1

## 2020-11-01 MED ORDER — ONDANSETRON HCL 4 MG/2ML IJ SOLN
INTRAMUSCULAR | Status: DC | PRN
  Administered 2020-11-01: 4 mg via INTRAVENOUS

## 2020-11-01 MED ORDER — ACETAMINOPHEN 325 MG PO TABS: 325.0000 mg | ORAL_TABLET | Freq: Four times a day (QID) | ORAL | Status: DC | PRN

## 2020-11-01 MED ORDER — LIDOCAINE HCL (CARDIAC) PF 100 MG/5ML IV SOSY
PREFILLED_SYRINGE | INTRAVENOUS | Status: DC | PRN
  Administered 2020-11-01: 40 mg via INTRAVENOUS

## 2020-11-01 MED ORDER — METHOCARBAMOL 1000 MG/10ML IJ SOLN
500.0000 mg | Freq: Four times a day (QID) | INTRAVENOUS | Status: DC | PRN
  Filled 2020-11-01: qty 5

## 2020-11-01 MED ORDER — ENOXAPARIN SODIUM 40 MG/0.4ML IJ SOSY
40.0000 mg | PREFILLED_SYRINGE | INTRAMUSCULAR | Status: DC
  Administered 2020-11-02: 40 mg via SUBCUTANEOUS
  Filled 2020-11-01: qty 0.4

## 2020-11-01 MED ORDER — ONDANSETRON HCL 4 MG/2ML IJ SOLN: 4.0000 mg | Freq: Four times a day (QID) | INTRAMUSCULAR | Status: DC | PRN

## 2020-11-01 MED ORDER — CEFAZOLIN SODIUM-DEXTROSE 2-4 GM/100ML-% IV SOLN
INTRAVENOUS | Status: AC
  Administered 2020-11-01: 2 g via INTRAVENOUS
  Filled 2020-11-01: qty 100

## 2020-11-01 MED ORDER — FLUTICASONE PROPIONATE 50 MCG/ACT NA SUSP
1.0000 | Freq: Every day | NASAL | Status: DC | PRN
  Filled 2020-11-01: qty 16

## 2020-11-01 MED ORDER — MIDAZOLAM HCL 5 MG/5ML IJ SOLN
INTRAMUSCULAR | Status: DC | PRN
  Administered 2020-11-01 (×2): 1 mg via INTRAVENOUS

## 2020-11-01 MED ORDER — PROPOFOL 500 MG/50ML IV EMUL
INTRAVENOUS | Status: DC | PRN
  Administered 2020-11-01: 125 ug/kg/min via INTRAVENOUS

## 2020-11-01 MED ORDER — MIDAZOLAM HCL 2 MG/2ML IJ SOLN
INTRAMUSCULAR | Status: AC
  Filled 2020-11-01: qty 2

## 2020-11-01 MED ORDER — PHENOL 1.4 % MT LIQD
1.0000 | OROMUCOSAL | Status: DC | PRN
Start: 2020-11-01 — End: 2020-11-02
  Filled 2020-11-01: qty 177

## 2020-11-01 MED ORDER — PANTOPRAZOLE SODIUM 40 MG PO TBEC
40.0000 mg | DELAYED_RELEASE_TABLET | Freq: Every day | ORAL | Status: DC
  Administered 2020-11-01 – 2020-11-02 (×2): 40 mg via ORAL
  Filled 2020-11-01 (×2): qty 1

## 2020-11-01 MED ORDER — MAGNESIUM CITRATE PO SOLN
1.0000 | Freq: Once | ORAL | Status: DC | PRN
  Filled 2020-11-01: qty 296

## 2020-11-01 MED ORDER — ONDANSETRON HCL 4 MG/2ML IJ SOLN
INTRAMUSCULAR | Status: AC
  Filled 2020-11-01: qty 2

## 2020-11-01 MED ORDER — OXYCODONE HCL 5 MG PO TABS
10.0000 mg | ORAL_TABLET | ORAL | Status: DC | PRN
  Administered 2020-11-02 (×2): 10 mg via ORAL
  Filled 2020-11-01 (×2): qty 2

## 2020-11-01 MED ORDER — LORATADINE 10 MG PO TABS: 10.0000 mg | ORAL_TABLET | Freq: Every day | ORAL | Status: DC | PRN

## 2020-11-01 MED ORDER — SODIUM CHLORIDE 0.9 % IV SOLN
INTRAVENOUS | Status: DC | PRN
  Administered 2020-11-01: 25 ug/min via INTRAVENOUS

## 2020-11-01 SURGICAL SUPPLY — 61 items
BLADE SAGITTAL AGGR TOOTH XLG (BLADE) ×2 IMPLANT
BNDG COHESIVE 6X5 TAN STRL LF (GAUZE/BANDAGES/DRESSINGS) ×6 IMPLANT
CANISTER SUCT 1200ML W/VALVE (MISCELLANEOUS) ×2 IMPLANT
CANISTER WOUND CARE 500ML ATS (WOUND CARE) ×2 IMPLANT
CHLORAPREP W/TINT 26 (MISCELLANEOUS) ×2 IMPLANT
COVER BACK TABLE REUSABLE LG (DRAPES) ×2 IMPLANT
COVER WAND RF STERILE (DRAPES) ×2 IMPLANT
DRAPE 3/4 80X56 (DRAPES) ×6 IMPLANT
DRAPE C-ARM XRAY 36X54 (DRAPES) ×2 IMPLANT
DRAPE INCISE IOBAN 66X60 STRL (DRAPES) IMPLANT
DRAPE POUCH INSTRU U-SHP 10X18 (DRAPES) ×2 IMPLANT
DRESSING SURGICEL FIBRLLR 1X2 (HEMOSTASIS) ×2 IMPLANT
DRSG MEPILEX SACRM 8.7X9.8 (GAUZE/BANDAGES/DRESSINGS) ×2 IMPLANT
DRSG OPSITE POSTOP 4X8 (GAUZE/BANDAGES/DRESSINGS) ×4 IMPLANT
DRSG SURGICEL FIBRILLAR 1X2 (HEMOSTASIS) ×4
ELECT BLADE 6.5 EXT (BLADE) ×2 IMPLANT
ELECT REM PT RETURN 9FT ADLT (ELECTROSURGICAL) ×2
ELECTRODE REM PT RTRN 9FT ADLT (ELECTROSURGICAL) ×1 IMPLANT
GLOVE SURG SYN 9.0  PF PI (GLOVE) ×2
GLOVE SURG SYN 9.0 PF PI (GLOVE) ×2 IMPLANT
GLOVE SURG UNDER POLY LF SZ9 (GLOVE) ×2 IMPLANT
GOWN SRG 2XL LVL 4 RGLN SLV (GOWNS) ×1 IMPLANT
GOWN STRL NON-REIN 2XL LVL4 (GOWNS) ×1
GOWN STRL REUS W/ TWL LRG LVL3 (GOWN DISPOSABLE) ×1 IMPLANT
GOWN STRL REUS W/TWL LRG LVL3 (GOWN DISPOSABLE) ×1
HEMOVAC 400CC 10FR (MISCELLANEOUS) IMPLANT
HIP FEM HD S 28 (Head) ×2 IMPLANT
HOLDER FOLEY CATH W/STRAP (MISCELLANEOUS) ×2 IMPLANT
HOOD PEEL AWAY FLYTE STAYCOOL (MISCELLANEOUS) ×2 IMPLANT
IRRIGATION SURGIPHOR STRL (IV SOLUTION) ×2 IMPLANT
KIT PREVENA INCISION MGT 13 (CANNISTER) ×2 IMPLANT
LINER DUAL MOB 50MM (Liner) ×2 IMPLANT
MANIFOLD NEPTUNE II (INSTRUMENTS) ×2 IMPLANT
MAT ABSORB  FLUID 56X50 GRAY (MISCELLANEOUS) ×1
MAT ABSORB FLUID 56X50 GRAY (MISCELLANEOUS) ×1 IMPLANT
NDL SAFETY ECLIPSE 18X1.5 (NEEDLE) ×1 IMPLANT
NEEDLE HYPO 18GX1.5 SHARP (NEEDLE) ×1
NEEDLE SPNL 20GX3.5 QUINCKE YW (NEEDLE) ×4 IMPLANT
NS IRRIG 1000ML POUR BTL (IV SOLUTION) ×2 IMPLANT
PACK HIP COMPR (MISCELLANEOUS) ×2 IMPLANT
SCALPEL PROTECTED #10 DISP (BLADE) ×4 IMPLANT
SHELL ACETABULAR SZ0 50 DME (Shell) ×2 IMPLANT
SOL PREP PVP 2OZ (MISCELLANEOUS) ×2
SOLUTION PREP PVP 2OZ (MISCELLANEOUS) ×1 IMPLANT
SPONGE DRAIN TRACH 4X4 STRL 2S (GAUZE/BANDAGES/DRESSINGS) ×2 IMPLANT
STAPLER SKIN PROX 35W (STAPLE) ×2 IMPLANT
STEM FEMORAL SZ2 STD COLLARED (Stem) ×2 IMPLANT
STRAP SAFETY 5IN WIDE (MISCELLANEOUS) ×2 IMPLANT
SUT DVC 2 QUILL PDO  T11 36X36 (SUTURE) ×1
SUT DVC 2 QUILL PDO T11 36X36 (SUTURE) ×1 IMPLANT
SUT SILK 0 (SUTURE) ×1
SUT SILK 0 30XBRD TIE 6 (SUTURE) ×1 IMPLANT
SUT V-LOC 90 ABS DVC 3-0 CL (SUTURE) ×2 IMPLANT
SUT VIC AB 1 CT1 36 (SUTURE) ×2 IMPLANT
SYR 20ML LL LF (SYRINGE) ×2 IMPLANT
SYR 30ML LL (SYRINGE) ×2 IMPLANT
SYR 50ML LL SCALE MARK (SYRINGE) ×4 IMPLANT
SYR BULB IRRIG 60ML STRL (SYRINGE) ×2 IMPLANT
TAPE MICROFOAM 4IN (TAPE) ×2 IMPLANT
TOWEL OR 17X26 4PK STRL BLUE (TOWEL DISPOSABLE) ×2 IMPLANT
TRAY FOLEY MTR SLVR 16FR STAT (SET/KITS/TRAYS/PACK) ×2 IMPLANT

## 2020-11-01 NOTE — Anesthesia Postprocedure Evaluation (Signed)
Anesthesia Post Note  Patient: Lorraine Mills  Procedure(s) Performed: TOTAL HIP ARTHROPLASTY ANTERIOR APPROACH (Right Hip)  Patient location during evaluation: PACU Anesthesia Type: Spinal Level of consciousness: oriented and awake and alert Pain management: pain level controlled Vital Signs Assessment: post-procedure vital signs reviewed and stable Respiratory status: spontaneous breathing, respiratory function stable and nonlabored ventilation Cardiovascular status: blood pressure returned to baseline and stable Postop Assessment: no headache, no backache, no apparent nausea or vomiting and spinal receding Anesthetic complications: no   No complications documented.   Last Vitals:  Vitals:   11/01/20 1215 11/01/20 1228  BP: 140/70 (!) 127/58  Pulse: 65 (!) 57  Resp: 14 16  Temp: (!) 36.1 C (!) 36.3 C  SpO2: 100% 100%    Last Pain:  Vitals:   11/01/20 1200  TempSrc:   PainSc: 0-No pain                 Alphonsus Sias

## 2020-11-01 NOTE — Progress Notes (Signed)
Pt admitted to room 137 and oriented to unit equipment. All questions and concerns answered at this time.   11/01/20 1228  Vitals  Temp (!) 97.4 F (36.3 C)  BP (!) 127/58  MAP (mmHg) 80  BP Location Left Arm  BP Method Automatic  Patient Position (if appropriate) Lying  Pulse Rate (!) 57  Pulse Rate Source Monitor  Resp 16  MEWS COLOR  MEWS Score Color Green  Oxygen Therapy  SpO2 100 %  O2 Device Room Air

## 2020-11-01 NOTE — Op Note (Signed)
11/01/2020  10:58 AM  PATIENT:  Lorraine Mills  70 y.o. female  PRE-OPERATIVE DIAGNOSIS:  Primary osteoarthritis of right hip M16.11  POST-OPERATIVE DIAGNOSIS:  Primary osteoarthritis of right hip M16.11  PROCEDURE:  Procedure(s): TOTAL HIP ARTHROPLASTY ANTERIOR APPROACH (Right)  SURGEON: Laurene Footman, MD  ASSISTANTS: None  ANESTHESIA:   spinal  EBL:  Total I/O In: -  Out: 550 [Urine:150; Blood:400]  BLOOD ADMINISTERED:none  DRAINS: Incisional wound VAC   LOCAL MEDICATIONS USED:  MARCAINE    and OTHER Exparel  SPECIMEN:  Source of Specimen:  Right femoral head  DISPOSITION OF SPECIMEN:  PATHOLOGY  COUNTS:  YES  TOURNIQUET:  * No tourniquets in log *  IMPLANTS: Medacta AMIS size 2, standard stem, 50 mm Mpact DM cup and liner with metal S 28 mm head  DICTATION: .Dragon Dictation   The patient was brought to the operating room and after spinal anesthesia was obtained patient was placed on the operative table with the ipsilateral foot into the Medacta attachment, contralateral leg on a well-padded table. C-arm was brought in and preop template x-ray taken. After prepping and draping in usual sterile fashion appropriate patient identification and timeout procedures were completed. Anterior approach to the hip was obtained and centered over the greater trochanter and TFL muscle. The subcutaneous tissue was incised hemostasis being achieved by electrocautery. TFL fascia was incised and the muscle retracted laterally deep retractor placed. The lateral femoral circumflex vessels were identified and ligated. The anterior capsule was exposed and a capsulotomy performed. The neck was identified and a femoral neck cut carried out with a saw. The head was removed without difficulty and showed sclerotic femoral head and acetabulum. Reaming was carried out to 50 mm and a 50 mm cup trial gave appropriate tightness to the acetabular component a 50 mm DM cup was impacted into position. The leg was  then externally rotated and ischiofemoral and pubofemoral releases carried out. The femur was sequentially broached to a size 2, size 2 lateralized and 2 standard with S head trials were placed and the final components chosen. The 2 standard stem was inserted along with a metal S 28 mm head and 50 mm liner. The hip was reduced and was stable the wound was thoroughly irrigated with fibrillar placed along the posterior capsule and medial neck. The deep fascia ws closed using a heavy Quill after infiltration of 30 cc of quarter percent Sensorcaine with epinephrine diluted with Exparel .3-0 V-loc to close the skin with skin staples incisional wound VAC applied and  patient was sent to recovery in stable condition.   PLAN OF CARE: Admit for overnight observation

## 2020-11-01 NOTE — Anesthesia Procedure Notes (Signed)
Spinal  Patient location during procedure: OR Start time: 11/01/2020 9:12 AM End time: 11/01/2020 9:18 AM Reason for block: surgical anesthesia Staffing Performed: resident/CRNA  Resident/CRNA: Aline Brochure, CRNA Preanesthetic Checklist Completed: patient identified, IV checked, site marked, risks and benefits discussed, surgical consent, monitors and equipment checked, pre-op evaluation and timeout performed Spinal Block Patient position: sitting Prep: Betadine Patient monitoring: heart rate, continuous pulse ox, blood pressure and cardiac monitor Approach: midline Location: L4-5 Injection technique: single-shot Needle Needle type: Whitacre and Introducer  Needle gauge: 24 G Needle length: 9 cm Assessment Events: CSF return Additional Notes Negative paresthesia. Negative blood return. Positive free-flowing CSF. Expiration date of kit checked and confirmed. Patient tolerated procedure well, without complications.

## 2020-11-01 NOTE — Anesthesia Preprocedure Evaluation (Addendum)
Anesthesia Evaluation  Patient identified by MRN, date of birth, ID band Patient awake    Reviewed: Allergy & Precautions, H&P , NPO status , reviewed documented beta blocker date and time   Airway Mallampati: II  TM Distance: >3 FB Neck ROM: full    Dental  (+) Caps, Teeth Intact   Pulmonary former smoker,    Pulmonary exam normal        Cardiovascular Normal cardiovascular exam     Neuro/Psych PSYCHIATRIC DISORDERS Anxiety Depression    GI/Hepatic neg GERD  ,  Endo/Other  Hypothyroidism   Renal/GU      Musculoskeletal  (+) Arthritis ,   Abdominal   Peds  Hematology   Anesthesia Other Findings Past Medical History: No date: Anxiety No date: Arthritis 01/31/2016: Depression No date: Goiter No date: Hyperlipidemia     Comment:  TGs No date: Hypothyroidism No date: Seasonal allergies  Past Surgical History: No date: ABDOMINAL HYSTERECTOMY     Comment:  supracervical  2001: BILATERAL OOPHORECTOMY     Comment:  BENIGN TUMOR 1985: BREAST BIOPSY; Left     Comment:  cyst  No date: CATARACT EXTRACTION; Right 08/17/2009: COLONOSCOPY 03/19/2016: COLONOSCOPY WITH PROPOFOL; N/A     Comment:  Procedure: COLONOSCOPY WITH PROPOFOL;  Surgeon: Robert Bellow, MD;  Location: [facility] ENDOSCOPY;  Service:               Endoscopy;  Laterality: N/A; 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; Right     Reproductive/Obstetrics                            Anesthesia Physical Anesthesia Plan  ASA: II  Anesthesia Plan: General and Spinal   Post-op Pain Management:    Induction: Intravenous  PONV Risk Score and Plan: Treatment may vary due to age or medical condition and TIVA  Airway Management Planned: Nasal Cannula and Natural Airway  Additional Equipment:   Intra-op Plan:   Post-operative Plan:   Informed Consent: I have reviewed the patients History and Physical, chart, labs and  discussed the procedure including the risks, benefits and alternatives for the proposed anesthesia with the patient or authorized representative who has indicated his/her understanding and acceptance.     Dental Advisory Given  Plan Discussed with: CRNA  Anesthesia Plan Comments: (Spinal vs GA discussed, pt accepts spinal/TIVA)      Anesthesia Quick Evaluation

## 2020-11-01 NOTE — Progress Notes (Signed)
OT Cancellation Note  Patient Details Name: Lorraine Mills MRN: [id number] DOB: 11-22-1950   Cancelled Treatment:    Reason Eval/Treat Not Completed: Pain limiting ability to participate. Upon arrival pt reports 10/10 pain at rest. RN notified and in room to address. Will follow up next date as able to initiate services.   Dessie Coma, M.S. OTR/L  11/01/20, 3:08 PM  ascom [phone]

## 2020-11-01 NOTE — Transfer of Care (Signed)
Immediate Anesthesia Transfer of Care Note  Patient: Lorraine Mills  Procedure(s) Performed: TOTAL HIP ARTHROPLASTY ANTERIOR APPROACH (Right Hip)  Patient Location: PACU  Anesthesia Type:Spinal  Level of Consciousness: awake, alert  and oriented  Airway & Oxygen Therapy: Patient Spontanous Breathing  Post-op Assessment: Report given to RN and Post -op Vital signs reviewed and stable  Post vital signs: Reviewed and stable  Last Vitals:  Vitals Value Taken Time  BP 92/48   Temp    Pulse 100   Resp 11 11/01/20 1059  SpO2 96   Vitals shown include unvalidated device data.  Last Pain:  Vitals:   11/01/20 0820  TempSrc: Temporal  PainSc: 4          Complications: No complications documented.

## 2020-11-01 NOTE — Progress Notes (Signed)
PT Cancellation Note  Patient Details Name: Lorraine Mills MRN: [id number] DOB: 28-Jun-1950   Cancelled Treatment:    Reason Eval/Treat Not Completed: Pain limiting ability to participate (Consult received and chart reviewed.  Per OT, pain currently uncontrolled and patient unable to tolerate session.  RN informed/aware and administering meds at this time.  Will re-attempt at later time/date as medically appropriate and available.)   Myrissa Chipley H. Owens Shark, PT, DPT, NCS 11/01/20, 3:26 PM [phone]

## 2020-11-01 NOTE — Plan of Care (Signed)
  Problem: Education: Goal: Knowledge of the prescribed therapeutic regimen will improve Outcome: Progressing Goal: Understanding of discharge needs will improve Outcome: Progressing Goal: Individualized Educational Video(s) Outcome: Progressing   Problem: Activity: Goal: Ability to avoid complications of mobility impairment will improve Outcome: Progressing Goal: Ability to tolerate increased activity will improve Outcome: Progressing   Problem: Clinical Measurements: Goal: Postoperative complications will be avoided or minimized Outcome: Progressing   Problem: Pain Management: Goal: Pain level will decrease with appropriate interventions Outcome: Progressing   Problem: Skin Integrity: Goal: Will show signs of wound healing Outcome: Progressing

## 2020-11-01 NOTE — Progress Notes (Signed)
PT Cancellation Note  Patient Details Name: Lorraine Mills MRN: [id number] DOB: 01-Aug-1950   Cancelled Treatment:    Reason Eval/Treat Not Completed: Pain limiting ability to participate.  Pt requested no PT services this date secondary to uncontrolled 10/10 RLE pain. Nursing aware.  Will attempt to see pt at a future date/time as medically appropriate.     Linus Salmons PT, DPT 11/01/20, 4:33 PM

## 2020-11-01 NOTE — H&P (Signed)
Chief Complaint  Patient presents with  . Pre-op Exam  Right total hip arthroplasty schedule 11/01/20    History of the Present Illness: Lorraine Mills is a 70 y.o. female here today for history and physical for right total hip arthroplasty with Dr. Hessie Knows on 11/01/2020. Patient has had greater than 1 year of progressive right hip pain. X-rays from 04/19/2020 show severe superior joint osteoarthritis, extensive osteophytes around the femoral head and marked osteophytes of the medial inferior acetabulum. Lateral view shows large subchondral cyst in the head and acetabulum. Overall severe osteoarthritis throughout the right hip with high offset hip and varus neck. Patient's pain has interfered with her activities of daily living and quality of life. Pain is located in her groin, lateral hip and thigh.  The patient states she has 6 steps at home. She states she can not wear her tennis shoes.  The patient has no history of blood clots. She takes Xanax on occasion. She takes Allegra occasionally. She takes Flonase as needed, Synthroid, omeprazole, and prednisolone. She has high cholesterol.  The patient is a retired, and was previously a Freight forwarder at [employer].   I have reviewed past medical, surgical, social and family history, and allergies as documented in the EMR.  Past Medical History: Past Medical History:  Diagnosis Date  . Anxiety  . Chicken pox  . Corneal transplant status  . Depression 01/31/2016  Last Assessment & Plan: Now managed with zoloft and alprazolam and trazodone. The risks and benefits of benzodiazepine use were reviewed with patient today including excessive sedation leading to respiratory depression, impaired thinking/driving, and addiction. No changes today  . Dyslipidemia (high LDL; low HDL) 12/01/2013  Last Assessment & Plan: Taking fenofibrate. Using the Framingham risk calculator, her 10 year risk of coronary artery disease is 9.7%.will recheck in 6 months after  weight loss/  . Goiter  . Hyperlipidemia  . Hypothyroidism  . Obesity (BMI 30.0-34.9), unspecified 12/01/2013  Last Assessment & Plan: I have addressed BMI and recommended a low glycemic index diet. I have also recommended that patient start exercising with a goal of 30 minutes of aerobic exercise a minimum of 5 days per week.  . Seasonal allergies  . Seizure disorder (CMS-HCC) 03/28/2015  Last Assessment & Plan: Witnessed, With normla CT head, Normal EEG , No recurrence.  . Tubular adenoma of colon, unspecified 01/31/2016  Last Assessment & Plan: found by 011 colonoscopy Vira Agar). Referral to Dr Bary Castilla for follow up due last year.   Past Surgical History: Past Surgical History:  Procedure Laterality Date  . BREAST EXCISIONAL BIOPSY Left 1985  . CATARACT EXTRACTION Right  . COLONOSCOPY 08/17/2009  Adenomatous Polyp: CBF 08/2014; recall ltr mailed 06/16/2014 (dw)  . COLONOSCOPY 03/19/2016  . CORNEAL TRANSPLANT  . CORNEAL TRANSPLANT Right  . HYSTERECTOMY VAGINAL  . OOPHORECTOMY Bilateral 2001  . WISDOM TEETH   Past Family History: Family History  Problem Relation Age of Onset  . Cancer Mother  . Ovarian cancer Mother  . Stroke Father  . Diabetes Sister  . Cancer Sister  . Diabetes type II Sister  . Heart disease Sister  . Breast cancer Sister  . Atrial fibrillation (Abnormal heart rhythm sometimes requiring treatment with blood thinners) Brother  . Thyroid disease Other   Medications: Current Outpatient Medications Ordered in Epic  Medication Sig Dispense Refill  . ALPRAZolam (XANAX) 0.5 MG tablet 0.5 mg nightly as needed (Patient taking differently: 0.5 mg nightly as needed )  . fexofenadine-pseudoephedrine (ALLEGRA-D 24H)  180-240 mg ER tablet Take by mouth as needed  . fluticasone propionate (FLONASE) 50 mcg/actuation nasal spray by Nasal route as needed  . levothyroxine (SYNTHROID, LEVOTHROID) 75 MCG tablet 75 mcg once daily (Patient taking differently: 75 mcg once daily  )  . omeprazole (PRILOSEC) 20 MG DR capsule 20 mg once daily as needed  . prednisoLONE acetate (PRED FORTE) 1 % ophthalmic suspension Place 1 drop into both eyes 2 (two) times daily (Patient taking differently: Place 1 drop into both eyes 2 (two) times daily )  . rosuvastatin (CRESTOR) 10 MG tablet 10 mg once daily  . traZODone (DESYREL) 50 MG tablet 50 mg nightly 0   No current Epic-ordered facility-administered medications on file.   Allergies: Allergies  Allergen Reactions  . Latex Anaphylaxis and Rash  . Levaquin [Levofloxacin] Rash    Body mass index is 29.74 kg/m.  Review of Systems: A comprehensive 14 point ROS was performed, reviewed, and the pertinent orthopaedic findings are documented in the HPI.  Vitals:  10/24/20 1022  BP: 118/76    General Physical Examination:  General:  Well developed, well nourished, no apparent distress, normal affect, normal gait with slight antalgic gait with no assistive devices  HEENT: Head normocephalic, atraumatic, PERRL.   Abdomen: Soft, non tender, non distended, Bowel sounds present.  Heart: Examination of the heart reveals regular, rate, and rhythm. There is no murmur noted on ascultation. There is a normal apical pulse.  Lungs: Lungs are clear to auscultation. There is no wheeze, rhonchi, or crackles. There is normal expansion of bilateral chest walls.   Musculoskeletal Examination:  No swelling or edema throughout the right lower extremity. On exam, right hip range of motion of -10 to 20 degrees. Moderate to severe pain with right hip internal rotation  Radiographs: X-rays from 04/19/2020 show severe superior joint osteoarthritis, extensive osteophytes around the femoral head and marked osteophytes of the medial inferior acetabulum. Lateral view shows large subchondral cyst in the head and acetabulum. Overall severe osteoarthritis throughout the right hip with high offset hip and varus neck.  Assessment: ICD-10-CM  1.  Primary osteoarthritis of right hip M16.11   Plan:  34. 70 year old female with greater than 1 year right hip pain. She has advanced right hip osteoarthritis. Pain has interfered with quality of life and activities daily living. Patient would like to proceed with a right total hip arthroplasty. Risks, benefits, complications of a right total hip arthroplasty have been discussed with the patient. Patient has agreed and consented the procedure with Dr. Hessie Knows on 11/01/2020.   Electronically signed by Feliberto Gottron, [place] at 10/24/2020 10:51 AM EDT Reviewed  H+P. No changes noted.

## 2020-11-02 ENCOUNTER — Encounter: Payer: Self-pay | Admitting: Orthopedic Surgery

## 2020-11-02 DIAGNOSIS — M65331 Trigger finger, right middle finger: Secondary | ICD-10-CM | POA: Insufficient documentation

## 2020-11-02 DIAGNOSIS — N3941 Urge incontinence: Secondary | ICD-10-CM | POA: Insufficient documentation

## 2020-11-02 LAB — CBC
HCT: 31.4 % — ABNORMAL LOW (ref 36.0–46.0)
Hemoglobin: 11 g/dL — ABNORMAL LOW (ref 12.0–15.0)
MCH: 32.7 pg (ref 26.0–34.0)
MCHC: 35 g/dL (ref 30.0–36.0)
MCV: 93.5 fL (ref 80.0–100.0)
Platelets: 148 10*3/uL — ABNORMAL LOW (ref 150–400)
RBC: 3.36 MIL/uL — ABNORMAL LOW (ref 3.87–5.11)
RDW: 11.8 % (ref 11.5–15.5)
WBC: 8.5 10*3/uL (ref 4.0–10.5)
nRBC: 0 % (ref 0.0–0.2)

## 2020-11-02 LAB — BASIC METABOLIC PANEL
Anion gap: 7 (ref 5–15)
BUN: 12 mg/dL (ref 8–23)
CO2: 24 mmol/L (ref 22–32)
Calcium: 8.2 mg/dL — ABNORMAL LOW (ref 8.9–10.3)
Chloride: 108 mmol/L (ref 98–111)
Creatinine, Ser: 0.67 mg/dL (ref 0.44–1.00)
GFR, Estimated: >60 mL/min (ref >60)
Glucose, Bld: 122 mg/dL — ABNORMAL HIGH (ref 70–99)
Potassium: 4 mmol/L (ref 3.5–5.1)
Sodium: 139 mmol/L (ref 135–145)

## 2020-11-02 LAB — SURGICAL PATHOLOGY

## 2020-11-02 MED ORDER — DOCUSATE SODIUM 100 MG PO CAPS: 100.0000 mg | ORAL_CAPSULE | Freq: Two times a day (BID) | ORAL | 0 refills | Status: DC

## 2020-11-02 MED ORDER — TRAMADOL HCL 50 MG PO TABS: 50.0000 mg | ORAL_TABLET | Freq: Four times a day (QID) | ORAL | 0 refills | Status: DC | PRN

## 2020-11-02 MED ORDER — METHOCARBAMOL 500 MG PO TABS: 500.0000 mg | ORAL_TABLET | Freq: Four times a day (QID) | ORAL | 0 refills | Status: DC | PRN

## 2020-11-02 MED ORDER — OXYCODONE HCL 10 MG PO TABS: 5.0000 mg | ORAL_TABLET | ORAL | 0 refills | Status: DC | PRN

## 2020-11-02 MED ORDER — ENOXAPARIN SODIUM 40 MG/0.4ML IJ SOSY: 40.0000 mg | PREFILLED_SYRINGE | INTRAMUSCULAR | 0 refills | Status: DC

## 2020-11-02 NOTE — Assessment & Plan Note (Signed)
5 yr follow up will be due this year

## 2020-11-02 NOTE — Assessment & Plan Note (Signed)
She has recovered fully

## 2020-11-02 NOTE — Progress Notes (Signed)
   Subjective: 1 Day Post-Op Procedure(s) (LRB): TOTAL HIP ARTHROPLASTY ANTERIOR APPROACH (Right) Patient reports pain as mild.   Patient is well, and has had no acute complaints or problems Denies any CP, SOB, ABD pain. We will continue therapy today.  Plan is to go Home after hospital stay.  Objective: Vital signs in last 24 hours: Temp:  [96.4 F (35.8 C)-98.4 F (36.9 C)] 98.4 F (36.9 C) (05/27 0754) Pulse Rate:  [52-103] 65 (05/27 0754) Resp:  [9-21] 16 (05/27 0754) BP: (74-149)/(34-71) 109/50 (05/27 0754) SpO2:  [95 %-100 %] 97 % (05/27 0754) FiO2 (%):  [21 %] 21 % (05/26 1234) Weight:  [71.2 kg] 71.2 kg (05/26 0820)  Intake/Output from previous day: 05/26 0701 - 05/27 0700 In: 863.2 [I.V.:863.2] Out: 3125 [Urine:2725; Blood:400] Intake/Output this shift: No intake/output data recorded.  Recent Labs    11/01/20 1253 11/02/20 0511  HGB 12.9 11.0*   Recent Labs    11/01/20 1253 11/02/20 0511  WBC 11.0* 8.5  RBC 4.01 3.36*  HCT 37.9 31.4*  PLT 146* 148*   Recent Labs    11/01/20 1253 11/02/20 0511  NA  --  139  K  --  4.0  CL  --  108  CO2  --  24  BUN  --  12  CREATININE 0.72 0.67  GLUCOSE  --  122*  CALCIUM  --  8.2*   No results for input(s): LABPT, INR in the last 72 hours.  EXAM General - Patient is Alert, Appropriate and Oriented Extremity - Neurovascular intact Sensation intact distally Intact pulses distally Dorsiflexion/Plantar flexion intact No cellulitis present Compartment soft Dressing - dressing C/D/I and no drainage, Praveena intact without drainage Motor Function - intact, moving foot and toes well on exam.   Past Medical History:  Diagnosis Date  . Anxiety   . Arthritis   . Depression 01/31/2016  . Goiter   . Hyperlipidemia    TGs  . Hypothyroidism   . Seasonal allergies     Assessment/Plan:   1 Day Post-Op Procedure(s) (LRB): TOTAL HIP ARTHROPLASTY ANTERIOR APPROACH (Right) Active Problems:   S/P hip  replacement  Estimated body mass index is 29.66 kg/m as calculated from the following:   Height as of this encounter: 5\' 1"  (1.549 m).   Weight as of this encounter: 71.2 kg. Advance diet Up with therapy  Work on bowel movement Labs and vital signs are stable Pain well controlled Care management to assist with discharge to home with home health PT pending completion of PT goals  DVT Prophylaxis - Lovenox, TED hose and SCDs Weight-Bearing as tolerated to right leg   T. Rachelle Hora, PA-C [place] 11/02/2020, 8:18 AM

## 2020-11-02 NOTE — Assessment & Plan Note (Signed)
Early,  With no restriction of  ROM thus far.

## 2020-11-02 NOTE — Assessment & Plan Note (Signed)
Secondary to advanced DJD which is limiting her activities.  For THR in a few days

## 2020-11-02 NOTE — Assessment & Plan Note (Signed)
a1c has been well  below the threshhold for prediabetes.   She is following a low glycemic index diet but not exercising.   Lab Results  Component Value Date   HGBA1C 5.5 10/04/2019

## 2020-11-02 NOTE — Evaluation (Signed)
Occupational Therapy Evaluation Patient Details Name: Lorraine Mills MRN: [id number] DOB: [birth year] Today's Date: 11/02/2020    History of Present Illness AISSA LISOWSKI is a 70 y.o. female s/p right total hip arthroplasty on 11/01/2020. PMH: Anxiety/Depression, Hypothyroidism, Seizure   Clinical Impression   Ms Sherman was seen for OT evaluation this date. Prior to hospital admission, pt was Independent for mobility and ADLs.  Pt lives with husband who is active and available 24/7. Pt presents to acute OT demonstrating impaired ADL performance and functional mobility 2/2 decreased activity tolerance, decreased LB access, and functional strength/balance deficits. Pt currently requires MOD A exit R side of bed. SBA + RW for ADL t/f. MOD A for LBD seated EOB, husband able to assist. CGA for simulated perihygiene seated EOC. Pt would benefit from skilled OT to address noted impairments and functional limitations (see below for any additional details) in order to maximize safety and independence while minimizing falls risk and caregiver burden. Upon hospital discharge, recommend HHOT to maximize pt safety and return to functional independence during meaningful occupations of daily life.     Follow Up Recommendations  Home health OT    Equipment Recommendations  None recommended by OT    Recommendations for Other Services       Precautions / Restrictions Precautions Precautions: Fall;Anterior Hip Restrictions Weight Bearing Restrictions: Yes RLE Weight Bearing: Weight bearing as tolerated      Mobility Bed Mobility Overal bed mobility: Needs Assistance Bed Mobility: Supine to Sit     Supine to sit: Mod assist          Transfers Overall transfer level: Needs assistance Equipment used: Rolling walker (2 wheeled) Transfers: Sit to/from Stand Sit to Stand: Min guard              Balance Overall balance assessment: Needs assistance Sitting-balance support: No upper extremity  supported;Feet supported Sitting balance-Leahy Scale: Fair     Standing balance support: Bilateral upper extremity supported Standing balance-Leahy Scale: Fair                             ADL either performed or assessed with clinical judgement   ADL Overall ADL's : Needs assistance/impaired                                       General ADL Comments: SBA + RW for ADL t/f. MOD A for LBD seated EOB, husband able to assist. CGA for simulated perihygiene seated EOC.                  Pertinent Vitals/Pain Pain Assessment: 0-10 Pain Score: 5  Pain Location: R hip Pain Descriptors / Indicators: Discomfort;Dull Pain Intervention(s): Limited activity within patient's tolerance;Premedicated before session;Repositioned     Hand Dominance Right   Extremity/Trunk Assessment Upper Extremity Assessment Upper Extremity Assessment: Overall WFL for tasks assessed   Lower Extremity Assessment Lower Extremity Assessment: Generalized weakness       Communication Communication Communication: No difficulties   Cognition Arousal/Alertness: Awake/alert Behavior During Therapy: WFL for tasks assessed/performed Overall Cognitive Status: Within Functional Limits for tasks assessed                                     General Comments  Exercises Exercises: Other exercises Other Exercises Other Exercises: Pt educated re: OT role, DME recs, d/c recs, falls prevention, adapted dressing techniques Other Exercises: LBD, sup>sit, sit<>stnad, sitting/standing balance/tolerance, ~15 ft mobility   Shoulder Instructions      Home Living Family/patient expects to be discharged to:: Private residence Living Arrangements: Spouse/significant other Available Help at Discharge: Family;Available 24 hours/day Type of Home: House Home Access: Stairs to enter [employer] of Steps: 3 Entrance Stairs-Rails: None Home Layout: One level      Bathroom Shower/Tub: Occupational psychologist: Handicapped height     Home Equipment: Environmental consultant - 2 wheels;Walker - 4 wheels;Cane - single point;Bedside commode          Prior Functioning/Environment Level of Independence: Independent        Comments: Occassional use of 4WW 2/2 pain        OT Problem List: Decreased range of motion;Decreased activity tolerance;Impaired balance (sitting and/or standing)      OT Treatment/Interventions: Self-care/ADL training;Therapeutic exercise;Energy conservation;DME and/or AE instruction;Therapeutic activities;Patient/family education;Balance training    OT Goals(Current goals can be found in the care plan section) Acute Rehab OT Goals Patient Stated Goal: to go home today OT Goal Formulation: With patient/family Time For Goal Achievement: 11/16/20 Potential to Achieve Goals: Good ADL Goals Pt Will Perform Grooming: standing;Independently Pt Will Perform Lower Body Dressing: with modified independence;sitting/lateral leans Pt Will Perform Toileting - Clothing Manipulation and hygiene: Independently;sitting/lateral leans  OT Frequency: Min 1X/week    AM-PAC OT "6 Clicks" Daily Activity     Outcome Measure Help from another person eating meals?: None Help from another person taking care of personal grooming?: A Little Help from another person toileting, which includes using toliet, bedpan, or urinal?: A Little Help from another person bathing (including washing, rinsing, drying)?: A Little Help from another person to put on and taking off regular upper body clothing?: None Help from another person to put on and taking off regular lower body clothing?: A Lot 6 Click Score: 19   End of Session Equipment Utilized During Treatment: Rolling walker  Activity Tolerance: Patient tolerated treatment well Patient left: in chair;with call bell/phone within reach;with family/visitor present;Other (comment) (PT in room end of session)  OT  Visit Diagnosis: Other abnormalities of gait and mobility (R26.89)                Time: 6468-0321 OT Time Calculation (min): 19 min Charges:  OT General Charges $OT Visit: 1 Visit OT Evaluation $OT Eval Low Complexity: 1 Low OT Treatments $Self Care/Home Management : 8-22 mins  Dessie Coma, M.S. OTR/L  11/02/20, 12:48 PM  ascom [phone]

## 2020-11-02 NOTE — Progress Notes (Signed)
Physical Therapy Treatment Patient Details Name: Lorraine Mills MRN: [id number] DOB: 12/14/1950 Today's Date: 11/02/2020    History of Present Illness Lorraine Mills is a 70 y.o. female s/p right total hip arthroplasty on 11/01/2020. PMH: Anxiety/Depression, Hypothyroidism, Seizure    PT Comments    Patient making good progress towards all mobility goals. Has completed full lap around nursing station and safely negotiated steps (1+1+1) required to enter/exit home at discharge. Patient/husband comfortable with functional performance and level of assist required; voices understanding of all information provided.  Hopeful for discharge home this date; MD informed/aware of patient progress and goal.     Follow Up Recommendations  Home health PT     Equipment Recommendations       Recommendations for Other Services       Precautions / Restrictions Precautions Precautions: Fall;Anterior Hip Restrictions Weight Bearing Restrictions: Yes RLE Weight Bearing: Weight bearing as tolerated    Mobility  Bed Mobility Overal bed mobility: Needs Assistance Bed Mobility: Sit to Supine     Supine to sit: Mod assist Sit to supine: Supervision   General bed mobility comments: using 'hook technique' to assist with R LE elevation in/out of bed    Transfers Overall transfer level: Needs assistance Equipment used: Rolling walker (2 wheeled) Transfers: Sit to/from Stand Sit to Stand: Supervision         General transfer comment: good carry-over of technique; maintains R LE anterior to BOS with movement transitions to minimize pain  Ambulation/Gait Ambulation/Gait assistance: Supervision Gait Distance (Feet):  (50' x2) Assistive device: Rolling walker (2 wheeled)       General Gait Details: improving R LE heel strike/toe off, improved R hip/knee flexion throughout gait cycle.  Maintains slow, cautious performance; good control and safety awareness.   Stairs Stairs: Yes Stairs  assistance: Min guard Stair Management: With walker Number of Stairs:  (1 platform step x2) General stair comments: education for technique (backwards with RW); good control/stability in modified R SLS; min assist for walker position/management.   Wheelchair Mobility    Modified Rankin (Stroke Patients Only)       Balance Overall balance assessment: Needs assistance Sitting-balance support: No upper extremity supported;Feet supported Sitting balance-Leahy Scale: Good     Standing balance support: Bilateral upper extremity supported Standing balance-Leahy Scale: Fair                              Cognition Arousal/Alertness: Awake/alert Behavior During Therapy: WFL for tasks assessed/performed Overall Cognitive Status: Within Functional Limits for tasks assessed                                        Exercises Other Exercises Other Exercises: Issued handout with written/pictorial descriptions of supine therex for use as HEP (provided link to [facility] program as well); patient voiced understanding. Other Exercises: Verbally reviewed car transfer technique, technique for in/out of elevated bed surface (to simulate home environment); reviewed movement precautions, positioning and general activity progression.  Patient/husband voiced understanding of all information.    General Comments        Pertinent Vitals/Pain Pain Assessment: 0-10 Pain Score: 4  Pain Location: R hip Pain Descriptors / Indicators: Aching;Grimacing;Guarding Pain Intervention(s): Limited activity within patient's tolerance;Monitored during session;Premedicated before session;Repositioned    Home Living Family/patient expects to be discharged to:: Private residence Living Arrangements:  Spouse/significant other Available Help at Discharge: Family;Available 24 hours/day Type of Home: House Home Access: Stairs to enter Entrance Stairs-Rails: None Home Layout: One level Home  Equipment: Environmental consultant - 2 wheels;Walker - 4 wheels;Cane - single point;Bedside commode      Prior Function Level of Independence: Independent      Comments: Indep with ADLs, household and community mobilization without assist device; denies fall history   PT Goals (current goals can now be found in the care plan section) Acute Rehab PT Goals Patient Stated Goal: to go home today PT Goal Formulation: With patient Time For Goal Achievement: 11/16/20 Potential to Achieve Goals: Good Progress towards PT goals: Progressing toward goals    Frequency    BID      PT Plan Current plan remains appropriate    Co-evaluation              AM-PAC PT "6 Clicks" Mobility   Outcome Measure  Help needed turning from your back to your side while in a flat bed without using bedrails?: None Help needed moving from lying on your back to sitting on the side of a flat bed without using bedrails?: None Help needed moving to and from a bed to a chair (including a wheelchair)?: None Help needed standing up from a chair using your arms (e.g., wheelchair or bedside chair)?: None Help needed to walk in hospital room?: None Help needed climbing 3-5 steps with a railing? : A Little 6 Click Score: 23    End of Session Equipment Utilized During Treatment: Gait belt Activity Tolerance: Patient tolerated treatment well Patient left: in bed;with call bell/phone within reach;with bed alarm set Nurse Communication: Mobility status PT Visit Diagnosis: Muscle weakness (generalized) (M62.81);Difficulty in walking, not elsewhere classified (R26.2)     Time: 1410-1433 PT Time Calculation (min) (ACUTE ONLY): 23 min  Charges:  $Gait Training: 8-22 mins $Therapeutic Exercise: 8-22 mins $Therapeutic Activity: 8-22 mins                     Consuella Scurlock H. Owens Shark, PT, DPT, NCS 11/02/20, 3:37 PM [phone]

## 2020-11-02 NOTE — Discharge Instructions (Signed)

## 2020-11-02 NOTE — Assessment & Plan Note (Addendum)
Thyroid function has been WNL on current dose of 75 mcg daily .  No current changes needed.    Lab Results  Component Value Date   TSH 2.11 02/02/2020

## 2020-11-02 NOTE — Discharge Summary (Signed)
Physician Discharge Summary  Patient ID: Lorraine Mills MRN: [id number] DOB/AGE: 70-Jul-1952 70 y.o.  Admit date: 11/01/2020 Discharge date: 11/02/2020 Admission Diagnoses:  S/P hip replacement [Z96.649]   Discharge Diagnoses: Patient Active Problem List   Diagnosis Date Noted  . S/P hip replacement 11/01/2020  . COVID-19 virus infection 07/04/2020  . Lateral pain of right hip 04/01/2020  . Insomnia due to anxiety and fear 01/29/2019  . Contact dermatitis and eczema due to plant 03/28/2018  . Hair loss 09/30/2017  . Impaired fasting glucose 09/03/2017  . Personal history of colonic polyps 02/18/2016  . Tubular adenoma of colon 01/31/2016  . S/P hysterectomy with oophorectomy 05/27/2014  . Dyslipidemia (high LDL; low HDL) 12/01/2013  . Overweight (BMI 25.0-29.9) 12/01/2013  . Encounter for preventive health examination 10/07/2012  . Menopause present, declines hormone replacement therapy 10/03/2011  . Hypothyroidism 10/03/2011    Past Medical History:  Diagnosis Date  . Anxiety   . Arthritis   . Depression 01/31/2016  . Goiter   . Hyperlipidemia    TGs  . Hypothyroidism   . Seasonal allergies      Transfusion: None   Consultants (if any):   Discharged Condition: Improved  Hospital Course: Lorraine Mills is an 70 y.o. female who was admitted 11/01/2020 with a diagnosis of right hip osteoarthritis and went to the operating room on 11/01/2020 and underwent the above named procedures.    Surgeries: Procedure(s): TOTAL HIP ARTHROPLASTY ANTERIOR APPROACH on 11/01/2020 Patient tolerated the surgery well. Taken to PACU where she was stabilized and then transferred to the orthopedic floor.  Started on Lovenox 40 mg q 24 hrs. Foot pumps applied bilaterally at 80 mm. Heels elevated on bed with rolled towels. No evidence of DVT. Negative Homan. Physical therapy started on day #1 for gait training and transfer. OT started day #1 for ADL and assisted devices.  Patient's foley was d/c  on day #1.   On post op day #1 patient was stable and ready for discharge to home with HHPT.  Implants: Medacta AMIS size 2, standard stem, 50 mm Mpact DM cup and liner with metal S 28 mm head  She was given perioperative antibiotics:  Anti-infectives (From admission, onward)   Start     Dose/Rate Route Frequency Ordered Stop   11/01/20 1400  ceFAZolin (ANCEF) IVPB 2g/100 mL premix        2 g 200 mL/hr over 30 Minutes Intravenous Every 6 hours 11/01/20 1233 11/02/20 0009   11/01/20 0811  ceFAZolin (ANCEF) 2-4 GM/100ML-% IVPB       Note to Pharmacy: Sylvester Harder   : cabinet override      11/01/20 0811 11/02/20 0009   11/01/20 0600  ceFAZolin (ANCEF) IVPB 2g/100 mL premix        2 g 200 mL/hr over 30 Minutes Intravenous On call to O.R. 10/31/20 2303 11/01/20 8563    .  She was given sequential compression devices, early ambulation, and Lovenox for DVT prophylaxis.  She benefited maximally from the hospital stay and there were no complications.    Recent vital signs:  Vitals:   11/02/20 0453 11/02/20 0754  BP: (!) 101/56 (!) 109/50  Pulse: 62 65  Resp: 18 16  Temp: 98.1 F (36.7 C) 98.4 F (36.9 C)  SpO2: 96% 97%    Recent laboratory studies:  Lab Results  Component Value Date   HGB 11.0 (L) 11/02/2020   HGB 12.9 11/01/2020   HGB 14.6 10/24/2020   Lab  Results  Component Value Date   WBC 8.5 11/02/2020   PLT 148 (L) 11/02/2020   No results found for: INR Lab Results  Component Value Date   NA 139 11/02/2020   K 4.0 11/02/2020   CL 108 11/02/2020   CO2 24 11/02/2020   BUN 12 11/02/2020   CREATININE 0.67 11/02/2020   GLUCOSE 122 (H) 11/02/2020    Discharge Medications:   Allergies as of 11/02/2020      Reactions   Latex Anaphylaxis, Rash   Levofloxacin    FLUOROQUINOLONES - unknown reaction      Medication List    STOP taking these medications   ibuprofen 200 MG tablet Commonly known as: ADVIL     TAKE these medications   acetaminophen 500 MG  tablet Commonly known as: TYLENOL Take 500 mg by mouth every 6 (six) hours as needed.   cetirizine 10 MG tablet Commonly known as: ZYRTEC Take 10 mg by mouth daily as needed for allergies.   docusate sodium 100 MG capsule Commonly known as: COLACE Take 1 capsule (100 mg total) by mouth 2 (two) times daily.   enoxaparin 40 MG/0.4ML injection Commonly known as: LOVENOX Inject 0.4 mLs (40 mg total) into the skin daily for 14 days.   fluticasone 50 MCG/ACT nasal spray Commonly known as: FLONASE Place 1-2 sprays into both nostrils daily as needed for allergies or rhinitis.   levothyroxine 75 MCG tablet Commonly known as: SYNTHROID TAKE 1 TABLET EVERY DAY ON EMPTY STOMACHWITH A GLASS OF WATER AT LEAST 30-60 MINBEFORE BREAKFAST What changed: See the new instructions.   methocarbamol 500 MG tablet Commonly known as: ROBAXIN Take 1 tablet (500 mg total) by mouth every 6 (six) hours as needed for muscle spasms.   omeprazole 20 MG capsule Commonly known as: PRILOSEC TAKE 1 CAPSULE BY MOUTH DAILY   Oxycodone HCl 10 MG Tabs Take 0.5-1 tablets (5-10 mg total) by mouth every 4 (four) hours as needed for moderate pain or severe pain.   prednisoLONE acetate 1 % ophthalmic suspension Commonly known as: PRED FORTE Place 1 drop into the right eye daily.   rosuvastatin 10 MG tablet Commonly known as: CRESTOR TAKE ONE TABLET EVERY DAY   traMADol 50 MG tablet Commonly known as: ULTRAM Take 1 tablet (50 mg total) by mouth every 6 (six) hours as needed.   traZODone 50 MG tablet Commonly known as: DESYREL TAKE ONE TABLET AT BEDTIME AS NEEDED FORSLEEP What changed: See the new instructions.            Durable Medical Equipment  (From admission, onward)         Start     Ordered   11/01/20 1234  DME Bedside commode  Once       Question:  Patient needs a bedside commode to treat with the following condition  Answer:  S/P hip replacement   11/01/20 1233   11/01/20 1234  DME 3 n 1   Once        11/01/20 1233   11/01/20 1234  DME Walker rolling  Once       Question Answer Comment  Walker: With 5 Inch Wheels   Patient needs a walker to treat with the following condition S/P hip replacement      11/01/20 1233          Diagnostic Studies: DG HIP OPERATIVE UNILAT W OR W/O PELVIS RIGHT  Result Date: 11/01/2020 CLINICAL DATA:  Right total hip replacement. EXAM: OPERATIVE right  HIP (WITH PELVIS IF PERFORMED) 4 VIEWS TECHNIQUE: Fluoroscopic spot image(s) were submitted for interpretation post-operatively. Radiation exposure index: 4.8 mGy. COMPARISON:  None. FINDINGS: Four intraoperative fluoroscopic images were obtained the right hip. The right acetabular and femoral components appear to be well situated. IMPRESSION: Status post right total hip arthroplasty. Electronically Signed   By: Marijo Conception M.D.   On: 11/01/2020 12:32   DG HIP UNILAT W OR W/O PELVIS 2-3 VIEWS RIGHT  Result Date: 11/01/2020 CLINICAL DATA:  Right hip replacement EXAM: DG HIP (WITH OR WITHOUT PELVIS) 2-3V RIGHT COMPARISON:  None. FINDINGS: Postoperative changes of right total hip arthroplasty. There is postoperative air in the soft tissues. Skin staples are present. A drain is noted. No complication identified. IMPRESSION: Standard postoperative appearance right total hip arthroplasty. Electronically Signed   By: Macy Mis M.D.   On: 11/01/2020 11:33    Disposition: Discharge disposition: 06-Home-Health Care Svc          Follow-up Information    Duanne Guess, PA-C Follow up in 2 week(s).   Specialties: Orthopedic Surgery, Emergency Medicine Contact information: [place] [address] [phone]                Signed: Feliberto Gottron 11/02/2020, 3:14 PM

## 2020-11-02 NOTE — Progress Notes (Signed)
Pt iv removed, [place] changed to [place], pt given hard scripts for oxy, tram, lovanox, colace, and robaxin. Pt family assisting with her getting dressed and collecting belongings. Pt wants to wait til she can get next pain meds to leave- approx 1700.

## 2020-11-02 NOTE — Evaluation (Signed)
Physical Therapy Evaluation Patient Details Name: Lorraine Mills MRN: [id number] DOB: June 10, 1950 Today's Date: 11/02/2020   History of Present Illness  Lorraine Mills is a 70 y.o. female s/p right total hip arthroplasty on 11/01/2020. PMH: Anxiety/Depression, Hypothyroidism, Seizure  Clinical Impression  Patient seated in recliner upon arrival to session, just finished OT evaluation.  Spouse at bedside, supportive and encouraging.  Patient alert and oriented; follows commands and demonstrates good participation with session.  Eager for mobility progression and ultimate discharge home.  Rates pain in R hip 5-6/10 at rest, 6-7/10 with WBing/activities; meds received prior to session.  Fair/good R hip strength (3-/5) and ROM (approx 50% normal ROM) noted; good muscle activation and control noted with isolated therex.  Able to complete sit/stand, basic transfers and gait (200') with RW, cga/close sup.  Demonstrates step to progressing to partially reciprocal stepping pattern; slow and guarded, but no overt buckling or LOB.  Limited R heel strike/toe off, mild vaulting to assist with R LE clearance at times.  Min cuing to increase cadence and normalize gait mechanics as able. Patient very motivated and pleased with progress; anticipate consistent progression towards mobility goals. Would benefit from skilled PT to address above deficits and promote optimal return to PLOF.; Recommend transition to HHPT upon discharge from acute hospitalization.      Follow Up Recommendations Home health PT    Equipment Recommendations       Recommendations for Other Services       Precautions / Restrictions Precautions Precautions: Fall;Anterior Hip Restrictions Weight Bearing Restrictions: Yes RLE Weight Bearing: Weight bearing as tolerated      Mobility  Bed Mobility Overal bed mobility: Needs Assistance Bed Mobility: Supine to Sit     Supine to sit: Mod assist     General bed mobility comments: seated  in recliner beginning/end of treatment session    Transfers Overall transfer level: Needs assistance Equipment used: Rolling walker (2 wheeled) Transfers: Sit to/from Stand Sit to Stand: Min guard         General transfer comment: cuing for hand placement and R LE positioning to minimize pain  Ambulation/Gait Ambulation/Gait assistance: Min guard;Supervision Gait Distance (Feet): 200 Feet Assistive device: Rolling walker (2 wheeled)       General Gait Details: step to progressing to partially reciprocal stepping pattern; slow and guarded, but no overt buckling or LOB.  Limited R heel strike/toe off, mild vaulting to assist with R LE clearance at times.  Min cuing to increase cadence and normalize gait mechanics as able.  Stairs            Wheelchair Mobility    Modified Rankin (Stroke Patients Only)       Balance Overall balance assessment: Needs assistance Sitting-balance support: No upper extremity supported;Feet supported Sitting balance-Leahy Scale: Good     Standing balance support: Bilateral upper extremity supported Standing balance-Leahy Scale: Fair                               Pertinent Vitals/Pain Pain Assessment: 0-10 Pain Score: 7  Pain Location: R hip Pain Descriptors / Indicators: Discomfort;Dull Pain Intervention(s): Limited activity within patient's tolerance;Premedicated before session;Monitored during session;Repositioned    Home Living Family/patient expects to be discharged to:: Private residence Living Arrangements: Spouse/significant other Available Help at Discharge: Family;Available 24 hours/day Type of Home: House Home Access: Stairs to enter Entrance Stairs-Rails: None Entrance Stairs-Number of Steps: 3 Home Layout: One  level Home Equipment: Walker - 2 wheels;Walker - 4 wheels;Cane - single point;Bedside commode      Prior Function Level of Independence: Independent         Comments: Indep with ADLs,  household and community mobilization without assist device; denies fall history     Hand Dominance   Dominant Hand: Right    Extremity/Trunk Assessment   Upper Extremity Assessment Upper Extremity Assessment: Overall WFL for tasks assessed    Lower Extremity Assessment Lower Extremity Assessment:  (R hip grossly 3-/5, generally guarded/limited by pain)       Communication   Communication: No difficulties  Cognition Arousal/Alertness: Awake/alert Behavior During Therapy: WFL for tasks assessed/performed Overall Cognitive Status: Within Functional Limits for tasks assessed                                        General Comments      Exercises Other Exercises Other Exercises: Seated R LE therex, 1x10, active ROM for muscular strength/endurance Other Exercises: Verbally reviewed WBing and movement precautions; educated in safety with transfers and mobility, use of RW with mobility.  Patient voiced understanding of all information.   Assessment/Plan    PT Assessment Patient needs continued PT services  PT Problem List Decreased strength;Decreased range of motion;Decreased balance;Decreased mobility;Decreased activity tolerance;Decreased knowledge of use of DME;Decreased safety awareness;Decreased knowledge of precautions;Decreased skin integrity;Pain       PT Treatment Interventions Gait training;Stair training;Functional mobility training;DME instruction;Therapeutic activities;Therapeutic exercise;Balance training;Patient/family education    PT Goals (Current goals can be found in the Care Plan section)  Acute Rehab PT Goals Patient Stated Goal: to go home today PT Goal Formulation: With patient Time For Goal Achievement: 11/16/20 Potential to Achieve Goals: Good    Frequency BID   Barriers to discharge        Co-evaluation               AM-PAC PT "6 Clicks" Mobility  Outcome Measure Help needed turning from your back to your side while in  a flat bed without using bedrails?: None Help needed moving from lying on your back to sitting on the side of a flat bed without using bedrails?: None Help needed moving to and from a bed to a chair (including a wheelchair)?: A Little Help needed standing up from a chair using your arms (e.g., wheelchair or bedside chair)?: A Little Help needed to walk in hospital room?: A Little Help needed climbing 3-5 steps with a railing? : A Little 6 Click Score: 20    End of Session Equipment Utilized During Treatment: Gait belt Activity Tolerance: Patient tolerated treatment well Patient left: in chair;with family/visitor present;with call bell/phone within reach Nurse Communication: Mobility status PT Visit Diagnosis: Muscle weakness (generalized) (M62.81);Difficulty in walking, not elsewhere classified (R26.2)    Time: 2876-8115 PT Time Calculation (min) (ACUTE ONLY): 24 min   Charges:   PT Evaluation $PT Eval Moderate Complexity: 1 Mod PT Treatments $Therapeutic Exercise: 8-22 mins      Charnee Turnipseed H. Owens Shark, PT, DPT, NCS 11/02/20, 3:25 PM [phone]

## 2020-11-02 NOTE — Assessment & Plan Note (Signed)
She will consider therapy after she recovers from her hip replacement

## 2020-11-04 DIAGNOSIS — F5105 Insomnia due to other mental disorder: Secondary | ICD-10-CM | POA: Diagnosis not present

## 2020-11-04 DIAGNOSIS — F32A Depression, unspecified: Secondary | ICD-10-CM | POA: Diagnosis not present

## 2020-11-04 DIAGNOSIS — L237 Allergic contact dermatitis due to plants, except food: Secondary | ICD-10-CM | POA: Diagnosis not present

## 2020-11-04 DIAGNOSIS — Z6829 Body mass index (BMI) 29.0-29.9, adult: Secondary | ICD-10-CM | POA: Diagnosis not present

## 2020-11-04 DIAGNOSIS — F419 Anxiety disorder, unspecified: Secondary | ICD-10-CM | POA: Diagnosis not present

## 2020-11-04 DIAGNOSIS — E039 Hypothyroidism, unspecified: Secondary | ICD-10-CM | POA: Diagnosis not present

## 2020-11-04 DIAGNOSIS — J302 Other seasonal allergic rhinitis: Secondary | ICD-10-CM | POA: Diagnosis not present

## 2020-11-04 DIAGNOSIS — Z86010 Personal history of colonic polyps: Secondary | ICD-10-CM | POA: Diagnosis not present

## 2020-11-04 DIAGNOSIS — E049 Nontoxic goiter, unspecified: Secondary | ICD-10-CM | POA: Diagnosis not present

## 2020-11-04 DIAGNOSIS — Z471 Aftercare following joint replacement surgery: Secondary | ICD-10-CM | POA: Diagnosis not present

## 2020-11-04 DIAGNOSIS — E785 Hyperlipidemia, unspecified: Secondary | ICD-10-CM | POA: Diagnosis not present

## 2020-11-04 DIAGNOSIS — E669 Obesity, unspecified: Secondary | ICD-10-CM | POA: Diagnosis not present

## 2020-11-04 DIAGNOSIS — Z947 Corneal transplant status: Secondary | ICD-10-CM | POA: Diagnosis not present

## 2020-11-04 DIAGNOSIS — Z96641 Presence of right artificial hip joint: Secondary | ICD-10-CM | POA: Diagnosis not present

## 2020-11-04 DIAGNOSIS — G40909 Epilepsy, unspecified, not intractable, without status epilepticus: Secondary | ICD-10-CM | POA: Diagnosis not present

## 2020-11-04 DIAGNOSIS — Z7901 Long term (current) use of anticoagulants: Secondary | ICD-10-CM | POA: Diagnosis not present

## 2020-11-14 ENCOUNTER — Telehealth: Payer: Self-pay | Admitting: Internal Medicine

## 2020-11-14 NOTE — Telephone Encounter (Signed)
Rejection Reason - Patient Declined - Per patient, issue resolved on it's own. She cancelled the appointment." Vinson Moselle said on Nov 14, 2020 11:04 AM  msg from [facility] general surgery

## 2020-12-14 DIAGNOSIS — M1611 Unilateral primary osteoarthritis, right hip: Secondary | ICD-10-CM | POA: Diagnosis not present

## 2020-12-14 DIAGNOSIS — Z96641 Presence of right artificial hip joint: Secondary | ICD-10-CM | POA: Diagnosis not present

## 2020-12-17 DIAGNOSIS — R278 Other lack of coordination: Secondary | ICD-10-CM | POA: Diagnosis not present

## 2020-12-17 DIAGNOSIS — M6281 Muscle weakness (generalized): Secondary | ICD-10-CM | POA: Diagnosis not present

## 2020-12-17 DIAGNOSIS — R262 Difficulty in walking, not elsewhere classified: Secondary | ICD-10-CM | POA: Diagnosis not present

## 2020-12-17 DIAGNOSIS — Z471 Aftercare following joint replacement surgery: Secondary | ICD-10-CM | POA: Diagnosis not present

## 2020-12-17 DIAGNOSIS — M25551 Pain in right hip: Secondary | ICD-10-CM | POA: Diagnosis not present

## 2020-12-19 ENCOUNTER — Other Ambulatory Visit: Payer: Self-pay | Admitting: Internal Medicine

## 2020-12-19 DIAGNOSIS — R262 Difficulty in walking, not elsewhere classified: Secondary | ICD-10-CM | POA: Diagnosis not present

## 2020-12-19 DIAGNOSIS — R278 Other lack of coordination: Secondary | ICD-10-CM | POA: Diagnosis not present

## 2020-12-19 DIAGNOSIS — M6281 Muscle weakness (generalized): Secondary | ICD-10-CM | POA: Diagnosis not present

## 2020-12-19 DIAGNOSIS — Z471 Aftercare following joint replacement surgery: Secondary | ICD-10-CM | POA: Diagnosis not present

## 2020-12-19 DIAGNOSIS — M25551 Pain in right hip: Secondary | ICD-10-CM | POA: Diagnosis not present

## 2020-12-21 DIAGNOSIS — Z471 Aftercare following joint replacement surgery: Secondary | ICD-10-CM | POA: Diagnosis not present

## 2020-12-21 DIAGNOSIS — M25551 Pain in right hip: Secondary | ICD-10-CM | POA: Diagnosis not present

## 2020-12-21 DIAGNOSIS — R262 Difficulty in walking, not elsewhere classified: Secondary | ICD-10-CM | POA: Diagnosis not present

## 2020-12-21 DIAGNOSIS — M6281 Muscle weakness (generalized): Secondary | ICD-10-CM | POA: Diagnosis not present

## 2020-12-21 DIAGNOSIS — R278 Other lack of coordination: Secondary | ICD-10-CM | POA: Diagnosis not present

## 2020-12-26 DIAGNOSIS — Z471 Aftercare following joint replacement surgery: Secondary | ICD-10-CM | POA: Diagnosis not present

## 2020-12-26 DIAGNOSIS — M6281 Muscle weakness (generalized): Secondary | ICD-10-CM | POA: Diagnosis not present

## 2020-12-26 DIAGNOSIS — R262 Difficulty in walking, not elsewhere classified: Secondary | ICD-10-CM | POA: Diagnosis not present

## 2020-12-26 DIAGNOSIS — R278 Other lack of coordination: Secondary | ICD-10-CM | POA: Diagnosis not present

## 2020-12-26 DIAGNOSIS — M25551 Pain in right hip: Secondary | ICD-10-CM | POA: Diagnosis not present

## 2020-12-28 DIAGNOSIS — R262 Difficulty in walking, not elsewhere classified: Secondary | ICD-10-CM | POA: Diagnosis not present

## 2020-12-28 DIAGNOSIS — M25551 Pain in right hip: Secondary | ICD-10-CM | POA: Diagnosis not present

## 2020-12-28 DIAGNOSIS — R278 Other lack of coordination: Secondary | ICD-10-CM | POA: Diagnosis not present

## 2020-12-28 DIAGNOSIS — Z471 Aftercare following joint replacement surgery: Secondary | ICD-10-CM | POA: Diagnosis not present

## 2020-12-28 DIAGNOSIS — M6281 Muscle weakness (generalized): Secondary | ICD-10-CM | POA: Diagnosis not present

## 2020-12-31 DIAGNOSIS — Z471 Aftercare following joint replacement surgery: Secondary | ICD-10-CM | POA: Diagnosis not present

## 2020-12-31 DIAGNOSIS — R278 Other lack of coordination: Secondary | ICD-10-CM | POA: Diagnosis not present

## 2020-12-31 DIAGNOSIS — M6281 Muscle weakness (generalized): Secondary | ICD-10-CM | POA: Diagnosis not present

## 2020-12-31 DIAGNOSIS — R262 Difficulty in walking, not elsewhere classified: Secondary | ICD-10-CM | POA: Diagnosis not present

## 2020-12-31 DIAGNOSIS — M25551 Pain in right hip: Secondary | ICD-10-CM | POA: Diagnosis not present

## 2021-01-03 DIAGNOSIS — M25551 Pain in right hip: Secondary | ICD-10-CM | POA: Diagnosis not present

## 2021-01-03 DIAGNOSIS — M6281 Muscle weakness (generalized): Secondary | ICD-10-CM | POA: Diagnosis not present

## 2021-01-03 DIAGNOSIS — R278 Other lack of coordination: Secondary | ICD-10-CM | POA: Diagnosis not present

## 2021-01-03 DIAGNOSIS — Z471 Aftercare following joint replacement surgery: Secondary | ICD-10-CM | POA: Diagnosis not present

## 2021-01-03 DIAGNOSIS — R262 Difficulty in walking, not elsewhere classified: Secondary | ICD-10-CM | POA: Diagnosis not present

## 2021-01-07 DIAGNOSIS — R278 Other lack of coordination: Secondary | ICD-10-CM | POA: Diagnosis not present

## 2021-01-07 DIAGNOSIS — Z471 Aftercare following joint replacement surgery: Secondary | ICD-10-CM | POA: Diagnosis not present

## 2021-01-07 DIAGNOSIS — M25551 Pain in right hip: Secondary | ICD-10-CM | POA: Diagnosis not present

## 2021-01-07 DIAGNOSIS — R262 Difficulty in walking, not elsewhere classified: Secondary | ICD-10-CM | POA: Diagnosis not present

## 2021-01-07 DIAGNOSIS — M6281 Muscle weakness (generalized): Secondary | ICD-10-CM | POA: Diagnosis not present

## 2021-01-10 DIAGNOSIS — R278 Other lack of coordination: Secondary | ICD-10-CM | POA: Diagnosis not present

## 2021-01-10 DIAGNOSIS — M6281 Muscle weakness (generalized): Secondary | ICD-10-CM | POA: Diagnosis not present

## 2021-01-10 DIAGNOSIS — R262 Difficulty in walking, not elsewhere classified: Secondary | ICD-10-CM | POA: Diagnosis not present

## 2021-01-10 DIAGNOSIS — Z471 Aftercare following joint replacement surgery: Secondary | ICD-10-CM | POA: Diagnosis not present

## 2021-01-10 DIAGNOSIS — M25551 Pain in right hip: Secondary | ICD-10-CM | POA: Diagnosis not present

## 2021-01-15 DIAGNOSIS — Z471 Aftercare following joint replacement surgery: Secondary | ICD-10-CM | POA: Diagnosis not present

## 2021-01-15 DIAGNOSIS — R262 Difficulty in walking, not elsewhere classified: Secondary | ICD-10-CM | POA: Diagnosis not present

## 2021-01-15 DIAGNOSIS — M6281 Muscle weakness (generalized): Secondary | ICD-10-CM | POA: Diagnosis not present

## 2021-01-15 DIAGNOSIS — M25551 Pain in right hip: Secondary | ICD-10-CM | POA: Diagnosis not present

## 2021-01-15 DIAGNOSIS — R278 Other lack of coordination: Secondary | ICD-10-CM | POA: Diagnosis not present

## 2021-01-17 DIAGNOSIS — R262 Difficulty in walking, not elsewhere classified: Secondary | ICD-10-CM | POA: Diagnosis not present

## 2021-01-17 DIAGNOSIS — M6281 Muscle weakness (generalized): Secondary | ICD-10-CM | POA: Diagnosis not present

## 2021-01-17 DIAGNOSIS — Z471 Aftercare following joint replacement surgery: Secondary | ICD-10-CM | POA: Diagnosis not present

## 2021-01-17 DIAGNOSIS — R278 Other lack of coordination: Secondary | ICD-10-CM | POA: Diagnosis not present

## 2021-01-17 DIAGNOSIS — M25551 Pain in right hip: Secondary | ICD-10-CM | POA: Diagnosis not present

## 2021-01-28 ENCOUNTER — Other Ambulatory Visit: Payer: Self-pay | Admitting: Internal Medicine

## 2021-01-31 DIAGNOSIS — R278 Other lack of coordination: Secondary | ICD-10-CM | POA: Diagnosis not present

## 2021-01-31 DIAGNOSIS — R262 Difficulty in walking, not elsewhere classified: Secondary | ICD-10-CM | POA: Diagnosis not present

## 2021-01-31 DIAGNOSIS — M6281 Muscle weakness (generalized): Secondary | ICD-10-CM | POA: Diagnosis not present

## 2021-01-31 DIAGNOSIS — M25551 Pain in right hip: Secondary | ICD-10-CM | POA: Diagnosis not present

## 2021-01-31 DIAGNOSIS — Z471 Aftercare following joint replacement surgery: Secondary | ICD-10-CM | POA: Diagnosis not present

## 2021-02-26 ENCOUNTER — Telehealth: Payer: Self-pay

## 2021-02-26 NOTE — Telephone Encounter (Signed)
Pt is due for her colonoscopy. LVM for pt to call the office.

## 2021-03-05 ENCOUNTER — Telehealth: Payer: Self-pay | Admitting: Internal Medicine

## 2021-03-05 DIAGNOSIS — Z1211 Encounter for screening for malignant neoplasm of colon: Secondary | ICD-10-CM

## 2021-03-05 NOTE — Telephone Encounter (Signed)
Patient needing a referral for colonscopy at [facility] with Dr. Bary Castilla.

## 2021-03-06 NOTE — Telephone Encounter (Signed)
Sent response from Dr. Derrel Nip to pt in [place].

## 2021-03-08 ENCOUNTER — Ambulatory Visit: Payer: PPO | Admitting: Family

## 2021-03-19 ENCOUNTER — Ambulatory Visit: Payer: PPO | Admitting: Family

## 2021-03-19 ENCOUNTER — Other Ambulatory Visit: Payer: Self-pay | Admitting: Internal Medicine

## 2021-03-20 ENCOUNTER — Other Ambulatory Visit: Payer: Self-pay

## 2021-03-20 ENCOUNTER — Encounter: Payer: Self-pay | Admitting: Internal Medicine

## 2021-03-20 ENCOUNTER — Ambulatory Visit (INDEPENDENT_AMBULATORY_CARE_PROVIDER_SITE_OTHER): Payer: PPO | Admitting: Internal Medicine

## 2021-03-20 VITALS — BP 154/82 | HR 87 | Temp 96.2°F | Ht 61.0 in | Wt 165.2 lb

## 2021-03-20 DIAGNOSIS — R030 Elevated blood-pressure reading, without diagnosis of hypertension: Secondary | ICD-10-CM | POA: Diagnosis not present

## 2021-03-20 DIAGNOSIS — E039 Hypothyroidism, unspecified: Secondary | ICD-10-CM | POA: Diagnosis not present

## 2021-03-20 DIAGNOSIS — Z23 Encounter for immunization: Secondary | ICD-10-CM | POA: Diagnosis not present

## 2021-03-20 DIAGNOSIS — E785 Hyperlipidemia, unspecified: Secondary | ICD-10-CM

## 2021-03-20 DIAGNOSIS — M25571 Pain in right ankle and joints of right foot: Secondary | ICD-10-CM

## 2021-03-20 DIAGNOSIS — F324 Major depressive disorder, single episode, in partial remission: Secondary | ICD-10-CM | POA: Diagnosis not present

## 2021-03-20 DIAGNOSIS — R202 Paresthesia of skin: Secondary | ICD-10-CM

## 2021-03-20 DIAGNOSIS — R200 Anesthesia of skin: Secondary | ICD-10-CM | POA: Diagnosis not present

## 2021-03-20 DIAGNOSIS — G8929 Other chronic pain: Secondary | ICD-10-CM

## 2021-03-20 LAB — CBC WITH DIFFERENTIAL/PLATELET
Basophils Absolute: 0 10*3/uL (ref 0.0–0.1)
Basophils Relative: 0.6 % (ref 0.0–3.0)
Eosinophils Absolute: 0.1 10*3/uL (ref 0.0–0.7)
Eosinophils Relative: 1.4 % (ref 0.0–5.0)
HCT: 42.2 % (ref 36.0–46.0)
Hemoglobin: 14 g/dL (ref 12.0–15.0)
Lymphocytes Relative: 20.6 % (ref 12.0–46.0)
Lymphs Abs: 1.1 10*3/uL (ref 0.7–4.0)
MCHC: 33.2 g/dL (ref 30.0–36.0)
MCV: 92.7 fl (ref 78.0–100.0)
Monocytes Absolute: 0.5 10*3/uL (ref 0.1–1.0)
Monocytes Relative: 9.3 % (ref 3.0–12.0)
Neutro Abs: 3.6 10*3/uL (ref 1.4–7.7)
Neutrophils Relative %: 68.1 % (ref 43.0–77.0)
Platelets: 166 10*3/uL (ref 150.0–400.0)
RBC: 4.55 Mil/uL (ref 3.87–5.11)
RDW: 13.8 % (ref 11.5–15.5)
WBC: 5.2 10*3/uL (ref 4.0–10.5)

## 2021-03-20 LAB — COMPREHENSIVE METABOLIC PANEL
ALT: 17 U/L (ref 0–35)
AST: 20 U/L (ref 0–37)
Albumin: 4.2 g/dL (ref 3.5–5.2)
Alkaline Phosphatase: 59 U/L (ref 39–117)
BUN: 16 mg/dL (ref 6–23)
CO2: 29 mEq/L (ref 19–32)
Calcium: 9.4 mg/dL (ref 8.4–10.5)
Chloride: 105 mEq/L (ref 96–112)
Creatinine, Ser: 0.76 mg/dL (ref 0.40–1.20)
GFR: 79.67 mL/min (ref >60.00)
Glucose, Bld: 90 mg/dL (ref 70–99)
Potassium: 4.3 mEq/L (ref 3.5–5.1)
Sodium: 140 mEq/L (ref 135–145)
Total Bilirubin: 0.4 mg/dL (ref 0.2–1.2)
Total Protein: 6.6 g/dL (ref 6.0–8.3)

## 2021-03-20 LAB — LIPID PANEL
Cholesterol: 162 mg/dL (ref 0–200)
HDL: 42.3 mg/dL (ref >39.00)
NonHDL: 119.23
Total CHOL/HDL Ratio: 4
Triglycerides: 363 mg/dL — ABNORMAL HIGH (ref 0.0–149.0)
VLDL: 72.6 mg/dL — ABNORMAL HIGH (ref 0.0–40.0)

## 2021-03-20 LAB — TSH: TSH: 1.78 u[IU]/mL (ref 0.35–5.50)

## 2021-03-20 LAB — LDL CHOLESTEROL, DIRECT: Direct LDL: 59 mg/dL

## 2021-03-20 LAB — VITAMIN B12: Vitamin B-12: 835 pg/mL (ref 211–911)

## 2021-03-20 LAB — SEDIMENTATION RATE: Sed Rate: 10 mm/hr (ref 0–30)

## 2021-03-20 LAB — MICROALBUMIN / CREATININE URINE RATIO
Creatinine,U: 64.6 mg/dL
Microalb Creat Ratio: 1.1 mg/g (ref 0.0–30.0)
Microalb, Ur: <0.7 mg/dL (ref 0.0–1.9)

## 2021-03-20 LAB — VITAMIN D 25 HYDROXY (VIT D DEFICIENCY, FRACTURES): VITD: 43.02 ng/mL (ref 30.00–100.00)

## 2021-03-20 LAB — URIC ACID: Uric Acid, Serum: 5.7 mg/dL (ref 2.4–7.0)

## 2021-03-20 MED ORDER — GABAPENTIN 100 MG PO CAPS: 100.0000 mg | ORAL_CAPSULE | Freq: Three times a day (TID) | ORAL | 3 refills | Status: DC

## 2021-03-20 MED ORDER — PREDNISONE 10 MG PO TABS: ORAL_TABLET | ORAL | 0 refills | Status: DC

## 2021-03-20 NOTE — Patient Instructions (Signed)
1) please check bp at home or pharmacy 5 times over the next 2 weeks and send me readings  2) referral to podiatry in process  3) for your ankle and foot pain: I am prescribing  a prednisone taper AND  gabapentin to help with your nerve pain.  You can start with 100 mg at bedtime  and either increase the dose at bedtime up to 300 mg gradually OR:  you can add a morning and afternoon dose if needed  If you tolerate this medication and find that it helps your pain , we can increase the dose gradually as needed , up to 2400 mg daily    Please add d up to 2000 mg of acetominophen (tylenol) every day safely  In divided doses (500 mg every 6 hours  Or 1000 mg every 12 hours.)   DO NOT ADD MOTRIN OR ALEVE   LET THE PODIATRIST KNOW IF IT HELPED  EMG/NERVE CONDUCTION STUDIES HAVE BEEN ORDERED

## 2021-03-20 NOTE — Progress Notes (Signed)
Subjective:  Patient ID: Lorraine Mills, female    DOB: 08/08/1950  Age: 70 y.o. MRN: [id number]  CC: The primary encounter diagnosis was Numbness and tingling of right lower extremity. Diagnoses of Major depressive disorder with single episode, in partial remission ([place]), Chronic pain of right ankle, Elevated blood pressure reading in office without diagnosis of hypertension, Acquired hypothyroidism, Dyslipidemia (high LDL; low HDL), and Need for immunization against influenza were also pertinent to this visit.  HPI Lorraine Mills presents for  Chief Complaint  Patient presents with   Ankle Pain    Right ankle pain and swelling since July   1) RIGHT ANKLE PAIN AND SWELLING ,  started suddenly one day in July . No history of fall or twisting.  No unusual activity .  Aggravated by pushing down on gas pedal or pointing toe.  Described as a deep bone pain HURTS 24/7 a, pain is worse at bedtime , last night experiencing a feeling of tingling  that lasted 30 minutes  lateral shin .   Also notes that the ball of her right great toe becomes swollen at times and alters her walking .  Since her right  hip replacement she has had numbness in the upper lateral thigh along with shooting pains in the upper leg.  Told by Rudene Christians it may not resolve.   Not taking NSAIDS because they did not help .   Hitting the gas pedal also hurts.    2) S/P RIGHT HIP REPLACEMENT IN MAY BY MICHEAL MENZ.  Took more than 6 weeks to get off the walker.  Finished PT at [facility] helped her "learn to walk again." tsh  3) follow up on    Outpatient Medications Prior to Visit  Medication Sig Dispense Refill   acetaminophen (TYLENOL) 500 MG tablet Take 500 mg by mouth every 6 (six) hours as needed.     cetirizine (ZYRTEC) 10 MG tablet Take 10 mg by mouth daily as needed for allergies.     fluticasone (FLONASE) 50 MCG/ACT nasal spray Place 1-2 sprays into both nostrils daily as needed for allergies or rhinitis.      levothyroxine (SYNTHROID) 75 MCG tablet TAKE 1 TABLET EVERY DAY ON EMPTY STOMACHWITH A GLASS OF WATER AT LEAST 30-60 MINBEFORE BREAKFAST 90 tablet 1   omeprazole (PRILOSEC) 20 MG capsule TAKE 1 CAPSULE BY MOUTH DAILY 30 capsule 3   prednisoLONE acetate (PRED FORTE) 1 % ophthalmic suspension Place 1 drop into the right eye daily.     rosuvastatin (CRESTOR) 10 MG tablet TAKE ONE TABLET EVERY DAY. 30 tablet 5   traZODone (DESYREL) 50 MG tablet TAKE ONE TABLET AT BEDTIME AS NEEDED FORSLEEP. 90 tablet 1   docusate sodium (COLACE) 100 MG capsule Take 1 capsule (100 mg total) by mouth 2 (two) times daily. 10 capsule 0   enoxaparin (LOVENOX) 40 MG/0.4ML injection Inject 0.4 mLs (40 mg total) into the skin daily for 14 days. 5.6 mL 0   methocarbamol (ROBAXIN) 500 MG tablet Take 1 tablet (500 mg total) by mouth every 6 (six) hours as needed for muscle spasms. 30 tablet 0   oxyCODONE 10 MG TABS Take 0.5-1 tablets (5-10 mg total) by mouth every 4 (four) hours as needed for moderate pain or severe pain. 15 tablet 0   traMADol (ULTRAM) 50 MG tablet Take 1 tablet (50 mg total) by mouth every 6 (six) hours as needed. 30 tablet 0   No facility-administered medications prior  to visit.    Review of Systems;  Patient denies headache, fevers, malaise, unintentional weight loss, skin rash, eye pain, sinus congestion and sinus pain, sore throat, dysphagia,  hemoptysis , cough, dyspnea, wheezing, chest pain, palpitations, orthopnea, edema, abdominal pain, nausea, melena, diarrhea, constipation, flank pain, dysuria, hematuria, urinary  Frequency, nocturia, numbness, tingling, seizures,  Focal weakness, Loss of consciousness,  Tremor, insomnia, depression, anxiety, and suicidal ideation.      Objective:  BP (!) 154/82 (BP Location: Left Arm, Patient Position: Sitting, Cuff Size: Normal)   Pulse 87   Temp (!) 96.2 F (35.7 C) (Temporal)   Ht 5\' 1"  (1.549 m)   Wt 165 lb 3.2 oz (74.9 kg)   SpO2 99%   BMI 31.21 kg/m    BP Readings from Last 3 Encounters:  03/20/21 (!) 154/82  11/02/20 (!) 117/56  10/31/20 140/84    Wt Readings from Last 3 Encounters:  03/20/21 165 lb 3.2 oz (74.9 kg)  11/01/20 157 lb (71.2 kg)  10/31/20 158 lb 6.4 oz (71.8 kg)    General appearance: alert, cooperative and appears stated age Ears: normal TM's and external ear canals both ears Throat: lips, mucosa, and tongue normal; teeth and gums normal Neck: no adenopathy, no carotid bruit, supple, symmetrical, trachea midline and thyroid not enlarged, symmetric, no tenderness/mass/nodules Back: symmetric, no curvature. ROM normal. No CVA tenderness. Lungs: clear to auscultation bilaterally Heart: regular rate and rhythm, S1, S2 normal, no murmur, click, rub or gallop Abdomen: soft, non-tender; bowel sounds normal; no masses,  no organomegaly Pulses: 2+ and symmetric Skin: Skin color, texture, turgor normal. No rashes or lesions Lymph nodes: Cervical, supraclavicular, and axillary nodes normal. MSK: right foot pain with resisted plantarflexion causes pain in the ankle  and on the lateral side of the foot.  Lab Results  Component Value Date   HGBA1C 5.5 10/04/2019   HGBA1C 5.6 02/09/2019   HGBA1C 5.5 03/26/2018    Lab Results  Component Value Date   CREATININE 0.76 03/20/2021   CREATININE 0.67 11/02/2020   CREATININE 0.72 11/01/2020    Lab Results  Component Value Date   WBC 5.2 03/20/2021   HGB 14.0 03/20/2021   HCT 42.2 03/20/2021   PLT 166.0 03/20/2021   GLUCOSE 90 03/20/2021   CHOL 162 03/20/2021   TRIG 363.0 (H) 03/20/2021   HDL 42.30 03/20/2021   LDLDIRECT 59.0 03/20/2021   LDLCALC 60 02/02/2020   ALT 17 03/20/2021   AST 20 03/20/2021   NA 140 03/20/2021   K 4.3 03/20/2021   CL 105 03/20/2021   CREATININE 0.76 03/20/2021   BUN 16 03/20/2021   CO2 29 03/20/2021   TSH 1.78 03/20/2021   HGBA1C 5.5 10/04/2019   MICROALBUR <0.7 03/20/2021    No results found.  Assessment & Plan:   Problem List  Items Addressed This Visit       Unprioritized   Hypothyroidism   Relevant Orders   TSH (Completed)   Dyslipidemia (high LDL; low HDL)   Relevant Orders   Lipid panel (Completed)   Chronic pain of right ankle    Coupled with lateral numbness  In the setting of recent hip surgery,  Suspect peroneal nerve damage . prednisone and gabapentin prescribed for pain management.   EMG/Nerve conduction studies ordered,  Plain films done and serologies to rule out inflammatory arthritis   Lab Results  Component Value Date   WBC 5.2 03/20/2021   HGB 14.0 03/20/2021   HCT 42.2 03/20/2021  MCV 92.7 03/20/2021   PLT 166.0 03/20/2021   Lab Results  Component Value Date   LABURIC 5.7 03/20/2021   Lab Results  Component Value Date   ESRSEDRATE 10 03/20/2021         Relevant Medications   predniSONE (DELTASONE) 10 MG tablet   gabapentin (NEURONTIN) 100 MG capsule   Other Relevant Orders   Sedimentation rate (Completed)   Uric acid (Completed)   VITAMIN D 25 Hydroxy (Vit-D Deficiency, Fractures) (Completed)   CBC with Differential/Platelet (Completed)   Ambulatory referral to Podiatry   NCV with EMG(electromyography)   Elevated blood pressure reading in office without diagnosis of hypertension    She has no history of hypertension but has had several elevated readings.  She has been asked to check her pressures at home and submit readings for evaluation. Renal function will be checked today  Lab Results  Component Value Date   CREATININE 0.76 03/20/2021         Relevant Orders   Microalbumin / creatinine urine ratio (Completed)   Comprehensive metabolic panel (Completed)   Numbness and tingling of right lower extremity - Primary    Suspect peroneal nerve damage from recent orthopedic surgery  Referral to Neurology for EMG /Nerve conduction studies       Relevant Orders   Vitamin B12 (Completed)   Folate RBC   NCV with EMG(electromyography)   Other Visit Diagnoses      Major depressive disorder with single episode, in partial remission ([place])   (Chronic)     Need for immunization against influenza       Relevant Orders   Flu Vaccine QUAD High Dose(Fluad) (Completed)       I have discontinued [facility] Oxycodone HCl, traMADol, docusate sodium, enoxaparin, and methocarbamol. I am also having her start on predniSONE and gabapentin. Additionally, I am having her maintain her prednisoLONE acetate, fluticasone, cetirizine, acetaminophen, omeprazole, traZODone, levothyroxine, and rosuvastatin.  Meds ordered this encounter  Medications   predniSONE (DELTASONE) 10 MG tablet    Sig: 6 tablets on Day 1 , then reduce by 1 tablet daily until gone    Dispense:  21 tablet    Refill:  0   gabapentin (NEURONTIN) 100 MG capsule    Sig: Take 1 capsule (100 mg total) by mouth 3 (three) times daily.    Dispense:  90 capsule    Refill:  3    Medications Discontinued During This Encounter  Medication Reason   docusate sodium (COLACE) 100 MG capsule Patient Preference   enoxaparin (LOVENOX) 40 MG/0.4ML injection Completed Course   methocarbamol (ROBAXIN) 500 MG tablet Completed Course   oxyCODONE 10 MG TABS No longer needed (for PRN medications)   traMADol (ULTRAM) 50 MG tablet No longer needed (for PRN medications)    Follow-up: Return in about 4 weeks (around 04/17/2021).   [name], MD

## 2021-03-21 DIAGNOSIS — R202 Paresthesia of skin: Secondary | ICD-10-CM | POA: Insufficient documentation

## 2021-03-21 DIAGNOSIS — R030 Elevated blood-pressure reading, without diagnosis of hypertension: Secondary | ICD-10-CM | POA: Insufficient documentation

## 2021-03-21 DIAGNOSIS — G8929 Other chronic pain: Secondary | ICD-10-CM | POA: Insufficient documentation

## 2021-03-21 DIAGNOSIS — R200 Anesthesia of skin: Secondary | ICD-10-CM | POA: Insufficient documentation

## 2021-03-21 NOTE — Assessment & Plan Note (Signed)
She has no history of hypertension but has had several elevated readings.  She has been asked to check her pressures at home and submit readings for evaluation. Renal function will be checked today  Lab Results  Component Value Date   CREATININE 0.76 03/20/2021

## 2021-03-21 NOTE — Assessment & Plan Note (Signed)
Suspect peroneal nerve damage from recent orthopedic surgery  Referral to Neurology for EMG /Nerve conduction studies

## 2021-03-21 NOTE — Assessment & Plan Note (Addendum)
Coupled with lateral numbness  In the setting of recent hip surgery,  Suspect peroneal nerve damage . prednisone and gabapentin prescribed for pain management.   EMG/Nerve conduction studies ordered,  Plain films done and serologies to rule out inflammatory arthritis   Lab Results  Component Value Date   WBC 5.2 03/20/2021   HGB 14.0 03/20/2021   HCT 42.2 03/20/2021   MCV 92.7 03/20/2021   PLT 166.0 03/20/2021   Lab Results  Component Value Date   LABURIC 5.7 03/20/2021   Lab Results  Component Value Date   ESRSEDRATE 10 03/20/2021

## 2021-03-27 ENCOUNTER — Other Ambulatory Visit: Payer: Self-pay

## 2021-03-27 ENCOUNTER — Ambulatory Visit (INDEPENDENT_AMBULATORY_CARE_PROVIDER_SITE_OTHER): Payer: PPO

## 2021-03-27 ENCOUNTER — Ambulatory Visit: Payer: PPO | Admitting: Podiatry

## 2021-03-27 DIAGNOSIS — M25571 Pain in right ankle and joints of right foot: Secondary | ICD-10-CM | POA: Diagnosis not present

## 2021-03-27 DIAGNOSIS — M25471 Effusion, right ankle: Secondary | ICD-10-CM

## 2021-03-27 DIAGNOSIS — R600 Localized edema: Secondary | ICD-10-CM

## 2021-03-27 DIAGNOSIS — M7751 Other enthesopathy of right foot: Secondary | ICD-10-CM

## 2021-03-27 NOTE — Progress Notes (Signed)
  Subjective:  Patient ID: Lorraine Mills, female    DOB: [birth year],  MRN: [id number]  Chronic right ankle pain after right hip replacement in May 22.  She started having pain and swelling in the right foot and ankle.  70 y.o. female presents with the above complaint. History confirmed with patient.  This began after her recent right hip replacement.  Physical burning tingling pain and aching pain on the front of the ankle  Objective:  Physical Exam: warm, good capillary refill, no trophic changes or ulcerative lesions, normal DP and PT pulses, and normal sensory exam. Left Foot: normal exam, no swelling, tenderness, instability; ligaments intact, full range of motion of all ankle/foot joints Right Foot: She has pain over the ATFL CFL and anterior joint line, no reproducible neuritic symptoms no pain on palpation of the peroneal and posterior tibial and anterior tibial tendons    Radiographs: Multiple views x-ray of the right ankle and foot: no fracture, dislocation, swelling or degenerative changes noted and os peroneum is noted Assessment:   1. Localized edema   2. Tendonitis of ankle, right   3. Pain and swelling of right ankle      Plan:  Patient was evaluated and treated and all questions answered.  Presentation is a bit odd and could be related to her most recent hip surgery.  I would like to evaluate her for gout rheumatoid arthritis and other arthritides.  I did order lab work for this.  If this is unrevealing I would recommend we get an MRI of the ankle to evaluate for synovitis or impingement.  Otherwise if that is negative can likely assume this is neuritis secondary to her hip surgery that may or may not resolve.  She is currently taking gabapentin I discussed with her she can probably increase her dose to 300 mg nightly or 100 mg 3 times daily.  I will let her know what her lab results are and or the MRI pending these  Return for will call w/ lab results, possible MRI.

## 2021-03-28 ENCOUNTER — Other Ambulatory Visit: Payer: Self-pay | Admitting: General Surgery

## 2021-03-28 DIAGNOSIS — Z86010 Personal history of colonic polyps: Secondary | ICD-10-CM | POA: Diagnosis not present

## 2021-03-28 LAB — ARTHRITIS PANEL
Anti Nuclear Antibody (ANA): NEGATIVE
Rheumatoid fact SerPl-aCnc: <10 IU/mL (ref <14.0)
Sed Rate: 6 mm/hr (ref 0–40)
Uric Acid: 6.4 mg/dL (ref 3.0–7.2)

## 2021-03-29 ENCOUNTER — Other Ambulatory Visit: Payer: Self-pay | Admitting: General Surgery

## 2021-03-29 ENCOUNTER — Ambulatory Visit (INDEPENDENT_AMBULATORY_CARE_PROVIDER_SITE_OTHER): Payer: PPO

## 2021-03-29 VITALS — Ht 61.0 in | Wt 165.0 lb

## 2021-03-29 DIAGNOSIS — Z0000 Encounter for general adult medical examination without abnormal findings: Secondary | ICD-10-CM | POA: Diagnosis not present

## 2021-03-29 NOTE — Progress Notes (Signed)
Subjective:     Patient ID: Lorraine Mills is a 70 y.o. female.   HPI   The following portions of the patient's history were reviewed and updated as appropriate.   This a new patient is here today for: office visit. Here to discuss having a colonoscopy, last in 2017. She was referred by Dr Derrel Nip. She denies any rectal bleeding. Bowels move every other day.   Review of Systems  Constitutional: Negative for chills and fever.  Respiratory: Negative for cough.   Gastrointestinal: Negative for anal bleeding.         Chief Complaint  Patient presents with   Pre-op Exam      BP (!) 158/80   Pulse 69   Temp 37.1 C (98.7 F)   Ht 154.9 cm (5\' 1" )   Wt 75.8 kg (167 lb)   LMP  (LMP Unknown)   SpO2 98%   BMI 31.55 kg/m        Past Medical History:  Diagnosis Date   Anxiety     Chicken pox     Corneal transplant status     Depression 01/31/2016    Last Assessment & Plan:  Now managed with zoloft and alprazolam and trazodone.   The risks and benefits of benzodiazepine use were reviewed with patient today including excessive sedation leading to respiratory depression,  impaired thinking/driving, and addiction.  No changes today   Dyslipidemia (high LDL; low HDL) 12/01/2013    Last Assessment & Plan:  Taking fenofibrate.  Using the Framingham risk calculator,  her 10 year risk of coronary artery disease is 9.7%.will recheck in 6 months after weight loss/   Goiter     Hyperlipidemia     Hypothyroidism     Obesity (BMI 30.0-34.9), unspecified 12/01/2013    Last Assessment & Plan:  I have addressed  BMI and recommended a low glycemic index diet.  I have also recommended that patient start exercising with a goal of 30 minutes of aerobic exercise a minimum of 5 days per week.   Seasonal allergies     Seizure disorder (CMS-HCC) 03/28/2015    Last Assessment & Plan:  Witnessed,  With normla CT head,  Normal EEG ,  No recurrence.    Tubular adenoma of colon 01/31/2016    Last Assessment &  Plan:  found by 011 colonoscopy Vira Agar).  Referral to Dr Bary Castilla for follow up due last year.            Past Surgical History:  Procedure Laterality Date   ARTHROPLASTY HIP TOTAL Right 11/01/2020   BREAST EXCISIONAL BIOPSY Left 1985   CATARACT EXTRACTION Right     COLONOSCOPY   08/17/2009    Adenomatous Polyp: CBF 08/2014; recall ltr mailed 06/16/2014 (dw)   COLONOSCOPY   03/19/2016    Dr Bary Castilla   CORNEAL TRANSPLANT       CORNEAL TRANSPLANT Right     HYSTERECTOMY VAGINAL       OOPHORECTOMY Bilateral 2001   WISDOM TEETH                    OB History     Gravida  2   Para  2   Term      Preterm      AB      Living         SAB      IAB      Ectopic      Molar  Multiple      Live Births           Obstetric Comments  Age at first period 51 Age of first pregnancy 66               Social History           Socioeconomic History   Marital status: Married  Tobacco Use   Smoking status: Former Smoker      Years: 24.00      Types: Cigarettes      Quit date: 05/09/1998      Years since quitting: 22.9   Smokeless tobacco: Never Used  Substance and Sexual Activity   Alcohol use: Yes      Comment: occasionally   Drug use: Never   Sexual activity: Defer            Allergies  Allergen Reactions   Latex Anaphylaxis and Rash   Levaquin [Levofloxacin] Rash      Current Medications        Current Outpatient Medications  Medication Sig Dispense Refill   acetaminophen (TYLENOL) 500 MG tablet Take 1 tablet by mouth every 6 (six) hours as needed       cetirizine (ZYRTEC) 10 MG tablet Take 1 tablet by mouth once daily as needed       clindamycin (CLEOCIN) 300 MG capsule 600 mg by mouth 1 hour prior to dental cleaning 2 capsule 5   fexofenadine-pseudoephedrine (ALLEGRA-D 24H) 180-240 mg ER tablet Take by mouth as needed       fluticasone propionate (FLONASE) 50 mcg/actuation nasal spray by Nasal route as needed       gabapentin (NEURONTIN) 100  MG capsule Take 100 mg by mouth 3 (three) times daily       levothyroxine (SYNTHROID) 75 MCG tablet Take 1 tablet by mouth every morning before breakfast (0630)       omeprazole (PRILOSEC) 20 MG DR capsule 20 mg once daily as needed          rosuvastatin (CRESTOR) 10 MG tablet 10 mg once daily          traZODone (DESYREL) 50 MG tablet Take 50 mg by mouth at bedtime   0    No current facility-administered medications for this visit.             Family History  Problem Relation Age of Onset   Cancer Mother     Ovarian cancer Mother     Stroke Father     Diabetes Sister     Cancer Sister     Diabetes type II Sister     Heart disease Sister     Breast cancer Sister 64   Atrial fibrillation (Abnormal heart rhythm sometimes requiring treatment with blood thinners) Brother     Thyroid disease Other          Labs and Radiology:      Colonoscopy August 17, 2009: Tubular adenoma of the descending colon.  Hyperplastic polyp of the sigmoid colon.   2017 colonoscopy: Negative.     March 20, 2021 laboratory:   WBC 4.0 - 10.5 K/uL 5.2   RBC 3.87 - 5.11 Mil/uL 4.55   Hemoglobin 12.0 - 15.0 g/dL 14.0   HCT 36.0 - 46.0 % 42.2   MCV 78.0 - 100.0 fl 92.7   MCHC 30.0 - 36.0 g/dL 33.2   RDW 11.5 - 15.5 % 13.8   Platelets 150.0 - 400.0 K/uL 166.0   Neutrophils Relative %  43.0 - 77.0 % 68.1   Lymphocytes Relative 12.0 - 46.0 % 20.6   Monocytes Relative 3.0 - 12.0 % 9.3   Eosinophils Relative 0.0 - 5.0 % 1.4   Basophils Relative 0.0 - 3.0 % 0.6   Neutro Abs 1.4 - 7.7 K/uL 3.6   Lymphs Abs 0.7 - 4.0 K/uL 1.1   Monocytes Absolute 0.1 - 1.0 K/uL 0.5     Sodium 135 - 145 mEq/L 140   Potassium 3.5 - 5.1 mEq/L 4.3   Chloride 96 - 112 mEq/L 105   CO2 19 - 32 mEq/L 29   Glucose, Bld 70 - 99 mg/dL 90   BUN 6 - 23 mg/dL 16   Creatinine, Ser 0.40 - 1.20 mg/dL 0.76   Total Bilirubin 0.2 - 1.2 mg/dL 0.4   Alkaline Phosphatase 39 - 117 U/L 59   AST 0 - 37 U/L 20   ALT 0 - 35 U/L 17   Total  Protein 6.0 - 8.3 g/dL 6.6   Albumin 3.5 - 5.2 g/dL 4.2   GFR >60.00 mL/min 79.67   Comment: Calculated using the CKD-EPI Creatinine Equation (2021)  Calcium 8.4 - 10.5 mg/dL 9.4              Objective:   Physical Exam Constitutional:      Appearance: Normal appearance.  Cardiovascular:     Rate and Rhythm: Normal rate and regular rhythm.     Pulses: Normal pulses.     Heart sounds: Normal heart sounds.  Pulmonary:     Effort: Pulmonary effort is normal.     Breath sounds: Normal breath sounds.  Musculoskeletal:     Cervical back: Neck supple.  Skin:    General: Skin is warm and dry.  Neurological:     Mental Status: She is alert and oriented to person, place, and time.  Psychiatric:        Mood and Affect: Mood normal.        Behavior: Behavior normal.           Assessment:     Candidate for repeat colonoscopy.    Plan:     With the patient's recent hip surgery, she will be asked to make use of amoxicillin for-500 mg capsules 1 hour prior to the procedure.   Based on her every other day bowel habits will make use of Dulcolax preprocedure day 2 and MiraLAX preprocedure day 1.  She was instructed in regards to prep by the nurse.      This note is partially prepared by Karie Fetch, RN, acting as a scribe in the presence of Dr. Hervey Ard, MD.  The documentation recorded by the scribe accurately reflects the service I personally performed and the decisions made by me.    Robert Bellow, MD FACS

## 2021-03-29 NOTE — Progress Notes (Addendum)
Subjective:   Lorraine Mills is a 70 y.o. female who presents for Medicare Annual (Subsequent) preventive examination.  Review of Systems    No ROS.  Medicare Wellness Virtual Visit.  Visual/audio telehealth visit, UTA vital signs.   See social history for additional risk factors.   Cardiac Risk Factors include: advanced age (>76men, >7 women)     Objective:    Today's Vitals   03/29/21 0951  Weight: 165 lb (74.8 kg)  Height: 5\' 1"  (1.549 m)   Body mass index is 31.18 kg/m.  Advanced Directives 03/29/2021 11/01/2020 10/24/2020 03/28/2020 03/28/2019 02/05/2018 03/25/2016  Does Patient Have a Medical Advance Directive? No No No No Yes No No  Does patient want to make changes to medical advance directive? - - - - No - Patient declined Yes (MAU/Ambulatory/Procedural Areas - Information given) -  Would patient like information on creating a medical advance directive? No - Patient declined No - Patient declined Yes (MAU/Ambulatory/Procedural Areas - Information given) No - Patient declined - - Yes - Educational materials given    Current Medications (verified) Outpatient Encounter Medications as of 03/29/2021  Medication Sig   acetaminophen (TYLENOL) 500 MG tablet Take 500 mg by mouth every 6 (six) hours as needed.   cetirizine (ZYRTEC) 10 MG tablet Take 10 mg by mouth daily as needed for allergies.   fluticasone (FLONASE) 50 MCG/ACT nasal spray Place 1-2 sprays into both nostrils daily as needed for allergies or rhinitis.   gabapentin (NEURONTIN) 100 MG capsule Take 1 capsule (100 mg total) by mouth 3 (three) times daily.   levothyroxine (SYNTHROID) 75 MCG tablet TAKE 1 TABLET EVERY DAY ON EMPTY STOMACHWITH A GLASS OF WATER AT LEAST 30-60 MINBEFORE BREAKFAST   omeprazole (PRILOSEC) 20 MG capsule TAKE 1 CAPSULE BY MOUTH DAILY   prednisoLONE acetate (PRED FORTE) 1 % ophthalmic suspension Place 1 drop into the right eye daily.   predniSONE (DELTASONE) 10 MG tablet 6 tablets on Day 1 ,  then reduce by 1 tablet daily until gone   rosuvastatin (CRESTOR) 10 MG tablet TAKE ONE TABLET EVERY DAY.   traZODone (DESYREL) 50 MG tablet TAKE ONE TABLET AT BEDTIME AS NEEDED FORSLEEP.   No facility-administered encounter medications on file as of 03/29/2021.    Allergies (verified) Latex and Levofloxacin   History: Past Medical History:  Diagnosis Date   Anxiety    Arthritis    Depression 01/31/2016   Goiter    Hyperlipidemia    TGs   Hypothyroidism    Seasonal allergies    Past Surgical History:  Procedure Laterality Date   ABDOMINAL HYSTERECTOMY     supracervical    BILATERAL OOPHORECTOMY  2001   BENIGN TUMOR   BREAST BIOPSY Left 1985   cyst    CATARACT EXTRACTION Right    COLONOSCOPY  08/17/2009   COLONOSCOPY WITH PROPOFOL N/A 03/19/2016   Procedure: COLONOSCOPY WITH PROPOFOL;  Surgeon: Robert Bellow, MD;  Location: [facility] ENDOSCOPY;  Service: Endoscopy;  Laterality: N/A;   CORNEAL TRANSPLANT Right    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   Procedure: TOTAL HIP ARTHROPLASTY ANTERIOR APPROACH;  Surgeon: Hessie Knows, MD;  Location: [facility] ORS;  Service: Orthopedics;  Laterality: Right;   Family History  Problem Relation Age of Onset   Cancer Mother        OVARIAN   Stroke Father    Breast cancer Sister 73   Thyroid disease Other        MULTIPLE FAMILY MEMBERS  Atrial fibrillation Brother    Social History   Socioeconomic History   Marital status: Married    Spouse name: Not on file   Number of children: Not on file   Years of education: Not on file   Highest education level: Not on file  Occupational History   Not on file  Tobacco Use   Smoking status: Former    Years: 24.00    Types: Cigarettes    Quit date: 05/09/1998    Years since quitting: 22.9   Smokeless tobacco: Never  Vaping Use   Vaping Use: Never used  Substance and Sexual Activity   Alcohol use: Yes    Alcohol/week: 0.0 standard drinks    Comment: rarely    Drug use: No    Sexual activity: Not on file  Other Topics Concern   Not on file  Social History Narrative   Not on file   Social Determinants of Health   Financial Resource Strain: Low Risk    Difficulty of Paying Living Expenses: Not hard at all  Food Insecurity: No Food Insecurity   Worried About Charity fundraiser in the Last Year: Never true   [place] in the Last Year: Never true  Transportation Needs: No Transportation Needs   Lack of Transportation (Medical): No   Lack of Transportation (Non-Medical): No  Physical Activity: Insufficiently Active   Days of Exercise per Week: 3 days   Minutes of Exercise per Session: 30 min  Stress: No Stress Concern Present   Feeling of Stress : Not at all  Social Connections: Unknown   Frequency of Communication with Friends and Family: Not on file   Frequency of Social Gatherings with Friends and Family: Not on file   Attends Religious Services: Not on Electrical engineer or Organizations: Not on file   Attends Archivist Meetings: Not on file   Marital Status: Married    Tobacco Counseling Counseling given: Not Answered   Clinical Intake:  Pre-visit preparation completed: Yes        Diabetes: No  How often do you need to have someone help you when you read instructions, pamphlets, or other written materials from your doctor or pharmacy?: 1 - Never  Interpreter Needed?: No      Activities of Daily Living In your present state of health, do you have any difficulty performing the following activities: 03/29/2021 11/01/2020  Hearing? N N  Vision? N Y  Comment - -  Difficulty concentrating or making decisions? N N  Comment - -  Walking or climbing stairs? N N  Comment - -  Dressing or bathing? N N  Doing errands, shopping? N N  Preparing Food and eating ? N -  Using the Toilet? N -  In the past six months, have you accidently leaked urine? N -  Do you have problems with loss of bowel control? N -   Managing your Medications? N -  Managing your Finances? N -  Housekeeping or managing your Housekeeping? N -  Some recent data might be hidden    Patient Care Team: [name], MD as PCP - General (Internal Medicine) [name], MD (Internal Medicine) Bary Castilla Forest Gleason, MD (General Surgery)  Indicate any recent Medical Services you may have received from other than [facility] providers in the past year (date may be approximate).     Assessment:   This is a routine wellness examination for Shawnese.  I connected with Charlynn today by telephone and verified that I am speaking with the correct person using two identifiers. Location patient: home Location provider: work Persons participating in the virtual visit: patient, Marine scientist.    I discussed the limitations, risks, security and privacy concerns of performing an evaluation and management service by telephone and the availability of in person appointments. The patient expressed understanding and verbally consented to this telephonic visit.    Interactive audio and video telecommunications were attempted between this provider and patient, however failed, due to patient having technical difficulties OR patient did not have access to video capability.  We continued and completed visit with audio only.  Some vital signs may be absent or patient reported.   Hearing/Vision screen Hearing Screening - Comments:: Patient is able to hear conversational tones without difficulty.  No issues reported. Vision Screening - Comments:: Wears corrective lenses Cataract extraction, bilateral They have seen their ophthalmologist in the last 12 months.   Dietary issues and exercise activities discussed: Current Exercise Habits: The patient does not participate in regular exercise at present Regular diet   Goals Addressed             This Visit's Progress    Healthy diet       Stay hydrated Low carb diet       Depression Screen PHQ 2/9  Scores 03/29/2021 03/20/2021 10/31/2020 06/29/2020 03/28/2020 02/02/2020 03/28/2019  PHQ - 2 Score 0 0 0 0 0 0 0  PHQ- 9 Score 0 0 1 0 0 0 -    Fall Risk Fall Risk  03/29/2021 03/20/2021 10/31/2020 10/02/2020 07/04/2020  Falls in the past year? 0 0 0 0 0  Number falls in past yr: - - - 0 -  Injury with Fall? - - - 0 -  Follow up Falls evaluation completed Falls evaluation completed Falls evaluation completed Falls evaluation completed Falls evaluation completed    [place]: Adequate lighting in your home to reduce risk of falls? Yes   ASSISTIVE DEVICES UTILIZED TO PREVENT FALLS: Life alert? No  Use of a cane, walker or w/c? No  Grab bars in the bathroom? No  Shower chair or bench in shower? No  Elevated toilet seat or a handicapped toilet? Yes   TIMED UP AND GO: Was the test performed? No . Virtual visit.   Cognitive Function: Patient is alert and oriented x3.  MMSE - Mini Mental State Exam 02/05/2018  Orientation to time 5  Orientation to Place 5  Registration 3  Attention/ Calculation 5  Recall 3  Language- name 2 objects 2  Language- repeat 1  Language- follow 3 step command 3  Language- read & follow direction 1  Write a sentence 1  Copy design 1  Total score 30     6CIT Screen 03/29/2021 03/28/2019  What Year? 0 points 0 points  What month? 0 points 0 points  What time? 0 points 0 points  Count back from 20 0 points 0 points  Months in reverse 0 points 0 points  Repeat phrase 4 points -  Total Score 4 -    Immunizations Immunization History  Administered Date(s) Administered   Fluad Quad(high Dose 65+) 03/20/2021   Influenza, High Dose Seasonal PF 03/27/2015   Influenza-Unspecified 04/26/2019, 03/30/2020   Moderna Sars-Covid-2 Vaccination 07/21/2019, 08/18/2019   Pneumococcal Conjugate-13 09/24/2017   Pneumococcal Polysaccharide-23 04/17/2020   Tdap 10/07/2012   Zoster Recombinat (Shingrix) 02/24/2020, 05/24/2020    Screening  Tests Health Maintenance  Topic Date Due   COVID-19 Vaccine (3 - Moderna risk series) 04/05/2021 (Originally 09/15/2019)   COLONOSCOPY (Pts 45-30yrs Insurance coverage will need to be confirmed)  05/01/2021 (Originally 03/19/2021)   MAMMOGRAM  04/18/2021   TETANUS/TDAP  10/08/2022   Pneumonia Vaccine 16+ Years old  Completed   INFLUENZA VACCINE  Completed   DEXA SCAN  Completed   Hepatitis C Screening  Completed   Zoster Vaccines- Shingrix  Completed   HPV VACCINES  Aged Out   Health Maintenance There are no preventive care reminders to display for this patient.  Colonoscopy- scheduled 05/01/21.  Mammogram- deferred until next office visit with pcp 04/18/21 per patient.   Lung Cancer Screening: (Low Dose CT Chest recommended if Age 65-80 years, 30 pack-year currently smoking OR have quit w/in 15years.) does not qualify.   Vision Screening: Recommended annual ophthalmology exams for early detection of glaucoma and other disorders of the eye.  Dental Screening: Recommended annual dental exams for proper oral hygiene  Community Resource Referral / Chronic Care Management: CRR required this visit?  No   CCM required this visit?  No      Plan:   Keep all routine maintenance appointments.   I have personally reviewed and noted the following in the patient's chart:   Medical and social history Use of alcohol, tobacco or illicit drugs  Current medications and supplements including opioid prescriptions. Not taking opioid.  Functional ability and status Nutritional status Physical activity Advanced directives List of other physicians Hospitalizations, surgeries, and ER visits in previous 12 months Vitals Screenings to include cognitive, depression, and falls Referrals and appointments  In addition, I have reviewed and discussed with patient certain preventive protocols, quality metrics, and best practice recommendations. A written personalized care plan for  preventive services as well as general preventive health recommendations were provided to patient.     OBrien-Blaney, Brigette Hopfer L, LPN   [birth year]        I have reviewed the above information and agree with above.   Deborra Medina, MD

## 2021-03-29 NOTE — Patient Instructions (Addendum)
Ms. Lorraine Mills , Thank you for taking time to come for your Medicare Wellness Visit. I appreciate your ongoing commitment to your health goals. Please review the following plan we discussed and let me know if I can assist you in the future.   These are the goals we discussed:  Goals      Healthy diet     Stay hydrated Low carb diet        This is a list of the screening recommended for you and due dates:  Health Maintenance  Topic Date Due   COVID-19 Vaccine (3 - Moderna risk series) 04/05/2021*   Colon Cancer Screening  05/01/2021*   Mammogram  04/18/2021   Tetanus Vaccine  10/08/2022   Pneumonia Vaccine  Completed   Flu Shot  Completed   DEXA scan (bone density measurement)  Completed   Hepatitis C Screening: USPSTF Recommendation to screen - Ages 18-79 yo.  Completed   Zoster (Shingles) Vaccine  Completed   HPV Vaccine  Aged Out  *Topic was postponed. The date shown is not the original due date.    Advanced directives: not yet completed  Conditions/risks identified: none new  Follow up in one year for your annual wellness visit.   Preventive Care 67 Years and Older, Female Preventive care refers to lifestyle choices and visits with your health care provider that can promote health and wellness. What does preventive care include? A yearly physical exam. This is also called an annual well check. Dental exams once or twice a year. Routine eye exams. Ask your health care provider how often you should have your eyes checked. Personal lifestyle choices, including: Daily care of your teeth and gums. Regular physical activity. Eating a healthy diet. Avoiding tobacco and drug use. Limiting alcohol use. Practicing safe sex. Taking low doses of aspirin every day. Taking vitamin and mineral supplements as recommended by your health care provider. What happens during an annual well check? The services and screenings done by your health care provider during your annual well check  will depend on your age, overall health, lifestyle risk factors, and family history of disease. Counseling  Your health care provider may ask you questions about your: Alcohol use. Tobacco use. Drug use. Emotional well-being. Home and relationship well-being. Sexual activity. Eating habits. History of falls. Memory and ability to understand (cognition). Work and work Statistician. Screening  You may have the following tests or measurements: Height, weight, and BMI. Blood pressure. Lipid and cholesterol levels. These may be checked every 5 years, or more frequently if you are over 50 years old. Skin check. Lung cancer screening. You may have this screening every year starting at age 38 if you have a 30-pack-year history of smoking and currently smoke or have quit within the past 15 years. Fecal occult blood test (FOBT) of the stool. You may have this test every year starting at age 21. Flexible sigmoidoscopy or colonoscopy. You may have a sigmoidoscopy every 5 years or a colonoscopy every 10 years starting at age 110. Prostate cancer screening. Recommendations will vary depending on your family history and other risks. Hepatitis C blood test. Hepatitis B blood test. Sexually transmitted disease (STD) testing. Diabetes screening. This is done by checking your blood sugar (glucose) after you have not eaten for a while (fasting). You may have this done every 1-3 years. Abdominal aortic aneurysm (AAA) screening. You may need this if you are a current or former smoker. Osteoporosis. You may be screened starting at age 15  if you are at high risk. Talk with your health care provider about your test results, treatment options, and if necessary, the need for more tests. Vaccines  Your health care provider may recommend certain vaccines, such as: Influenza vaccine. This is recommended every year. Tetanus, diphtheria, and acellular pertussis (Tdap, Td) vaccine. You may need a Td booster every 10  years. Zoster vaccine. You may need this after age 69. Pneumococcal 13-valent conjugate (PCV13) vaccine. One dose is recommended after age 20. Pneumococcal polysaccharide (PPSV23) vaccine. One dose is recommended after age 62. Talk to your health care provider about which screenings and vaccines you need and how often you need them. This information is not intended to replace advice given to you by your health care provider. Make sure you discuss any questions you have with your health care provider. Document Released: 06/22/2015 Document Revised: 02/13/2016 Document Reviewed: 03/27/2015 Elsevier Interactive Patient Education  2017 [place] Prevention in the Home Falls can cause injuries. They can happen to people of all ages. There are many things you can do to make your home safe and to help prevent falls. What can I do on the outside of my home? Regularly fix the edges of walkways and driveways and fix any cracks. Remove anything that might make you trip as you walk through a door, such as a raised step or threshold. Trim any bushes or trees on the path to your home. Use bright outdoor lighting. Clear any walking paths of anything that might make someone trip, such as rocks or tools. Regularly check to see if handrails are loose or broken. Make sure that both sides of any steps have handrails. Any raised decks and porches should have guardrails on the edges. Have any leaves, snow, or ice cleared regularly. Use sand or salt on walking paths during winter. Clean up any spills in your garage right away. This includes oil or grease spills. What can I do in the bathroom? Use night lights. Install grab bars by the toilet and in the tub and shower. Do not use towel bars as grab bars. Use non-skid mats or decals in the tub or shower. If you need to sit down in the shower, use a plastic, non-slip stool. Keep the floor dry. Clean up any water that spills on the floor as soon as it  happens. Remove soap buildup in the tub or shower regularly. Attach bath mats securely with double-sided non-slip rug tape. Do not have throw rugs and other things on the floor that can make you trip. What can I do in the bedroom? Use night lights. Make sure that you have a light by your bed that is easy to reach. Do not use any sheets or blankets that are too big for your bed. They should not hang down onto the floor. Have a firm chair that has side arms. You can use this for support while you get dressed. Do not have throw rugs and other things on the floor that can make you trip. What can I do in the kitchen? Clean up any spills right away. Avoid walking on wet floors. Keep items that you use a lot in easy-to-reach places. If you need to reach something above you, use a strong step stool that has a grab bar. Keep electrical cords out of the way. Do not use floor polish or wax that makes floors slippery. If you must use wax, use non-skid floor wax. Do not have throw rugs and other things  on the floor that can make you trip. What can I do with my stairs? Do not leave any items on the stairs. Make sure that there are handrails on both sides of the stairs and use them. Fix handrails that are broken or loose. Make sure that handrails are as long as the stairways. Check any carpeting to make sure that it is firmly attached to the stairs. Fix any carpet that is loose or worn. Avoid having throw rugs at the top or bottom of the stairs. If you do have throw rugs, attach them to the floor with carpet tape. Make sure that you have a light switch at the top of the stairs and the bottom of the stairs. If you do not have them, ask someone to add them for you. What else can I do to help prevent falls? Wear shoes that: Do not have high heels. Have rubber bottoms. Are comfortable and fit you well. Are closed at the toe. Do not wear sandals. If you use a stepladder: Make sure that it is fully opened.  Do not climb a closed stepladder. Make sure that both sides of the stepladder are locked into place. Ask someone to hold it for you, if possible. Clearly mark and make sure that you can see: Any grab bars or handrails. First and last steps. Where the edge of each step is. Use tools that help you move around (mobility aids) if they are needed. These include: Canes. Walkers. Scooters. Crutches. Turn on the lights when you go into a dark area. Replace any light bulbs as soon as they burn out. Set up your furniture so you have a clear path. Avoid moving your furniture around. If any of your floors are uneven, fix them. If there are any pets around you, be aware of where they are. Review your medicines with your doctor. Some medicines can make you feel dizzy. This can increase your chance of falling. Ask your doctor what other things that you can do to help prevent falls. This information is not intended to replace advice given to you by your health care provider. Make sure you discuss any questions you have with your health care provider. Document Released: 03/22/2009 Document Revised: 11/01/2015 Document Reviewed: 06/30/2014 Elsevier Interactive Patient Education  2017 [employer].

## 2021-04-29 ENCOUNTER — Encounter: Payer: Self-pay | Admitting: Internal Medicine

## 2021-05-08 ENCOUNTER — Other Ambulatory Visit: Payer: Self-pay

## 2021-05-08 ENCOUNTER — Ambulatory Visit (INDEPENDENT_AMBULATORY_CARE_PROVIDER_SITE_OTHER): Payer: PPO | Admitting: Internal Medicine

## 2021-05-08 ENCOUNTER — Encounter: Payer: Self-pay | Admitting: Internal Medicine

## 2021-05-08 VITALS — BP 120/78 | HR 80 | Temp 96.2°F | Ht 61.0 in | Wt 169.8 lb

## 2021-05-08 DIAGNOSIS — R202 Paresthesia of skin: Secondary | ICD-10-CM

## 2021-05-08 DIAGNOSIS — E785 Hyperlipidemia, unspecified: Secondary | ICD-10-CM | POA: Diagnosis not present

## 2021-05-08 DIAGNOSIS — E669 Obesity, unspecified: Secondary | ICD-10-CM

## 2021-05-08 DIAGNOSIS — E039 Hypothyroidism, unspecified: Secondary | ICD-10-CM

## 2021-05-08 DIAGNOSIS — N6321 Unspecified lump in the left breast, upper outer quadrant: Secondary | ICD-10-CM | POA: Insufficient documentation

## 2021-05-08 DIAGNOSIS — Z1231 Encounter for screening mammogram for malignant neoplasm of breast: Secondary | ICD-10-CM

## 2021-05-08 DIAGNOSIS — R200 Anesthesia of skin: Secondary | ICD-10-CM | POA: Diagnosis not present

## 2021-05-08 DIAGNOSIS — E663 Overweight: Secondary | ICD-10-CM

## 2021-05-08 NOTE — Assessment & Plan Note (Signed)
Diagnostic mammogram ordered for mass appreciated on left breast LUO quadrant

## 2021-05-08 NOTE — Assessment & Plan Note (Signed)
I have addressed  BMI and recommended wt loss of 10% of body weigh over the next 6 months using a low glycemic index diet and regular exercise a minimum of 5 days per week.   

## 2021-05-08 NOTE — Progress Notes (Signed)
Patient ID: Lorraine Mills, female    DOB: 1950-06-17  Age: 70 y.o. MRN: [id number]  The patient is here for follow up and management of other chronic and acute problems.  This visit occurred during the SARS-CoV-2 public health emergency.  Safety protocols were in place, including screening questions prior to the visit, additional usage of staff PPE, and extensive cleaning of exam room while observing appropriate contact time as indicated for disinfecting solutions.     The risk factors are reflected in the social history.  The roster of all physicians providing medical care to patient - is listed in the Snapshot section of the chart.  Activities of daily living:  The patient is 100% independent in all ADLs: dressing, toileting, feeding as well as independent mobility  Home safety : The patient has smoke detectors in the home. They wear seatbelts.  There are no firearms at home. There is no violence in the home.   There is no risks for hepatitis, STDs or HIV. There is no   history of blood transfusion. They have no travel history to infectious disease endemic areas of the world.  The patient has seen their dentist in the last six month. They have seen their eye doctor in the last year. They admit to slight hearing difficulty with regard to whispered voices and some television programs.  They have deferred audiologic testing in the last year.  They do not  have excessive sun exposure. Discussed the need for sun protection: hats, long sleeves and use of sunscreen if there is significant sun exposure.   Diet: the importance of a healthy diet is discussed. They do have a healthy diet.  The benefits of regular aerobic exercise were discussed. She walks 4 times per week ,  20 minutes.   Depression screen: there are no signs or vegative symptoms of depression- irritability, change in appetite, anhedonia, sadness/tearfullness.  Cognitive assessment: the patient manages all their financial and personal  affairs and is actively engaged. They could relate day,date,year and events; recalled 2/3 objects at 3 minutes; performed clock-face test normally.  The following portions of the patient's history were reviewed and updated as appropriate: allergies, current medications, past family history, past medical history,  past surgical history, past social history  and problem list.  Visual acuity was not assessed per patient preference since she has regular follow up with her ophthalmologist. Hearing and body mass index were assessed and reviewed.   During the course of the visit the patient was educated and counseled about appropriate screening and preventive services including : fall prevention , diabetes screening, nutrition counseling, colorectal cancer screening, and recommended immunizations.    CC: The primary encounter diagnosis was Encounter for screening mammogram for malignant neoplasm of breast. Diagnoses of Mass of upper outer quadrant of left breast, Numbness and tingling of right lower extremity, Acquired hypothyroidism, Overweight (BMI 25.0-29.9), Obesity (BMI 30.0-34.9), and Dyslipidemia (high LDL; low HDL) were also pertinent to this visit.  1) history of right ankle pain aggravated by weight bearing and lateral calf tingling , started 5 weeks after hip replacement  with no interim injury.  Saw podiatry who did x rays and labs.  Inflammation noted. Symptoms are improved but not resolved.  Notes that right leg is now longer than the left . Residual pain  is mild and occurs with  use of ankle   History Lorraine Mills has a past medical history of Anxiety, Arthritis, Depression (01/31/2016), Goiter, Hyperlipidemia, Hypothyroidism, and Seasonal allergies.  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   Her family history includes Atrial fibrillation in her brother; Breast cancer (age of onset: 81) in her sister; Cancer in her mother; Stroke in her father; Thyroid disease in an other family member.She reports that she quit smoking about 23 years ago. Her smoking use included cigarettes. She has never used smokeless tobacco. She reports current alcohol use. She reports that she does not use drugs.  Outpatient Medications Prior to Visit  Medication Sig Dispense Refill   acetaminophen (TYLENOL) 500 MG tablet Take 500 mg by mouth every 6 (six) hours as needed.     cetirizine (ZYRTEC) 10 MG tablet Take 10 mg by mouth daily as needed for allergies.     fluticasone (FLONASE) 50 MCG/ACT nasal spray Place 1-2 sprays into both nostrils daily as needed for allergies or rhinitis.     gabapentin (NEURONTIN) 100 MG capsule Take 1 capsule (100 mg total) by mouth 3 (three) times daily. 90 capsule 3   levothyroxine (SYNTHROID) 75 MCG tablet TAKE 1 TABLET EVERY DAY ON EMPTY STOMACHWITH A GLASS OF WATER AT LEAST 30-60 MINBEFORE BREAKFAST 90 tablet 1   omeprazole (PRILOSEC) 20 MG capsule TAKE 1 CAPSULE BY MOUTH DAILY 30 capsule 3   prednisoLONE acetate (PRED FORTE) 1 % ophthalmic suspension Place 1 drop into the right eye daily.     rosuvastatin (CRESTOR) 10 MG tablet TAKE ONE TABLET EVERY DAY. 30 tablet 5   traZODone (DESYREL) 50 MG tablet TAKE ONE TABLET AT BEDTIME AS NEEDED FORSLEEP. 90 tablet 1   predniSONE (DELTASONE) 10 MG tablet 6 tablets on Day 1 , then reduce by 1 tablet daily until gone 21 tablet 0   No facility-administered medications prior to visit.    Review of Systems  Objective:  BP 120/78 (BP Location: Left Arm, Patient Position: Sitting, Cuff Size: Normal)   Pulse 80   Temp (!) 96.2 F (35.7 C) (Temporal)   Ht 5\' 1"  (1.549 m)   Wt 169 lb 12.8 oz (77 kg)   SpO2 97%   BMI 32.08 kg/m   Physical Exam  Physical Exam    Assessment & Plan:   Problem List Items Addressed This Visit     Hypothyroidism    Thyroid function has been WNL on current dose of 75 mcg daily .  No current changes needed.    Lab Results  Component Value Date   TSH 1.78 03/20/2021         Dyslipidemia (high LDL; low HDL)    LDL is at goal with crestor,  But Triglycerides are elevated today to over 300.  Recommended a low glycemic index diet, weight  loss and regular exercise.  Advised t o  repeat in 6 months.   Lab Results  Component Value Date   CHOL 162 03/20/2021   HDL 42.30 03/20/2021   LDLCALC 60 02/02/2020   LDLDIRECT 59.0 03/20/2021   TRIG 363.0 (H) 03/20/2021   CHOLHDL 4 03/20/2021         Overweight (BMI 25.0-29.9)    She had achieved a weight of of 35 lbs,  Or 20% of her starting weight using the [place] but has gained 20 lbs since January, and is not exercising.  counselling given       Numbness and tingling of right lower  extremity    Improving ,  Associated with swelling of right ankle post hip replacement.      Obesity (BMI 30.0-34.9)    I have addressed  BMI and recommended wt loss of 10% of body weigh over the next 6 months using a low glycemic index diet and regular exercise a minimum of 5 days per week.        Mass of upper outer quadrant of left breast    Diagnostic mammogram ordered for mass appreciated on left breast LUO quadrant      Relevant Orders   MM DIAG BREAST TOMO BILATERAL   US BREAST LTD UNI LEFT INC AXILLA   Other Visit Diagnoses     Encounter for screening mammogram for malignant neoplasm of breast    -  Primary       I have discontinued Lorraine Mills's predniSONE. I am also having her maintain her prednisoLONE acetate, fluticasone, cetirizine, acetaminophen, omeprazole, traZODone, levothyroxine, rosuvastatin, and gabapentin.  No orders of the defined types were placed in this encounter.   Medications Discontinued During This Encounter  Medication Reason    predniSONE (DELTASONE) 10 MG tablet     Follow-up: No follow-ups on file.   [name], MD

## 2021-05-08 NOTE — Assessment & Plan Note (Signed)
Thyroid function has been WNL on current dose of 75 mcg daily .  No current changes needed.    Lab Results  Component Value Date   TSH 1.78 03/20/2021

## 2021-05-08 NOTE — Assessment & Plan Note (Signed)
LDL is at goal with crestor,  But Triglycerides are elevated today to over 300.  Recommended a low glycemic index diet, weight  loss and regular exercise.  Advised t o  repeat in 6 months.   Lab Results  Component Value Date   CHOL 162 03/20/2021   HDL 42.30 03/20/2021   LDLCALC 60 02/02/2020   LDLDIRECT 59.0 03/20/2021   TRIG 363.0 (H) 03/20/2021   CHOLHDL 4 03/20/2021

## 2021-05-08 NOTE — Assessment & Plan Note (Signed)
Improving ,  Associated with swelling of right ankle post hip replacement.

## 2021-05-08 NOTE — Assessment & Plan Note (Signed)
She had achieved a weight of of 35 lbs,  Or 20% of her starting weight using the [place] but has gained 20 lbs since January, and is not exercising.  counselling given

## 2021-05-08 NOTE — Patient Instructions (Signed)
Merry Christmas!    We should repeat fasting labs in April   Diagnostic mammogram of left breast has been ordered

## 2021-05-14 ENCOUNTER — Encounter: Payer: Self-pay | Admitting: General Surgery

## 2021-05-15 ENCOUNTER — Ambulatory Visit
Admission: RE | Admit: 2021-05-15 | Discharge: 2021-05-15 | Disposition: A | Discharge status: Hospice | Payer: PPO | Source: Ambulatory Visit | Attending: General Surgery | Admitting: General Surgery

## 2021-05-15 ENCOUNTER — Ambulatory Visit: Payer: PPO | Admitting: Anesthesiology

## 2021-05-15 ENCOUNTER — Encounter: Payer: Self-pay | Admitting: General Surgery

## 2021-05-15 ENCOUNTER — Encounter
Admission: RE | Disposition: A | Discharge status: Hospice | Payer: Self-pay | Source: Ambulatory Visit | Attending: General Surgery

## 2021-05-15 DIAGNOSIS — Z86010 Personal history of colonic polyps: Secondary | ICD-10-CM | POA: Diagnosis not present

## 2021-05-15 DIAGNOSIS — R569 Unspecified convulsions: Secondary | ICD-10-CM | POA: Insufficient documentation

## 2021-05-15 DIAGNOSIS — K621 Rectal polyp: Secondary | ICD-10-CM | POA: Diagnosis not present

## 2021-05-15 DIAGNOSIS — Q438 Other specified congenital malformations of intestine: Secondary | ICD-10-CM | POA: Insufficient documentation

## 2021-05-15 DIAGNOSIS — E039 Hypothyroidism, unspecified: Secondary | ICD-10-CM | POA: Insufficient documentation

## 2021-05-15 DIAGNOSIS — F419 Anxiety disorder, unspecified: Secondary | ICD-10-CM | POA: Diagnosis not present

## 2021-05-15 DIAGNOSIS — R519 Headache, unspecified: Secondary | ICD-10-CM | POA: Diagnosis not present

## 2021-05-15 DIAGNOSIS — F32A Depression, unspecified: Secondary | ICD-10-CM | POA: Diagnosis not present

## 2021-05-15 DIAGNOSIS — Z1211 Encounter for screening for malignant neoplasm of colon: Secondary | ICD-10-CM | POA: Diagnosis not present

## 2021-05-15 HISTORY — PX: COLONOSCOPY WITH PROPOFOL: SHX5780

## 2021-05-15 SURGERY — COLONOSCOPY WITH PROPOFOL
Anesthesia: General

## 2021-05-15 MED ORDER — LIDOCAINE HCL (PF) 2 % IJ SOLN
INTRAMUSCULAR | Status: AC
  Filled 2021-05-15: qty 5

## 2021-05-15 MED ORDER — PROPOFOL 500 MG/50ML IV EMUL
INTRAVENOUS | Status: AC
  Filled 2021-05-15: qty 100

## 2021-05-15 MED ORDER — PROPOFOL 10 MG/ML IV BOLUS
INTRAVENOUS | Status: DC | PRN
  Administered 2021-05-15 (×2): 30 mg via INTRAVENOUS
  Administered 2021-05-15: 70 mg via INTRAVENOUS

## 2021-05-15 MED ORDER — GLYCOPYRROLATE 0.2 MG/ML IJ SOLN
INTRAMUSCULAR | Status: AC
  Filled 2021-05-15: qty 1

## 2021-05-15 MED ORDER — SODIUM CHLORIDE 0.9 % IV SOLN: INTRAVENOUS | Status: DC

## 2021-05-15 MED ORDER — PROPOFOL 500 MG/50ML IV EMUL
INTRAVENOUS | Status: DC | PRN
  Administered 2021-05-15: 120 ug/kg/min via INTRAVENOUS

## 2021-05-15 MED ORDER — LIDOCAINE 2% (20 MG/ML) 5 ML SYRINGE
INTRAMUSCULAR | Status: DC | PRN
  Administered 2021-05-15: 20 mg via INTRAVENOUS

## 2021-05-15 NOTE — Op Note (Signed)
[facility] Patient Name: Lorraine Mills Procedure Date: 05/15/2021 7:46 AM MRN: [id number] Account #: [id number] Date of Birth: 23-Aug-1950 Admit Type: Outpatient Age: 70 Room: [facility] ENDO ROOM 1 Gender: Female Note Status: Finalized Instrument Name: Peds Colonoscope [id number] Procedure:             Colonoscopy Indications:           High risk colon cancer surveillance: Personal history                         of colonic polyps Providers:             Robert Bellow, MD Medicines:             Propofol per Anesthesia Complications:         No immediate complications. Procedure:             Pre-Anesthesia Assessment:                        - Prior to the procedure, a History and Physical was                         performed, and patient medications, allergies and                         sensitivities were reviewed. The patient's tolerance                         of previous anesthesia was reviewed.                        - The risks and benefits of the procedure and the                         sedation options and risks were discussed with the                         patient. All questions were answered and informed                         consent was obtained.                        After obtaining informed consent, the colonoscope was                         passed under direct vision. Throughout the procedure,                         the patient's blood pressure, pulse, and oxygen                         saturations were monitored continuously. The                         Colonoscope was introduced through the anus and                         advanced to the the cecum, identified by appendiceal  orifice and ileocecal valve. The colonoscopy was                         somewhat difficult due to a tortuous colon. Successful                         completion of the procedure was aided by using manual                          pressure. The patient tolerated the procedure well.                         The quality of the bowel preparation was excellent. Findings:      Two sessile polyps were found in the rectum. The polyps were 5 mm in       size. These were biopsied with a cold jumbo forceps for histology.      The retroflexed view of the distal rectum and anal verge was normal and       showed no anal or rectal abnormalities. Impression:            - Two 5 mm polyps in the rectum. Biopsied.                        - The distal rectum and anal verge are normal on                         retroflexion view. Recommendation:        - Telephone endoscopist for pathology results in 1                         week. Procedure Code(s):     --- Professional ---                        [phone], Colonoscopy, flexible; with biopsy, single or                         multiple Diagnosis Code(s):     --- Professional ---                        Z86.010, Personal history of colonic polyps                        K62.1, Rectal polyp CPT copyright 2019 American Medical Association. All rights reserved. The codes documented in this report are preliminary and upon coder review may  be revised to meet current compliance requirements. Robert Bellow, MD 05/15/2021 9:02:37 AM This report has been signed electronically. Number of Addenda: 0 Note Initiated On: 05/15/2021 7:46 AM Scope Withdrawal Time: 0 hours 9 minutes 36 seconds  Total Procedure Duration: 0 hours 23 minutes 31 seconds  Estimated Blood Loss:  Estimated blood loss: none.      [facility]

## 2021-05-15 NOTE — Anesthesia Preprocedure Evaluation (Addendum)
Anesthesia Evaluation  Patient identified by MRN, date of birth, ID band Patient awake    Reviewed: Allergy & Precautions, NPO status , Patient's Chart, lab work & pertinent test results  History of Anesthesia Complications Negative for: history of anesthetic complications  Airway Mallampati: II       Dental  (+) Chipped   Pulmonary neg shortness of breath, former smoker,    breath sounds clear to auscultation       Cardiovascular Exercise Tolerance: Good  Rhythm:Regular Rate:Normal     Neuro/Psych  Headaches, Seizures -,  Anxiety Depression    GI/Hepatic negative GI ROS, Neg liver ROS,   Endo/Other  Hypothyroidism   Renal/GU negative Renal ROS     Musculoskeletal negative musculoskeletal ROS (+)   Abdominal   Peds  Hematology negative hematology ROS (+)   Anesthesia Other Findings Past Medical History: No date: Anxiety No date: Arthritis 01/31/2016: Depression No date: Goiter No date: Hyperlipidemia     Comment:  TGs No date: Hypothyroidism No date: Seasonal allergies   Reproductive/Obstetrics                             Anesthesia Physical  Anesthesia Plan  ASA: 2  Anesthesia Plan: General   Post-op Pain Management:    Induction: Intravenous  PONV Risk Score and Plan: TIVA and Propofol infusion  Airway Management Planned: Natural Airway and Nasal Cannula  Additional Equipment:   Intra-op Plan:   Post-operative Plan:   Informed Consent: I have reviewed the patients History and Physical, chart, labs and discussed the procedure including the risks, benefits and alternatives for the proposed anesthesia with the patient or authorized representative who has indicated his/her understanding and acceptance.     Dental Advisory Given  Plan Discussed with: CRNA  Anesthesia Plan Comments: (Patient consented for risks of anesthesia including but not limited to:  -  adverse reactions to medications - risk of airway placement if required - damage to eyes, teeth, lips or other oral mucosa - nerve damage due to positioning  - sore throat or hoarseness - Damage to heart, brain, nerves, lungs, other parts of body or loss of life  Patient voiced understanding.)        Anesthesia Quick Evaluation

## 2021-05-15 NOTE — Transfer of Care (Signed)
Immediate Anesthesia Transfer of Care Note  Patient: Lorraine Mills  Procedure(s) Performed: COLONOSCOPY WITH PROPOFOL  Patient Location: Endoscopy Unit  Anesthesia Type:General  Level of Consciousness: awake, alert  and oriented  Airway & Oxygen Therapy: Patient Spontanous Breathing  Post-op Assessment: Report given to RN and Post -op Vital signs reviewed and stable  Post vital signs: Reviewed  Last Vitals:  Vitals Value Taken Time  BP    Temp    Pulse 76 05/15/21 0906  Resp 16 05/15/21 0906  SpO2 100 % 05/15/21 0906  Vitals shown include unvalidated device data.  Last Pain:  Vitals:   05/15/21 0758  TempSrc: Temporal  PainSc: 0-No pain         Complications: No notable events documented.

## 2021-05-15 NOTE — Anesthesia Postprocedure Evaluation (Signed)
Anesthesia Post Note  Patient: Lorraine Mills  Procedure(s) Performed: COLONOSCOPY WITH PROPOFOL  Patient location during evaluation: Endoscopy Anesthesia Type: General Level of consciousness: awake and alert Pain management: pain level controlled Vital Signs Assessment: post-procedure vital signs reviewed and stable Respiratory status: spontaneous breathing, nonlabored ventilation, respiratory function stable and patient connected to nasal cannula oxygen Cardiovascular status: blood pressure returned to baseline and stable Postop Assessment: no apparent nausea or vomiting Anesthetic complications: no   No notable events documented.   Last Vitals:  Vitals:   05/15/21 0925 05/15/21 0935  BP: 129/72 (!) 143/70  Pulse:    Resp:    Temp:    SpO2:      Last Pain:  Vitals:   05/15/21 0919  TempSrc:   PainSc: 0-No pain                 Precious Haws Hannalee Castor

## 2021-05-15 NOTE — H&P (Signed)
[name] [id number] 12-25-1950     HPI: Healthy 70 y/o woman with a history of tubular adenoma in 2011. Normal exam in 2017.  Nausea with Dulcolax. Tolerated Miralax well.    Medications Prior to Admission  Medication Sig Dispense Refill Last Dose   acetaminophen (TYLENOL) 500 MG tablet Take 500 mg by mouth every 6 (six) hours as needed.      cetirizine (ZYRTEC) 10 MG tablet Take 10 mg by mouth daily as needed for allergies.      fluticasone (FLONASE) 50 MCG/ACT nasal spray Place 1-2 sprays into both nostrils daily as needed for allergies or rhinitis.      gabapentin (NEURONTIN) 100 MG capsule Take 1 capsule (100 mg total) by mouth 3 (three) times daily. 90 capsule 3    levothyroxine (SYNTHROID) 75 MCG tablet TAKE 1 TABLET EVERY DAY ON EMPTY STOMACHWITH A GLASS OF WATER AT LEAST 30-60 MINBEFORE BREAKFAST 90 tablet 1    omeprazole (PRILOSEC) 20 MG capsule TAKE 1 CAPSULE BY MOUTH DAILY 30 capsule 3    prednisoLONE acetate (PRED FORTE) 1 % ophthalmic suspension Place 1 drop into the right eye daily.      rosuvastatin (CRESTOR) 10 MG tablet TAKE ONE TABLET EVERY DAY. 30 tablet 5    traZODone (DESYREL) 50 MG tablet TAKE ONE TABLET AT BEDTIME AS NEEDED FORSLEEP. 90 tablet 1    Allergies  Allergen Reactions   Latex Rash   Levofloxacin     FLUOROQUINOLONES - unknown reaction   Past Medical History:  Diagnosis Date   Anxiety    Arthritis    Depression 01/31/2016   Goiter    Hyperlipidemia    TGs   Hypothyroidism    Seasonal allergies    Past Surgical History:  Procedure Laterality Date   ABDOMINAL HYSTERECTOMY     supracervical    BILATERAL OOPHORECTOMY  2001   BENIGN TUMOR   BREAST BIOPSY Left 1985   cyst    CATARACT EXTRACTION Right    COLONOSCOPY  08/17/2009   COLONOSCOPY WITH PROPOFOL N/A 03/19/2016   Procedure: COLONOSCOPY WITH PROPOFOL;  Surgeon: Robert Bellow, MD;  Location: [facility] ENDOSCOPY;  Service: Endoscopy;  Laterality: N/A;   CORNEAL TRANSPLANT Right    EYE  SURGERY     JOINT REPLACEMENT     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   Procedure: TOTAL HIP ARTHROPLASTY ANTERIOR APPROACH;  Surgeon: Hessie Knows, MD;  Location: [facility] ORS;  Service: Orthopedics;  Laterality: Right;   Social History   Socioeconomic History   Marital status: Married    Spouse name: Not on file   Number of children: Not on file   Years of education: Not on file   Highest education level: Not on file  Occupational History   Not on file  Tobacco Use   Smoking status: Former    Years: 24.00    Types: Cigarettes    Quit date: 05/09/1998    Years since quitting: 23.0   Smokeless tobacco: Never  Vaping Use   Vaping Use: Never used  Substance and Sexual Activity   Alcohol use: Yes    Alcohol/week: 0.0 standard drinks    Comment: rarely    Drug use: No   Sexual activity: Not on file  Other Topics Concern   Not on file  Social History Narrative   Not on file   Social Determinants of Health   Financial Resource Strain: Low Risk    Difficulty of Paying Living Expenses: Not hard at  all  Food Insecurity: No Food Insecurity   Worried About Charity fundraiser in the Last Year: Never true   Ran Out of Food in the Last Year: Never true  Transportation Needs: No Transportation Needs   Lack of Transportation (Medical): No   Lack of Transportation (Non-Medical): No  Physical Activity: Insufficiently Active   Days of Exercise per Week: 3 days   Minutes of Exercise per Session: 30 min  Stress: No Stress Concern Present   Feeling of Stress : Not at all  Social Connections: Unknown   Frequency of Communication with Friends and Family: Not on file   Frequency of Social Gatherings with Friends and Family: Not on file   Attends Religious Services: Not on Electrical engineer or Organizations: Not on file   Attends Archivist Meetings: Not on file   Marital Status: Married  Human resources officer Violence: Not At Risk   Fear of Current or Ex-Partner: No    Emotionally Abused: No   Physically Abused: No   Sexually Abused: No   Social History   Social History Narrative   Not on file     ROS: Negative.     PE: HEENT: Negative. Lungs: Clear. Cardio: RR.  Assessment/Plan:  Proceed with planned endoscopy.   Lorraine Mills 05/15/2021

## 2021-05-16 ENCOUNTER — Encounter: Payer: Self-pay | Admitting: General Surgery

## 2021-05-16 LAB — SURGICAL PATHOLOGY

## 2021-05-17 ENCOUNTER — Other Ambulatory Visit: Payer: Self-pay

## 2021-05-17 ENCOUNTER — Ambulatory Visit
Admission: RE | Admit: 2021-05-17 | Discharge: 2021-05-17 | Disposition: A | Discharge status: Home / Self Care | Payer: PPO | Source: Ambulatory Visit | Attending: Internal Medicine | Admitting: Internal Medicine

## 2021-05-17 DIAGNOSIS — N6321 Unspecified lump in the left breast, upper outer quadrant: Secondary | ICD-10-CM | POA: Insufficient documentation

## 2021-05-17 DIAGNOSIS — R922 Inconclusive mammogram: Secondary | ICD-10-CM | POA: Diagnosis not present

## 2021-05-17 LAB — HM COLONOSCOPY

## 2021-06-19 ENCOUNTER — Other Ambulatory Visit: Payer: Self-pay | Admitting: Internal Medicine

## 2021-08-07 IMAGING — XA DG HIP (WITH PELVIS) OPERATIVE*R*
4 series · 10 of 10 positions shown · non-contrast
Comparison: None.

CLINICAL DATA: Right total hip replacement.

EXAM:
OPERATIVE right HIP (WITH PELVIS IF PERFORMED) 4 VIEWS
TECHNIQUE: Fluoroscopic spot image(s) were submitted for interpretation
post-operatively.
Radiation exposure index: 4.8 mGy.

[Series 1: ortho standard · 4 of 9 frames shown (1 of 4)]
[frame 2/9]
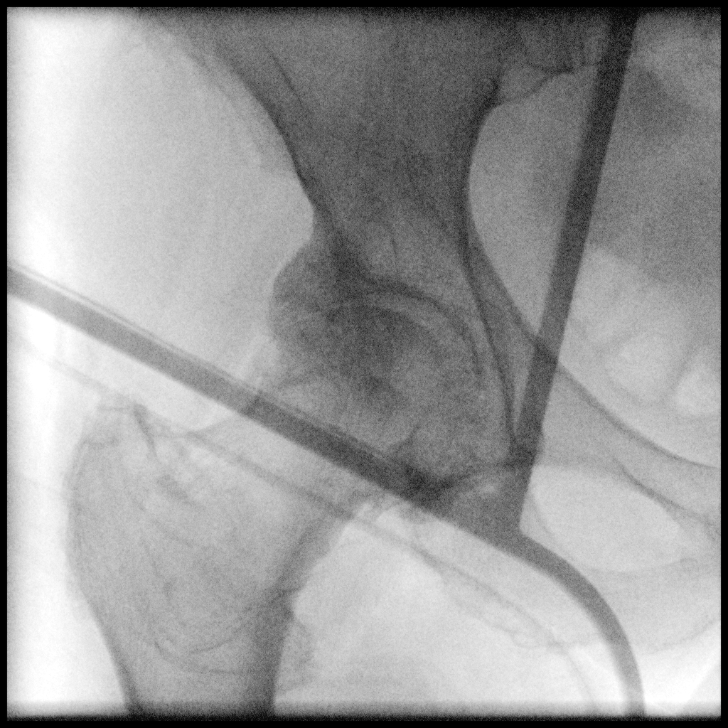
[frame 5/9]
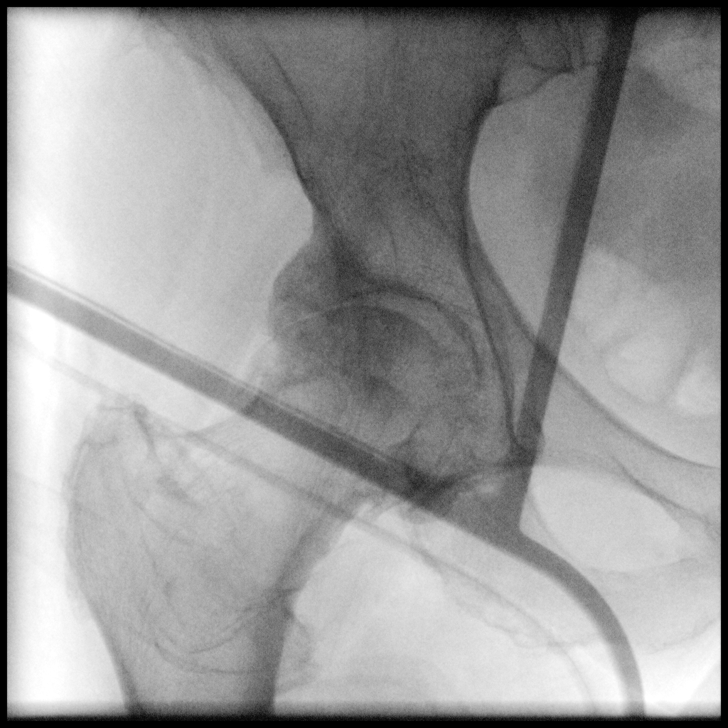
[frame 8/9]
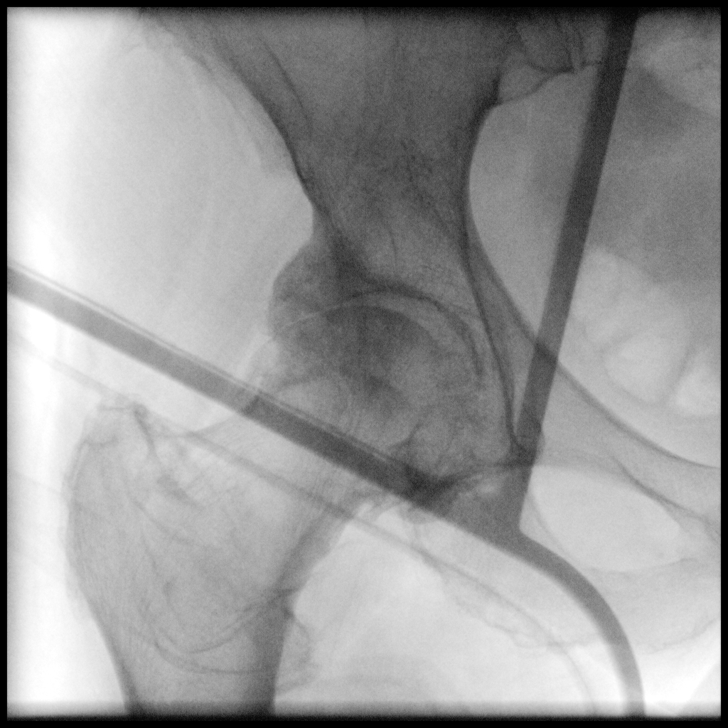
[frame 9/9]
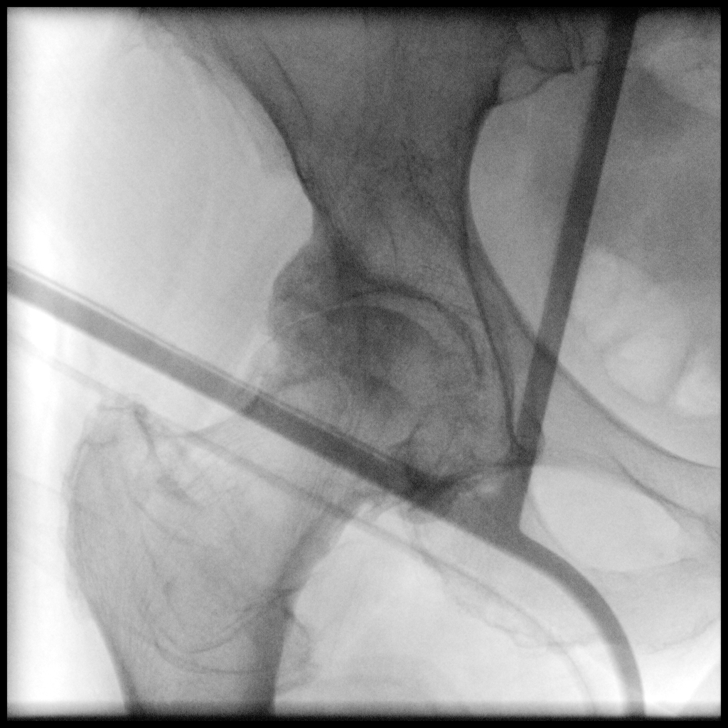

[Series 2: ortho standard · 4 of 13 frames shown (2 of 4)]
[frame 2/13]
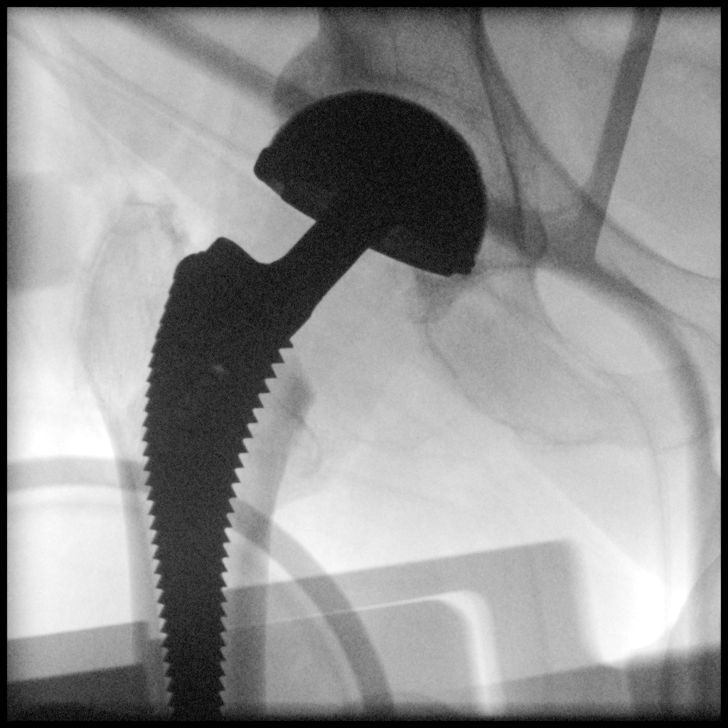
[frame 3/13]
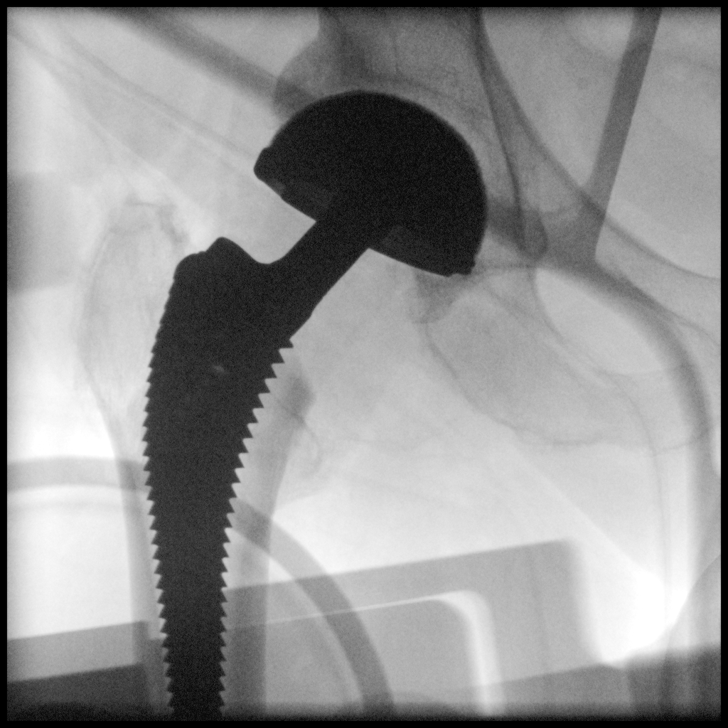
[frame 7/13]
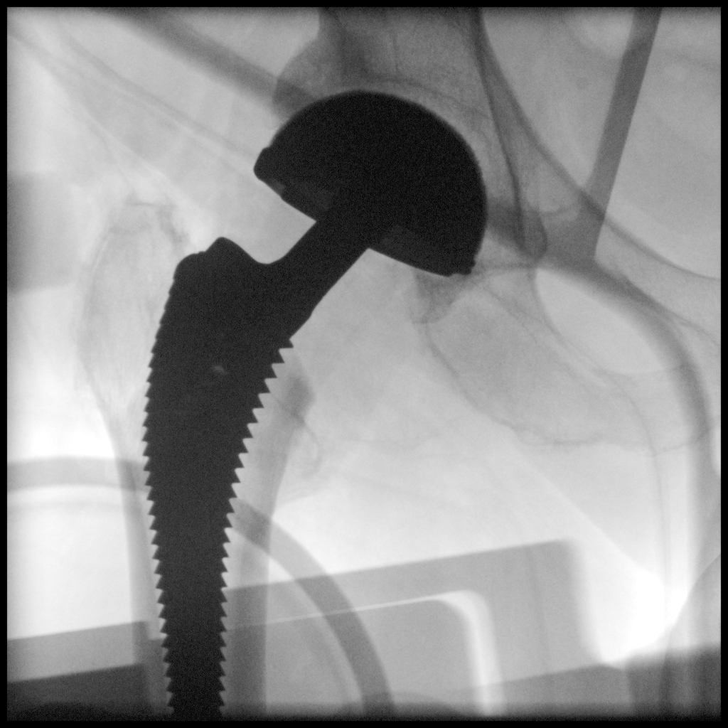
[frame 12/13]
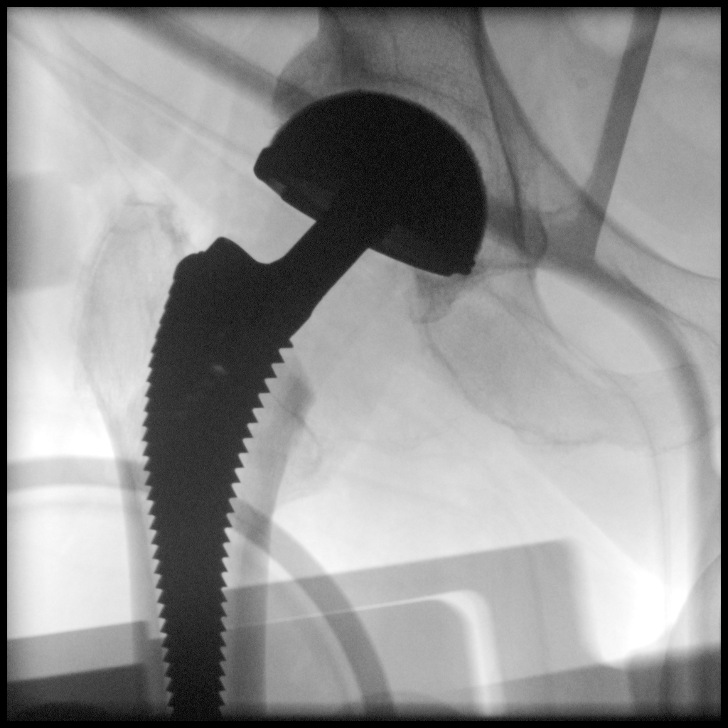

[Series 4: ortho standard · 1 of 1 slices shown (3 of 4)]
[im 1/1]
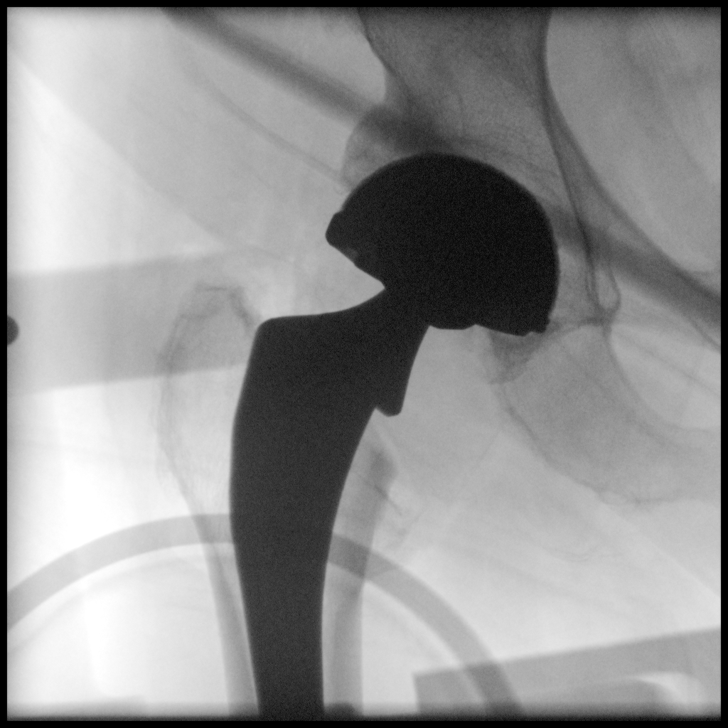

[Series 6: ortho standard · 1 of 1 slices shown (4 of 4)]
[im 1/1]
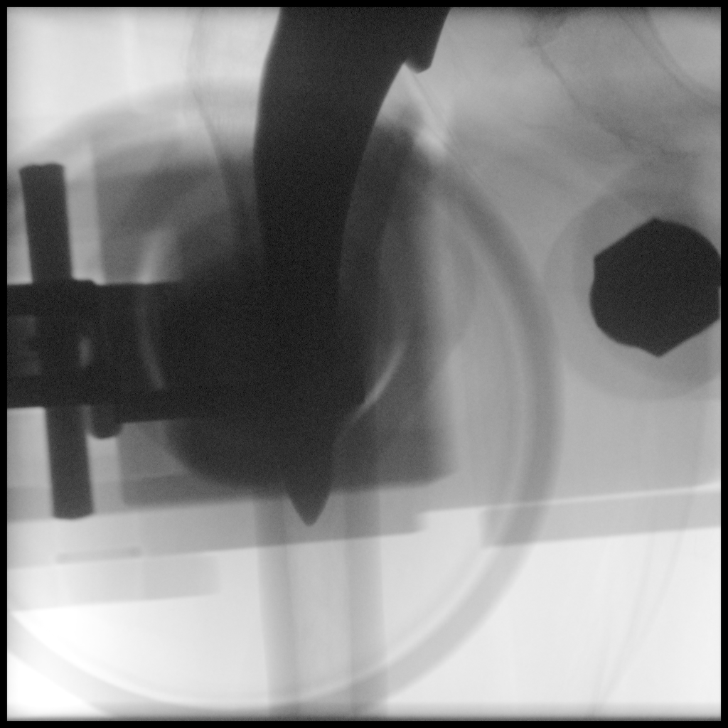

[10 of 10 positions shown; findings below may reference images not displayed]

FINDINGS: Four intraoperative fluoroscopic images were obtained the right hip.
The right acetabular and femoral components appear to be well
situated.
IMPRESSION: Status post right total hip arthroplasty.

## 2021-09-21 ENCOUNTER — Other Ambulatory Visit: Payer: Self-pay | Admitting: Internal Medicine

## 2021-11-06 ENCOUNTER — Ambulatory Visit: Payer: PPO | Admitting: Internal Medicine

## 2021-12-21 ENCOUNTER — Other Ambulatory Visit: Payer: Self-pay | Admitting: Internal Medicine

## 2022-01-03 ENCOUNTER — Other Ambulatory Visit: Payer: Self-pay | Admitting: Internal Medicine

## 2022-02-05 ENCOUNTER — Encounter: Payer: Self-pay | Admitting: Internal Medicine

## 2022-02-05 ENCOUNTER — Ambulatory Visit (INDEPENDENT_AMBULATORY_CARE_PROVIDER_SITE_OTHER): Payer: PPO | Admitting: Internal Medicine

## 2022-02-05 VITALS — BP 126/72 | HR 90 | Temp 98.4°F | Ht 61.0 in | Wt 174.4 lb

## 2022-02-05 DIAGNOSIS — F409 Phobic anxiety disorder, unspecified: Secondary | ICD-10-CM

## 2022-02-05 DIAGNOSIS — Z1231 Encounter for screening mammogram for malignant neoplasm of breast: Secondary | ICD-10-CM

## 2022-02-05 DIAGNOSIS — E781 Pure hyperglyceridemia: Secondary | ICD-10-CM | POA: Diagnosis not present

## 2022-02-05 DIAGNOSIS — E785 Hyperlipidemia, unspecified: Secondary | ICD-10-CM

## 2022-02-05 DIAGNOSIS — E669 Obesity, unspecified: Secondary | ICD-10-CM | POA: Diagnosis not present

## 2022-02-05 DIAGNOSIS — Z96641 Presence of right artificial hip joint: Secondary | ICD-10-CM

## 2022-02-05 DIAGNOSIS — Z0001 Encounter for general adult medical examination with abnormal findings: Secondary | ICD-10-CM

## 2022-02-05 DIAGNOSIS — Z23 Encounter for immunization: Secondary | ICD-10-CM | POA: Diagnosis not present

## 2022-02-05 DIAGNOSIS — M25571 Pain in right ankle and joints of right foot: Secondary | ICD-10-CM

## 2022-02-05 DIAGNOSIS — G8929 Other chronic pain: Secondary | ICD-10-CM

## 2022-02-05 DIAGNOSIS — F5105 Insomnia due to other mental disorder: Secondary | ICD-10-CM

## 2022-02-05 DIAGNOSIS — R7301 Impaired fasting glucose: Secondary | ICD-10-CM

## 2022-02-05 DIAGNOSIS — E039 Hypothyroidism, unspecified: Secondary | ICD-10-CM

## 2022-02-05 LAB — COMPREHENSIVE METABOLIC PANEL
ALT: 16 U/L (ref 0–35)
AST: 17 U/L (ref 0–37)
Albumin: 4.4 g/dL (ref 3.5–5.2)
Alkaline Phosphatase: 60 U/L (ref 39–117)
BUN: 17 mg/dL (ref 6–23)
CO2: 25 mEq/L (ref 19–32)
Calcium: 9.8 mg/dL (ref 8.4–10.5)
Chloride: 104 mEq/L (ref 96–112)
Creatinine, Ser: 0.77 mg/dL (ref 0.40–1.20)
GFR: 77.95 mL/min (ref >60.00)
Glucose, Bld: 79 mg/dL (ref 70–99)
Potassium: 4 mEq/L (ref 3.5–5.1)
Sodium: 140 mEq/L (ref 135–145)
Total Bilirubin: 0.4 mg/dL (ref 0.2–1.2)
Total Protein: 7.2 g/dL (ref 6.0–8.3)

## 2022-02-05 LAB — LIPID PANEL
Cholesterol: 195 mg/dL (ref 0–200)
HDL: 37.1 mg/dL — ABNORMAL LOW (ref >39.00)
Total CHOL/HDL Ratio: 5
Triglycerides: 493 mg/dL — ABNORMAL HIGH (ref 0.0–149.0)

## 2022-02-05 LAB — TSH: TSH: 2.25 u[IU]/mL (ref 0.35–5.50)

## 2022-02-05 LAB — LDL CHOLESTEROL, DIRECT: Direct LDL: 65 mg/dL

## 2022-02-05 NOTE — Assessment & Plan Note (Signed)
Improving without intervention

## 2022-02-05 NOTE — Progress Notes (Unsigned)
Subjective:  Patient ID: Lorraine Mills, female    DOB: 1950/12/06  Age: 71 y.o. MRN: [id number]  CC: The primary encounter diagnosis was Acquired hypothyroidism. A diagnosis of Dyslipidemia (high LDL; low HDL) was also pertinent to this visit.   HPI Lorraine Mills presents for  Chief Complaint  Patient presents with   Follow-up   1) Expecting her first granddaughter in October 4 . 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    3) married x 43 years.  Relationship stable.  4) not exercising .  "I don't feel like it" right leg,  right hip bothers her with  walking any distance. Despite hip replacement   5) obesity: has gained 9 lbs over the past year .  Outpatient Medications Prior to Visit  Medication Sig Dispense Refill   acetaminophen (TYLENOL) 500 MG tablet Take 500 mg by mouth every 6 (six) hours as needed.     cetirizine (ZYRTEC) 10 MG tablet Take 10 mg by mouth daily as needed for allergies.     fluticasone (FLONASE) 50 MCG/ACT nasal spray Place 1-2 sprays into both nostrils daily as needed for allergies or rhinitis.     gabapentin (NEURONTIN) 100 MG capsule Take 1 capsule (100 mg total) by mouth 3 (three) times daily. 90 capsule 3   levothyroxine (SYNTHROID) 75 MCG tablet TAKE 1 TABLET EVERY DAY ON EMPTY STOMACHWITH A GLASS OF WATER AT LEAST 30-60 MINBEFORE BREAKFAST 90 tablet 1   omeprazole (PRILOSEC) 20 MG capsule TAKE 1 CAPSULE BY MOUTH DAILY 30 capsule 3   prednisoLONE acetate (PRED FORTE) 1 % ophthalmic suspension Place 1 drop into the right eye daily.     RESTASIS 0.05 % ophthalmic emulsion 1 drop 2 (two) times daily.     rosuvastatin (CRESTOR) 10 MG tablet TAKE ONE TABLET EVERY DAY. 30 tablet 5   traZODone (DESYREL) 50 MG tablet TAKE ONE TABLET AT BEDTIME AS NEEDED FORSLEEP. 90 tablet 1   No facility-administered medications prior to visit.    Review of Systems;  Patient  denies headache, fevers, malaise, unintentional weight loss, skin rash, eye pain, sinus congestion and sinus pain, sore throat, dysphagia,  hemoptysis , cough, dyspnea, wheezing, chest pain, palpitations, orthopnea, edema, abdominal pain, nausea, melena, diarrhea, constipation, flank pain, dysuria, hematuria, urinary  Frequency, nocturia, numbness, tingling, seizures,  Focal weakness, Loss of consciousness,  Tremor, insomnia, depression, anxiety, and suicidal ideation.      Objective:  BP 126/72 (BP Location: Left Arm, Patient Position: Sitting, Cuff Size: Normal)   Pulse 90   Temp 98.4 F (36.9 C) (Oral)   Ht 5' 1" (1.549 m)   Wt 174 lb 6.4 oz (79.1 kg)   SpO2 94%   BMI 32.95 kg/m   BP Readings from Last 3 Encounters:  02/05/22 126/72  05/15/21 (!) 143/70  05/08/21 120/78    Wt Readings from Last 3 Encounters:  02/05/22 174 lb 6.4 oz (79.1 kg)  05/15/21 166 lb (75.3 kg)  05/08/21 169 lb 12.8 oz (77 kg)    General appearance: alert, cooperative and appears stated age Ears: normal TM's and external ear canals both ears Throat: lips, mucosa, and tongue normal; teeth and gums normal Neck: no adenopathy, no carotid bruit, supple, symmetrical, trachea midline and thyroid not enlarged, symmetric, no tenderness/mass/nodules Back: symmetric, no curvature. ROM normal. No CVA tenderness. Lungs: clear to auscultation bilaterally Heart:  regular rate and rhythm, S1, S2 normal, no murmur, click, rub or gallop Abdomen: soft, non-tender; bowel sounds normal; no masses,  no organomegaly Pulses: 2+ and symmetric Skin: Skin color, texture, turgor normal. No rashes or lesions Lymph nodes: Cervical, supraclavicular, and axillary nodes normal.  Lab Results  Component Value Date   HGBA1C 5.5 10/04/2019   HGBA1C 5.6 02/09/2019   HGBA1C 5.5 03/26/2018    Lab Results  Component Value Date   CREATININE 0.76 03/20/2021   CREATININE 0.67 11/02/2020   CREATININE 0.72 11/01/2020    Lab Results   Component Value Date   WBC 5.2 03/20/2021   HGB 14.0 03/20/2021   HCT 42.2 03/20/2021   PLT 166.0 03/20/2021   GLUCOSE 90 03/20/2021   CHOL 162 03/20/2021   TRIG 363.0 (H) 03/20/2021   HDL 42.30 03/20/2021   LDLDIRECT 59.0 03/20/2021   LDLCALC 60 02/02/2020   ALT 17 03/20/2021   AST 20 03/20/2021   NA 140 03/20/2021   K 4.3 03/20/2021   CL 105 03/20/2021   CREATININE 0.76 03/20/2021   BUN 16 03/20/2021   CO2 29 03/20/2021   TSH 1.78 03/20/2021   HGBA1C 5.5 10/04/2019   MICROALBUR <0.7 03/20/2021    MM DIAG BREAST TOMO BILATERAL  Result Date: 05/17/2021 CLINICAL DATA:  Patient presents for evaluation of palpable abnormality left breast. EXAM: DIGITAL DIAGNOSTIC BILATERAL MAMMOGRAM WITH TOMOSYNTHESIS AND CAD; ULTRASOUND LEFT BREAST LIMITED TECHNIQUE: Bilateral digital diagnostic mammography and breast tomosynthesis was performed. The images were evaluated with computer-aided detection.; Targeted ultrasound examination of the left breast was performed. COMPARISON:  Previous exam(s). ACR Breast Density Category c: The breast tissue is heterogeneously dense, which may obscure small masses. FINDINGS: No concerning masses, calcifications or distortion identified within either breast. No suspicious abnormality underlying the palpable marker within the left breast. On physical exam, dense tissue is palpated within the upper-outer left breast. Targeted ultrasound is performed, showing normal dense tissue without suspicious mass within the left breast 2:30 o'clock 6 cm from nipple. IMPRESSION: No mammographic evidence for malignancy. Palpable abnormality left breast corresponds with dense fibroglandular tissue. RECOMMENDATION: Screening mammogram in one year.(Code:SM-B-01Y). Continued clinical evaluation for palpable abnormality left breast. I have discussed the findings and recommendations with the patient. If applicable, a reminder letter will be sent to the patient regarding the next appointment.  BI-RADS CATEGORY  1: Negative. Electronically Signed   By: Lovey Newcomer M.D.   On: 05/17/2021 09:52  US BREAST LTD UNI LEFT INC AXILLA  Result Date: 05/17/2021 CLINICAL DATA:  Patient presents for evaluation of palpable abnormality left breast. EXAM: DIGITAL DIAGNOSTIC BILATERAL MAMMOGRAM WITH TOMOSYNTHESIS AND CAD; ULTRASOUND LEFT BREAST LIMITED TECHNIQUE: Bilateral digital diagnostic mammography and breast tomosynthesis was performed. The images were evaluated with computer-aided detection.; Targeted ultrasound examination of the left breast was performed. COMPARISON:  Previous exam(s). ACR Breast Density Category c: The breast tissue is heterogeneously dense, which may obscure small masses. FINDINGS: No concerning masses, calcifications or distortion identified within either breast. No suspicious abnormality underlying the palpable marker within the left breast. On physical exam, dense tissue is palpated within the upper-outer left breast. Targeted ultrasound is performed, showing normal dense tissue without suspicious mass within the left breast 2:30 o'clock 6 cm from nipple. IMPRESSION: No mammographic evidence for malignancy. Palpable abnormality left breast corresponds with dense fibroglandular tissue. RECOMMENDATION: Screening mammogram in one year.(Code:SM-B-01Y). Continued clinical evaluation for palpable abnormality left breast. I have discussed the findings and recommendations with the patient. If applicable, a  reminder letter will be sent to the patient regarding the next appointment. BI-RADS CATEGORY  1: Negative. Electronically Signed   By: Lovey Newcomer M.D.   On: 05/17/2021 09:52   Assessment & Plan:   Problem List Items Addressed This Visit     Dyslipidemia (high LDL; low HDL)   Relevant Orders   Comp Met (CMET)   Lipid Profile   Direct LDL   Hypothyroidism - Primary   Relevant Orders   TSH    I spent a total of   minutes with this patient in a face to face visit on the date of  this encounter reviewing the last office visit with me on        ,  most recent with patient's cardiologist in    ,  patient'ss diet and eating habits, home blood pressure readings ,  most recent imaging study ,   and post visit ordering of testing and therapeutics.    Follow-up: No follow-ups on file.   [name], MD

## 2022-02-06 MED ORDER — FENOFIBRATE 134 MG PO CAPS: 134.0000 mg | ORAL_CAPSULE | Freq: Every day | ORAL | 11 refills | Status: AC

## 2022-02-06 NOTE — Assessment & Plan Note (Signed)
Worsening intermittently.  Advised to Continue use of trazodone;  Ok to increase dose to 75 mg

## 2022-02-06 NOTE — Assessment & Plan Note (Signed)
Thyroid function has been WNL on current dose of 75 mcg daily .  No current changes needed.    Lab Results  Component Value Date   TSH 2.25 02/05/2022

## 2022-02-06 NOTE — Assessment & Plan Note (Signed)

## 2022-02-06 NOTE — Assessment & Plan Note (Signed)
LDL is at goal with crestor,  But Triglycerides remain quite  elevated today to over 400.  Will recommend addition of fenofibrate  Lab Results  Component Value Date   CHOL 195 02/05/2022   HDL 37.10 (L) 02/05/2022   LDLCALC 60 02/02/2020   LDLDIRECT 65.0 02/05/2022   TRIG (H) 02/05/2022    493.0 Triglyceride is over 400; calculations on Lipids are invalid.   CHOLHDL 5 02/05/2022

## 2022-02-06 NOTE — Assessment & Plan Note (Addendum)
Weight gain adressed.  She is not exercising or even walking daily .  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  I have addressed  BMI and recommended wt loss of 10% of body weigh over the next 6 months using a low glycemic index diet and regular exercise a minimum of 5 days per week.

## 2022-02-06 NOTE — Assessment & Plan Note (Signed)
a1c remains  well  below the threshhold for prediabetes.   She is following a low glycemic index diet but not exercising.   Lab Results  Component Value Date   HGBA1C 5.5 10/04/2019

## 2022-02-06 NOTE — Assessment & Plan Note (Signed)
Advised to follow up with orthopedics for evaluation of persistent pain.

## 2022-02-08 NOTE — Addendum Note (Signed)
Addended by: [name] on: 02/08/2022 12:04 PM   Modules accepted: Orders

## 2022-03-06 ENCOUNTER — Other Ambulatory Visit: Payer: Self-pay | Admitting: Internal Medicine

## 2022-03-10 ENCOUNTER — Other Ambulatory Visit
Admission: RE | Admit: 2022-03-10 | Discharge: 2022-03-10 | Disposition: A | Discharge status: Home / Self Care | Payer: PPO | Source: Ambulatory Visit | Attending: Student | Admitting: Student

## 2022-03-10 DIAGNOSIS — R0602 Shortness of breath: Secondary | ICD-10-CM | POA: Insufficient documentation

## 2022-03-10 LAB — TROPONIN I (HIGH SENSITIVITY): Troponin I (High Sensitivity): 5 ng/L (ref <18)

## 2022-03-10 LAB — D-DIMER, QUANTITATIVE: D-Dimer, Quant: 0.53 ug/mL-FEU — ABNORMAL HIGH (ref 0.00–0.50)

## 2022-03-14 ENCOUNTER — Other Ambulatory Visit: Payer: Self-pay

## 2022-03-14 ENCOUNTER — Emergency Department: Payer: PPO

## 2022-03-14 ENCOUNTER — Emergency Department
Admission: EM | Admit: 2022-03-14 | Discharge: 2022-03-14 | Disposition: A | Discharge status: Home / Self Care | Payer: PPO | Attending: Emergency Medicine | Admitting: Emergency Medicine

## 2022-03-14 DIAGNOSIS — R0602 Shortness of breath: Secondary | ICD-10-CM | POA: Diagnosis not present

## 2022-03-14 DIAGNOSIS — R0789 Other chest pain: Secondary | ICD-10-CM | POA: Insufficient documentation

## 2022-03-14 DIAGNOSIS — R079 Chest pain, unspecified: Secondary | ICD-10-CM | POA: Diagnosis present

## 2022-03-14 LAB — CBC
HCT: 42.9 % (ref 36.0–46.0)
Hemoglobin: 14.2 g/dL (ref 12.0–15.0)
MCH: 31.1 pg (ref 26.0–34.0)
MCHC: 33.1 g/dL (ref 30.0–36.0)
MCV: 93.9 fL (ref 80.0–100.0)
Platelets: 281 10*3/uL (ref 150–400)
RBC: 4.57 MIL/uL (ref 3.87–5.11)
RDW: 11.9 % (ref 11.5–15.5)
WBC: 12.5 10*3/uL — ABNORMAL HIGH (ref 4.0–10.5)
nRBC: 0 % (ref 0.0–0.2)

## 2022-03-14 LAB — BASIC METABOLIC PANEL
Anion gap: 9 (ref 5–15)
BUN: 23 mg/dL (ref 8–23)
CO2: 25 mmol/L (ref 22–32)
Calcium: 9.5 mg/dL (ref 8.9–10.3)
Chloride: 104 mmol/L (ref 98–111)
Creatinine, Ser: 0.81 mg/dL (ref 0.44–1.00)
GFR, Estimated: >60 mL/min (ref >60)
Glucose, Bld: 141 mg/dL — ABNORMAL HIGH (ref 70–99)
Potassium: 3.1 mmol/L — ABNORMAL LOW (ref 3.5–5.1)
Sodium: 138 mmol/L (ref 135–145)

## 2022-03-14 LAB — TROPONIN I (HIGH SENSITIVITY): Troponin I (High Sensitivity): 5 ng/L (ref <18)

## 2022-03-14 NOTE — ED Triage Notes (Signed)
Pt arrives with c/o SOB and CP that started yesterday. Pt seen recently for same and given steroid. Pt endorses being sick recently with cold symptoms.

## 2022-03-14 NOTE — Discharge Instructions (Addendum)
Please use ibuprofen (Motrin) up to 800 mg every 8 hours, naproxen (Naprosyn) up to 500 mg every 12 hours, and/or acetaminophen (Tylenol) up to 4 g/day for any continued pain. You may use Motrin/naproxen in addition to Tylenol at the same time for better pain control -Please do not lift anything over 10lbs (1 gallon of milk) with your right hand for the next week

## 2022-03-14 NOTE — ED Provider Notes (Signed)
[facility] Provider Note   Event Date/Time   First MD Initiated Contact with Patient 03/14/22 1231     (approximate) History  Shortness of Breath  HPI Lorraine Mills is a 71 y.o. female with stated recent past medical history of upper respiratory infection who presents for right-sided chest pain that radiates around to her back and has been present over the last few days.  Patient states that taking a deep breath or movement worsens this pain and she denies any relieving factors.  Patient has been taking ibuprofen and Tylenol to try to control this pain with minimal relief.  Patient denies any exertional worsening of this pain.  Patient denies any personal or family history of CAD. ROS: Patient currently denies any vision changes, tinnitus, difficulty speaking, facial droop, sore throat, shortness of breath, abdominal pain, nausea/vomiting/diarrhea, dysuria, or weakness/numbness/paresthesias in any extremity   Physical Exam  Triage Vital Signs: ED Triage Vitals  Enc Vitals Group     BP 03/14/22 1115 (!) 164/73     Pulse Rate 03/14/22 1115 90     Resp 03/14/22 1115 20     Temp 03/14/22 1115 97.6 F (36.4 C)     Temp Source 03/14/22 1115 Oral     SpO2 03/14/22 1115 95 %     Weight 03/14/22 1112 168 lb (76.2 kg)     Height 03/14/22 1112 5\' 1"  (1.549 m)     Head Circumference --      Peak Flow --      Pain Score 03/14/22 1112 9     Pain Loc --      Pain Edu? --      Excl. in [place]? --    Most recent vital signs: Vitals:   03/14/22 1115  BP: (!) 164/73  Pulse: 90  Resp: 20  Temp: 97.6 F (36.4 C)  SpO2: 95%   General: Awake, oriented x4. CV:  Good peripheral perfusion.  Resp:  Normal effort.  Abd:  No distention.  Other:  Obese elderly Caucasian female laying in bed in no acute distress ED Results / Procedures / Treatments  Labs (all labs ordered are listed, but only abnormal results are displayed) Labs Reviewed  BASIC METABOLIC PANEL - Abnormal;  Notable for the following components:      Result Value   Potassium 3.1 (*)    Glucose, Bld 141 (*)    All other components within normal limits  CBC - Abnormal; Notable for the following components:   WBC 12.5 (*)    All other components within normal limits  TROPONIN I (HIGH SENSITIVITY)   EKG ED ECG REPORT I, Naaman Plummer, the attending physician, personally viewed and interpreted this ECG. Date: 03/14/2022 EKG Time: 1114 Rate: 89 Rhythm: normal sinus rhythm QRS Axis: normal Intervals: normal ST/T Wave abnormalities: normal Narrative Interpretation: no evidence of acute ischemia RADIOLOGY ED MD interpretation: 2 view chest x-ray interpreted by me shows no evidence of acute abnormalities including no pneumonia, pneumothorax, or widened mediastinum -Agree with radiology assessment Official radiology report(s): DG Chest 2 View  Result Date: 03/14/2022 CLINICAL DATA:  Chest pain and shortness of breath EXAM: CHEST - 2 VIEW COMPARISON:  None Available. FINDINGS: The heart size and mediastinal contours are within normal limits. Both lungs are clear. The visualized skeletal structures are unremarkable. IMPRESSION: No acute cardiopulmonary abnormality. Electronically Signed   By: Beryle Flock M.D.   On: 03/14/2022 11:58   PROCEDURES: Critical Care performed: No .1-3  Lead EKG Interpretation  Performed by: Naaman Plummer, MD Authorized by: Naaman Plummer, MD     Interpretation: normal     ECG rate:  89   ECG rate assessment: normal     Rhythm: sinus rhythm     Ectopy: none     Conduction: normal    MEDICATIONS ORDERED IN ED: Medications - No data to display IMPRESSION / MDM / [place] / ED COURSE  I reviewed the triage vital signs and the nursing notes.                             The patient is on the cardiac monitor to evaluate for evidence of arrhythmia and/or significant heart rate changes. Patient's presentation is most consistent with acute  presentation with potential threat to life or bodily function. This patient presents with atypical chest pain, most likely secondary to musculoskeletal injury. Differential diagnosis includes rib fracture, costochondritis, sternal fracture. Low suspicion for ACS, acute PE (PERC negative), pericarditis / myocarditis, thoracic aortic dissection, pneumothorax, pneumonia or other acute infectious process. Presentation not consistent with other acute, emergent causes of chest pain at this time. No indication for cardiac enzyme testing. Plan to order CXR to evaluate for acute cardiopulmonary causes.  Plan: EKG, CXR, pain control  Dispo: Discharge home with home care   FINAL CLINICAL IMPRESSION(S) / ED DIAGNOSES   Final diagnoses:  Right-sided chest wall pain   Rx / DC Orders   ED Discharge Orders     None      Note:  This document was prepared using Dragon voice recognition software and may include unintentional dictation errors.   Naaman Plummer, MD 03/14/22 [phone]

## 2022-03-18 ENCOUNTER — Ambulatory Visit
Admission: RE | Admit: 2022-03-18 | Discharge: 2022-03-18 | Disposition: A | Discharge status: Home / Self Care | Payer: PPO | Source: Ambulatory Visit | Attending: Internal Medicine | Admitting: Internal Medicine

## 2022-03-18 ENCOUNTER — Encounter: Payer: Self-pay | Admitting: Internal Medicine

## 2022-03-18 ENCOUNTER — Ambulatory Visit (INDEPENDENT_AMBULATORY_CARE_PROVIDER_SITE_OTHER): Payer: PPO | Admitting: Internal Medicine

## 2022-03-18 VITALS — BP 138/78 | HR 87 | Temp 97.5°F | Ht 61.0 in | Wt 170.6 lb

## 2022-03-18 DIAGNOSIS — R9089 Other abnormal findings on diagnostic imaging of central nervous system: Secondary | ICD-10-CM | POA: Diagnosis not present

## 2022-03-18 DIAGNOSIS — Z8669 Personal history of other diseases of the nervous system and sense organs: Secondary | ICD-10-CM

## 2022-03-18 DIAGNOSIS — R110 Nausea: Secondary | ICD-10-CM

## 2022-03-18 DIAGNOSIS — E876 Hypokalemia: Secondary | ICD-10-CM | POA: Diagnosis not present

## 2022-03-18 DIAGNOSIS — M899 Disorder of bone, unspecified: Secondary | ICD-10-CM

## 2022-03-18 DIAGNOSIS — G936 Cerebral edema: Secondary | ICD-10-CM

## 2022-03-18 DIAGNOSIS — R519 Headache, unspecified: Secondary | ICD-10-CM

## 2022-03-18 DIAGNOSIS — J329 Chronic sinusitis, unspecified: Secondary | ICD-10-CM

## 2022-03-18 DIAGNOSIS — B3731 Acute candidiasis of vulva and vagina: Secondary | ICD-10-CM

## 2022-03-18 MED ORDER — ONDANSETRON HCL 4 MG PO TABS
4.0000 mg | ORAL_TABLET | Freq: Once | ORAL | Status: AC
  Administered 2022-03-18: 4 mg via ORAL

## 2022-03-18 MED ORDER — METHYLPREDNISOLONE ACETATE 40 MG/ML IJ SUSP
40.0000 mg | Freq: Once | INTRAMUSCULAR | Status: AC
  Administered 2022-03-18: 40 mg via INTRAMUSCULAR

## 2022-03-18 MED ORDER — AZITHROMYCIN 250 MG PO TABS: ORAL_TABLET | ORAL | 0 refills | Status: DC

## 2022-03-18 MED ORDER — FLUCONAZOLE 150 MG PO TABS: 150.0000 mg | ORAL_TABLET | Freq: Once | ORAL | 0 refills | Status: AC

## 2022-03-18 MED ORDER — ONDANSETRON 4 MG PO TBDP: 4.0000 mg | ORAL_TABLET | Freq: Three times a day (TID) | ORAL | 0 refills | Status: DC | PRN

## 2022-03-18 MED ORDER — ONDANSETRON HCL 4 MG PO TABS: 4.0000 mg | ORAL_TABLET | Freq: Three times a day (TID) | ORAL | 0 refills | Status: DC | PRN

## 2022-03-18 NOTE — Progress Notes (Signed)
Pt was administered Depomedrol 40 mg into the left deltoid. Pt tolerated shot well, pt was also given 4 mg of Ondanestron for her nausea.   [place], CMA

## 2022-03-18 NOTE — Addendum Note (Signed)
Addended by: Orland Mustard on: 03/18/2022 05:53 PM   Modules accepted: Orders

## 2022-03-18 NOTE — Progress Notes (Addendum)
Chief Complaint  Patient presents with   Follow-up    Pt has had a headache for a month and knot the back of her head for 3 weeks.    F/u  1. H/a x 1 month tried muscle relaxer with urgent care w/o help left sided h/a x 1 month with nausea daily and today she vomited in the office and not feeling well BP 138/78 at home lower than this per pt. The room is not spinning she does have h/o migraines last in 2012 and she just took otc meds for this  Appetite down due to nausea and had crackers and yogurt today  Vision ok no weakness arms/legs Advil alternating with tylenol is not helping  She also mentions neck pain prior CT neck in 2022 + cervical arthritis  2. Then after h/a felt mass in the back of head not painful saw urgent care not imaging for either 3. Resolved Chest pain seen in the UC or ED and improved taking advil alternating with tylenol  Cxr neg  Trop x 2 negative Covid neg monday 4 c/o sinus congestion and pressure in 10/2021 had right ruptured ear drop and given Abx ? Name and drops    Review of Systems  Constitutional:  Negative for weight loss.  HENT:  Negative for hearing loss.   Eyes:  Negative for blurred vision.  Respiratory:  Negative for shortness of breath.   Cardiovascular:  Negative for chest pain.  Gastrointestinal:  Positive for nausea and vomiting. Negative for abdominal pain and blood in stool.  Genitourinary:  Negative for dysuria.  Musculoskeletal:  Positive for neck pain. Negative for falls and joint pain.  Skin:  Negative for rash.  Neurological:  Positive for headaches.  Psychiatric/Behavioral:  Negative for depression.    Past Medical History:  Diagnosis Date   Anxiety    Arthritis    Depression 01/31/2016   Goiter    Hyperlipidemia    TGs   Hypothyroidism    Seasonal allergies    Past Surgical History:  Procedure Laterality Date   ABDOMINAL HYSTERECTOMY     supracervical    BILATERAL OOPHORECTOMY  2001   BENIGN TUMOR   BREAST BIOPSY Left  1985   cyst    CATARACT EXTRACTION Right    COLONOSCOPY  08/17/2009   COLONOSCOPY WITH PROPOFOL N/A 03/19/2016   Procedure: COLONOSCOPY WITH PROPOFOL;  Surgeon: Robert Bellow, MD;  Location: [facility] END[facility];  Service: Endoscopy;  Laterality: N/A;   COLONOSCOPY WITH PROPOFOL N/A 05/15/2021   Procedure: COLONOSCOPY WITH PROPOFOL;  Surgeon: Robert Bellow, MD;  Location: [facility] ENDOSCOPY;  Service: Endoscopy;  Laterality: N/A;   CORNEAL TRANSPLANT Right    EYE SURGERY     JOINT REPLACEMENT     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   Procedure: TOTAL HIP ARTHROPLASTY ANTERIOR APPROACH;  Surgeon: Hessie Knows, MD;  Location: [facility] ORS;  Service: Orthopedics;  Laterality: Right;   Family History  Problem Relation Age of Onset   Cancer Mother        OVARIAN   Stroke Father    Breast cancer Sister 68   Thyroid disease Other        MULTIPLE FAMILY MEMBERS   Atrial fibrillation Brother    Social History   Socioeconomic History   Marital status: Married    Spouse name: Not on file   Number of children: Not on file   Years of education: Not on file   Highest education level: Not on file  Occupational History   Not on file  Tobacco Use   Smoking status: Former    Years: 24.00    Types: Cigarettes    Quit date: 05/09/1998    Years since quitting: 23.8   Smokeless tobacco: Never  Vaping Use   Vaping Use: Never used  Substance and Sexual Activity   Alcohol use: Yes    Alcohol/week: 0.0 standard drinks of alcohol    Comment: rarely    Drug use: No   Sexual activity: Not on file  Other Topics Concern   Not on file  Social History Narrative   Not on file   Social Determinants of Health   Financial Resource Strain: Low Risk  (03/29/2021)   Overall Financial Resource Strain (CARDIA)    Difficulty of Paying Living Expenses: Not hard at all  Food Insecurity: No Food Insecurity (03/29/2021)   Hunger Vital Sign    Worried About Running Out of Food in the Last Year: Never true     Ran Out of Food in the Last Year: Never true  Transportation Needs: No Transportation Needs (03/29/2021)   PRAPARE - Hydrologist (Medical): No    Lack of Transportation (Non-Medical): No  Physical Activity: Insufficiently Active (03/29/2021)   Exercise Vital Sign    Days of Exercise per Week: 3 days    Minutes of Exercise per Session: 30 min  Stress: No Stress Concern Present (03/29/2021)   [place]    Feeling of Stress : Not at all  Social Connections: Unknown (03/29/2021)   Social Connection and Isolation Panel [NHANES]    Frequency of Communication with Friends and Family: Not on file    Frequency of Social Gatherings with Friends and Family: Not on file    Attends Religious Services: Not on file    Active Member of Clubs or Organizations: Not on file    Attends Archivist Meetings: Not on file    Marital Status: Married  Intimate Partner Violence: Not At Risk (03/29/2021)   Humiliation, Afraid, Rape, and Kick questionnaire    Fear of Current or Ex-Partner: No    Emotionally Abused: No    Physically Abused: No    Sexually Abused: No   Current Meds  Medication Sig   azithromycin (ZITHROMAX) 250 MG tablet With food Take 2 tablets on day 1, then 1 tablet daily on days 2 through 5   fluconazole (DIFLUCAN) 150 MG tablet Take 1 tablet (150 mg total) by mouth once for 1 dose.   ondansetron (ZOFRAN) 4 MG tablet Take 1 tablet (4 mg total) by mouth every 8 (eight) hours as needed for nausea or vomiting.   ondansetron (ZOFRAN-ODT) 4 MG disintegrating tablet Take 1 tablet (4 mg total) by mouth every 8 (eight) hours as needed for nausea or vomiting.   Current Facility-Administered Medications for the 03/18/22 encounter (Office Visit) with McLean-Scocuzza, Nino Glow, MD  Medication   methylPREDNISolone acetate (DEPO-MEDROL) injection 40 mg   Allergies  Allergen Reactions   Latex  Rash   Levofloxacin     FLUOROQUINOLONES - unknown reaction   Recent Results (from the past 2160 hour(s))  TSH     Status: None   Collection Time: 02/05/22 12:36 PM  Result Value Ref Range   TSH 2.25 0.35 - 5.50 uIU/mL  Comp Met (CMET)     Status: None   Collection Time: 02/05/22 12:36 PM  Result Value Ref Range  Sodium 140 135 - 145 mEq/L   Potassium 4.0 3.5 - 5.1 mEq/L   Chloride 104 96 - 112 mEq/L   CO2 25 19 - 32 mEq/L   Glucose, Bld 79 70 - 99 mg/dL   BUN 17 6 - 23 mg/dL   Creatinine, Ser 0.77 0.40 - 1.20 mg/dL   Total Bilirubin 0.4 0.2 - 1.2 mg/dL   Alkaline Phosphatase 60 39 - 117 U/L   AST 17 0 - 37 U/L   ALT 16 0 - 35 U/L   Total Protein 7.2 6.0 - 8.3 g/dL   Albumin 4.4 3.5 - 5.2 g/dL   GFR 77.95 >60.00 mL/min    Comment: Calculated using the CKD-EPI Creatinine Equation (2021)   Calcium 9.8 8.4 - 10.5 mg/dL  Lipid Profile     Status: Abnormal   Collection Time: 02/05/22 12:36 PM  Result Value Ref Range   Cholesterol 195 0 - 200 mg/dL    Comment: ATP III Classification       Desirable:  < 200 mg/dL               Borderline High:  200 - 239 mg/dL          High:  > = 240 mg/dL   Triglycerides (H) 0.0 - 149.0 mg/dL    493.0 Triglyceride is over 400; calculations on Lipids are invalid.    Comment: Normal:  <150 mg/dLBorderline High:  150 - 199 mg/dL   HDL 37.10 (L) >39.00 mg/dL   Total CHOL/HDL Ratio 5     Comment:                Men          Women1/2 Average Risk     3.4          3.3Average Risk          5.0          4.42X Average Risk          9.6          7.13X Average Risk          15.0          11.0                      Direct LDL     Status: None   Collection Time: 02/05/22 12:36 PM  Result Value Ref Range   Direct LDL 65.0 mg/dL    Comment: Optimal:  <100 mg/dLNear or Above Optimal:  100-129 mg/dLBorderline High:  130-159 mg/dLHigh:  160-189 mg/dLVery High:  >190 mg/dL  Troponin I (High Sensitivity)     Status: None   Collection Time: 03/10/22 12:29 PM   Result Value Ref Range   Troponin I (High Sensitivity) 5 <18 ng/L    Comment: (NOTE) Elevated high sensitivity troponin I (hsTnI) values and significant  changes across serial measurements may suggest ACS but many other  chronic and acute conditions are known to elevate hsTnI results.  Refer to the "Links" section for chest pain algorithms and additional  guidance. Performed at [facility], [address] [place], [place] [address]   D-dimer, quantitative     Status: Abnormal   Collection Time: 03/10/22 12:29 PM  Result Value Ref Range   D-Dimer, Quant 0.53 (H) 0.00 - 0.50 ug/mL-FEU    Comment: (NOTE) At the manufacturer cut-off value of 0.5 g/mL FEU, this assay has a negative predictive value of 95-100%.This assay is  intended for use in conjunction with a clinical pretest probability (PTP) assessment model to exclude pulmonary embolism (PE) and deep venous thrombosis (DVT) in outpatients suspected of PE or DVT. Results should be correlated with clinical presentation. Performed at [facility], [address] [place], [place] [address]   Basic metabolic panel     Status: Abnormal   Collection Time: 03/14/22 11:15 AM  Result Value Ref Range   Sodium 138 135 - 145 mmol/L   Potassium 3.1 (L) 3.5 - 5.1 mmol/L   Chloride 104 98 - 111 mmol/L   CO2 25 22 - 32 mmol/L   Glucose, Bld 141 (H) 70 - 99 mg/dL    Comment: Glucose reference range applies only to samples taken after fasting for at least 8 hours.   BUN 23 8 - 23 mg/dL   Creatinine, Ser 0.81 0.44 - 1.00 mg/dL   Calcium 9.5 8.9 - 10.3 mg/dL   GFR, Estimated >60 >60 mL/min    Comment: (NOTE) Calculated using the CKD-EPI Creatinine Equation (2021)    Anion gap 9 5 - 15    Comment: Performed at [facility], [address] [place], [place] [address]  CBC     Status: Abnormal   Collection Time: 03/14/22 11:15 AM  Result Value Ref Range   WBC 12.5 (H) 4.0 - 10.5 K/uL   RBC 4.57 3.87 - 5.11  MIL/uL   Hemoglobin 14.2 12.0 - 15.0 g/dL   HCT 42.9 36.0 - 46.0 %   MCV 93.9 80.0 - 100.0 fL   MCH 31.1 26.0 - 34.0 pg   MCHC 33.1 30.0 - 36.0 g/dL   RDW 11.9 11.5 - 15.5 %   Platelets 281 150 - 400 K/uL   nRBC 0.0 0.0 - 0.2 %    Comment: Performed at [facility], [place], [place] [address]  Troponin I (High Sensitivity)     Status: None   Collection Time: 03/14/22 11:15 AM  Result Value Ref Range   Troponin I (High Sensitivity) 5 <18 ng/L    Comment: (NOTE) Elevated high sensitivity troponin I (hsTnI) values and significant  changes across serial measurements may suggest ACS but many other  chronic and acute conditions are known to elevate hsTnI results.  Refer to the "Links" section for chest pain algorithms and additional  guidance. Performed at [facility], [address] [place], [place] [address]    Objective  Body mass index is 32.23 kg/m. Wt Readings from Last 3 Encounters:  03/18/22 170 lb 9.6 oz (77.4 kg)  03/14/22 168 lb (76.2 kg)  02/05/22 174 lb 6.4 oz (79.1 kg)   Temp Readings from Last 3 Encounters:  03/18/22 (!) 97.5 F (36.4 C) (Oral)  03/14/22 97.6 F (36.4 C) (Oral)  02/05/22 98.4 F (36.9 C) (Oral)   BP Readings from Last 3 Encounters:  03/18/22 138/78  03/14/22 (!) 164/73  02/05/22 126/72   Pulse Readings from Last 3 Encounters:  03/18/22 87  03/14/22 90  02/05/22 90    Physical Exam Vitals and nursing note reviewed.  Constitutional:      Appearance: Normal appearance. She is well-developed and well-groomed.  HENT:     Head: Normocephalic and atraumatic.  Eyes:     Conjunctiva/sclera: Conjunctivae normal.     Pupils: Pupils are equal, round, and reactive to light.  Cardiovascular:     Rate and Rhythm: Normal rate and regular rhythm.     Heart sounds: Normal heart sounds. No  murmur heard. Pulmonary:     Effort: Pulmonary effort is normal.     Breath sounds: Normal breath sounds.  Abdominal:      General: Abdomen is flat. Bowel sounds are normal.     Tenderness: There is no abdominal tenderness.  Musculoskeletal:        General: No tenderness.  Skin:    General: Skin is warm and dry.  Neurological:     General: No focal deficit present.     Mental Status: She is alert and oriented to person, place, and time. Mental status is at baseline.     Cranial Nerves: Cranial nerves 2-12 are intact.     Motor: Motor function is intact.     Coordination: Coordination is intact.     Gait: Gait is intact.  Psychiatric:        Attention and Perception: Attention and perception normal.        Mood and Affect: Mood and affect normal.        Speech: Speech normal.        Behavior: Behavior normal. Behavior is cooperative.        Thought Content: Thought content normal.        Cognition and Memory: Cognition and memory normal.        Judgment: Judgment normal.     Assessment  Plan  History of migraine ? If nurtec needed in future with w/u migraines   Acute intractable headache, unspecified headache type - Plan: MR Brain Wo Contrast, Sedimentation rate, C-reactive protein, methylPREDNISolone acetate (DEPO-MEDROL) injection 40 mg,  Given zofran 4 mg x 1 today  MRI abnormal  IMPRESSION: 1. Negative for acute infarct. 2. Multiple skull lesions. The largest lesion is in the occipital bone, with adjacent intracranial extension of tumor. The smaller lesions are seen in the high parietal bone bilaterally. 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 3. 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 4. CT head   with contrast recommended for further evaluation. Electronically Signed   By: Franchot Gallo M.D.   On: 03/18/2022 17:19 Referred to [facility] Dr. Zada Finders in [place]  And referred to Dr. Janese Banks further w/u are bone lesions primary brain cancer vs MM vs other cancer   If worsening h/a disc with pt to [facility] [place] for care asap  Nausea - Plan: MR Brain Wo Contrast, ondansetron (ZOFRAN-ODT) 4 MG disintegrating tablet, ondansetron (ZOFRAN) 4 MG tablet x 1 in office  Sinusitis, unspecified chronicity, unspecified location - Plan: azithromycin (ZITHROMAX) 250 MG tablet  Yeast vaginitis - Plan: fluconazole (DIFLUCAN) 150 MG tablet   Hypokalemia - Plan: Basic Metabolic Panel (BMET), Magnesium High K food list  Provider: Dr. Olivia Mackie McLean-Scocuzza-Internal Medicine

## 2022-03-18 NOTE — Patient Instructions (Signed)
Magnesium 250 mg daily for headache prevention  B complex vitamins   General Headache Without Cause A headache is pain or discomfort felt around the head or neck area. There are many causes and types of headaches. A few common types include: Tension headaches. Migraine headaches. Cluster headaches. Chronic daily headaches. Sometimes, the specific cause of a headache may not be found. Follow these instructions at home: Watch your condition for any changes. Let your health care provider know about them. Take these steps to help with your condition: Managing pain     Take over-the-counter and prescription medicines only as told by your health care provider. Treatment may include medicines for pain that are taken by mouth or applied to the skin. Lie down in a dark, quiet room when you have a headache. Keep lights dim if bright lights bother you or make your headaches worse. If directed, put ice on your head and neck area: Put ice in a plastic bag. Place a towel between your skin and the bag. Leave the ice on for 20 minutes, 2-3 times per day. Remove the ice if your skin turns bright red. This is very important. If you cannot feel pain, heat, or cold, you have a greater risk of damage to the area. If directed, apply heat to the affected area. Use the heat source that your health care provider recommends, such as a moist heat pack or a heating pad. Place a towel between your skin and the heat source. Leave the heat on for 20-30 minutes. Remove the heat if your skin turns bright red. This is especially important if you are unable to feel pain, heat, or cold. You have a greater risk of getting burned. Eating and drinking Eat meals on a regular schedule. If you drink alcohol: Limit how much you have to: 0-1 drink a day for women who are not pregnant. 0-2 drinks a day for men. Know how much alcohol is in a drink. In the [place] one drink equals one 12 oz bottle of beer (355 mL), one 5 oz glass  of wine (148 mL), or one 1 oz glass of hard liquor (44 mL). Stop drinking caffeine, or decrease the amount of caffeine you drink. Drink enough fluid to keep your urine pale yellow. General instructions  Keep a headache journal to help find out what may trigger your headaches. For example, write down: What you eat and drink. How much sleep you get. Any change to your diet or medicines. Try massage or other relaxation techniques. Limit stress. Sit up straight, and do not tense your muscles. Do not use any products that contain nicotine or tobacco. These products include cigarettes, chewing tobacco, and vaping devices, such as e-cigarettes. If you need help quitting, ask your health care provider. Exercise regularly as told by your health care provider. Sleep on a regular schedule. Get 7-9 hours of sleep each night, or the amount recommended by your health care provider. Keep all follow-up visits. This is important. Contact a health care provider if: Medicine does not help your symptoms. You have a headache that is different from your usual headache. You have nausea or you vomit. You have a fever. Get help right away if: Your headache: Becomes severe quickly. Gets worse after moderate to intense physical activity. You have any of these symptoms: Repeated vomiting. Pain or stiffness in your neck. Changes to your vision. Pain in an eye or ear. Problems with speech. Muscular weakness or loss of muscle control. Loss of balance  or coordination. You feel faint or pass out. You have confusion. You have a seizure. These symptoms may represent a serious problem that is an emergency. Do not wait to see if the symptoms will go away. Get medical help right away. Call your local emergency services (911 in the [place] Do not drive yourself to the hospital. Summary A headache is pain or discomfort felt around the head or neck area. There are many causes and types of headaches. In some cases, the  cause may not be found. Keep a headache journal to help find out what may trigger your headaches. Watch your condition for any changes. Let your health care provider know about them. Contact a health care provider if you have a headache that is different from the usual headache, or if your symptoms are not helped by medicine. Get help right away if your headache becomes severe, you vomit, you have a loss of vision, you lose your balance, or you have a seizure. This information is not intended to replace advice given to you by your health care provider. Make sure you discuss any questions you have with your health care provider. Document Revised: 10/24/2020 Document Reviewed: 10/24/2020 Elsevier Patient Education  [place].

## 2022-03-19 ENCOUNTER — Other Ambulatory Visit: Payer: Self-pay | Admitting: Internal Medicine

## 2022-03-19 ENCOUNTER — Encounter (HOSPITAL_COMMUNITY): Payer: Self-pay | Admitting: Emergency Medicine

## 2022-03-19 ENCOUNTER — Other Ambulatory Visit: Payer: Self-pay

## 2022-03-19 ENCOUNTER — Inpatient Hospital Stay (HOSPITAL_COMMUNITY)
Admission: EM | Admit: 2022-03-19 | Discharge: 2022-03-27 | DRG: 166 | Disposition: A | Discharge status: Home / Self Care | Payer: PPO | Attending: Family Medicine | Admitting: Family Medicine

## 2022-03-19 ENCOUNTER — Emergency Department (HOSPITAL_COMMUNITY): Payer: PPO

## 2022-03-19 ENCOUNTER — Telehealth: Payer: Self-pay | Admitting: Internal Medicine

## 2022-03-19 DIAGNOSIS — M8458XA Pathological fracture in neoplastic disease, other specified site, initial encounter for fracture: Secondary | ICD-10-CM | POA: Diagnosis present

## 2022-03-19 DIAGNOSIS — G939 Disorder of brain, unspecified: Secondary | ICD-10-CM | POA: Diagnosis present

## 2022-03-19 DIAGNOSIS — J302 Other seasonal allergic rhinitis: Secondary | ICD-10-CM | POA: Diagnosis present

## 2022-03-19 DIAGNOSIS — Z90710 Acquired absence of both cervix and uterus: Secondary | ICD-10-CM

## 2022-03-19 DIAGNOSIS — Z79890 Hormone replacement therapy (postmenopausal): Secondary | ICD-10-CM

## 2022-03-19 DIAGNOSIS — M8448XA Pathological fracture, other site, initial encounter for fracture: Secondary | ICD-10-CM

## 2022-03-19 DIAGNOSIS — Z823 Family history of stroke: Secondary | ICD-10-CM

## 2022-03-19 DIAGNOSIS — F419 Anxiety disorder, unspecified: Secondary | ICD-10-CM | POA: Diagnosis present

## 2022-03-19 DIAGNOSIS — Z947 Corneal transplant status: Secondary | ICD-10-CM

## 2022-03-19 DIAGNOSIS — C3411 Malignant neoplasm of upper lobe, right bronchus or lung: Secondary | ICD-10-CM | POA: Diagnosis not present

## 2022-03-19 DIAGNOSIS — R9089 Other abnormal findings on diagnostic imaging of central nervous system: Secondary | ICD-10-CM

## 2022-03-19 DIAGNOSIS — Z8349 Family history of other endocrine, nutritional and metabolic diseases: Secondary | ICD-10-CM

## 2022-03-19 DIAGNOSIS — Z91040 Latex allergy status: Secondary | ICD-10-CM

## 2022-03-19 DIAGNOSIS — C3490 Malignant neoplasm of unspecified part of unspecified bronchus or lung: Secondary | ICD-10-CM

## 2022-03-19 DIAGNOSIS — F32A Depression, unspecified: Secondary | ICD-10-CM | POA: Diagnosis present

## 2022-03-19 DIAGNOSIS — C7951 Secondary malignant neoplasm of bone: Secondary | ICD-10-CM | POA: Diagnosis present

## 2022-03-19 DIAGNOSIS — Z803 Family history of malignant neoplasm of breast: Secondary | ICD-10-CM

## 2022-03-19 DIAGNOSIS — R112 Nausea with vomiting, unspecified: Secondary | ICD-10-CM

## 2022-03-19 DIAGNOSIS — E039 Hypothyroidism, unspecified: Secondary | ICD-10-CM | POA: Diagnosis present

## 2022-03-19 DIAGNOSIS — C7970 Secondary malignant neoplasm of unspecified adrenal gland: Secondary | ICD-10-CM | POA: Diagnosis present

## 2022-03-19 DIAGNOSIS — K5909 Other constipation: Secondary | ICD-10-CM

## 2022-03-19 DIAGNOSIS — R110 Nausea: Secondary | ICD-10-CM | POA: Diagnosis not present

## 2022-03-19 DIAGNOSIS — Z8041 Family history of malignant neoplasm of ovary: Secondary | ICD-10-CM

## 2022-03-19 DIAGNOSIS — C799 Secondary malignant neoplasm of unspecified site: Secondary | ICD-10-CM

## 2022-03-19 DIAGNOSIS — E860 Dehydration: Secondary | ICD-10-CM | POA: Diagnosis present

## 2022-03-19 DIAGNOSIS — G9389 Other specified disorders of brain: Secondary | ICD-10-CM

## 2022-03-19 DIAGNOSIS — M1990 Unspecified osteoarthritis, unspecified site: Secondary | ICD-10-CM | POA: Diagnosis present

## 2022-03-19 DIAGNOSIS — Z6832 Body mass index (BMI) 32.0-32.9, adult: Secondary | ICD-10-CM

## 2022-03-19 DIAGNOSIS — E785 Hyperlipidemia, unspecified: Secondary | ICD-10-CM | POA: Diagnosis present

## 2022-03-19 DIAGNOSIS — X58XXXA Exposure to other specified factors, initial encounter: Secondary | ICD-10-CM | POA: Diagnosis present

## 2022-03-19 DIAGNOSIS — Z87891 Personal history of nicotine dependence: Secondary | ICD-10-CM

## 2022-03-19 DIAGNOSIS — Z888 Allergy status to other drugs, medicaments and biological substances status: Secondary | ICD-10-CM

## 2022-03-19 DIAGNOSIS — J9601 Acute respiratory failure with hypoxia: Secondary | ICD-10-CM | POA: Diagnosis not present

## 2022-03-19 DIAGNOSIS — R918 Other nonspecific abnormal finding of lung field: Secondary | ICD-10-CM

## 2022-03-19 DIAGNOSIS — S2231XA Fracture of one rib, right side, initial encounter for closed fracture: Secondary | ICD-10-CM | POA: Diagnosis present

## 2022-03-19 DIAGNOSIS — E669 Obesity, unspecified: Secondary | ICD-10-CM | POA: Diagnosis present

## 2022-03-19 DIAGNOSIS — M898X8 Other specified disorders of bone, other site: Principal | ICD-10-CM

## 2022-03-19 DIAGNOSIS — E049 Nontoxic goiter, unspecified: Secondary | ICD-10-CM | POA: Diagnosis present

## 2022-03-19 DIAGNOSIS — Z96641 Presence of right artificial hip joint: Secondary | ICD-10-CM | POA: Diagnosis present

## 2022-03-19 DIAGNOSIS — Z79899 Other long term (current) drug therapy: Secondary | ICD-10-CM

## 2022-03-19 LAB — COMPREHENSIVE METABOLIC PANEL
ALT: 87 U/L — ABNORMAL HIGH (ref 0–44)
AST: 83 U/L — ABNORMAL HIGH (ref 15–41)
Albumin: 3.9 g/dL (ref 3.5–5.0)
Alkaline Phosphatase: 56 U/L (ref 38–126)
Anion gap: 12 (ref 5–15)
BUN: 16 mg/dL (ref 8–23)
CO2: 26 mmol/L (ref 22–32)
Calcium: 10.2 mg/dL (ref 8.9–10.3)
Chloride: 100 mmol/L (ref 98–111)
Creatinine, Ser: 0.88 mg/dL (ref 0.44–1.00)
GFR, Estimated: >60 mL/min (ref >60)
Glucose, Bld: 129 mg/dL — ABNORMAL HIGH (ref 70–99)
Potassium: 3.6 mmol/L (ref 3.5–5.1)
Sodium: 138 mmol/L (ref 135–145)
Total Bilirubin: 0.5 mg/dL (ref 0.3–1.2)
Total Protein: 7.2 g/dL (ref 6.5–8.1)

## 2022-03-19 LAB — CBC WITH DIFFERENTIAL/PLATELET
Abs Immature Granulocytes: 0.04 10*3/uL (ref 0.00–0.07)
Basophils Absolute: 0 10*3/uL (ref 0.0–0.1)
Basophils Relative: 0 %
Eosinophils Absolute: 0 10*3/uL (ref 0.0–0.5)
Eosinophils Relative: 0 %
HCT: 45.2 % (ref 36.0–46.0)
Hemoglobin: 15.3 g/dL — ABNORMAL HIGH (ref 12.0–15.0)
Immature Granulocytes: 0 %
Lymphocytes Relative: 11 %
Lymphs Abs: 1.3 10*3/uL (ref 0.7–4.0)
MCH: 31.6 pg (ref 26.0–34.0)
MCHC: 33.8 g/dL (ref 30.0–36.0)
MCV: 93.4 fL (ref 80.0–100.0)
Monocytes Absolute: 0.8 10*3/uL (ref 0.1–1.0)
Monocytes Relative: 7 %
Neutro Abs: 9.6 10*3/uL — ABNORMAL HIGH (ref 1.7–7.7)
Neutrophils Relative %: 82 %
Platelets: 306 10*3/uL (ref 150–400)
RBC: 4.84 MIL/uL (ref 3.87–5.11)
RDW: 11.6 % (ref 11.5–15.5)
WBC: 11.8 10*3/uL — ABNORMAL HIGH (ref 4.0–10.5)
nRBC: 0 % (ref 0.0–0.2)

## 2022-03-19 LAB — BASIC METABOLIC PANEL
BUN: 18 mg/dL (ref 6–23)
CO2: 28 mEq/L (ref 19–32)
Calcium: 9.8 mg/dL (ref 8.4–10.5)
Chloride: 99 mEq/L (ref 96–112)
Creatinine, Ser: 0.76 mg/dL (ref 0.40–1.20)
GFR: 79.11 mL/min (ref >60.00)
Glucose, Bld: 135 mg/dL — ABNORMAL HIGH (ref 70–99)
Potassium: 3.8 mEq/L (ref 3.5–5.1)
Sodium: 137 mEq/L (ref 135–145)

## 2022-03-19 LAB — SEDIMENTATION RATE: Sed Rate: 26 mm/hr (ref 0–30)

## 2022-03-19 LAB — C-REACTIVE PROTEIN: CRP: 1.2 mg/dL (ref 0.5–20.0)

## 2022-03-19 LAB — MAGNESIUM
Magnesium: 2 mg/dL (ref 1.5–2.5)
Magnesium: 2.1 mg/dL (ref 1.7–2.4)

## 2022-03-19 MED ORDER — IOHEXOL 350 MG/ML SOLN
75.0000 mL | Freq: Once | INTRAVENOUS | Status: AC | PRN
  Administered 2022-03-19: 75 mL via INTRAVENOUS

## 2022-03-19 MED ORDER — PROMETHAZINE HCL 12.5 MG PO TABS: 12.5000 mg | ORAL_TABLET | Freq: Two times a day (BID) | ORAL | 0 refills | Status: DC | PRN

## 2022-03-19 MED ORDER — PROMETHAZINE HCL 25 MG PO TABS
25.0000 mg | ORAL_TABLET | Freq: Once | ORAL | Status: AC
  Administered 2022-03-19: 25 mg via ORAL
  Filled 2022-03-19: qty 1

## 2022-03-19 NOTE — Telephone Encounter (Signed)
Per daughter Caryl Pina nausea worse zofran not working  Rec go to ED at [facility] [employer]

## 2022-03-19 NOTE — Telephone Encounter (Signed)
Per daughter nausea worse zofran not working  Rec go to ED at [facility] add [employer]

## 2022-03-19 NOTE — ED Triage Notes (Signed)
Pt reported to ED with nausea and inability to tolerate food. Pt states she has a brain lesion and was told by doctor to come to ED to receive neurosurgery consult. Pt states she is having severe headaches. Has oncology appointment Friday 03/21/2022.

## 2022-03-19 NOTE — ED Provider Triage Note (Signed)
Emergency Medicine Provider Triage Evaluation Note  Lorraine Mills , a 71 y.o. female  was evaluated in triage.  Pt complains of headache, nausea and vomiting.  Patient recently had MRI with her PCP yesterday for headaches that show concern for new brain mass with occipital swelling.  Patient reports ongoing nausea and vomiting for 3 days.  Zofran not helping.  Denies any vision changes.  Reports ongoing headache.  No history of cancer..  Review of Systems  Positive: Headache, nausea, vomiting Negative: Vision changes  Physical Exam  BP (!) 152/96 (BP Location: Left Arm)   Pulse 100   Temp 98 F (36.7 C)   Resp 17   SpO2 95%  Gen:   Awake, no distress   Resp:  Normal effort  MSK:   Moves extremities without difficulty  Other:  The patient is alert, attentive, and oriented x 3. Speech is clear. Cranial nerve II-VII grossly intact. Negative pronator drift. Sensation intact. Strength 5/5 in all extremities.  Rapid alternating movement and fine finger movements intact.   Heart regular rate and rhythm.  Lungs clear to auscultation bilaterally.   Medical Decision Making  Medically screening exam initiated at 7:46 PM.  Appropriate orders placed.  [name] was informed that the remainder of the evaluation will be completed by another provider, this initial triage assessment does not replace that evaluation, and the importance of remaining in the ED until their evaluation is complete.  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  Recommend CT imaging with contrast by PCP.  Patient has tried antiemetics at home without any relief of symptoms.  Normal neurological exam.  Labs imaging pending at this time.   Doristine Devoid, PA-C 03/19/22 1949

## 2022-03-19 NOTE — Telephone Encounter (Signed)
Pt daughter would like to be called in regards to the pt symptoms. Pt saw [facility] on yesterday for these symptoms

## 2022-03-20 ENCOUNTER — Emergency Department (HOSPITAL_COMMUNITY): Payer: PPO

## 2022-03-20 ENCOUNTER — Encounter (HOSPITAL_COMMUNITY): Payer: Self-pay | Admitting: Internal Medicine

## 2022-03-20 DIAGNOSIS — Z96641 Presence of right artificial hip joint: Secondary | ICD-10-CM | POA: Diagnosis present

## 2022-03-20 DIAGNOSIS — E860 Dehydration: Secondary | ICD-10-CM | POA: Diagnosis present

## 2022-03-20 DIAGNOSIS — S2231XA Fracture of one rib, right side, initial encounter for closed fracture: Secondary | ICD-10-CM | POA: Diagnosis present

## 2022-03-20 DIAGNOSIS — G939 Disorder of brain, unspecified: Secondary | ICD-10-CM | POA: Diagnosis present

## 2022-03-20 DIAGNOSIS — C799 Secondary malignant neoplasm of unspecified site: Secondary | ICD-10-CM | POA: Diagnosis not present

## 2022-03-20 DIAGNOSIS — F419 Anxiety disorder, unspecified: Secondary | ICD-10-CM | POA: Diagnosis present

## 2022-03-20 DIAGNOSIS — M1990 Unspecified osteoarthritis, unspecified site: Secondary | ICD-10-CM | POA: Diagnosis present

## 2022-03-20 DIAGNOSIS — C3491 Malignant neoplasm of unspecified part of right bronchus or lung: Secondary | ICD-10-CM

## 2022-03-20 DIAGNOSIS — C3490 Malignant neoplasm of unspecified part of unspecified bronchus or lung: Secondary | ICD-10-CM

## 2022-03-20 DIAGNOSIS — M8448XA Pathological fracture, other site, initial encounter for fracture: Secondary | ICD-10-CM | POA: Diagnosis not present

## 2022-03-20 DIAGNOSIS — Z91040 Latex allergy status: Secondary | ICD-10-CM | POA: Diagnosis not present

## 2022-03-20 DIAGNOSIS — X58XXXA Exposure to other specified factors, initial encounter: Secondary | ICD-10-CM | POA: Diagnosis present

## 2022-03-20 DIAGNOSIS — E669 Obesity, unspecified: Secondary | ICD-10-CM | POA: Diagnosis present

## 2022-03-20 DIAGNOSIS — R590 Localized enlarged lymph nodes: Secondary | ICD-10-CM | POA: Diagnosis not present

## 2022-03-20 DIAGNOSIS — C3411 Malignant neoplasm of upper lobe, right bronchus or lung: Secondary | ICD-10-CM | POA: Diagnosis present

## 2022-03-20 DIAGNOSIS — E039 Hypothyroidism, unspecified: Secondary | ICD-10-CM | POA: Diagnosis present

## 2022-03-20 DIAGNOSIS — R110 Nausea: Secondary | ICD-10-CM | POA: Diagnosis present

## 2022-03-20 DIAGNOSIS — K5909 Other constipation: Secondary | ICD-10-CM | POA: Diagnosis not present

## 2022-03-20 DIAGNOSIS — E785 Hyperlipidemia, unspecified: Secondary | ICD-10-CM

## 2022-03-20 DIAGNOSIS — J9601 Acute respiratory failure with hypoxia: Secondary | ICD-10-CM | POA: Diagnosis not present

## 2022-03-20 DIAGNOSIS — Z87891 Personal history of nicotine dependence: Secondary | ICD-10-CM | POA: Diagnosis not present

## 2022-03-20 DIAGNOSIS — Z8041 Family history of malignant neoplasm of ovary: Secondary | ICD-10-CM | POA: Diagnosis not present

## 2022-03-20 DIAGNOSIS — Z79899 Other long term (current) drug therapy: Secondary | ICD-10-CM | POA: Diagnosis not present

## 2022-03-20 DIAGNOSIS — R112 Nausea with vomiting, unspecified: Secondary | ICD-10-CM | POA: Diagnosis present

## 2022-03-20 DIAGNOSIS — F32A Depression, unspecified: Secondary | ICD-10-CM | POA: Diagnosis present

## 2022-03-20 DIAGNOSIS — R9089 Other abnormal findings on diagnostic imaging of central nervous system: Secondary | ICD-10-CM | POA: Diagnosis not present

## 2022-03-20 DIAGNOSIS — Z6832 Body mass index (BMI) 32.0-32.9, adult: Secondary | ICD-10-CM | POA: Diagnosis not present

## 2022-03-20 DIAGNOSIS — M8458XA Pathological fracture in neoplastic disease, other specified site, initial encounter for fracture: Secondary | ICD-10-CM | POA: Diagnosis present

## 2022-03-20 DIAGNOSIS — C7951 Secondary malignant neoplasm of bone: Secondary | ICD-10-CM | POA: Diagnosis present

## 2022-03-20 DIAGNOSIS — M898X8 Other specified disorders of bone, other site: Secondary | ICD-10-CM | POA: Diagnosis not present

## 2022-03-20 DIAGNOSIS — J9859 Other diseases of mediastinum, not elsewhere classified: Secondary | ICD-10-CM | POA: Diagnosis not present

## 2022-03-20 DIAGNOSIS — F418 Other specified anxiety disorders: Secondary | ICD-10-CM | POA: Diagnosis not present

## 2022-03-20 DIAGNOSIS — E049 Nontoxic goiter, unspecified: Secondary | ICD-10-CM | POA: Diagnosis present

## 2022-03-20 DIAGNOSIS — C7970 Secondary malignant neoplasm of unspecified adrenal gland: Secondary | ICD-10-CM | POA: Diagnosis present

## 2022-03-20 DIAGNOSIS — J302 Other seasonal allergic rhinitis: Secondary | ICD-10-CM | POA: Diagnosis present

## 2022-03-20 MED ORDER — ONDANSETRON HCL 4 MG/2ML IJ SOLN
4.0000 mg | Freq: Four times a day (QID) | INTRAMUSCULAR | Status: DC | PRN
  Administered 2022-03-21 – 2022-03-26 (×3): 4 mg via INTRAVENOUS
  Filled 2022-03-20 (×4): qty 2

## 2022-03-20 MED ORDER — SODIUM CHLORIDE 0.9 % IV SOLN: INTRAVENOUS | Status: DC

## 2022-03-20 MED ORDER — LACTATED RINGERS IV BOLUS
1000.0000 mL | Freq: Once | INTRAVENOUS | Status: AC
  Administered 2022-03-20: 1000 mL via INTRAVENOUS

## 2022-03-20 MED ORDER — ROSUVASTATIN CALCIUM 10 MG PO TABS
10.0000 mg | ORAL_TABLET | Freq: Every day | ORAL | Status: DC
  Administered 2022-03-21 – 2022-03-27 (×6): 10 mg via ORAL
  Filled 2022-03-20 (×6): qty 1

## 2022-03-20 MED ORDER — ACETAMINOPHEN 650 MG RE SUPP: 650.0000 mg | Freq: Four times a day (QID) | RECTAL | Status: DC | PRN

## 2022-03-20 MED ORDER — LEVOTHYROXINE SODIUM 50 MCG PO TABS
75.0000 ug | ORAL_TABLET | Freq: Every day | ORAL | Status: DC
  Administered 2022-03-21 – 2022-03-27 (×7): 75 ug via ORAL
  Filled 2022-03-20 (×7): qty 1

## 2022-03-20 MED ORDER — DOCUSATE SODIUM 100 MG PO CAPS
100.0000 mg | ORAL_CAPSULE | Freq: Two times a day (BID) | ORAL | Status: DC
  Administered 2022-03-20 – 2022-03-23 (×7): 100 mg via ORAL
  Filled 2022-03-20 (×8): qty 1

## 2022-03-20 MED ORDER — HYDROMORPHONE HCL 1 MG/ML IJ SOLN
1.0000 mg | Freq: Once | INTRAMUSCULAR | Status: AC
  Administered 2022-03-20: 1 mg via INTRAMUSCULAR
  Filled 2022-03-20: qty 1

## 2022-03-20 MED ORDER — ONDANSETRON HCL 4 MG PO TABS
4.0000 mg | ORAL_TABLET | Freq: Four times a day (QID) | ORAL | Status: DC | PRN
  Administered 2022-03-24: 4 mg via ORAL
  Filled 2022-03-20: qty 1

## 2022-03-20 MED ORDER — DEXAMETHASONE SODIUM PHOSPHATE 4 MG/ML IJ SOLN
2.0000 mg | Freq: Two times a day (BID) | INTRAMUSCULAR | Status: DC
  Administered 2022-03-20 – 2022-03-27 (×15): 2 mg via INTRAVENOUS
  Filled 2022-03-20 (×15): qty 1

## 2022-03-20 MED ORDER — DEXAMETHASONE 4 MG PO TABS: 2.0000 mg | ORAL_TABLET | Freq: Two times a day (BID) | ORAL | Status: DC

## 2022-03-20 MED ORDER — CYCLOSPORINE 0.05 % OP EMUL
1.0000 [drp] | Freq: Two times a day (BID) | OPHTHALMIC | Status: DC
  Administered 2022-03-20 – 2022-03-27 (×14): 1 [drp] via OPHTHALMIC
  Filled 2022-03-20 (×14): qty 30

## 2022-03-20 MED ORDER — IOHEXOL 350 MG/ML SOLN
75.0000 mL | Freq: Once | INTRAVENOUS | Status: AC | PRN
  Administered 2022-03-20: 75 mL via INTRAVENOUS

## 2022-03-20 MED ORDER — HYDRALAZINE HCL 20 MG/ML IJ SOLN: 5.0000 mg | INTRAMUSCULAR | Status: DC | PRN

## 2022-03-20 MED ORDER — TRAZODONE HCL 50 MG PO TABS
50.0000 mg | ORAL_TABLET | Freq: Every evening | ORAL | Status: DC | PRN
  Administered 2022-03-22 – 2022-03-25 (×3): 50 mg via ORAL
  Filled 2022-03-20 (×3): qty 1

## 2022-03-20 MED ORDER — ONDANSETRON HCL 4 MG/2ML IJ SOLN
4.0000 mg | Freq: Once | INTRAMUSCULAR | Status: AC
  Administered 2022-03-20: 4 mg via INTRAVENOUS
  Filled 2022-03-20: qty 2

## 2022-03-20 MED ORDER — POLYETHYLENE GLYCOL 3350 17 G PO PACK: 17.0000 g | PACK | Freq: Every day | ORAL | Status: DC | PRN

## 2022-03-20 MED ORDER — PANTOPRAZOLE SODIUM 40 MG PO TBEC
40.0000 mg | DELAYED_RELEASE_TABLET | Freq: Every day | ORAL | Status: DC
  Administered 2022-03-21 – 2022-03-27 (×6): 40 mg via ORAL
  Filled 2022-03-20 (×6): qty 1

## 2022-03-20 MED ORDER — METOCLOPRAMIDE HCL 5 MG/ML IJ SOLN
10.0000 mg | Freq: Once | INTRAMUSCULAR | Status: AC
  Administered 2022-03-20: 10 mg via INTRAVENOUS
  Filled 2022-03-20: qty 2

## 2022-03-20 MED ORDER — BISACODYL 5 MG PO TBEC
5.0000 mg | DELAYED_RELEASE_TABLET | Freq: Every day | ORAL | Status: DC | PRN
  Administered 2022-03-24: 5 mg via ORAL
  Filled 2022-03-20: qty 1

## 2022-03-20 MED ORDER — SODIUM CHLORIDE 0.9 % IV SOLN
12.5000 mg | Freq: Once | INTRAVENOUS | Status: AC
  Administered 2022-03-20: 12.5 mg via INTRAVENOUS
  Filled 2022-03-20: qty 12.5

## 2022-03-20 MED ORDER — PREDNISOLONE ACETATE 1 % OP SUSP
1.0000 [drp] | Freq: Every day | OPHTHALMIC | Status: DC
  Administered 2022-03-21 – 2022-03-27 (×7): 1 [drp] via OPHTHALMIC
  Filled 2022-03-20: qty 5

## 2022-03-20 MED ORDER — ACETAMINOPHEN 325 MG PO TABS
650.0000 mg | ORAL_TABLET | Freq: Four times a day (QID) | ORAL | Status: DC | PRN
  Administered 2022-03-21 – 2022-03-27 (×3): 650 mg via ORAL
  Filled 2022-03-20 (×3): qty 2

## 2022-03-20 NOTE — H&P (Signed)
History and Physical    Patient: Lorraine Mills:[id number] DOB: 03-06-1951 DOA: 03/19/2022 DOS: the patient was seen and examined on 03/20/2022 PCP: [name], MD  Patient coming from: Home - lives with husband; NOK: Husband, [phone]   Chief Complaint: headache  HPI: Lorraine Mills is a 71 y.o. female with medical history significant of HLD, hypothyroidism, and depression/anxiety presenting with headache and n/v in the setting of a recently diagnosed brain mass.  "I have lung cancer."  She noticed about a month ago a headache.  She noticed a knot in her skull and then noticed some pain in her chest and SOB maybe 1-2 weeks later and was getting worse.  She went to the walk-in clinic and had a chest pain and she had "air bags" in the bottoms of her lungs.  Last Thursday, the pain was unbearable and she went to the ER at [facility].  She had an MRI and she had lesions on her skull.  She went to her doctor on Tuesday and she ordered a brain MRI and the headaches have gotten worse.  Yesterday, she was having n/v and they finally decided to come to the ER.  She smoked 1ppd for about 24 years.  Some unintentional weight loss.  +night sweats for years, unchanged.  No cough.  SOB comes and goes, sometimes exertional.  No orthopnea.  No PND.  She was told that she had costochondritis causing her R rib pain.  Mother died of ovarian cancer 08/19/08), sister had breast cancer 2003/08/20).      ER Course:  Headaches.  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  Referred for outpatient neurosurg/onc.  Felt awful so came to ER.  CT C/A/P likely lung cancer with diffuse mets.  Neurosurgery does not need involvement.  Onc Alvy Bimler) recommends admission to [facility].     Review of Systems: As mentioned in the history of present illness. All other systems reviewed and are negative. Past Medical History:  Diagnosis Date   Anxiety    Arthritis    Depression 01/31/2016   Goiter    Hyperlipidemia    TGs   Hypothyroidism     Seasonal allergies    Past Surgical History:  Procedure Laterality Date   ABDOMINAL HYSTERECTOMY     supracervical    BILATERAL OOPHORECTOMY  08-20-1999   BENIGN TUMOR   BREAST BIOPSY Left 1985   cyst    CATARACT EXTRACTION Right    COLONOSCOPY  08/17/2009   COLONOSCOPY WITH PROPOFOL N/A 03/19/2016   Procedure: COLONOSCOPY WITH PROPOFOL;  Surgeon: Robert Bellow, MD;  Location: [facility] ENDOSCOPY;  Service: Endoscopy;  Laterality: N/A;   COLONOSCOPY WITH PROPOFOL N/A 05/15/2021   Procedure: COLONOSCOPY WITH PROPOFOL;  Surgeon: Robert Bellow, MD;  Location: [facility] ENDOSCOPY;  Service: Endoscopy;  Laterality: N/A;   CORNEAL TRANSPLANT Right    EYE SURGERY     JOINT REPLACEMENT     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   Procedure: TOTAL HIP ARTHROPLASTY ANTERIOR APPROACH;  Surgeon: Hessie Knows, MD;  Location: [facility] ORS;  Service: Orthopedics;  Laterality: Right;   Social History:  reports that she quit smoking about 23 years ago. Her smoking use included cigarettes. She has a 24.00 pack-year smoking history. She has never used smokeless tobacco. She reports that she does not currently use alcohol. She reports that she does not use drugs.  Allergies  Allergen Reactions   Latex Rash   Levofloxacin     FLUOROQUINOLONES - unknown reaction  Family History  Problem Relation Age of Onset   Cancer Mother        OVARIAN   Stroke Father    Cancer Sister        breast   Breast cancer Sister 65   Atrial fibrillation Brother    Thyroid disease Other        MULTIPLE FAMILY MEMBERS    Prior to Admission medications   Medication Sig Start Date End Date Taking? Authorizing Provider  acetaminophen (TYLENOL) 500 MG tablet Take 1,000 mg by mouth every 6 (six) hours as needed for moderate pain.   Yes [provider]  azithromycin (ZITHROMAX) 250 MG tablet With food Take 2 tablets on day 1, then 1 tablet daily on days 2 through 5 Patient taking differently: Take 250 mg by mouth See  admin instructions. With food Take 2 tablets on day 1, then 1 tablet daily on days 2 through 5 03/18/22 03/23/22 Yes McLean-Scocuzza, Nino Glow, MD  cetirizine (ZYRTEC) 10 MG tablet Take 10 mg by mouth daily as needed for allergies.   Yes [provider]  fenofibrate micronized (LOFIBRA) 134 MG capsule Take 1 capsule (134 mg total) by mouth daily before breakfast. 02/06/22 02/01/23 Yes [name], MD  fluticasone (FLONASE) 50 MCG/ACT nasal spray Place 1-2 sprays into both nostrils daily as needed for allergies or rhinitis.   Yes [provider]  levothyroxine (SYNTHROID) 75 MCG tablet TAKE 1 TABLET EVERY DAY ON EMPTY STOMACHWITH A GLASS OF WATER AT LEAST 30-60 Odem BREAKFAST Patient taking differently: Take 75 mcg by mouth daily before breakfast. On an empty stomach with a glass of water at least 30-60 minutes before breakfast. 12/22/21  Yes Dutch Quint B, FNP  omeprazole (PRILOSEC) 20 MG capsule TAKE 1 CAPSULE BY MOUTH DAILY Patient taking differently: Take 20 mg by mouth daily. 01/28/21  Yes [name], MD  ondansetron (ZOFRAN-ODT) 4 MG disintegrating tablet Take 1 tablet (4 mg total) by mouth every 8 (eight) hours as needed for nausea or vomiting. 03/18/22  Yes McLean-Scocuzza, Nino Glow, MD  prednisoLONE acetate (PRED FORTE) 1 % ophthalmic suspension Place 1 drop into the right eye daily. 07/03/20  Yes [provider]  promethazine (PHENERGAN) 12.5 MG tablet Take 1 tablet (12.5 mg total) by mouth 2 (two) times daily as needed for nausea or vomiting. 03/19/22  Yes McLean-Scocuzza, Nino Glow, MD  RESTASIS 0.05 % ophthalmic emulsion Place 1 drop into both eyes 2 (two) times daily. 09/30/21  Yes [provider]  rosuvastatin (CRESTOR) 10 MG tablet TAKE ONE TABLET EVERY DAY. Patient taking differently: Take 10 mg by mouth daily. 03/06/22  Yes [name], MD  traZODone (DESYREL) 50 MG tablet TAKE ONE TABLET AT BEDTIME AS NEEDED FORSLEEP. Patient taking  differently: Take 50 mg by mouth at bedtime as needed for sleep. 01/03/22  Yes [name], MD    Physical Exam: Vitals:   03/20/22 1352 03/20/22 1400 03/20/22 1445 03/20/22 1633  BP:  (!) 147/75  (!) 151/72  Pulse:  72 75 77  Resp:  (!) 24 16 18   Temp:    97.9 F (36.6 C)  TempSrc:    Oral  SpO2:  99% 96% 96%  Weight: 77 kg     Height: 5\' 1"  (1.549 m)      General:  Appears ill and uncomfortable but is in NAD Eyes:  PERRL, EOMI, normal lids, iris ENT:  grossly normal hearing, lips & tongue, mmm Neck:  no  LAD, masses or thyromegaly Cardiovascular:  RRR, no m/r/g. No LE edema.  Respiratory:   CTA bilaterally with no wheezes/rales/rhonchi.  Normal respiratory effort. Abdomen:  soft, NT, ND Skin:  no rash or induration seen on limited exam Musculoskeletal:  grossly normal tone BUE/BLE, good ROM, no bony abnormality Psychiatric:  blunted mood and affect, speech fluent and appropriate, AOx3 Neurologic:  CN 2-12 grossly intact, moves all extremities in coordinated fashion   Radiological Exams on Admission: Independently reviewed - see discussion in A/P where applicable  CT CHEST ABDOMEN PELVIS W CONTRAST  Result Date: 03/20/2022 CLINICAL DATA:  Patient presents with multiple calvarial lesions concerning for metastatic disease. EXAM: CT CHEST, ABDOMEN, AND PELVIS WITH CONTRAST TECHNIQUE: Multidetector CT imaging of the chest, abdomen and pelvis was performed following the standard protocol during bolus administration of intravenous contrast. RADIATION DOSE REDUCTION: This exam was performed according to the departmental dose-optimization program which includes automated exposure control, adjustment of the mA and/or kV according to patient size and/or use of iterative reconstruction technique. CONTRAST:  36mL OMNIPAQUE IOHEXOL 350 MG/ML SOLN COMPARISON:  None Available. FINDINGS: CT CHEST FINDINGS Cardiovascular: Heart size appears normal. No significant pericardial effusion identified.  Mediastinum/Nodes: Thyroid gland, trachea and esophagus are unremarkable. Multiple enlarged right mediastinal, multiple enlarged thoracic lymph nodes are identified which are predominantly right-sided. These include: -right internal mammary lymph node measures 1.1 cm, image 27/4. -right paratracheal lymph node measures 2.3 cm, image 24/4. -subcarinal lymph node measures 1.9 cm, image 30/4. -right hilar node measures 1.7 cm, image 27/4. Lungs/Pleura: Small right pleural effusion identified, image 32/4. Subsegmental atelectasis is identified within the basilar right middle lobe and posterior right lower lobe. Bilateral pulmonary nodules are identified, including: -dominant spiculated nodule within the periphery of the right upper lobe measures 0.9 cm , image 59/5. -Nodule within the anteromedial right upper lobe measures 0.4 cm, image 65/5. Perifissural nodule along the minor fissure measures 3 mm, image 64/5. -anteromedial left upper lobe nodule measures 4 mm, image 63/5. Musculoskeletal: Multifocal lucent bone metastases are noted. -Within the left side of the T11 vertebra there is a lucent bone lesion measuring 1.6 cm, image 106/6. -Within the left lateral T7 vertebra there is a lucent bone lesion measuring 1.1 cm, image 79/5. -Lytic lesion with pathologic fracture involving the lateral aspect of the right fourth rib is identified, image 60/5. CT ABDOMEN PELVIS FINDINGS Hepatobiliary: No focal liver abnormality is seen. No gallstones, gallbladder wall thickening, or biliary dilatation. Pancreas: Unremarkable. No pancreatic ductal dilatation or surrounding inflammatory changes. Spleen: Normal in size without focal abnormality. Adrenals/Urinary Tract: Right adrenal mass measures 3.6 x 2.0 cm, image 52/4. Normal left adrenal gland. No kidney mass or hydronephrosis identified. Urinary bladder is unremarkable. Stomach/Bowel: Stomach appears normal. The appendix is visualized and is within normal limits. No bowel wall  thickening, inflammation, or distension. Vascular/Lymphatic: No significant vascular findings are present. No enlarged abdominal or pelvic lymph nodes. Reproductive: Uterus and bilateral adnexa are unremarkable. Other: No ascites or fluid collection. No peritoneal nodularity identified. Musculoskeletal: Lucent bone lesion within the left iliac wing measures 1.3 cm, image 96/4. Lucent lesion within the right iliac bone adjacent to the SI joint measures 1.1 cm, image 91/4. Status post right hip arthroplasty. IMPRESSION: 1. Enlarged right-sided mediastinal, hilar and subcarinal lymph nodes are identified compatible with metastatic adenopathy. 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 2. Multiple small lung nodules are identified bilaterally. The dominant nodule is in the periphery of the right upper lobe. 3.  Right adrenal mass, suspicious for metastatic disease. 4. 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 Pathologic fracture involving the lateral aspect of the right fourth rib. 5. Small right pleural effusion with subsegmental atelectasis within the basilar right middle lobe and posterior right lower lobe. Electronically Signed   By: Kerby Moors M.D.   On: 03/20/2022 10:28   CT Head W or Wo Contrast  Result Date: 03/19/2022 CLINICAL DATA:  Intracranial metastatic disease EXAM: CT HEAD WITHOUT AND WITH CONTRAST TECHNIQUE: Contiguous axial images were obtained from the base of the skull through the vertex without and with intravenous contrast. RADIATION DOSE REDUCTION: This exam was performed according to the departmental dose-optimization program which includes automated exposure control, adjustment of the mA and/or kV according to patient size and/or use of iterative reconstruction technique. CONTRAST:  22mL OMNIPAQUE IOHEXOL 350 MG/ML SOLN COMPARISON:  Brain MRI 03/18/2022 FINDINGS: Brain: There is a posterior  paramedian extra-axial mass near the torcula herophili with edema of the adjacent left occipital lobe. The intracranial portion of the mass measures 1.6 x 0.9 cm. There are no enhancing intraparenchymal lesions. Vascular: Filling defect at the torcula herophili extending partially into the superior sagittal sinus. Skull: Moth-eaten appearance of the occipital bone at the site of the above-described mass. The mass extends to the extracranial space with a dome-shaped lesion measuring 1.2 x 0.6 cm. Sinuses/Orbits: No fluid levels or advanced mucosal thickening of the visualized paranasal sinuses. No mastoid or middle ear effusion. The orbits are normal. IMPRESSION: 1. 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 2. Edema of the left occipital lobe, adjacent to the above-described mass. 3. Filling defect at the torcula herophili extending partially into the superior sagittal sinus, concerning for tumor invasion. Electronically Signed   By: Ulyses Jarred M.D.   On: 03/19/2022 23:30    EKG: not done   Labs on Admission: I have personally reviewed the available labs and imaging studies at the time of the admission.  Pertinent labs:    Glucose 129 AST 83/ALT 87 CRP 1.2 WBC 11.8   Assessment and Plan: Principal Problem:   Metastatic primary lung cancer (HCC) Active Problems:   Hypothyroidism   Dyslipidemia (high LDL; low HDL)   Obesity (BMI 30.0-34.9)   Multiple lesions of metastatic malignancy ([place])    Metastatic lung cancer -Patient with recent headache evaluation demonstrating skull lesions -Now here with n/v and persistent headache -Unfortunately, further evaluation appears to indicate widely metastatic disease likely from a primary R bronchogenic CA -She has a pathologic rib fracture that appears to have caused her chest wall pain -She also has iliac lesions and acknowledges some low back  pain -Will [facility] to [facility] -Her current most significant concerns are light-headedness and headache -Will treat with pain medications, nausea meds -Oncology consult - she appears to have some potential for palliative radiation, possibly also palliative chemo -She likely needs a biopsy for diagnosis -Started on Decadron -Pulm and neuro-onc also likely need to be involved -She may benefit from palliative care involvement in the near future  HLD -Continue rosuvastatin -Hold Lofibra due to limited inpatient utility  Hypothyroidism -Continue Synthroid  Depression/anxiety -She reports none in several years -She may benefit from SSRI treatment as she begins to recognize the severity of her condition -Continue trazodone  Obesity -Body mass index is 32.07 kg/m..  -Weight loss should be encouraged -Outpatient PCP/bariatric medicine f/u encouraged  Advance Care Planning:   Code Status: Full Code   Consults: Oncology; Neuro-oncology, pulm, rad onc all consulted by oncology; nutrition; TOC team; PT/OT  DVT Prophylaxis: SCDs  Family Communication: Son was present throughout evaluation  Severity of Illness: The appropriate patient status for this patient is INPATIENT. Inpatient status is judged to be reasonable and necessary in order to provide the required intensity of service to ensure the patient's safety. The patient's presenting symptoms, physical exam findings, and initial radiographic and laboratory data in the context of their chronic comorbidities is felt to place them at high risk for further clinical deterioration. Furthermore, it is not anticipated that the patient will be medically stable for discharge from the hospital within 2 midnights of admission.   * I certify that at the point of admission it is my clinical judgment that the patient will require inpatient hospital care spanning beyond 2 midnights from the point of admission due to high intensity of service, high risk for  further deterioration and high frequency of surveillance required.*  Author: Karmen Bongo, MD 03/20/2022 7:45 PM  For on call review [email or web address].

## 2022-03-20 NOTE — ED Notes (Signed)
ED TO INPATIENT HANDOFF REPORT  ED Nurse Name and Phone #: [phone] Name/Age/Gender Lorraine Mills 71 y.o. female Room/Bed: 043C/043C  Code Status   Code Status: Prior  Home/SNF/Other Home Patient oriented to: self, place, time, and situation Is this baseline? Yes   Triage Complete: Triage complete  Chief Complaint Metastatic primary lung cancer [facility]) [C34.90]  Triage Note Pt reported to ED with nausea and inability to tolerate food. Pt states she has a brain lesion and was told by doctor to come to ED to receive neurosurgery consult. Pt states she is having severe headaches. Has oncology appointment Friday 03/21/2022.   Allergies Allergies  Allergen Reactions   Latex Rash   Levofloxacin     FLUOROQUINOLONES - unknown reaction    Level of Care/Admitting Diagnosis ED Disposition     ED Disposition  Admit   Condition  --   Comment  Hospital Area: [place] [[id number]]  Level of Care: Med-Surg [16]  May admit patient to [facility] or [facility] if equivalent level of care is available:: No  Covid Evaluation: Asymptomatic - no recent exposure (last 10 days) testing not required  Diagnosis: Metastatic primary lung cancer [facility]) [[id number]]  Admitting Physician: Karmen Bongo [[id number]]  Attending Physician: Karmen Bongo [[id number]]  Certification:: I certify this patient will need inpatient services for at least 2 midnights  Estimated Length of Stay: 5          B Medical/Surgery History Past Medical History:  Diagnosis Date   Anxiety    Arthritis    Depression 01/31/2016   Goiter    Hyperlipidemia    TGs   Hypothyroidism    Seasonal allergies    Past Surgical History:  Procedure Laterality Date   ABDOMINAL HYSTERECTOMY     supracervical    BILATERAL OOPHORECTOMY  2001   BENIGN TUMOR   BREAST BIOPSY Left 1985   cyst    CATARACT EXTRACTION Right    COLONOSCOPY  08/17/2009   COLONOSCOPY WITH PROPOFOL N/A 03/19/2016   Procedure:  COLONOSCOPY WITH PROPOFOL;  Surgeon: Robert Bellow, MD;  Location: [facility] ENDOSCOPY;  Service: Endoscopy;  Laterality: N/A;   COLONOSCOPY WITH PROPOFOL N/A 05/15/2021   Procedure: COLONOSCOPY WITH PROPOFOL;  Surgeon: Robert Bellow, MD;  Location: [facility] ENDOSCOPY;  Service: Endoscopy;  Laterality: N/A;   CORNEAL TRANSPLANT Right    EYE SURGERY     JOINT REPLACEMENT     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   Procedure: TOTAL HIP ARTHROPLASTY ANTERIOR APPROACH;  Surgeon: Hessie Knows, MD;  Location: [facility] ORS;  Service: Orthopedics;  Laterality: Right;     A IV Location/Drains/Wounds Patient Lines/Drains/Airways Status     Active Line/Drains/Airways     Name Placement date Placement time Site Days   Peripheral IV 03/19/22 20 G Right Antecubital 03/19/22  2152  Antecubital  1   Negative Pressure Wound Therapy Hip Right 11/01/20  1050  --  504   Incision (Closed) 11/01/20 Hip Right 11/01/20  1049  -- 504            Intake/Output Last 24 hours  Intake/Output Summary (Last 24 hours) at 03/20/2022 1443 Last data filed at 03/20/2022 1439 Gross per 24 hour  Intake --  Output 500 ml  Net -500 ml    Labs/Imaging Results for orders placed or performed during the hospital encounter of 03/19/22 (from the past 48 hour(s))  CBC with Differential     Status: Abnormal   Collection  Time: 03/19/22  7:50 PM  Result Value Ref Range   WBC 11.8 (H) 4.0 - 10.5 K/uL   RBC 4.84 3.87 - 5.11 MIL/uL   Hemoglobin 15.3 (H) 12.0 - 15.0 g/dL   HCT 45.2 36.0 - 46.0 %   MCV 93.4 80.0 - 100.0 fL   MCH 31.6 26.0 - 34.0 pg   MCHC 33.8 30.0 - 36.0 g/dL   RDW 11.6 11.5 - 15.5 %   Platelets 306 150 - 400 K/uL   nRBC 0.0 0.0 - 0.2 %   Neutrophils Relative % 82 %   Neutro Abs 9.6 (H) 1.7 - 7.7 K/uL   Lymphocytes Relative 11 %   Lymphs Abs 1.3 0.7 - 4.0 K/uL   Monocytes Relative 7 %   Monocytes Absolute 0.8 0.1 - 1.0 K/uL   Eosinophils Relative 0 %   Eosinophils Absolute 0.0 0.0 - 0.5 K/uL    Basophils Relative 0 %   Basophils Absolute 0.0 0.0 - 0.1 K/uL   Immature Granulocytes 0 %   Abs Immature Granulocytes 0.04 0.00 - 0.07 K/uL    Comment: Performed at [facility], [address] [place], [place] [address]  Comprehensive metabolic panel     Status: Abnormal   Collection Time: 03/19/22  7:50 PM  Result Value Ref Range   Sodium 138 135 - 145 mmol/L   Potassium 3.6 3.5 - 5.1 mmol/L   Chloride 100 98 - 111 mmol/L   CO2 26 22 - 32 mmol/L   Glucose, Bld 129 (H) 70 - 99 mg/dL    Comment: Glucose reference range applies only to samples taken after fasting for at least 8 hours.   BUN 16 8 - 23 mg/dL   Creatinine, Ser 0.88 0.44 - 1.00 mg/dL   Calcium 10.2 8.9 - 10.3 mg/dL   Total Protein 7.2 6.5 - 8.1 g/dL   Albumin 3.9 3.5 - 5.0 g/dL   AST 83 (H) 15 - 41 U/L   ALT 87 (H) 0 - 44 U/L   Alkaline Phosphatase 56 38 - 126 U/L   Total Bilirubin 0.5 0.3 - 1.2 mg/dL   GFR, Estimated >60 >60 mL/min    Comment: (NOTE) Calculated using the CKD-EPI Creatinine Equation (2021)    Anion gap 12 5 - 15    Comment: Performed at [facility] [address], [place], [place] [address]  Magnesium     Status: None   Collection Time: 03/19/22  7:50 PM  Result Value Ref Range   Magnesium 2.1 1.7 - 2.4 mg/dL    Comment: Performed at [facility], [place] [address], [place], [place] [address]   CT CHEST ABDOMEN PELVIS W CONTRAST  Result Date: 03/20/2022 CLINICAL DATA:  Patient presents with multiple calvarial lesions concerning for metastatic disease. EXAM: CT CHEST, ABDOMEN, AND PELVIS WITH CONTRAST TECHNIQUE: Multidetector CT imaging of the chest, abdomen and pelvis was performed following the standard protocol during bolus administration of intravenous contrast. RADIATION DOSE REDUCTION: This exam was performed according to the departmental dose-optimization program which includes automated exposure control, adjustment of the mA and/or kV according to patient size and/or use of  iterative reconstruction technique. CONTRAST:  30mL OMNIPAQUE IOHEXOL 350 MG/ML SOLN COMPARISON:  None Available. FINDINGS: CT CHEST FINDINGS Cardiovascular: Heart size appears normal. No significant pericardial effusion identified. Mediastinum/Nodes: Thyroid gland, trachea and esophagus are unremarkable. Multiple enlarged right mediastinal, multiple enlarged thoracic lymph nodes are identified which are predominantly right-sided. These include: -right internal mammary  lymph node measures 1.1 cm, image 27/4. -right paratracheal lymph node measures 2.3 cm, image 24/4. -subcarinal lymph node measures 1.9 cm, image 30/4. -right hilar node measures 1.7 cm, image 27/4. Lungs/Pleura: Small right pleural effusion identified, image 32/4. Subsegmental atelectasis is identified within the basilar right middle lobe and posterior right lower lobe. Bilateral pulmonary nodules are identified, including: -dominant spiculated nodule within the periphery of the right upper lobe measures 0.9 cm , image 59/5. -Nodule within the anteromedial right upper lobe measures 0.4 cm, image 65/5. Perifissural nodule along the minor fissure measures 3 mm, image 64/5. -anteromedial left upper lobe nodule measures 4 mm, image 63/5. Musculoskeletal: Multifocal lucent bone metastases are noted. -Within the left side of the T11 vertebra there is a lucent bone lesion measuring 1.6 cm, image 106/6. -Within the left lateral T7 vertebra there is a lucent bone lesion measuring 1.1 cm, image 79/5. -Lytic lesion with pathologic fracture involving the lateral aspect of the right fourth rib is identified, image 60/5. CT ABDOMEN PELVIS FINDINGS Hepatobiliary: No focal liver abnormality is seen. No gallstones, gallbladder wall thickening, or biliary dilatation. Pancreas: Unremarkable. No pancreatic ductal dilatation or surrounding inflammatory changes. Spleen: Normal in size without focal abnormality. Adrenals/Urinary Tract: Right adrenal mass measures 3.6 x 2.0  cm, image 52/4. Normal left adrenal gland. No kidney mass or hydronephrosis identified. Urinary bladder is unremarkable. Stomach/Bowel: Stomach appears normal. The appendix is visualized and is within normal limits. No bowel wall thickening, inflammation, or distension. Vascular/Lymphatic: No significant vascular findings are present. No enlarged abdominal or pelvic lymph nodes. Reproductive: Uterus and bilateral adnexa are unremarkable. Other: No ascites or fluid collection. No peritoneal nodularity identified. Musculoskeletal: Lucent bone lesion within the left iliac wing measures 1.3 cm, image 96/4. Lucent lesion within the right iliac bone adjacent to the SI joint measures 1.1 cm, image 91/4. Status post right hip arthroplasty. IMPRESSION: 1. Enlarged right-sided mediastinal, hilar and subcarinal lymph nodes are identified compatible with metastatic adenopathy. 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 2. Multiple small lung nodules are identified bilaterally. The dominant nodule is in the periphery of the right upper lobe. 3. Right adrenal mass, suspicious for metastatic disease. 4. 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 Pathologic fracture involving the lateral aspect of the right fourth rib. 5. Small right pleural effusion with subsegmental atelectasis within the basilar right middle lobe and posterior right lower lobe. Electronically Signed   By: Kerby Moors M.D.   On: 03/20/2022 10:28   CT Head W or Wo Contrast  Result Date: 03/19/2022 CLINICAL DATA:  Intracranial metastatic disease EXAM: CT HEAD WITHOUT AND WITH CONTRAST TECHNIQUE: Contiguous axial images were obtained from the base of the skull through the vertex without and with intravenous contrast. RADIATION DOSE REDUCTION: This exam was performed according to the departmental dose-optimization program which includes automated exposure control,  adjustment of the mA and/or kV according to patient size and/or use of iterative reconstruction technique. CONTRAST:  53mL OMNIPAQUE IOHEXOL 350 MG/ML SOLN COMPARISON:  Brain MRI 03/18/2022 FINDINGS: Brain: There is a posterior paramedian extra-axial mass near the torcula herophili with edema of the adjacent left occipital lobe. The intracranial portion of the mass measures 1.6 x 0.9 cm. There are no enhancing intraparenchymal lesions. Vascular: Filling defect at the torcula herophili extending partially into the superior sagittal sinus. Skull: Moth-eaten appearance of the occipital bone at the site of the above-described mass. The mass extends to the extracranial  space with a dome-shaped lesion measuring 1.2 x 0.6 cm. Sinuses/Orbits: No fluid levels or advanced mucosal thickening of the visualized paranasal sinuses. No mastoid or middle ear effusion. The orbits are normal. IMPRESSION: 1. 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 2. Edema of the left occipital lobe, adjacent to the above-described mass. 3. Filling defect at the torcula herophili extending partially into the superior sagittal sinus, concerning for tumor invasion. Electronically Signed   By: Ulyses Jarred M.D.   On: 03/19/2022 23:30   MR Brain Wo Contrast  Result Date: 03/18/2022 CLINICAL DATA:  Acute intractable headache.  Nausea and dizziness. EXAM: MRI HEAD WITHOUT CONTRAST TECHNIQUE: Multiplanar, multiecho pulse sequences of the brain and surrounding structures were obtained without intravenous contrast. COMPARISON:  MRI head 04/06/2015 FINDINGS: Brain: Ventricle size and cerebral volume normal. Negative for acute infarct. Mild edema in the left occipital lobe. There is associated occipital bone lesion with extraosseous mass extending intracranially which appears associated with the left occipital lobe edema. No other edema. Vascular: Negative  for hyperdense vessel Skull and upper cervical spine: Multiple skull lesions. Largest lesion in the occipital bone posteriorly measures 39 x 26 mm. This contains susceptibility likely due to bony fragmentation in the mass. Smaller lesions are seen in the high parietal bone bilaterally. Central skull base intact. Sinuses/Orbits: Paranasal sinuses clear. Bilateral mastoid effusion. Right cataract extraction Other: None IMPRESSION: 1. Negative for acute infarct. 2. Multiple skull lesions. The largest lesion is in the occipital bone, with adjacent intracranial extension of tumor. The smaller lesions are seen in the high parietal bone bilaterally. 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 3. 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 4. CT head   with contrast recommended for further evaluation. Electronically Signed   By: Franchot Gallo M.D.   On: 03/18/2022 17:19    Pending Labs Unresulted Labs (From admission, onward)    None       Vitals/Pain Today's Vitals   03/20/22 1330 03/20/22 1345 03/20/22 1352 03/20/22 1400  BP: (!) 144/75 (!) 144/74  (!) 147/75  Pulse: 72 73  72  Resp: 16 19  (!) 24  Temp:      TempSrc:      SpO2: 99% 99%  99%  Weight:   77 kg   Height:   5\' 1"  (1.549 m)   PainSc:        Isolation Precautions No active isolations  Medications Medications  dexamethasone (DECADRON) injection 2 mg (2 mg Intravenous Given 03/20/22 1357)  promethazine (PHENERGAN) tablet 25 mg (25 mg Oral Given 03/19/22 2059)  iohexol (OMNIPAQUE) 350 MG/ML injection 75 mL (75 mLs Intravenous Contrast Given 03/19/22 2157)  HYDROmorphone (DILAUDID) injection 1 mg (1 mg Intramuscular Given 03/20/22 0702)  metoCLOPramide (REGLAN) injection 10 mg (10 mg Intravenous Given 03/20/22 0653)  ondansetron (ZOFRAN) injection 4 mg (4 mg Intravenous Given 03/20/22 0653)  lactated ringers bolus 1,000 mL (0 mLs Intravenous Stopped 03/20/22 0825)   ondansetron (ZOFRAN) injection 4 mg (4 mg Intravenous Given 03/20/22 0923)  iohexol (OMNIPAQUE) 350 MG/ML injection 75 mL (75 mLs Intravenous Contrast Given 03/20/22 0959)  promethazine (PHENERGAN) 12.5 mg in sodium chloride 0.9 % 50 mL IVPB (0 mg Intravenous Stopped 03/20/22 1234)    Mobility walks with person assist Low fall risk   Focused Assessments Cardiac Assessment Handoff:    No results found for: "CKTOTAL", "CKMB", "CKMBINDEX", "  TROPONINI" Lab Results  Component Value Date   DDIMER 0.53 (H) 03/10/2022   Does the Patient currently have chest pain? No   , Neuro Assessment Handoff:  Swallow screen pass? Yes          Neuro Assessment: Exceptions to WDL Neuro Checks:      Last Documented NIHSS Modified Score:   Has TPA been given? No If patient is a Neuro Trauma and patient is going to OR before floor call report to 4N Charge nurse: [phone] or [phone]  , Pulmonary Assessment Handoff:  Lung sounds:   2 liters of oxygen via nasal cannula      R Recommendations: See Admitting Provider Note  Report given to:   Additional Notes:

## 2022-03-20 NOTE — ED Provider Notes (Signed)
[facility] EMERGENCY DEPARTMENT Provider Note   CSN: [id number] Arrival date & time: 03/19/22  1825     History  Chief Complaint  Patient presents with   Nausea    Lorraine Mills is a 71 y.o. female.  Presents to the emergency department for evaluation of headache, nausea and vomiting.  Patient was referred to the emergency department by her primary doctor.  Patient has been experiencing ongoing problems with headaches for 1 month.  She had an outpatient MRI that showed brain lesions.  Patient was prescribed antiemetics that were not helping, family member called back to primary care and she was told to come to the ED to potentially get neurosurgery consultation.       Home Medications Prior to Admission medications   Medication Sig Start Date End Date Taking? Authorizing Provider  acetaminophen (TYLENOL) 500 MG tablet Take 500 mg by mouth every 6 (six) hours as needed.    [provider]  azithromycin (ZITHROMAX) 250 MG tablet With food Take 2 tablets on day 1, then 1 tablet daily on days 2 through 5 03/18/22 03/23/22  McLean-Scocuzza, Nino Glow, MD  cetirizine (ZYRTEC) 10 MG tablet Take 10 mg by mouth daily as needed for allergies.    [provider]  fenofibrate micronized (LOFIBRA) 134 MG capsule Take 1 capsule (134 mg total) by mouth daily before breakfast. 02/06/22 02/01/23  [name], MD  fluticasone (FLONASE) 50 MCG/ACT nasal spray Place 1-2 sprays into both nostrils daily as needed for allergies or rhinitis.    [provider]  gabapentin (NEURONTIN) 100 MG capsule Take 1 capsule (100 mg total) by mouth 3 (three) times daily. 03/20/21   [name], MD  levothyroxine (SYNTHROID) 75 MCG tablet TAKE 1 TABLET EVERY DAY ON EMPTY STOMACHWITH A GLASS OF WATER AT LEAST 30-60 MINBEFORE BREAKFAST 12/22/21   Dutch Quint B, FNP  omeprazole (PRILOSEC) 20 MG capsule TAKE 1 CAPSULE BY MOUTH DAILY 01/28/21   [name], MD   ondansetron (ZOFRAN-ODT) 4 MG disintegrating tablet Take 1 tablet (4 mg total) by mouth every 8 (eight) hours as needed for nausea or vomiting. 03/18/22   McLean-Scocuzza, Nino Glow, MD  prednisoLONE acetate (PRED FORTE) 1 % ophthalmic suspension Place 1 drop into the right eye daily. 07/03/20   [provider]  promethazine (PHENERGAN) 12.5 MG tablet Take 1 tablet (12.5 mg total) by mouth 2 (two) times daily as needed for nausea or vomiting. 03/19/22   McLean-Scocuzza, Nino Glow, MD  RESTASIS 0.05 % ophthalmic emulsion 1 drop 2 (two) times daily. 09/30/21   [provider]  rosuvastatin (CRESTOR) 10 MG tablet TAKE ONE TABLET EVERY DAY. 03/06/22   [name], MD  traZODone (DESYREL) 50 MG tablet TAKE ONE TABLET AT BEDTIME AS NEEDED FORSLEEP. 01/03/22   [name], MD      Allergies    Latex and Levofloxacin    Review of Systems   Review of Systems  Physical Exam Updated Vital Signs BP (!) 143/80   Pulse 83   Temp (!) 97 F (36.1 C)   Resp 19   SpO2 91%  Physical Exam Vitals and nursing note reviewed.  Constitutional:      General: She is not in acute distress.    Appearance: She is well-developed.  HENT:     Head: Atraumatic.     Comments: Hard occipital nodule palpable    Mouth/Throat:     Mouth: Mucous membranes are moist.  Eyes:     General: Vision grossly intact. Gaze aligned appropriately.     Extraocular Movements: Extraocular movements intact.     Conjunctiva/sclera: Conjunctivae normal.  Cardiovascular:     Rate and Rhythm: Normal rate and regular rhythm.     Pulses: Normal pulses.     Heart sounds: Normal heart sounds, S1 normal and S2 normal. No murmur heard.    No friction rub. No gallop.  Pulmonary:     Effort: Pulmonary effort is normal. No respiratory distress.     Breath sounds: Normal breath sounds.  Abdominal:     General: Bowel sounds are normal.     Palpations: Abdomen is soft.     Tenderness: There is no abdominal tenderness.  There is no guarding or rebound.     Hernia: No hernia is present.  Musculoskeletal:        General: No swelling.     Cervical back: Full passive range of motion without pain, normal range of motion and neck supple. No spinous process tenderness or muscular tenderness. Normal range of motion.     Right lower leg: No edema.     Left lower leg: No edema.  Skin:    General: Skin is warm and dry.     Capillary Refill: Capillary refill takes less than 2 seconds.     Findings: No ecchymosis, erythema, rash or wound.  Neurological:     General: No focal deficit present.     Mental Status: She is alert and oriented to person, place, and time.     GCS: GCS eye subscore is 4. GCS verbal subscore is 5. GCS motor subscore is 6.     Cranial Nerves: Cranial nerves 2-12 are intact.     Sensory: Sensation is intact.     Motor: Motor function is intact.     Coordination: Coordination is intact.  Psychiatric:        Attention and Perception: Attention normal.        Mood and Affect: Mood normal.        Speech: Speech normal.        Behavior: Behavior normal.     ED Results / Procedures / Treatments   Labs (all labs ordered are listed, but only abnormal results are displayed) Labs Reviewed  CBC WITH DIFFERENTIAL/PLATELET - Abnormal; Notable for the following components:      Result Value   WBC 11.8 (*)    Hemoglobin 15.3 (*)    Neutro Abs 9.6 (*)    All other components within normal limits  COMPREHENSIVE METABOLIC PANEL - Abnormal; Notable for the following components:   Glucose, Bld 129 (*)    AST 83 (*)    ALT 87 (*)    All other components within normal limits  MAGNESIUM    EKG None  Radiology CT Head W or Wo Contrast  Result Date: 03/19/2022 CLINICAL DATA:  Intracranial metastatic disease EXAM: CT HEAD WITHOUT AND WITH CONTRAST TECHNIQUE: Contiguous axial images were obtained from the base of the skull through the vertex without and with intravenous contrast. RADIATION DOSE  REDUCTION: This exam was performed according to the departmental dose-optimization program which includes automated exposure control, adjustment of the mA and/or kV according to patient size and/or use of iterative reconstruction technique. CONTRAST:  19mL OMNIPAQUE IOHEXOL 350 MG/ML SOLN COMPARISON:  Brain MRI 03/18/2022 FINDINGS: Brain: There is a posterior paramedian extra-axial mass near the torcula herophili with edema of the adjacent left occipital lobe. The intracranial portion  of the mass measures 1.6 x 0.9 cm. There are no enhancing intraparenchymal lesions. Vascular: Filling defect at the torcula herophili extending partially into the superior sagittal sinus. Skull: Moth-eaten appearance of the occipital bone at the site of the above-described mass. The mass extends to the extracranial space with a dome-shaped lesion measuring 1.2 x 0.6 cm. Sinuses/Orbits: No fluid levels or advanced mucosal thickening of the visualized paranasal sinuses. No mastoid or middle ear effusion. The orbits are normal. IMPRESSION: 1. 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 2. Edema of the left occipital lobe, adjacent to the above-described mass. 3. Filling defect at the torcula herophili extending partially into the superior sagittal sinus, concerning for tumor invasion. Electronically Signed   By: Ulyses Jarred M.D.   On: 03/19/2022 23:30   MR Brain Wo Contrast  Result Date: 03/18/2022 CLINICAL DATA:  Acute intractable headache.  Nausea and dizziness. EXAM: MRI HEAD WITHOUT CONTRAST TECHNIQUE: Multiplanar, multiecho pulse sequences of the brain and surrounding structures were obtained without intravenous contrast. COMPARISON:  MRI head 04/06/2015 FINDINGS: Brain: Ventricle size and cerebral volume normal. Negative for acute infarct. Mild edema in the left occipital lobe. There is associated occipital bone lesion with  extraosseous mass extending intracranially which appears associated with the left occipital lobe edema. No other edema. Vascular: Negative for hyperdense vessel Skull and upper cervical spine: Multiple skull lesions. Largest lesion in the occipital bone posteriorly measures 39 x 26 mm. This contains susceptibility likely due to bony fragmentation in the mass. Smaller lesions are seen in the high parietal bone bilaterally. Central skull base intact. Sinuses/Orbits: Paranasal sinuses clear. Bilateral mastoid effusion. Right cataract extraction Other: None IMPRESSION: 1. Negative for acute infarct. 2. Multiple skull lesions. The largest lesion is in the occipital bone, with adjacent intracranial extension of tumor. The smaller lesions are seen in the high parietal bone bilaterally. 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 3. 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 4. CT head   with contrast recommended for further evaluation. Electronically Signed   By: Franchot Gallo M.D.   On: 03/18/2022 17:19    Procedures Procedures    Medications Ordered in ED Medications  promethazine (PHENERGAN) tablet 25 mg (25 mg Oral Given 03/19/22 2059)  iohexol (OMNIPAQUE) 350 MG/ML injection 75 mL (75 mLs Intravenous Contrast Given 03/19/22 2157)  HYDROmorphone (DILAUDID) injection 1 mg (1 mg Intramuscular Given 03/20/22 0702)  metoCLOPramide (REGLAN) injection 10 mg (10 mg Intravenous Given 03/20/22 0653)  ondansetron (ZOFRAN) injection 4 mg (4 mg Intravenous Given 03/20/22 0653)  lactated ringers bolus 1,000 mL (1,000 mLs Intravenous New Bag/Given 03/20/22 3818)    ED Course/ Medical Decision Making/ A&P                           Medical Decision Making Amount and/or Complexity of Data Reviewed Radiology: ordered.  Risk Prescription drug management.   Patient with 1 month of ongoing headaches referred to the ER for further evaluation after MRI  discovered skull base lesions.  Outpatient MRI findings as follows:  IMPRESSION: 1. Negative for acute infarct. 2. Multiple skull lesions. The largest lesion is in the occipital bone, with adjacent intracranial extension of tumor. The smaller lesions are seen in the high parietal bone bilaterally. 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 3. There is extraosseous extension of tumor in the  left occipital lobe with edema in the left occipital lobe. 4. CT head   with contrast recommended for further evaluation.  CT head with and without contrast performed as per recommendations from MRI:  IMPRESSION: 1. 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 2. Edema of the left occipital lobe, adjacent to the above-described mass. 3. Filling defect at the torcula herophili extending partially into the superior sagittal sinus, concerning for tumor invasion.  Discussed with Dr. Lorenso Courier, on-call for oncology.  He agrees that there does not appear to be any indication for neurosurgical intervention at this time.  His recommendations are to perform CT chest, abdomen and pelvis to further evaluate for possible primary.  Based on what the primary is felt to be, initial treatment modalities may vary.  We will therefore perform a CT chest abdomen and pelvis.  Dr. Lorenso Courier recommends reconsulting oncology after CT findings are available for further guidance. Will sign out to on-coming ER physician.        Final Clinical Impression(s) / ED Diagnoses Final diagnoses:  Skull mass    Rx / DC Orders ED Discharge Orders     None         Kaeley Vinje, Gwenyth Allegra, MD 03/20/22 [id number]

## 2022-03-20 NOTE — ED Notes (Signed)
Report called and given to [facility] at [facility].

## 2022-03-20 NOTE — ED Notes (Signed)
Admitting provider at bedside at this time to evaluate patient and update her and family on plan of care.

## 2022-03-20 NOTE — Consult Note (Signed)
[facility] CONSULT NOTE  Patient Care Team: [name], MD as PCP - General (Internal Medicine) [name], MD (Internal Medicine) Bary Castilla Forest Gleason, MD (General Surgery)  ASSESSMENT & PLAN:  Diffuse metastatic cancer, unknown primary I have reviewed imaging study myself The source of primary lesion is unknown I will consult pulmonologist, radiation oncologist and neuro oncologist for evaluation Explained to the patient and her son that we need biopsy to establish diagnosis I will start her on low-dose dexamethasone and IV fluid support  Dizziness and headaches Due to intracranial extension of skull lesion I will start her on IV fluid support I suspect it is an element of dehydration due to poor oral intake  Goals of care discussion She would likely be hospitalized over the weekend and early next week, pending further investigation  Discharge planning  I recommend inpatient hospitalization due to the severity of her burden of disease  The total time spent in the appointment was 80 minutes encounter with patients including review of chart and various tests results, discussions about plan of care and coordination of care plan   All questions were answered. The patient knows to call the clinic with any problems, questions or concerns. No barriers to learning was detected.  Heath Lark, MD 10/12/20234:32 PM  CHIEF COMPLAINTS/PURPOSE OF CONSULTATION:  Metastatic cancer, unknown primary but suspect primary lung cancer with metastatic lesion to the adrenal, bone, lung  HISTORY OF PRESENTING ILLNESS:  Lorraine Mills 71 y.o. female is here because of dizziness and headache Her son is by the bedside The patient actually has an appointment to see medical oncologist tomorrow She started to have intractable headache for about a month She also palpated a lesion at the back of her skull approximately the same time Her appetite is very poor and she has some  lightheadedness/dizziness.  She had vomiting recently when she was evaluated by primary care doctor.  She underwent MRI imaging which showed multiple skull lesion as well as intracranial extension of the tumor.  She was sent to the [facility] emergency department for urgent evaluation  She denies recent falls or balance difficulties She has been taking ibuprofen without good control of her symptoms She has lost weight due to poor oral intake The patient used to smoke but quit in the 90s  She has been to the emergency department in [place] on October 6 complaining of chest wall pain.  EKG and chest x-ray was unremarkable and she was discharged She denies recent cough or shortness of breath She had diagnostic mammogram evaluation and of last year that came back unremarkable  MEDICAL HISTORY:  Past Medical History:  Diagnosis Date   Anxiety    Arthritis    Depression 01/31/2016   Goiter    Hyperlipidemia    TGs   Hypothyroidism    Seasonal allergies     SURGICAL HISTORY: Past Surgical History:  Procedure Laterality Date   ABDOMINAL HYSTERECTOMY     supracervical    BILATERAL OOPHORECTOMY  2001   BENIGN TUMOR   BREAST BIOPSY Left 1985   cyst    CATARACT EXTRACTION Right    COLONOSCOPY  08/17/2009   COLONOSCOPY WITH PROPOFOL N/A 03/19/2016   Procedure: COLONOSCOPY WITH PROPOFOL;  Surgeon: Robert Bellow, MD;  Location: [facility] ENDOSCOPY;  Service: Endoscopy;  Laterality: N/A;   COLONOSCOPY WITH PROPOFOL N/A 05/15/2021   Procedure: COLONOSCOPY WITH PROPOFOL;  Surgeon: Robert Bellow, MD;  Location: [facility] ENDOSCOPY;  Service: Endoscopy;  Laterality: N/A;   CORNEAL TRANSPLANT Right    EYE SURGERY     JOINT REPLACEMENT     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   Procedure: TOTAL HIP ARTHROPLASTY ANTERIOR APPROACH;  Surgeon: Hessie Knows, MD;  Location: [facility] ORS;  Service: Orthopedics;  Laterality: Right;    SOCIAL HISTORY: Social History   Socioeconomic History    Marital status: Married    Spouse name: Not on file   Number of children: Not on file   Years of education: Not on file   Highest education level: Not on file  Occupational History   Occupation: retired  Tobacco Use   Smoking status: Former    Packs/day: 1.00    Years: 24.00    Total pack years: 24.00    Types: Cigarettes    Quit date: 05/09/1998    Years since quitting: 23.8   Smokeless tobacco: Never  Vaping Use   Vaping Use: Never used  Substance and Sexual Activity   Alcohol use: Not Currently    Comment: rarely    Drug use: No   Sexual activity: Not on file  Other Topics Concern   Not on file  Social History Narrative   Not on file   Social Determinants of Health   Financial Resource Strain: Low Risk  (03/29/2021)   Overall Financial Resource Strain (CARDIA)    Difficulty of Paying Living Expenses: Not hard at all  Food Insecurity: No Food Insecurity (03/29/2021)   Hunger Vital Sign    Worried About Running Out of Food in the Last Year: Never true    [place] in the Last Year: Never true  Transportation Needs: No Transportation Needs (03/29/2021)   PRAPARE - Hydrologist (Medical): No    Lack of Transportation (Non-Medical): No  Physical Activity: Insufficiently Active (03/29/2021)   Exercise Vital Sign    Days of Exercise per Week: 3 days    Minutes of Exercise per Session: 30 min  Stress: No Stress Concern Present (03/29/2021)   [place]    Feeling of Stress : Not at all  Social Connections: Unknown (03/29/2021)   Social Connection and Isolation Panel [NHANES]    Frequency of Communication with Friends and Family: Not on file    Frequency of Social Gatherings with Friends and Family: Not on file    Attends Religious Services: Not on file    Active Member of Clubs or Organizations: Not on file    Attends Archivist Meetings: Not on file     Marital Status: Married  Intimate Partner Violence: Not At Risk (03/29/2021)   Humiliation, Afraid, Rape, and Kick questionnaire    Fear of Current or Ex-Partner: No    Emotionally Abused: No    Physically Abused: No    Sexually Abused: No    FAMILY HISTORY: Family History  Problem Relation Age of Onset   Cancer Mother        OVARIAN   Stroke Father    Cancer Sister        breast   Breast cancer Sister 4   Atrial fibrillation Brother    Thyroid disease Other        MULTIPLE FAMILY MEMBERS    ALLERGIES:  is allergic to latex and levofloxacin.  MEDICATIONS:  Current Facility-Administered Medications  Medication Dose Route Frequency Provider Last Rate Last Admin   0.9 %  sodium chloride infusion   Intravenous  Continuous Heath Lark, MD       acetaminophen (TYLENOL) tablet 650 mg  650 mg Oral Q6H PRN Karmen Bongo, MD       Or   acetaminophen (TYLENOL) suppository 650 mg  650 mg Rectal Q6H PRN Karmen Bongo, MD       bisacodyl (DULCOLAX) EC tablet 5 mg  5 mg Oral Daily PRN Karmen Bongo, MD       cycloSPORINE (RESTASIS) 0.05 % ophthalmic emulsion 1 drop  1 drop Both Eyes BID Karmen Bongo, MD       dexamethasone (DECADRON) injection 2 mg  2 mg Intravenous BID Karmen Bongo, MD   2 mg at 03/20/22 1357   docusate sodium (COLACE) capsule 100 mg  100 mg Oral BID Karmen Bongo, MD       hydrALAZINE (APRESOLINE) injection 5 mg  5 mg Intravenous Q4H PRN Karmen Bongo, MD       Derrill Memo ON 03/21/2022] levothyroxine (SYNTHROID) tablet 75 mcg  75 mcg Oral QAC breakfast Karmen Bongo, MD       ondansetron [facility]) tablet 4 mg  4 mg Oral Q6H PRN Karmen Bongo, MD       Or   ondansetron [facility]) injection 4 mg  4 mg Intravenous Q6H PRN Karmen Bongo, MD       Derrill Memo ON 03/21/2022] pantoprazole (PROTONIX) EC tablet 40 mg  40 mg Oral Daily Karmen Bongo, MD       polyethylene glycol (MIRALAX / GLYCOLAX) packet 17 g  17 g Oral Daily PRN Karmen Bongo, MD        prednisoLONE acetate (PRED FORTE) 1 % ophthalmic suspension 1 drop  1 drop Right Eye Daily Karmen Bongo, MD       Derrill Memo ON 03/21/2022] rosuvastatin (CRESTOR) tablet 10 mg  10 mg Oral Daily Karmen Bongo, MD       traZODone (DESYREL) tablet 50 mg  50 mg Oral QHS PRN Karmen Bongo, MD        REVIEW OF SYSTEMS:   Constitutional: Denies fevers, chills or abnormal night sweats Eyes: Denies blurriness of vision, double vision or watery eyes Ears, nose, mouth, throat, and face: Denies mucositis or sore throat Respiratory: Denies cough, dyspnea or wheezes Cardiovascular: Denies palpitation, chest discomfort or lower extremity swelling Gastrointestinal:  Denies nausea, heartburn or change in bowel habits Skin: Denies abnormal skin rashes Lymphatics: Denies new lymphadenopathy or easy bruising ehavioral/Psych: Mood is stable, no new changes  All other systems were reviewed with the patient and are negative.  PHYSICAL EXAMINATION: ECOG PERFORMANCE STATUS: 2 - Symptomatic, <50% confined to bed  Vitals:   03/20/22 1400 03/20/22 1445  BP: (!) 147/75   Pulse: 72 75  Resp: (!) 24 16  Temp:    SpO2: 99% 96%   Filed Weights   03/20/22 1352  Weight: 169 lb 12.1 oz (77 kg)    GENERAL:alert, no distress and comfortable SKIN: skin color, texture, turgor are normal, no rashes or significant lesions EYES: normal, conjunctiva are pink and non-injected, sclera clear OROPHARYNX:no exudate, no erythema and lips, buccal mucosa, and tongue normal  NECK: supple, thyroid normal size, non-tender, without nodularity LYMPH:  no palpable lymphadenopathy in the cervical, axillary or inguinal LUNGS: clear to auscultation and percussion with normal breathing effort HEART: regular rate & rhythm and no murmurs and no lower extremity edema ABDOMEN:abdomen soft, non-tender and normal bowel sounds Musculoskeletal:no cyanosis of digits and no clubbing.  Palpable lesion on her skull PSYCH: alert & oriented x 3  with  fluent speech NEURO: no focal motor/sensory deficits  LABORATORY DATA:  I have reviewed the data as listed Lab Results  Component Value Date   WBC 11.8 (H) 03/19/2022   HGB 15.3 (H) 03/19/2022   HCT 45.2 03/19/2022   MCV 93.4 03/19/2022   PLT 306 03/19/2022   Recent Labs    02/05/22 1236 03/14/22 1115 03/18/22 1436 03/19/22 1950  NA 140 138 137 138  K 4.0 3.1* 3.8 3.6  CL 104 104 99 100  CO2 25 25 28 26   GLUCOSE 79 141* 135* 129*  BUN 17 23 18 16   CREATININE 0.77 0.81 0.76 0.88  CALCIUM 9.8 9.5 9.8 10.2  GFRNONAA  --  >60  --  >60  PROT 7.2  --   --  7.2  ALBUMIN 4.4  --   --  3.9  AST 17  --   --  83*  ALT 16  --   --  87*  ALKPHOS 60  --   --  56  BILITOT 0.4  --   --  0.5    RADIOGRAPHIC STUDIES: I have personally reviewed the radiological images as listed and agreed with the findings in the report. CT CHEST ABDOMEN PELVIS W CONTRAST  Result Date: 03/20/2022 CLINICAL DATA:  Patient presents with multiple calvarial lesions concerning for metastatic disease. EXAM: CT CHEST, ABDOMEN, AND PELVIS WITH CONTRAST TECHNIQUE: Multidetector CT imaging of the chest, abdomen and pelvis was performed following the standard protocol during bolus administration of intravenous contrast. RADIATION DOSE REDUCTION: This exam was performed according to the departmental dose-optimization program which includes automated exposure control, adjustment of the mA and/or kV according to patient size and/or use of iterative reconstruction technique. CONTRAST:  63mL OMNIPAQUE IOHEXOL 350 MG/ML SOLN COMPARISON:  None Available. FINDINGS: CT CHEST FINDINGS Cardiovascular: Heart size appears normal. No significant pericardial effusion identified. Mediastinum/Nodes: Thyroid gland, trachea and esophagus are unremarkable. Multiple enlarged right mediastinal, multiple enlarged thoracic lymph nodes are identified which are predominantly right-sided. These include: -right internal mammary lymph node  measures 1.1 cm, image 27/4. -right paratracheal lymph node measures 2.3 cm, image 24/4. -subcarinal lymph node measures 1.9 cm, image 30/4. -right hilar node measures 1.7 cm, image 27/4. Lungs/Pleura: Small right pleural effusion identified, image 32/4. Subsegmental atelectasis is identified within the basilar right middle lobe and posterior right lower lobe. Bilateral pulmonary nodules are identified, including: -dominant spiculated nodule within the periphery of the right upper lobe measures 0.9 cm , image 59/5. -Nodule within the anteromedial right upper lobe measures 0.4 cm, image 65/5. Perifissural nodule along the minor fissure measures 3 mm, image 64/5. -anteromedial left upper lobe nodule measures 4 mm, image 63/5. Musculoskeletal: Multifocal lucent bone metastases are noted. -Within the left side of the T11 vertebra there is a lucent bone lesion measuring 1.6 cm, image 106/6. -Within the left lateral T7 vertebra there is a lucent bone lesion measuring 1.1 cm, image 79/5. -Lytic lesion with pathologic fracture involving the lateral aspect of the right fourth rib is identified, image 60/5. CT ABDOMEN PELVIS FINDINGS Hepatobiliary: No focal liver abnormality is seen. No gallstones, gallbladder wall thickening, or biliary dilatation. Pancreas: Unremarkable. No pancreatic ductal dilatation or surrounding inflammatory changes. Spleen: Normal in size without focal abnormality. Adrenals/Urinary Tract: Right adrenal mass measures 3.6 x 2.0 cm, image 52/4. Normal left adrenal gland. No kidney mass or hydronephrosis identified. Urinary bladder is unremarkable. Stomach/Bowel: Stomach appears normal. The appendix is visualized and is within normal limits. No bowel wall thickening, inflammation,  or distension. Vascular/Lymphatic: No significant vascular findings are present. No enlarged abdominal or pelvic lymph nodes. Reproductive: Uterus and bilateral adnexa are unremarkable. Other: No ascites or fluid collection. No  peritoneal nodularity identified. Musculoskeletal: Lucent bone lesion within the left iliac wing measures 1.3 cm, image 96/4. Lucent lesion within the right iliac bone adjacent to the SI joint measures 1.1 cm, image 91/4. Status post right hip arthroplasty. IMPRESSION: 1. Enlarged right-sided mediastinal, hilar and subcarinal lymph nodes are identified compatible with metastatic adenopathy. 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 2. Multiple small lung nodules are identified bilaterally. The dominant nodule is in the periphery of the right upper lobe. 3. Right adrenal mass, suspicious for metastatic disease. 4. 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 Pathologic fracture involving the lateral aspect of the right fourth rib. 5. Small right pleural effusion with subsegmental atelectasis within the basilar right middle lobe and posterior right lower lobe. Electronically Signed   By: Kerby Moors M.D.   On: 03/20/2022 10:28   CT Head W or Wo Contrast  Result Date: 03/19/2022 CLINICAL DATA:  Intracranial metastatic disease EXAM: CT HEAD WITHOUT AND WITH CONTRAST TECHNIQUE: Contiguous axial images were obtained from the base of the skull through the vertex without and with intravenous contrast. RADIATION DOSE REDUCTION: This exam was performed according to the departmental dose-optimization program which includes automated exposure control, adjustment of the mA and/or kV according to patient size and/or use of iterative reconstruction technique. CONTRAST:  4mL OMNIPAQUE IOHEXOL 350 MG/ML SOLN COMPARISON:  Brain MRI 03/18/2022 FINDINGS: Brain: There is a posterior paramedian extra-axial mass near the torcula herophili with edema of the adjacent left occipital lobe. The intracranial portion of the mass measures 1.6 x 0.9 cm. There are no enhancing intraparenchymal lesions. Vascular: Filling defect at the torcula  herophili extending partially into the superior sagittal sinus. Skull: Moth-eaten appearance of the occipital bone at the site of the above-described mass. The mass extends to the extracranial space with a dome-shaped lesion measuring 1.2 x 0.6 cm. Sinuses/Orbits: No fluid levels or advanced mucosal thickening of the visualized paranasal sinuses. No mastoid or middle ear effusion. The orbits are normal. IMPRESSION: 1. 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 2. Edema of the left occipital lobe, adjacent to the above-described mass. 3. Filling defect at the torcula herophili extending partially into the superior sagittal sinus, concerning for tumor invasion. Electronically Signed   By: Ulyses Jarred M.D.   On: 03/19/2022 23:30   MR Brain Wo Contrast  Result Date: 03/18/2022 CLINICAL DATA:  Acute intractable headache.  Nausea and dizziness. EXAM: MRI HEAD WITHOUT CONTRAST TECHNIQUE: Multiplanar, multiecho pulse sequences of the brain and surrounding structures were obtained without intravenous contrast. COMPARISON:  MRI head 04/06/2015 FINDINGS: Brain: Ventricle size and cerebral volume normal. Negative for acute infarct. Mild edema in the left occipital lobe. There is associated occipital bone lesion with extraosseous mass extending intracranially which appears associated with the left occipital lobe edema. No other edema. Vascular: Negative for hyperdense vessel Skull and upper cervical spine: Multiple skull lesions. Largest lesion in the occipital bone posteriorly measures 39 x 26 mm. This contains susceptibility likely due to bony fragmentation in the mass. Smaller lesions are seen in the high parietal bone bilaterally. Central skull base intact. Sinuses/Orbits: Paranasal sinuses clear. Bilateral mastoid effusion. Right cataract extraction Other: None IMPRESSION: 1. Negative for acute infarct.  2. Multiple skull  lesions. The largest lesion is in the occipital bone, with adjacent intracranial extension of tumor. The smaller lesions are seen in the high parietal bone bilaterally. 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 3. 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 4. CT head   with contrast recommended for further evaluation. Electronically Signed   By: Franchot Gallo M.D.   On: 03/18/2022 17:19   DG Chest 2 View  Result Date: 03/14/2022 CLINICAL DATA:  Chest pain and shortness of breath EXAM: CHEST - 2 VIEW COMPARISON:  None Available. FINDINGS: The heart size and mediastinal contours are within normal limits. Both lungs are clear. The visualized skeletal structures are unremarkable. IMPRESSION: No acute cardiopulmonary abnormality. Electronically Signed   By: Beryle Flock M.D.   On: 03/14/2022 11:58

## 2022-03-20 NOTE — ED Notes (Signed)
Pt attempting to drink CT contrast

## 2022-03-20 NOTE — ED Notes (Signed)
Patient resting at this time. Respirations even and unlabored. Family remains at bedside with patient. NIBP, cardiac monitor and pulse ox remain in place.

## 2022-03-20 NOTE — ED Notes (Signed)
This RN assisted patient to bedside commode with stand by assist. Patient tolerated well and assisted back into bed at this time. NIBP, cardiac monitor and pulse ox remain in place.

## 2022-03-20 NOTE — ED Notes (Signed)
Pt taken to CT.

## 2022-03-20 NOTE — ED Provider Notes (Signed)
Pt signed out by Dr. Betsey Holiday pending CT chest/abd/pelvis.  The films were reviewed by me.  I agree with the radiologist.  CT chest/abd/pelvis:  IMPRESSION:  1. Enlarged right-sided mediastinal, hilar and subcarinal lymph  nodes are identified compatible with metastatic adenopathy. Findings  are suggestive of a right-sided primary bronchogenic carcinoma.  2. Multiple small lung nodules are identified bilaterally. The  dominant nodule is in the periphery of the right upper lobe.  3. Right adrenal mass, suspicious for metastatic disease.  4. Multifocal lucent bone metastases involving the thoracic spine,  right fourth rib, and bilateral iliac bones. Pathologic fracture  involving the lateral aspect of the right fourth rib.  5. Small right pleural effusion with subsegmental atelectasis within  the basilar right middle lobe and posterior right lower lobe.   Results d/w patient.  Pt is still very nauseous and feels terrible.  She is unable to tolerate po fluids.  Pt d/w Dr. Alvy Bimler (oncology).  She requests admission to [facility] if possible.  No steroids for now.  Pt d/w Dr. Lorin Mercy (triad) for admission.   Isla Pence, MD 03/20/22 1152

## 2022-03-20 NOTE — ED Notes (Signed)
[facility] contacted in regards to transporting patient to [place] at this time. Report given to RN at [facility] and all documents printed off for patient prior to leaving facility.

## 2022-03-21 ENCOUNTER — Inpatient Hospital Stay: Payer: PPO

## 2022-03-21 ENCOUNTER — Inpatient Hospital Stay: Payer: PPO | Admitting: Oncology

## 2022-03-21 ENCOUNTER — Ambulatory Visit
Admission: RE | Admit: 2022-03-21 | Discharge: 2022-03-21 | Disposition: A | Discharge status: Home / Self Care | Payer: PPO | Source: Ambulatory Visit | Attending: Radiation Oncology | Admitting: Radiation Oncology

## 2022-03-21 DIAGNOSIS — R590 Localized enlarged lymph nodes: Secondary | ICD-10-CM

## 2022-03-21 DIAGNOSIS — R9089 Other abnormal findings on diagnostic imaging of central nervous system: Secondary | ICD-10-CM | POA: Diagnosis not present

## 2022-03-21 DIAGNOSIS — C799 Secondary malignant neoplasm of unspecified site: Secondary | ICD-10-CM | POA: Diagnosis not present

## 2022-03-21 DIAGNOSIS — E039 Hypothyroidism, unspecified: Secondary | ICD-10-CM | POA: Diagnosis not present

## 2022-03-21 DIAGNOSIS — C7951 Secondary malignant neoplasm of bone: Secondary | ICD-10-CM

## 2022-03-21 DIAGNOSIS — M898X8 Other specified disorders of bone, other site: Principal | ICD-10-CM

## 2022-03-21 DIAGNOSIS — M8448XA Pathological fracture, other site, initial encounter for fracture: Secondary | ICD-10-CM | POA: Diagnosis not present

## 2022-03-21 DIAGNOSIS — C3490 Malignant neoplasm of unspecified part of unspecified bronchus or lung: Secondary | ICD-10-CM

## 2022-03-21 DIAGNOSIS — E785 Hyperlipidemia, unspecified: Secondary | ICD-10-CM | POA: Diagnosis not present

## 2022-03-21 LAB — BASIC METABOLIC PANEL
Anion gap: 9 (ref 5–15)
BUN: 15 mg/dL (ref 8–23)
CO2: 27 mmol/L (ref 22–32)
Calcium: 9.3 mg/dL (ref 8.9–10.3)
Chloride: 103 mmol/L (ref 98–111)
Creatinine, Ser: 0.74 mg/dL (ref 0.44–1.00)
GFR, Estimated: >60 mL/min (ref >60)
Glucose, Bld: 117 mg/dL — ABNORMAL HIGH (ref 70–99)
Potassium: 3.8 mmol/L (ref 3.5–5.1)
Sodium: 139 mmol/L (ref 135–145)

## 2022-03-21 LAB — PROTIME-INR
INR: 1 (ref 0.8–1.2)
Prothrombin Time: 12.9 seconds (ref 11.4–15.2)

## 2022-03-21 LAB — BLOOD GAS, ARTERIAL
Acid-Base Excess: 4.9 mmol/L — ABNORMAL HIGH (ref 0.0–2.0)
Bicarbonate: 28.1 mmol/L — ABNORMAL HIGH (ref 20.0–28.0)
O2 Saturation: 96.9 %
Patient temperature: 37
pCO2 arterial: 36 mmHg (ref 32–48)
pH, Arterial: 7.5 — ABNORMAL HIGH (ref 7.35–7.45)
pO2, Arterial: 74 mmHg — ABNORMAL LOW (ref 83–108)

## 2022-03-21 LAB — CBC
HCT: 42.5 % (ref 36.0–46.0)
Hemoglobin: 14.3 g/dL (ref 12.0–15.0)
MCH: 31.6 pg (ref 26.0–34.0)
MCHC: 33.6 g/dL (ref 30.0–36.0)
MCV: 94 fL (ref 80.0–100.0)
Platelets: 262 10*3/uL (ref 150–400)
RBC: 4.52 MIL/uL (ref 3.87–5.11)
RDW: 11.6 % (ref 11.5–15.5)
WBC: 9.9 10*3/uL (ref 4.0–10.5)
nRBC: 0 % (ref 0.0–0.2)

## 2022-03-21 LAB — HIV ANTIBODY (ROUTINE TESTING W REFLEX): HIV Screen 4th Generation wRfx: NONREACTIVE

## 2022-03-21 MED ORDER — LORATADINE 10 MG PO TABS
10.0000 mg | ORAL_TABLET | Freq: Every day | ORAL | Status: DC
  Administered 2022-03-21 – 2022-03-27 (×6): 10 mg via ORAL
  Filled 2022-03-21 (×6): qty 1

## 2022-03-21 MED ORDER — FLUTICASONE PROPIONATE 50 MCG/ACT NA SUSP: 1.0000 | Freq: Every day | NASAL | Status: DC | PRN

## 2022-03-21 MED ORDER — ENSURE ENLIVE PO LIQD
237.0000 mL | ORAL | Status: DC
  Administered 2022-03-21 – 2022-03-25 (×3): 237 mL via ORAL

## 2022-03-21 MED ORDER — ENOXAPARIN SODIUM 30 MG/0.3ML IJ SOSY
30.0000 mg | PREFILLED_SYRINGE | INTRAMUSCULAR | Status: DC
  Administered 2022-03-21: 30 mg via SUBCUTANEOUS
  Filled 2022-03-21: qty 0.3

## 2022-03-21 MED ORDER — ADULT MULTIVITAMIN W/MINERALS CH
1.0000 | ORAL_TABLET | Freq: Every day | ORAL | Status: DC
  Administered 2022-03-21 – 2022-03-27 (×6): 1 via ORAL
  Filled 2022-03-21 (×6): qty 1

## 2022-03-21 NOTE — Progress Notes (Signed)
Initial Nutrition Assessment  INTERVENTION:   -Ensure Plus High Protein po BID, each supplement provides 350 kcal and 20 grams of protein.   -Multivitamin with minerals daily  NUTRITION DIAGNOSIS:   Increased nutrient needs related to chronic illness, cancer and cancer related treatments as evidenced by estimated needs.  GOAL:   Patient will meet greater than or equal to 90% of their needs  MONITOR:   PO intake, Supplement acceptance, Labs, Weight trends, I & O's  REASON FOR ASSESSMENT:   Consult Assessment of nutrition requirement/status  ASSESSMENT:   71 y.o. female with medical history significant of HLD, hypothyroidism, and depression/anxiety presenting with headache and n/v in the setting of a recently diagnosed brain mass.  "I have lung cancer."  She noticed about a month ago a headache.  She noticed a knot in her skull and then noticed some pain in her chest and SOB maybe 1-2 weeks later and was getting worse. Admitted with metastatic lung cancer.  Patient reports poor PO for that lat 2 days PTA. Pt states typically she eats well with good appetite. Consumes 3 meals a day and consumes meats and dairy foods. Pt had eggs and bacon this morning for breakfast and is tolerating. Was NPO for biopsy, was unable to be done so now back on regular diet.  Pt is agreeable to protein shakes, have ordered Ensure supplements. She takes a MVI at home so have restarted that.  Per weight records, wt is stable. Pt reports UBW ~168-170 lbs. Current weight: 169 lbs  Medications: Colace  Labs reviewed.  NUTRITION - FOCUSED PHYSICAL EXAM:  Flowsheet Row Most Recent Value  Orbital Region No depletion  Upper Arm Region No depletion  Thoracic and Lumbar Region No depletion  Buccal Region No depletion  Temple Region No depletion  Clavicle Bone Region Mild depletion  Clavicle and Acromion Bone Region No depletion  Scapular Bone Region No depletion  Dorsal Hand Mild depletion  Patellar  Region No depletion  Anterior Thigh Region No depletion  Posterior Calf Region No depletion  Edema (RD Assessment) None  Hair Reviewed  Eyes Reviewed  Mouth Reviewed  Skin Reviewed       Diet Order:   Diet Order             Diet regular Room service appropriate? Yes; Fluid consistency: Thin  Diet effective now                   EDUCATION NEEDS:   No education needs have been identified at this time  Skin:  Skin Assessment: Reviewed RN Assessment  Last BM:  10/10  Height:   Ht Readings from Last 1 Encounters:  03/20/22 5\' 1"  (1.549 m)    Weight:   Wt Readings from Last 1 Encounters:  03/20/22 77 kg    BMI:  Body mass index is 32.07 kg/m.  Estimated Nutritional Needs:   Kcal:  1500-1700  Protein:  75-90g  Fluid:  1.7L/day  Clayton Bibles, MS, RD, LDN Inpatient Clinical Dietitian Contact information available via Amion

## 2022-03-21 NOTE — Consult Note (Signed)
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    ICD-10-CM   1. Skull mass  M89.8X8     2. Lung mass  R91.8 0.9 %  sodium chloride infusion    3. Multiple lesions of metastatic malignancy ([place])  C79.9     4. Nausea  R11.0     5. Pathologic rib fracture, initial encounter  M84.48XA     6. Abnormal brain MRI  R90.89 DISCONTINUED: dexamethasone (DECADRON) tablet 2 mg    7. Primary malignant neoplasm of lung metastatic to other site, unspecified laterality (HCC)  C34.90 dexamethasone (DECADRON) injection 2 mg    DISCONTINUED: dexamethasone (DECADRON) tablet 2 mg      HISTORY OF PRESENT ILLNESS: Lorraine Mills is a 71 y.o. female seen at the request of Dr. Alvy Bimler.  She recently presented to the emergency department on 03/20/2022 with complaints of persistent headaches, dizziness, nausea and vomiting.  The headaches have been ongoing for approximately 1 month and she also noticed a knot on the back of her head approximately 3 weeks ago that is tender to palpate.  This is where her headaches tend to generate from and radiate up the left side of her head to her forehead.  She denies any associated visual changes but has had some dizziness/imbalance.  She was seen recently at a urgent care clinic with complaints of chest pain and shortness of breath with chest x-ray showing  "air bags" in the bottoms of her lungs.  She was also evaluated at the emergency department at [facility] on Thursday, 03/13/2022 due to severe headache and had an MRI that showed multiple lesions on her skull.  The largest lesion is in the occipital bone, with adjacent intracranial and extraosseous  extension of tumor. The smaller lesions are seen in the high parietal bone bilaterally and felt most likely to represent metastatic disease.Since that time, her headaches have gotten worse.  She reports and approximately 10 pound weight loss, mostly over the last week or two, due to N/V.  On 03/19/2022, she had a CT head with and without contrast showing a posterior midline extra-axial metastasis extending through the adjacent occipital calvarium, with a small extra-axial soft tissue component and associated edema.  The CT C/A/P was performed 03/20/2022 showing multiple enlarged right-sided mediastinal, hilar and subcarinal lymph nodes, multiple small lung nodules bilaterally with the dominant nodule in the periphery of the right upper lobe lung.  Additionally, there was a right adrenal mass, suspicious for metastatic disease and multifocal lucent bone metastases involving the thoracic spine, right fourth rib with pathologic fracture, and bilateral iliac bones.  She was started on dexamethasone and has had resolution of the headaches and N/V.  There is still some tenderness with palpation of the occipital skull lesion.  We have kindly been asked to consult this patient for consideration of radiotherapy  PREVIOUS RADIATION THERAPY: No  PAST MEDICAL HISTORY:  Past Medical History:  Diagnosis Date   Anxiety    Arthritis    Depression 01/31/2016   Goiter    Hyperlipidemia    TGs   Hypothyroidism    Seasonal allergies       PAST SURGICAL HISTORY: Past Surgical History:  Procedure Laterality Date   ABDOMINAL HYSTERECTOMY     supracervical    BILATERAL OOPHORECTOMY  2001   BENIGN TUMOR   BREAST BIOPSY Left 1985   cyst    CATARACT EXTRACTION Right    COLONOSCOPY  08/17/2009   COLONOSCOPY WITH PROPOFOL N/A 03/19/2016   Procedure: COLONOSCOPY WITH PROPOFOL;  Surgeon: Robert Bellow, MD;  Location: [facility] END[facility]COPY;  Service: Endoscopy;  Laterality: N/A;   COLONOSCOPY WITH PROPOFOL N/A 05/15/2021    Procedure: COLONOSCOPY WITH PROPOFOL;  Surgeon: Robert Bellow, MD;  Location: [facility] ENDOSCOPY;  Service: Endoscopy;  Laterality: N/A;   CORNEAL TRANSPLANT Right    EYE SURGERY     JOINT REPLACEMENT     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   Procedure: TOTAL HIP ARTHROPLASTY ANTERIOR APPROACH;  Surgeon: Hessie Knows, MD;  Location: [facility] ORS;  Service: Orthopedics;  Laterality: Right;    FAMILY HISTORY:  Family History  Problem Relation Age of Onset   Cancer Mother        OVARIAN   Stroke Father    Cancer Sister        breast   Breast cancer Sister 44   Atrial fibrillation Brother    Thyroid disease Other        MULTIPLE FAMILY MEMBERS    SOCIAL HISTORY:  Social History   Socioeconomic History   Marital status: Married    Spouse name: Not on file   Number of children: Not on file   Years of education: Not on file   Highest education level: Not on file  Occupational History   Occupation: retired  Tobacco Use   Smoking status: Former    Packs/day: 1.00    Years: 24.00    Total pack years: 24.00    Types: Cigarettes    Quit date: 05/09/1998    Years since quitting: 23.8   Smokeless tobacco: Never  Vaping Use   Vaping Use: Never used  Substance and Sexual Activity   Alcohol use: Not Currently    Comment: rarely    Drug use: No   Sexual activity: Not on file  Other Topics Concern   Not on file  Social History Narrative   Not on file   Social Determinants of Health   Financial Resource Strain: Low Risk  (03/29/2021)   Overall Financial Resource Strain (CARDIA)    Difficulty of Paying Living Expenses: Not hard at all  Food Insecurity: No Food Insecurity (03/20/2022)   Hunger Vital Sign    Worried About Running Out of Food in the Last Year: Never true    [place] in the Last Year: Never true  Transportation Needs: No Transportation Needs (03/20/2022)   PRAPARE - Hydrologist (Medical): No    Lack of Transportation  (Non-Medical): No  Physical Activity: Insufficiently Active (03/29/2021)   Exercise Vital Sign    Days of Exercise per Week: 3 days    Minutes of Exercise per Session: 30 min  Stress: No Stress Concern Present (03/29/2021)   [place]    Feeling of Stress : Not at all  Social Connections: Unknown (03/29/2021)   Social Connection and Isolation Panel [NHANES]    Frequency of Communication with Friends and Family: Not on file    Frequency of Social Gatherings with Friends and Family: Not on file    Attends Religious Services: Not on file    Active Member of Clubs or  Organizations: Not on file    Attends Club or Organization Meetings: Not on file    Marital Status: Married  Intimate Partner Violence: Not At Risk (03/20/2022)   Humiliation, Afraid, Rape, and Kick questionnaire    Fear of Current or Ex-Partner: No    Emotionally Abused: No    Physically Abused: No    Sexually Abused: No    ALLERGIES: Latex and Levofloxacin  MEDICATIONS:  Current Facility-Administered Medications  Medication Dose Route Frequency Provider Last Rate Last Admin   0.9 %  sodium chloride infusion   Intravenous Continuous Heath Lark, MD 75 mL/hr at 03/21/22 0553 New Bag at 03/21/22 0553   acetaminophen (TYLENOL) tablet 650 mg  650 mg Oral Q6H PRN Karmen Bongo, MD       Or   acetaminophen (TYLENOL) suppository 650 mg  650 mg Rectal Q6H PRN Karmen Bongo, MD       bisacodyl (DULCOLAX) EC tablet 5 mg  5 mg Oral Daily PRN Karmen Bongo, MD       cycloSPORINE (RESTASIS) 0.05 % ophthalmic emulsion 1 drop  1 drop Both Eyes BID Karmen Bongo, MD   1 drop at 03/21/22 0939   dexamethasone (DECADRON) injection 2 mg  2 mg Intravenous BID Karmen Bongo, MD   2 mg at 03/21/22 6301   docusate sodium (COLACE) capsule 100 mg  100 mg Oral BID Karmen Bongo, MD   100 mg at 03/21/22 0938   enoxaparin (LOVENOX) injection 30 mg  30 mg Subcutaneous  Q24H Eugenie Filler, MD       feeding supplement (ENSURE ENLIVE / ENSURE PLUS) liquid 237 mL  237 mL Oral Q24H Eugenie Filler, MD   237 mL at 03/21/22 1442   fluticasone (FLONASE) 50 MCG/ACT nasal spray 1-2 spray  1-2 spray Each Nare Daily PRN Eugenie Filler, MD       hydrALAZINE (APRESOLINE) injection 5 mg  5 mg Intravenous Q4H PRN Karmen Bongo, MD       levothyroxine (SYNTHROID) tablet 75 mcg  75 mcg Oral QAC breakfast Karmen Bongo, MD   75 mcg at 03/21/22 0556   loratadine (CLARITIN) tablet 10 mg  10 mg Oral Daily Eugenie Filler, MD   10 mg at 03/21/22 6010   multivitamin with minerals tablet 1 tablet  1 tablet Oral Daily Eugenie Filler, MD   1 tablet at 03/21/22 1442   ondansetron (ZOFRAN) tablet 4 mg  4 mg Oral Q6H PRN Karmen Bongo, MD       Or   ondansetron [facility]) injection 4 mg  4 mg Intravenous Q6H PRN Karmen Bongo, MD       pantoprazole (PROTONIX) EC tablet 40 mg  40 mg Oral Daily Karmen Bongo, MD   40 mg at 03/21/22 9323   polyethylene glycol (MIRALAX / GLYCOLAX) packet 17 g  17 g Oral Daily PRN Karmen Bongo, MD       prednisoLONE acetate (PRED FORTE) 1 % ophthalmic suspension 1 drop  1 drop Right Eye Daily Karmen Bongo, MD   1 drop at 03/21/22 0939   rosuvastatin (CRESTOR) tablet 10 mg  10 mg Oral Daily Karmen Bongo, MD   10 mg at 03/21/22 5573   traZODone (DESYREL) tablet 50 mg  50 mg Oral QHS PRN Karmen Bongo, MD        REVIEW OF SYSTEMS:  On review of systems, the patient reports that she is doing well overall.  She denies any chest pain, shortness of breath, cough,  fevers, chills.  She has had an approximate 10 pound weight loss, mostly associated with the recent nausea and vomiting.  Fortunately, since starting steroids, her headaches and nausea and vomiting have resolved.  She denies any bowel or bladder disturbances, and denies abdominal pain, nausea or vomiting.  She denies any new musculoskeletal or joint aches or pains aside from  some tenderness to palpation of the occipital skull lesion. A complete review of systems is obtained and is otherwise negative.  PHYSICAL EXAM:  Wt Readings from Last 3 Encounters:  03/20/22 169 lb 12.1 oz (77 kg)  03/18/22 170 lb 9.6 oz (77.4 kg)  03/14/22 168 lb (76.2 kg)   Temp Readings from Last 3 Encounters:  03/21/22 98.4 F (36.9 C) (Oral)  03/18/22 (!) 97.5 F (36.4 C) (Oral)  03/14/22 97.6 F (36.4 C) (Oral)   BP Readings from Last 3 Encounters:  03/21/22 (!) 159/71  03/18/22 138/78  03/14/22 (!) 164/73   Pulse Readings from Last 3 Encounters:  03/21/22 94  03/18/22 87  03/14/22 90   Pain Assessment Pain Score: 0-No pain/10  In general this is a well appearing Caucasian female in no acute distress.  She's alert and oriented x4 and appropriate throughout the examination. Cardiopulmonary assessment is negative for acute distress and she exhibits normal effort.   KPS = 90  100 - Normal; no complaints; no evidence of disease. 90   - Able to carry on normal activity; minor signs or symptoms of disease. 80   - Normal activity with effort; some signs or symptoms of disease. 14   - Cares for self; unable to carry on normal activity or to do active work. 60   - Requires occasional assistance, but is able to care for most of his personal needs. 50   - Requires considerable assistance and frequent medical care. 4   - Disabled; requires special care and assistance. 63   - Severely disabled; hospital admission is indicated although death not imminent. 48   - Very sick; hospital admission necessary; active supportive treatment necessary. 10   - Moribund; fatal processes progressing rapidly. 0     - Dead  Karnofsky DA, Abelmann Weldon, Craver LS and Burchenal JH [phone]) The use of the nitrogen mustards in the palliative treatment of carcinoma: with particular reference to bronchogenic carcinoma Cancer 1 634-56  LABORATORY DATA:  Lab Results  Component Value Date   WBC 9.9  03/21/2022   HGB 14.3 03/21/2022   HCT 42.5 03/21/2022   MCV 94.0 03/21/2022   PLT 262 03/21/2022   Lab Results  Component Value Date   NA 139 03/21/2022   K 3.8 03/21/2022   CL 103 03/21/2022   CO2 27 03/21/2022   Lab Results  Component Value Date   ALT 87 (H) 03/19/2022   AST 83 (H) 03/19/2022   ALKPHOS 56 03/19/2022   BILITOT 0.5 03/19/2022     RADIOGRAPHY: CT CHEST ABDOMEN PELVIS W CONTRAST  Result Date: 03/20/2022 CLINICAL DATA:  Patient presents with multiple calvarial lesions concerning for metastatic disease. EXAM: CT CHEST, ABDOMEN, AND PELVIS WITH CONTRAST TECHNIQUE: Multidetector CT imaging of the chest, abdomen and pelvis was performed following the standard protocol during bolus administration of intravenous contrast. RADIATION DOSE REDUCTION: This exam was performed according to the departmental dose-optimization program which includes automated exposure control, adjustment of the mA and/or kV according to patient size and/or use of iterative reconstruction technique. CONTRAST:  66mL OMNIPAQUE IOHEXOL 350 MG/ML SOLN COMPARISON:  None Available.  FINDINGS: CT CHEST FINDINGS Cardiovascular: Heart size appears normal. No significant pericardial effusion identified. Mediastinum/Nodes: Thyroid gland, trachea and esophagus are unremarkable. Multiple enlarged right mediastinal, multiple enlarged thoracic lymph nodes are identified which are predominantly right-sided. These include: -right internal mammary lymph node measures 1.1 cm, image 27/4. -right paratracheal lymph node measures 2.3 cm, image 24/4. -subcarinal lymph node measures 1.9 cm, image 30/4. -right hilar node measures 1.7 cm, image 27/4. Lungs/Pleura: Small right pleural effusion identified, image 32/4. Subsegmental atelectasis is identified within the basilar right middle lobe and posterior right lower lobe. Bilateral pulmonary nodules are identified, including: -dominant spiculated nodule within the periphery of the right  upper lobe measures 0.9 cm , image 59/5. -Nodule within the anteromedial right upper lobe measures 0.4 cm, image 65/5. Perifissural nodule along the minor fissure measures 3 mm, image 64/5. -anteromedial left upper lobe nodule measures 4 mm, image 63/5. Musculoskeletal: Multifocal lucent bone metastases are noted. -Within the left side of the T11 vertebra there is a lucent bone lesion measuring 1.6 cm, image 106/6. -Within the left lateral T7 vertebra there is a lucent bone lesion measuring 1.1 cm, image 79/5. -Lytic lesion with pathologic fracture involving the lateral aspect of the right fourth rib is identified, image 60/5. CT ABDOMEN PELVIS FINDINGS Hepatobiliary: No focal liver abnormality is seen. No gallstones, gallbladder wall thickening, or biliary dilatation. Pancreas: Unremarkable. No pancreatic ductal dilatation or surrounding inflammatory changes. Spleen: Normal in size without focal abnormality. Adrenals/Urinary Tract: Right adrenal mass measures 3.6 x 2.0 cm, image 52/4. Normal left adrenal gland. No kidney mass or hydronephrosis identified. Urinary bladder is unremarkable. Stomach/Bowel: Stomach appears normal. The appendix is visualized and is within normal limits. No bowel wall thickening, inflammation, or distension. Vascular/Lymphatic: No significant vascular findings are present. No enlarged abdominal or pelvic lymph nodes. Reproductive: Uterus and bilateral adnexa are unremarkable. Other: No ascites or fluid collection. No peritoneal nodularity identified. Musculoskeletal: Lucent bone lesion within the left iliac wing measures 1.3 cm, image 96/4. Lucent lesion within the right iliac bone adjacent to the SI joint measures 1.1 cm, image 91/4. Status post right hip arthroplasty. IMPRESSION: 1. Enlarged right-sided mediastinal, hilar and subcarinal lymph nodes are identified compatible with metastatic adenopathy. 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 2. Multiple  small lung nodules are identified bilaterally. The dominant nodule is in the periphery of the right upper lobe. 3. Right adrenal mass, suspicious for metastatic disease. 4. 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 Pathologic fracture involving the lateral aspect of the right fourth rib. 5. Small right pleural effusion with subsegmental atelectasis within the basilar right middle lobe and posterior right lower lobe. Electronically Signed   By: Kerby Moors M.D.   On: 03/20/2022 10:28   CT Head W or Wo Contrast  Result Date: 03/19/2022 CLINICAL DATA:  Intracranial metastatic disease EXAM: CT HEAD WITHOUT AND WITH CONTRAST TECHNIQUE: Contiguous axial images were obtained from the base of the skull through the vertex without and with intravenous contrast. RADIATION DOSE REDUCTION: This exam was performed according to the departmental dose-optimization program which includes automated exposure control, adjustment of the mA and/or kV according to patient size and/or use of iterative reconstruction technique. CONTRAST:  92mL OMNIPAQUE IOHEXOL 350 MG/ML SOLN COMPARISON:  Brain MRI 03/18/2022 FINDINGS: Brain: There is a posterior paramedian extra-axial mass near the torcula herophili with edema of the adjacent left occipital lobe. The intracranial portion of the mass measures 1.6 x  0.9 cm. There are no enhancing intraparenchymal lesions. Vascular: Filling defect at the torcula herophili extending partially into the superior sagittal sinus. Skull: Moth-eaten appearance of the occipital bone at the site of the above-described mass. The mass extends to the extracranial space with a dome-shaped lesion measuring 1.2 x 0.6 cm. Sinuses/Orbits: No fluid levels or advanced mucosal thickening of the visualized paranasal sinuses. No mastoid or middle ear effusion. The orbits are normal. IMPRESSION: 1. Posterior midline extra-axial metastasis extending through  the adjacent occipital calvarium, with a small extra-axial soft tissue component. 2. Edema of the left occipital lobe, adjacent to the above-described mass. 3. Filling defect at the torcula herophili extending partially into the superior sagittal sinus, concerning for tumor invasion. Electronically Signed   By: Ulyses Jarred M.D.   On: 03/19/2022 23:30   MR Brain Wo Contrast  Result Date: 03/18/2022 CLINICAL DATA:  Acute intractable headache.  Nausea and dizziness. EXAM: MRI HEAD WITHOUT CONTRAST TECHNIQUE: Multiplanar, multiecho pulse sequences of the brain and surrounding structures were obtained without intravenous contrast. COMPARISON:  MRI head 04/06/2015 FINDINGS: Brain: Ventricle size and cerebral volume normal. Negative for acute infarct. Mild edema in the left occipital lobe. There is associated occipital bone lesion with extraosseous mass extending intracranially which appears associated with the left occipital lobe edema. No other edema. Vascular: Negative for hyperdense vessel Skull and upper cervical spine: Multiple skull lesions. Largest lesion in the occipital bone posteriorly measures 39 x 26 mm. This contains susceptibility likely due to bony fragmentation in the mass. Smaller lesions are seen in the high parietal bone bilaterally. Central skull base intact. Sinuses/Orbits: Paranasal sinuses clear. Bilateral mastoid effusion. Right cataract extraction Other: None IMPRESSION: 1. Negative for acute infarct. 2. Multiple skull lesions. The largest lesion is in the occipital bone, with adjacent intracranial extension of tumor. The smaller lesions are seen in the high parietal bone bilaterally. 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 3. 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 4. CT head   with contrast recommended for further evaluation. Electronically Signed   By: Franchot Gallo M.D.   On: 03/18/2022 17:19   DG Chest 2  View  Result Date: 03/14/2022 CLINICAL DATA:  Chest pain and shortness of breath EXAM: CHEST - 2 VIEW COMPARISON:  None Available. FINDINGS: The heart size and mediastinal contours are within normal limits. Both lungs are clear. The visualized skeletal structures are unremarkable. IMPRESSION: No acute cardiopulmonary abnormality. Electronically Signed   By: Beryle Flock M.D.   On: 03/14/2022 11:58      IMPRESSION/PLAN: 1. 71 y.o. 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 Today, we talked to the patient and family about the findings and workup thus far. We discussed the natural history of metastatic carcinoma and general treatment, highlighting the role of radiotherapy in the management.  The plan is for bronchoscopy with EBUS on Monday or Tuesday next week so we will await tissue confirmation to help guide our treatment recommendation.  We will order an SRS protocol MRI brain scan to be completed over the weekend to help with treatment planning and will plan to review the imaging at our upcoming multidisciplinary brain conference on Monday, 03/24/2022.  She will also need a PET scan once she is discharged, to complete her disease staging.  The patient and her family were encouraged to ask questions that were answered to their stated satisfaction.   I personally  spent 60 minutes in this encounter including chart review, reviewing radiological studies, meeting face-to-face with the patient, entering orders and completing documentation.    Nicholos Johns, PA-C    Tyler Pita, MD  [facility] Direct Dial: [phone]  Fax: [phone] [email or web address]  Skype  LinkedIn

## 2022-03-21 NOTE — Evaluation (Signed)
Physical Therapy Evaluation Patient Details Name: LEVENIA SKALICKY MRN: [id number] DOB: 1951-06-03 Today's Date: 03/21/2022  History of Present Illness  Patient is 71 y.o. female who presented to ED with history the last 1 or 2 months he has had headaches that have been progressive also weight loss of 10 pounds with half of the dose weight loss coming in the last few days with nausea and vomiting.  Also nonspecific chest pains and progressive shortness of breath in the last 1 or 2 months that is also progressive.  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 MRI of brain note multiple skull lesions, extraosseous extension of tumor in the left occipital lobe with edema in the left occipital lobe. PMH of Anxiety, Arthritis, Depression (01/31/2016), Goiter, Hyperlipidemia, Hypothyroidism, and Seasonal allergies.    Clinical Impression  NARIA ABBEY is 71 y.o. female admitted with above HPI and diagnosis. Patient is currently limited by functional impairments below (see PT problem list). Patient lives with her husband and is independent at baseline and cares for her grandchildren throughout the week. Currently she requires min guard for safety with all mobility, no LOB noted during gait and and pt steady with no AD. VSS with pt SpO2 92% or greater on RA during mobility. Patient will benefit from continued skilled PT interventions to address impairments and progress independence with mobility, anticipate no follow up PT needs when pt is ready for discharge home. Acute PT will follow and progress as able.        Recommendations for follow up therapy are one component of a multi-disciplinary discharge planning process, led by the attending physician.  Recommendations may be updated based on patient  status, additional functional criteria and insurance authorization.  Follow Up Recommendations No PT follow up      Assistance Recommended at Discharge Intermittent Supervision/Assistance  Patient can return home with the following       Equipment Recommendations None recommended by PT  Recommendations for Other Services       Functional Status Assessment Patient has had a recent decline in their functional status and demonstrates the ability to make significant improvements in function in a reasonable and predictable amount of time.     Precautions / Restrictions Precautions Precautions: Fall Restrictions Weight Bearing Restrictions: No      Mobility  Bed Mobility Overal bed mobility: Needs Assistance Bed Mobility: Supine to Sit     Supine to sit: Supervision     General bed mobility comments: pt taking extra time    Transfers Overall transfer level: Needs assistance Equipment used: None Transfers: Sit to/from Stand Sit to Stand: Supervision           General transfer comment: pt using bil UE for power up, no assist needed to rise.    Ambulation/Gait Ambulation/Gait assistance: Min guard, Supervision Gait Distance (Feet): 300 Feet Assistive device: IV Pole, None   Gait velocity: fair     General Gait Details: pt steady throughout, slow pace at start and improved during gait. balance stable with no UE support. VSS wtih HR in 90-100 and SpO2 94% on RA.  Stairs            Wheelchair Mobility    Modified Rankin (Stroke Patients Only)       Balance  Pertinent Vitals/Pain Pain Assessment Pain Assessment: 0-10 Pain Score: 5  Pain Location: headache Pain Descriptors / Indicators: Headache Pain Intervention(s): Limited activity within patient's tolerance, Monitored during session, Repositioned    Home Living Family/patient expects to be discharged to:: Private residence Living  Arrangements: Spouse/significant other Available Help at Discharge: Family Type of Home: House Home Access: Stairs to enter Entrance Stairs-Rails: None Entrance Stairs-Number of Steps: 2+1 or 6 at back with Rt rail   Home Layout: One level Home Equipment: Conservation officer, nature (2 wheels);BSC/3in1;Transport chair;Shower seat      Prior Function Prior Level of Function : Independent/Modified Independent             Mobility Comments: takes care of her grandkids during the week       Hand Dominance   Dominant Hand: Right    Extremity/Trunk Assessment   Upper Extremity Assessment Upper Extremity Assessment: Overall WFL for tasks assessed    Lower Extremity Assessment Lower Extremity Assessment: Overall WFL for tasks assessed    Cervical / Trunk Assessment Cervical / Trunk Assessment: Normal  Communication   Communication: No difficulties  Cognition Arousal/Alertness: Awake/alert Behavior During Therapy: WFL for tasks assessed/performed Overall Cognitive Status: Within Functional Limits for tasks assessed                                          General Comments      Exercises     Assessment/Plan    PT Assessment Patient needs continued PT services  PT Problem List Decreased activity tolerance;Decreased balance;Decreased mobility;Decreased knowledge of use of DME;Decreased safety awareness;Decreased knowledge of precautions       PT Treatment Interventions DME instruction;Gait training;Stair training;Functional mobility training;Therapeutic activities;Therapeutic exercise;Balance training;Patient/family education    PT Goals (Current goals can be found in the Care Plan section)  Acute Rehab PT Goals Patient Stated Goal: get home and back to grandkids PT Goal Formulation: With patient Time For Goal Achievement: 04/04/22 Potential to Achieve Goals: Good    Frequency Min 3X/week     Co-evaluation               AM-PAC PT "6 Clicks"  Mobility  Outcome Measure Help needed turning from your back to your side while in a flat bed without using bedrails?: None Help needed moving from lying on your back to sitting on the side of a flat bed without using bedrails?: None Help needed moving to and from a bed to a chair (including a wheelchair)?: A Little Help needed standing up from a chair using your arms (e.g., wheelchair or bedside chair)?: A Little Help needed to walk in hospital room?: A Little Help needed climbing 3-5 steps with a railing? : A Little 6 Click Score: 20    End of Session Equipment Utilized During Treatment: Gait belt Activity Tolerance: Patient tolerated treatment well Patient left: in chair;with call bell/phone within reach Nurse Communication: Mobility status PT Visit Diagnosis: Other abnormalities of gait and mobility (R26.89);Unsteadiness on feet (R26.81)    Time: 7341-9379 PT Time Calculation (min) (ACUTE ONLY): 19 min   Charges:   PT Evaluation $PT Eval Low Complexity: 1 Low          Verner Mould, DPT Acute Rehabilitation Services Office [phone]  03/21/22 12:36 PM

## 2022-03-21 NOTE — Progress Notes (Signed)
Lorraine Mills   DOB:24-Oct-1950   TD#:[id number]    ASSESSMENT & PLAN:  Diffuse metastatic cancer, unknown primary I have reviewed imaging study myself The source of primary lesion is unknown but due to the pattern of metastatic disease, metastatic lung cancer is suspected I will consult pulmonologist, radiation oncologist and neuro oncologist for evaluation Explained to the patient and her son that we need biopsy to establish diagnosis I will start her on low-dose dexamethasone and IV fluid support She felt a bit better today Ultimately, the patient would like to be treated in [facility] I will reach out to Dr. Janese Banks to reschedule her appointment in the future   Dizziness and headaches Due to intracranial extension of skull lesion I will start her on IV fluid support I suspect it is an element of dehydration due to poor oral intake, improved overnight Recommend PT OT evaluation for safety Awaiting neurooncology input for management of headaches   Goals of care discussion She would likely be hospitalized over the weekend and early next week, pending further investigation   Discharge planning  I recommend inpatient hospitalization due to the severity of her burden of disease I will check on her again next week Please call if questions arise  All questions were answered. The patient knows to call the clinic with any problems, questions or concerns.   The total time spent in the appointment was 30 minutes encounter with patients including review of chart and various tests results, discussions about plan of care and coordination of care plan  Heath Lark, MD 03/21/2022 7:27 AM  Subjective:  She is seen this morning.  Her husband is by the bedside.  She felt that dizziness has improved.  No new neurological deficits.  She has mild persistent headache  Objective:  Vitals:   03/20/22 2112 03/21/22 0520  BP: (!) 155/72 (!) 156/78  Pulse: 96 79  Resp: 18 16  Temp: 97.9 F (36.6 C) 98.7 F  (37.1 C)  SpO2: 97% 98%     Intake/Output Summary (Last 24 hours) at 03/21/2022 0727 Last data filed at 03/21/2022 0557 Gross per 24 hour  Intake 979.37 ml  Output 500 ml  Net 479.37 ml    GENERAL:alert, no distress and comfortable  NEURO: alert & oriented x 3 with fluent speech, no focal motor/sensory deficits   Labs:  Recent Labs    02/05/22 1236 03/14/22 1115 03/18/22 1436 03/19/22 1950 03/21/22 0535  NA 140 138 137 138 139  K 4.0 3.1* 3.8 3.6 3.8  CL 104 104 99 100 103  CO2 25 25 28 26 27   GLUCOSE 79 141* 135* 129* 117*  BUN 17 23 18 16 15   CREATININE 0.77 0.81 0.76 0.88 0.74  CALCIUM 9.8 9.5 9.8 10.2 9.3  GFRNONAA  --  >60  --  >60 >60  PROT 7.2  --   --  7.2  --   ALBUMIN 4.4  --   --  3.9  --   AST 17  --   --  83*  --   ALT 16  --   --  87*  --   ALKPHOS 60  --   --  56  --   BILITOT 0.4  --   --  0.5  --     Studies: I have reviewed the imaging CT CHEST ABDOMEN PELVIS W CONTRAST  Result Date: 03/20/2022 CLINICAL DATA:  Patient presents with multiple calvarial lesions concerning for metastatic disease. EXAM: CT CHEST, ABDOMEN, AND  PELVIS WITH CONTRAST TECHNIQUE: Multidetector CT imaging of the chest, abdomen and pelvis was performed following the standard protocol during bolus administration of intravenous contrast. RADIATION DOSE REDUCTION: This exam was performed according to the departmental dose-optimization program which includes automated exposure control, adjustment of the mA and/or kV according to patient size and/or use of iterative reconstruction technique. CONTRAST:  49mL OMNIPAQUE IOHEXOL 350 MG/ML SOLN COMPARISON:  None Available. FINDINGS: CT CHEST FINDINGS Cardiovascular: Heart size appears normal. No significant pericardial effusion identified. Mediastinum/Nodes: Thyroid gland, trachea and esophagus are unremarkable. Multiple enlarged right mediastinal, multiple enlarged thoracic lymph nodes are identified which are predominantly right-sided.  These include: -right internal mammary lymph node measures 1.1 cm, image 27/4. -right paratracheal lymph node measures 2.3 cm, image 24/4. -subcarinal lymph node measures 1.9 cm, image 30/4. -right hilar node measures 1.7 cm, image 27/4. Lungs/Pleura: Small right pleural effusion identified, image 32/4. Subsegmental atelectasis is identified within the basilar right middle lobe and posterior right lower lobe. Bilateral pulmonary nodules are identified, including: -dominant spiculated nodule within the periphery of the right upper lobe measures 0.9 cm , image 59/5. -Nodule within the anteromedial right upper lobe measures 0.4 cm, image 65/5. Perifissural nodule along the minor fissure measures 3 mm, image 64/5. -anteromedial left upper lobe nodule measures 4 mm, image 63/5. Musculoskeletal: Multifocal lucent bone metastases are noted. -Within the left side of the T11 vertebra there is a lucent bone lesion measuring 1.6 cm, image 106/6. -Within the left lateral T7 vertebra there is a lucent bone lesion measuring 1.1 cm, image 79/5. -Lytic lesion with pathologic fracture involving the lateral aspect of the right fourth rib is identified, image 60/5. CT ABDOMEN PELVIS FINDINGS Hepatobiliary: No focal liver abnormality is seen. No gallstones, gallbladder wall thickening, or biliary dilatation. Pancreas: Unremarkable. No pancreatic ductal dilatation or surrounding inflammatory changes. Spleen: Normal in size without focal abnormality. Adrenals/Urinary Tract: Right adrenal mass measures 3.6 x 2.0 cm, image 52/4. Normal left adrenal gland. No kidney mass or hydronephrosis identified. Urinary bladder is unremarkable. Stomach/Bowel: Stomach appears normal. The appendix is visualized and is within normal limits. No bowel wall thickening, inflammation, or distension. Vascular/Lymphatic: No significant vascular findings are present. No enlarged abdominal or pelvic lymph nodes. Reproductive: Uterus and bilateral adnexa are  unremarkable. Other: No ascites or fluid collection. No peritoneal nodularity identified. Musculoskeletal: Lucent bone lesion within the left iliac wing measures 1.3 cm, image 96/4. Lucent lesion within the right iliac bone adjacent to the SI joint measures 1.1 cm, image 91/4. Status post right hip arthroplasty. IMPRESSION: 1. Enlarged right-sided mediastinal, hilar and subcarinal lymph nodes are identified compatible with metastatic adenopathy. 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 2. Multiple small lung nodules are identified bilaterally. The dominant nodule is in the periphery of the right upper lobe. 3. Right adrenal mass, suspicious for metastatic disease. 4. 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 Pathologic fracture involving the lateral aspect of the right fourth rib. 5. Small right pleural effusion with subsegmental atelectasis within the basilar right middle lobe and posterior right lower lobe. Electronically Signed   By: Kerby Moors M.D.   On: 03/20/2022 10:28   CT Head W or Wo Contrast  Result Date: 03/19/2022 CLINICAL DATA:  Intracranial metastatic disease EXAM: CT HEAD WITHOUT AND WITH CONTRAST TECHNIQUE: Contiguous axial images were obtained from the base of the skull through the vertex without and with intravenous contrast. RADIATION DOSE REDUCTION: This exam was  performed according to the departmental dose-optimization program which includes automated exposure control, adjustment of the mA and/or kV according to patient size and/or use of iterative reconstruction technique. CONTRAST:  63mL OMNIPAQUE IOHEXOL 350 MG/ML SOLN COMPARISON:  Brain MRI 03/18/2022 FINDINGS: Brain: There is a posterior paramedian extra-axial mass near the torcula herophili with edema of the adjacent left occipital lobe. The intracranial portion of the mass measures 1.6 x 0.9 cm. There are no enhancing  intraparenchymal lesions. Vascular: Filling defect at the torcula herophili extending partially into the superior sagittal sinus. Skull: Moth-eaten appearance of the occipital bone at the site of the above-described mass. The mass extends to the extracranial space with a dome-shaped lesion measuring 1.2 x 0.6 cm. Sinuses/Orbits: No fluid levels or advanced mucosal thickening of the visualized paranasal sinuses. No mastoid or middle ear effusion. The orbits are normal. IMPRESSION: 1. 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 2. Edema of the left occipital lobe, adjacent to the above-described mass. 3. Filling defect at the torcula herophili extending partially into the superior sagittal sinus, concerning for tumor invasion. Electronically Signed   By: Ulyses Jarred M.D.   On: 03/19/2022 23:30   MR Brain Wo Contrast  Result Date: 03/18/2022 CLINICAL DATA:  Acute intractable headache.  Nausea and dizziness. EXAM: MRI HEAD WITHOUT CONTRAST TECHNIQUE: Multiplanar, multiecho pulse sequences of the brain and surrounding structures were obtained without intravenous contrast. COMPARISON:  MRI head 04/06/2015 FINDINGS: Brain: Ventricle size and cerebral volume normal. Negative for acute infarct. Mild edema in the left occipital lobe. There is associated occipital bone lesion with extraosseous mass extending intracranially which appears associated with the left occipital lobe edema. No other edema. Vascular: Negative for hyperdense vessel Skull and upper cervical spine: Multiple skull lesions. Largest lesion in the occipital bone posteriorly measures 39 x 26 mm. This contains susceptibility likely due to bony fragmentation in the mass. Smaller lesions are seen in the high parietal bone bilaterally. Central skull base intact. Sinuses/Orbits: Paranasal sinuses clear. Bilateral mastoid effusion. Right cataract extraction Other:  None IMPRESSION: 1. Negative for acute infarct. 2. Multiple skull lesions. The largest lesion is in the occipital bone, with adjacent intracranial extension of tumor. The smaller lesions are seen in the high parietal bone bilaterally. 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 3. 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 4. CT head   with contrast recommended for further evaluation. Electronically Signed   By: Franchot Gallo M.D.   On: 03/18/2022 17:19   DG Chest 2 View  Result Date: 03/14/2022 CLINICAL DATA:  Chest pain and shortness of breath EXAM: CHEST - 2 VIEW COMPARISON:  None Available. FINDINGS: The heart size and mediastinal contours are within normal limits. Both lungs are clear. The visualized skeletal structures are unremarkable. IMPRESSION: No acute cardiopulmonary abnormality. Electronically Signed   By: Beryle Flock M.D.   On: 03/14/2022 11:58

## 2022-03-21 NOTE — Plan of Care (Signed)
  Problem: Education: Goal: Knowledge of General Education information will improve Description Including pain rating scale, medication(s)/side effects and non-pharmacologic comfort measures Outcome: Progressing   Problem: Health Behavior/Discharge Planning: Goal: Ability to manage health-related needs will improve Outcome: Progressing   

## 2022-03-21 NOTE — Evaluation (Signed)
Occupational Therapy Evaluation Patient Details Name: Lorraine Mills MRN: [id number] DOB: October 05, 1950 Today's Date: 03/21/2022   History of Present Illness Patient is 71 y.o. female who presented to ED with history the last 1 or 2 months he has had headaches that have been progressive also weight loss of 10 pounds with half of the dose weight loss coming in the last few days with nausea and vomiting.  Also nonspecific chest pains and progressive shortness of breath in the last 1 or 2 months that is also progressive.  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 MRI of brain note multiple skull lesions, extraosseous extension of tumor in the left occipital lobe with edema in the left occipital lobe. PMH of Anxiety, Arthritis, Depression (01/31/2016), Goiter, Hyperlipidemia, Hypothyroidism, and Seasonal allergies.   Clinical Impression   Patient was found seated in the bedside chair. She performed all assessed tasks without the need for assistance, including lower body dressing, sit to stand, ambulating in her room without an assistive device, and toileting at bathroom level. She reported having slight headache pain, as well as intermittent mild shortness of breath during progressive activity. She was subsequently educated on simple energy conservation strategies to implement as needed during ADLs and/or IADLs in the home. At current, she does not require further OT services, therefore OT will sign off.      Recommendations for follow up therapy are one component of a multi-disciplinary discharge planning process, led by the attending physician.  Recommendations may be updated based on patient status, additional functional criteria and insurance authorization.   Follow Up Recommendations  No OT  follow up    Assistance Recommended at Discharge PRN  Patient can return home with the following Assist for transportation;Assistance with cooking/housework;Help with stairs or ramp for entrance    Functional Status Assessment  Patient has not had a recent decline in their functional status  Equipment Recommendations  None recommended by OT       Precautions / Restrictions Precautions Restrictions Weight Bearing Restrictions: No      Mobility   Transfers Overall transfer level: Independent Equipment used: None Transfers: Sit to/from Stand Sit to Stand: Independent             Balance     Sitting balance-Leahy Scale: Normal       Standing balance-Leahy Scale: Good             ADL either performed or assessed with clinical judgement   ADL Overall ADL's : Independent Eating/Feeding: Independent Eating/Feeding Details (indicate cue type and reason): based on clinical judgement Grooming: Wash/dry hands;Standing;Independent           Upper Body Dressing : Independent Upper Body Dressing Details (indicate cue type and reason): based on clinical judgement Lower Body Dressing: Independent Lower Body Dressing Details (indicate cue type and reason): She performed sock management seated at chair level. Toilet Transfer: Modified Independent;Ambulation   Toileting- Clothing Manipulation and Hygiene: Independent Toileting - Clothing Manipulation Details (indicate cue type and reason): Performed at bathroom level.             Vision   Additional Comments: She denied having acute vision changes or deficits. She correctly read the time depicted on the wall clock.            Pertinent Vitals/Pain Pain Assessment Pain  Assessment: 0-10 Pain Score: 2  Pain Descriptors / Indicators: Headache     Hand Dominance Right   Extremity/Trunk Assessment Upper Extremity Assessment Upper Extremity Assessment: Overall WFL for tasks assessed   Lower Extremity  Assessment Lower Extremity Assessment: Overall WFL for tasks assessed   Cervical / Trunk Assessment Cervical / Trunk Assessment: Normal   Communication Communication Communication: No difficulties   Cognition Arousal/Alertness: Awake/alert Behavior During Therapy: WFL for tasks assessed/performed Overall Cognitive Status: Within Functional Limits for tasks assessed          General Comments: Oriented x4, pleasant, able to follow commands without difficulty     General Comments       Exercises     Shoulder Instructions      Home Living Family/patient expects to be discharged to:: Private residence Living Arrangements: Spouse/significant other Available Help at Discharge: Family Type of Home: House Home Access: Stairs to enter [employer] of Steps: 2 at front or 6 at back with right rail Entrance Stairs-Rails: None Home Layout: One level     Bathroom Shower/Tub: Occupational psychologist: Handicapped height Bathroom Accessibility: Yes   Home Equipment: Conservation officer, nature (2 wheels);BSC/3in1;Shower seat (Lift chair)          Prior Functioning/Environment Prior Level of Function : Independent/Modified Independent             Mobility Comments: Independent ADLs Comments: Independent with ADLs, ambulation, driving, cooking, and cleaning.        OT Problem List: Pain         OT Goals(Current goals can be found in the care plan section) Acute Rehab OT Goals Patient Stated Goal: Good health OT Goal Formulation: With patient  OT Frequency:         AM-PAC OT "6 Clicks" Daily Activity     Outcome Measure Help from another person eating meals?: None Help from another person taking care of personal grooming?: None Help from another person toileting, which includes using toliet, bedpan, or urinal?: None Help from another person bathing (including washing, rinsing, drying)?: None Help from another person to put on and taking off regular upper  body clothing?: None Help from another person to put on and taking off regular lower body clothing?: None 6 Click Score: 24   End of Session Nurse Communication: Mobility status  Activity Tolerance: Patient tolerated treatment well Patient left: in chair;with call bell/phone within reach;with family/visitor present  OT Visit Diagnosis: Pain                Time: 1442-1500 OT Time Calculation (min): 18 min Charges:  OT General Charges $OT Visit: 1 Visit OT Evaluation $OT Eval Low Complexity: 1 Low    Emanual Lamountain L Mccaskill, OTR/L 03/21/2022, 3:18 PM

## 2022-03-21 NOTE — Progress Notes (Signed)
Patient ID: Lorraine Mills, female   DOB: 1951-05-21, 71 y.o.   MRN: [id number] Request received from [facility] for image guided biopsy on pt. Latest imaging studies were reviewed by Dr. Kathlene Cote. He stated that there is no good area for [place] to bx. Adrenal is deep and difficult to get to. Prob bronch EBUS for lymph node sampling best, but she needs an OP PET scan and probably should be presented in Thoracic conf. Could see if [facility] or [place] able to see her as IP, o/w refer to [place] at [facility].

## 2022-03-21 NOTE — TOC Progression Note (Signed)
Transition of Care [facility]) - Progression Note    Patient Details  Name: Lorraine Mills MRN: [id number] Date of Birth: Nov 22, 1950  Transition of Care [facility]) CM/SW [place], RN Phone Number:[phone]  03/21/2022, 8:09 AM  Clinical Narrative:    TOC acknowledges general consult for Home Health / DME Needs. TOC following       Expected Discharge Plan and Services                                                 Social Determinants of Health (SDOH) Interventions    Readmission Risk Interventions     No data to display

## 2022-03-21 NOTE — Consult Note (Signed)
NAME:  Lorraine Mills, MRN:  [id number], DOB:  08-Sep-1950, LOS: 1 ADMISSION DATE:  03/19/2022, CONSULTATION DATE:  03/21/22 REFERRING MD:  History of Present Illness:   71 year old female who is now retired.  Former smoker having quit 23 years ago.  Former 24 pack smoker.  Quite functional with a ECOG of 0.  Has  a new grandchild.  For the last 1 or 2 months he has had headaches that have been progressive also weight loss of 10 pounds with half of the dose weight loss coming in the last few days with nausea and vomiting.  Also nonspecific chest pains and progressive shortness of breath in the last 1 or 2 months that is also progressive.  Prior to that no cough or chills or weight loss.  She has a remote history of seizure but apparently work-up neurologically at that time was noncontributory.  No prior history of COPD or ILD.  Unclear if she is going through lung cancer screening program.   Investigation is reviewed the following imaging and therefore pulmonary has been consulted.  Interventional radiology has deemed medicine adenopathy to be the best place for biopsy.   CT CHEST ABDOMEN PELVIS W CONTRAST IMPRESSION: 1. Enlarged right-sided mediastinal, hilar and subcarinal lymph nodes are identified compatible with metastatic adenopathy. 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  2. Multiple small lung nodules are identified bilaterally. The dominant nodule is in the periphery of the right upper lobe.  3. Right adrenal mass, suspicious for metastatic disease.  4. 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 Pathologic fracture involving the lateral aspect of the right fourth rib.  5. Small right pleural effusion with subsegmental atelectasis within the basilar right middle lobe and posterior right lower lobe. Electronically Signed     MR Brain Wo Contrast IMPRESSION: 1. Negative for acute infarct.  2.  Multiple skull lesions. The largest lesion is in the occipital bone, with adjacent intracranial extension of tumor. The smaller lesions are seen in the high parietal bone bilaterally. 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  3. 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   She has been started on Decadron.  Medical oncology and rad oncology has been consulted.  Past Medical History:    has a past medical history of Anxiety, Arthritis, Depression (01/31/2016), Goiter, Hyperlipidemia, Hypothyroidism, and Seasonal allergies.   reports that she quit smoking about 23 years ago. Her smoking use included cigarettes. She has a 24.00 pack-year smoking history. She has never used smokeless tobacco.  Past Surgical History:  Procedure Laterality Date   ABDOMINAL HYSTERECTOMY     supracervical    BILATERAL OOPHORECTOMY  2001   BENIGN TUMOR   BREAST BIOPSY Left 1985   cyst    CATARACT EXTRACTION Right    COLONOSCOPY  08/17/2009   COLONOSCOPY WITH PROPOFOL N/A 03/19/2016   Procedure: COLONOSCOPY WITH PROPOFOL;  Surgeon: Robert Bellow, MD;  Location: [facility] ENDOSCOPY;  Service: Endoscopy;  Laterality: N/A;   COLONOSCOPY WITH PROPOFOL N/A 05/15/2021   Procedure: COLONOSCOPY WITH PROPOFOL;  Surgeon: Robert Bellow, MD;  Location: [facility] ENDOSCOPY;  Service: Endoscopy;  Laterality: N/A;   CORNEAL TRANSPLANT Right    EYE SURGERY     JOINT REPLACEMENT     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   Procedure: TOTAL HIP ARTHROPLASTY ANTERIOR APPROACH;  Surgeon: Hessie Knows, MD;  Location: [facility]  ORS;  Service: Orthopedics;  Laterality: Right;    Allergies  Allergen Reactions   Latex Rash   Levofloxacin     FLUOROQUINOLONES - unknown reaction    Immunization History  Administered Date(s) Administered   Fluad Quad(high Dose 65+) 03/20/2021   Influenza, High Dose Seasonal PF 03/27/2015   Influenza-Unspecified 04/26/2019, 03/30/2020    Moderna Sars-Covid-2 Vaccination 07/21/2019, 08/18/2019   PNEUMOCOCCAL CONJUGATE-20 02/05/2022   Pneumococcal Conjugate-13 09/24/2017   Pneumococcal Polysaccharide-23 04/17/2020   Tdap 10/07/2012   Zoster Recombinat (Shingrix) 02/24/2020, 05/24/2020    Family History  Problem Relation Age of Onset   Cancer Mother        OVARIAN   Stroke Father    Cancer Sister        breast   Breast cancer Sister 44   Atrial fibrillation Brother    Thyroid disease Other        MULTIPLE FAMILY MEMBERS     Current Facility-Administered Medications:    0.9 %  sodium chloride infusion, , Intravenous, Continuous, Gorsuch, Ni, MD, Last Rate: 75 mL/hr at 03/21/22 0553, New Bag at 03/21/22 0553   acetaminophen (TYLENOL) tablet 650 mg, 650 mg, Oral, Q6H PRN **OR** acetaminophen (TYLENOL) suppository 650 mg, 650 mg, Rectal, Q6H PRN, Karmen Bongo, MD   bisacodyl (DULCOLAX) EC tablet 5 mg, 5 mg, Oral, Daily PRN, Karmen Bongo, MD   cycloSPORINE (RESTASIS) 0.05 % ophthalmic emulsion 1 drop, 1 drop, Both Eyes, BID, Karmen Bongo, MD, 1 drop at 03/21/22 0939   dexamethasone (DECADRON) injection 2 mg, 2 mg, Intravenous, BID, Karmen Bongo, MD, 2 mg at 03/21/22 3875   docusate sodium (COLACE) capsule 100 mg, 100 mg, Oral, BID, Karmen Bongo, MD, 100 mg at 03/21/22 0938   fluticasone (FLONASE) 50 MCG/ACT nasal spray 1-2 spray, 1-2 spray, Each Nare, Daily PRN, Eugenie Filler, MD   hydrALAZINE (APRESOLINE) injection 5 mg, 5 mg, Intravenous, Q4H PRN, Karmen Bongo, MD   levothyroxine (SYNTHROID) tablet 75 mcg, 75 mcg, Oral, QAC breakfast, Karmen Bongo, MD, 75 mcg at 03/21/22 0556   loratadine (CLARITIN) tablet 10 mg, 10 mg, Oral, Daily, Eugenie Filler, MD, 10 mg at 03/21/22 0939   ondansetron (ZOFRAN) tablet 4 mg, 4 mg, Oral, Q6H PRN **OR** ondansetron (ZOFRAN) injection 4 mg, 4 mg, Intravenous, Q6H PRN, Karmen Bongo, MD   pantoprazole (PROTONIX) EC tablet 40 mg, 40 mg, Oral, Daily, Karmen Bongo, MD, 40 mg at 03/21/22 0939   polyethylene glycol (MIRALAX / GLYCOLAX) packet 17 g, 17 g, Oral, Daily PRN, Karmen Bongo, MD   prednisoLONE acetate (PRED FORTE) 1 % ophthalmic suspension 1 drop, 1 drop, Right Eye, Daily, Karmen Bongo, MD, 1 drop at 03/21/22 0939   rosuvastatin (CRESTOR) tablet 10 mg, 10 mg, Oral, Daily, Karmen Bongo, MD, 10 mg at 03/21/22 6433   traZODone (DESYREL) tablet 50 mg, 50 mg, Oral, QHS PRN, Karmen Bongo, MD     Significant Hospital Events:  03/19/2022 - admit   Interim History / Subjective:   03/21/2022 - seen in bed 16 at [facility]  Objective   Blood pressure (!) 156/78, pulse 79, temperature 98.7 F (37.1 C), temperature source Oral, resp. rate 16, height 5\' 1"  (1.549 m), weight 77 kg, SpO2 98 %.        Intake/Output Summary (Last 24 hours) at 03/21/2022 1025 Last data filed at 03/21/2022 0850 Gross per 24 hour  Intake 1219.37 ml  Output 500 ml  Net 719.37 ml   [employer]  03/20/22 1352  Weight: 77 kg    Examination: General: Looks well.  Stable no distress. HENT: No lymphadenopathy in the neck no elevated JVP. Lungs: Clear to auscultation bilaterally no wheezing no crackles.  On 2 L nasal cannula Cardiovascular: Normal heart sounds Abdomen: Soft nontender no organomegaly Extremities: No sinus no clubbing no edema Neuro: Alert and oriented x3.  No focal deficits. Psychology: Appears mildly anxious and tearful GU: Deferred  Resolved Hospital Problem list   X  Assessment & Plan:   Former greater than 20 pack smoking history ECOG 0 Mediastinal adenopathy with small multiple lung nodules associated with brain metastasis and skeletal lesions ?  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  P:   Recommend endobronchial ultrasound with video bronchoscopy without or with  transbronchial biopsies  -Early schedule possible is  Tuesday, 03/25/2022 with Dr. Larey Days at [place] [facility] Get ABG as part of preop evaluation Decadron and rest of the work-up per [facility] hospitalist  Best practice (daily eval):  According to the hospitalist  Patient and husband updated at the bedside      SIGNATURE    Dr. Brand Males, M.D., F.C.C.P,  Pulmonary and Critical Care Medicine Staff Physician, [place] Director - Interstitial Lung Disease  Program  Pulmonary [place] at [place], [place], [address]  NPI Number:  NPI #[id number]  Pager: [phone], If no answer  -> Check AMION or Try [phone] Telephone (clinical office): [phone] Telephone (research): [phone]  10:25 AM 03/21/2022   03/21/2022 10:25 AM    LABS    PULMONARY No results for input(s): "PHART", "PCO2ART", "PO2ART", "HCO3", "TCO2", "O2SAT" in the last 168 hours.  Invalid input(s): "PCO2", "PO2"  CBC Recent Labs  Lab 03/14/22 1115 03/19/22 1950 03/21/22 0535  HGB 14.2 15.3* 14.3  HCT 42.9 45.2 42.5  WBC 12.5* 11.8* 9.9  PLT 281 306 262    COAGULATION No results for input(s): "INR" in the last 168 hours.  CARDIAC  No results for input(s): "TROPONINI" in the last 168 hours. No results for input(s): "PROBNP" in the last 168 hours.  CHEMISTRY Recent Labs  Lab 03/14/22 1115 03/18/22 1436 03/19/22 1950 03/21/22 0535  NA 138 137 138 139  K 3.1* 3.8 3.6 3.8  CL 104 99 100 103  CO2 25 28 26 27   GLUCOSE 141* 135* 129* 117*  BUN 23 18 16 15   CREATININE 0.81 0.76 0.88 0.74  CALCIUM 9.5 9.8 10.2 9.3  MG  --  2.0 2.1  --    Estimated Creatinine Clearance: 61.5 mL/min (by C-G formula based on SCr of 0.74 mg/dL).   LIVER Recent Labs  Lab 03/19/22 1950  AST 83*  ALT 87*  ALKPHOS 56  BILITOT 0.5  PROT 7.2  ALBUMIN 3.9     INFECTIOUS No results for input(s):  "LATICACIDVEN", "PROCALCITON" in the last 168 hours.   ENDOCRINE CBG (last 3)  No results for input(s): "GLUCAP" in the last 72 hours.       IMAGING x48h  - image(s) personally visualized  -   highlighted in bold CT CHEST ABDOMEN PELVIS W CONTRAST  Result Date: 03/20/2022 CLINICAL DATA:  Patient presents with multiple calvarial lesions concerning for metastatic disease. EXAM: CT CHEST, ABDOMEN, AND PELVIS WITH CONTRAST TECHNIQUE: Multidetector CT imaging  of the chest, abdomen and pelvis was performed following the standard protocol during bolus administration of intravenous contrast. RADIATION DOSE REDUCTION: This exam was performed according to the departmental dose-optimization program which includes automated exposure control, adjustment of the mA and/or kV according to patient size and/or use of iterative reconstruction technique. CONTRAST:  86mL OMNIPAQUE IOHEXOL 350 MG/ML SOLN COMPARISON:  None Available. FINDINGS: CT CHEST FINDINGS Cardiovascular: Heart size appears normal. No significant pericardial effusion identified. Mediastinum/Nodes: Thyroid gland, trachea and esophagus are unremarkable. Multiple enlarged right mediastinal, multiple enlarged thoracic lymph nodes are identified which are predominantly right-sided. These include: -right internal mammary lymph node measures 1.1 cm, image 27/4. -right paratracheal lymph node measures 2.3 cm, image 24/4. -subcarinal lymph node measures 1.9 cm, image 30/4. -right hilar node measures 1.7 cm, image 27/4. Lungs/Pleura: Small right pleural effusion identified, image 32/4. Subsegmental atelectasis is identified within the basilar right middle lobe and posterior right lower lobe. Bilateral pulmonary nodules are identified, including: -dominant spiculated nodule within the periphery of the right upper lobe measures 0.9 cm , image 59/5. -Nodule within the anteromedial right upper lobe measures 0.4 cm, image 65/5. Perifissural nodule along the minor  fissure measures 3 mm, image 64/5. -anteromedial left upper lobe nodule measures 4 mm, image 63/5. Musculoskeletal: Multifocal lucent bone metastases are noted. -Within the left side of the T11 vertebra there is a lucent bone lesion measuring 1.6 cm, image 106/6. -Within the left lateral T7 vertebra there is a lucent bone lesion measuring 1.1 cm, image 79/5. -Lytic lesion with pathologic fracture involving the lateral aspect of the right fourth rib is identified, image 60/5. CT ABDOMEN PELVIS FINDINGS Hepatobiliary: No focal liver abnormality is seen. No gallstones, gallbladder wall thickening, or biliary dilatation. Pancreas: Unremarkable. No pancreatic ductal dilatation or surrounding inflammatory changes. Spleen: Normal in size without focal abnormality. Adrenals/Urinary Tract: Right adrenal mass measures 3.6 x 2.0 cm, image 52/4. Normal left adrenal gland. No kidney mass or hydronephrosis identified. Urinary bladder is unremarkable. Stomach/Bowel: Stomach appears normal. The appendix is visualized and is within normal limits. No bowel wall thickening, inflammation, or distension. Vascular/Lymphatic: No significant vascular findings are present. No enlarged abdominal or pelvic lymph nodes. Reproductive: Uterus and bilateral adnexa are unremarkable. Other: No ascites or fluid collection. No peritoneal nodularity identified. Musculoskeletal: Lucent bone lesion within the left iliac wing measures 1.3 cm, image 96/4. Lucent lesion within the right iliac bone adjacent to the SI joint measures 1.1 cm, image 91/4. Status post right hip arthroplasty. IMPRESSION: 1. Enlarged right-sided mediastinal, hilar and subcarinal lymph nodes are identified compatible with metastatic adenopathy. 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 2. Multiple small lung nodules are identified bilaterally. The dominant nodule is in the periphery of the right upper lobe. 3. Right adrenal mass, suspicious for  metastatic disease. 4. 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 Pathologic fracture involving the lateral aspect of the right fourth rib. 5. Small right pleural effusion with subsegmental atelectasis within the basilar right middle lobe and posterior right lower lobe. Electronically Signed   By: Kerby Moors M.D.   On: 03/20/2022 10:28   CT Head W or Wo Contrast  Result Date: 03/19/2022 CLINICAL DATA:  Intracranial metastatic disease EXAM: CT HEAD WITHOUT AND WITH CONTRAST TECHNIQUE: Contiguous axial images were obtained from the base of the skull through the vertex without and with intravenous contrast. RADIATION DOSE REDUCTION: This exam was performed according to the departmental dose-optimization program  which includes automated exposure control, adjustment of the mA and/or kV according to patient size and/or use of iterative reconstruction technique. CONTRAST:  2mL OMNIPAQUE IOHEXOL 350 MG/ML SOLN COMPARISON:  Brain MRI 03/18/2022 FINDINGS: Brain: There is a posterior paramedian extra-axial mass near the torcula herophili with edema of the adjacent left occipital lobe. The intracranial portion of the mass measures 1.6 x 0.9 cm. There are no enhancing intraparenchymal lesions. Vascular: Filling defect at the torcula herophili extending partially into the superior sagittal sinus. Skull: Moth-eaten appearance of the occipital bone at the site of the above-described mass. The mass extends to the extracranial space with a dome-shaped lesion measuring 1.2 x 0.6 cm. Sinuses/Orbits: No fluid levels or advanced mucosal thickening of the visualized paranasal sinuses. No mastoid or middle ear effusion. The orbits are normal. IMPRESSION: 1. 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 2. Edema of the left occipital lobe, adjacent to the above-described  mass. 3. Filling defect at the torcula herophili extending partially into the superior sagittal sinus, concerning for tumor invasion. Electronically Signed   By: Ulyses Jarred M.D.   On: 03/19/2022 23:30

## 2022-03-21 NOTE — Progress Notes (Signed)
PROGRESS NOTE    Lorraine Mills  CXK:[id number] DOB: 16-Sep-1950 DOA: 03/19/2022 PCP: [name], MD    Chief Complaint  Patient presents with   Nausea    Brief Narrative: HPI per [name] is a 71 y.o. female with medical history significant of HLD, hypothyroidism, and depression/anxiety presenting with headache and n/v in the setting of a recently diagnosed brain mass.  "I have lung cancer."  She noticed about a month ago a headache.  She noticed a knot in her skull and then noticed some pain in her chest and SOB maybe 1-2 weeks later and was getting worse.  She went to the walk-in clinic and had a chest pain and she had "air bags" in the bottoms of her lungs.  Last Thursday, the pain was unbearable and she went to the ER at [facility].  She had an MRI and she had lesions on her skull.  She went to her doctor on Tuesday and she ordered a brain MRI and the headaches have gotten worse.  Yesterday, she was having n/v and they finally decided to come to the ER.  She smoked 1ppd for about 24 years.  Some unintentional weight loss.  +night sweats for years, unchanged.  No cough.  SOB comes and goes, sometimes exertional.  No orthopnea.  No PND.  She was told that she had costochondritis causing her R rib pain.  Mother died of ovarian cancer 08-30-2008), sister had breast cancer 31-Aug-2003).         ER Course:  Headaches.  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  Referred for outpatient neurosurg/onc.  Felt awful so came to ER.  CT C/A/P likely lung cancer with diffuse mets.  Neurosurgery does not need involvement.  Onc Alvy Bimler) recommends admission to [facility].  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 -Patient noted to undergone MRI of the brain and noted to have lesions on her skull.  Due to worsening headaches patient presented to the ED. -Patient with mediastinal adenopathy with small multiple lung nodules associated with brain mets, skeletal lesions, extensive former tobacco history with findings concerning for stage IV lung cancer. -Patient seen by oncology who are recommending evaluation by pulmonary for possible bronchoscopy and biopsies in addition to radiation oncology consultation. -Patient seen by pulmonary who are recommending endobronchial ultrasound with video bronchoscopy without with transbronchial biopsies with the earliest schedule possible Tuesday, 03/25/2022 with Dr. Silas Flood. -Continue current regimen of Decadron. -Continue IV fluids. -Oncology following and appreciate input and recommendations.  2.  Dizziness and headaches -Like secondary to metastatic disease due to intracranial extension of skull lesion. -Concern for also dehydration. -Patient with some clinical improvement after being started on IV fluids and IV steroids. -Supportive care.  3.  Hyperlipidemia -Continue statin. -Continue to hold [place] and may resume in the outpatient setting.  4.  Hypothyroidism -Continue home regimen Synthroid.  5.  Depression/anxiety -Continue current regimen trazodone. -May benefit from SSRI treatment however will defer to the outpatient setting with PCP.  6.  Obesity -BMI 32.07kg/m2. -Lifestyle modification. -Outpatient follow-up  with PCP.   DVT prophylaxis: Lovenox Code Status: Full Family Communication: Updated patient no family at bedside. Disposition: Likely home once cleared by oncology, pulmonary, radiation oncology.  Status is: Inpatient Remains inpatient appropriate because: Severity of illness/burden of disease    Consultants:  Pulmonary: Dr. Chase Caller 03/21/2022 Oncology: Dr.Gorsuch 03/20/2022  Procedures:  CT chest abdomen and pelvis 03/20/2022 CT head 03/19/2022  Antimicrobials:  None   Subjective: \Patient sitting up in chair just finished working with physical therapy.  Denies any significant chest pain.  No significant shortness of breath.  States dizziness improvement.  States headache not as severe as it was on presentation.  No abdominal pain.  Objective: Vitals:   03/20/22 1633 03/20/22 2112 03/21/22 0520 03/21/22 1336  BP: (!) 151/72 (!) 155/72 (!) 156/78 (!) 159/71  Pulse: 77 96 79 94  Resp: 18 18 16 18   Temp: 97.9 F (36.6 C) 97.9 F (36.6 C) 98.7 F (37.1 C) 98.4 F (36.9 C)  TempSrc: Oral Oral Oral Oral  SpO2: 96% 97% 98% 96%  Weight:      Height:        Intake/Output Summary (Last 24 hours) at 03/21/2022 1524 Last data filed at 03/21/2022 0850 Gross per 24 hour  Intake 1219.37 ml  Output --  Net 1219.37 ml   Filed Weights   03/20/22 1352  Weight: 77 kg    Examination:  General exam: Appears calm and comfortable  Respiratory system: Clear to auscultation.  No wheezes, no crackles, no rhonchi.  Fair air movement.  Speaking in full sentences.  No use of accessory muscles of respiration.  Cardiovascular system: S1 & S2 heard, RRR. No JVD, murmurs, rubs, gallops or clicks. No pedal edema. Gastrointestinal system: Abdomen is nondistended, soft and nontender. No organomegaly or masses felt. Normal bowel sounds heard. Central nervous system: Alert and oriented. No focal neurological deficits. Extremities: Symmetric 5 x 5 power. Skin: No rashes, lesions or ulcers Psychiatry: Judgement and insight appear normal. Mood & affect appropriate.     Data Reviewed: I have personally reviewed following labs and imaging studies  CBC: Recent Labs  Lab 03/19/22 1950 03/21/22 0535  WBC 11.8* 9.9  NEUTROABS 9.6*  --   HGB 15.3* 14.3  HCT 45.2 42.5  MCV 93.4 94.0   PLT 306 449    Basic Metabolic Panel: Recent Labs  Lab 03/18/22 1436 03/19/22 1950 03/21/22 0535  NA 137 138 139  K 3.8 3.6 3.8  CL 99 100 103  CO2 28 26 27   GLUCOSE 135* 129* 117*  BUN 18 16 15   CREATININE 0.76 0.88 0.74  CALCIUM 9.8 10.2 9.3  MG 2.0 2.1  --     GFR: Estimated Creatinine Clearance: 61.5 mL/min (by C-G formula based on SCr of 0.74 mg/dL).  Liver Function Tests: Recent Labs  Lab 03/19/22 1950  AST 83*  ALT 87*  ALKPHOS 56  BILITOT 0.5  PROT 7.2  ALBUMIN 3.9    CBG: No results for input(s): "GLUCAP" in the last 168 hours.   No results found for this or any previous visit (from the past 240 hour(s)).       Radiology Studies: CT CHEST ABDOMEN PELVIS W CONTRAST  Result Date: 03/20/2022 CLINICAL DATA:  Patient presents with multiple calvarial lesions concerning for metastatic disease. EXAM: CT CHEST, ABDOMEN, AND PELVIS WITH CONTRAST TECHNIQUE: Multidetector CT imaging of the chest, abdomen and pelvis was performed following the standard protocol during bolus administration of intravenous contrast. RADIATION DOSE REDUCTION: This exam  was performed according to the departmental dose-optimization program which includes automated exposure control, adjustment of the mA and/or kV according to patient size and/or use of iterative reconstruction technique. CONTRAST:  39mL OMNIPAQUE IOHEXOL 350 MG/ML SOLN COMPARISON:  None Available. FINDINGS: CT CHEST FINDINGS Cardiovascular: Heart size appears normal. No significant pericardial effusion identified. Mediastinum/Nodes: Thyroid gland, trachea and esophagus are unremarkable. Multiple enlarged right mediastinal, multiple enlarged thoracic lymph nodes are identified which are predominantly right-sided. These include: -right internal mammary lymph node measures 1.1 cm, image 27/4. -right paratracheal lymph node measures 2.3 cm, image 24/4. -subcarinal lymph node measures 1.9 cm, image 30/4. -right hilar node measures  1.7 cm, image 27/4. Lungs/Pleura: Small right pleural effusion identified, image 32/4. Subsegmental atelectasis is identified within the basilar right middle lobe and posterior right lower lobe. Bilateral pulmonary nodules are identified, including: -dominant spiculated nodule within the periphery of the right upper lobe measures 0.9 cm , image 59/5. -Nodule within the anteromedial right upper lobe measures 0.4 cm, image 65/5. Perifissural nodule along the minor fissure measures 3 mm, image 64/5. -anteromedial left upper lobe nodule measures 4 mm, image 63/5. Musculoskeletal: Multifocal lucent bone metastases are noted. -Within the left side of the T11 vertebra there is a lucent bone lesion measuring 1.6 cm, image 106/6. -Within the left lateral T7 vertebra there is a lucent bone lesion measuring 1.1 cm, image 79/5. -Lytic lesion with pathologic fracture involving the lateral aspect of the right fourth rib is identified, image 60/5. CT ABDOMEN PELVIS FINDINGS Hepatobiliary: No focal liver abnormality is seen. No gallstones, gallbladder wall thickening, or biliary dilatation. Pancreas: Unremarkable. No pancreatic ductal dilatation or surrounding inflammatory changes. Spleen: Normal in size without focal abnormality. Adrenals/Urinary Tract: Right adrenal mass measures 3.6 x 2.0 cm, image 52/4. Normal left adrenal gland. No kidney mass or hydronephrosis identified. Urinary bladder is unremarkable. Stomach/Bowel: Stomach appears normal. The appendix is visualized and is within normal limits. No bowel wall thickening, inflammation, or distension. Vascular/Lymphatic: No significant vascular findings are present. No enlarged abdominal or pelvic lymph nodes. Reproductive: Uterus and bilateral adnexa are unremarkable. Other: No ascites or fluid collection. No peritoneal nodularity identified. Musculoskeletal: Lucent bone lesion within the left iliac wing measures 1.3 cm, image 96/4. Lucent lesion within the right iliac bone  adjacent to the SI joint measures 1.1 cm, image 91/4. Status post right hip arthroplasty. IMPRESSION: 1. Enlarged right-sided mediastinal, hilar and subcarinal lymph nodes are identified compatible with metastatic adenopathy. 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 2. Multiple small lung nodules are identified bilaterally. The dominant nodule is in the periphery of the right upper lobe. 3. Right adrenal mass, suspicious for metastatic disease. 4. 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 Pathologic fracture involving the lateral aspect of the right fourth rib. 5. Small right pleural effusion with subsegmental atelectasis within the basilar right middle lobe and posterior right lower lobe. Electronically Signed   By: Kerby Moors M.D.   On: 03/20/2022 10:28   CT Head W or Wo Contrast  Result Date: 03/19/2022 CLINICAL DATA:  Intracranial metastatic disease EXAM: CT HEAD WITHOUT AND WITH CONTRAST TECHNIQUE: Contiguous axial images were obtained from the base of the skull through the vertex without and with intravenous contrast. RADIATION DOSE REDUCTION: This exam was performed according to the departmental dose-optimization program which includes automated exposure control, adjustment of the mA and/or kV according to patient size and/or use of iterative reconstruction technique. CONTRAST:  56mL OMNIPAQUE IOHEXOL 350 MG/ML SOLN COMPARISON:  Brain MRI 03/18/2022 FINDINGS: Brain: There is a posterior paramedian extra-axial mass near the torcula herophili with edema of the adjacent left occipital lobe. The intracranial portion of the mass measures 1.6 x 0.9 cm. There are no enhancing intraparenchymal lesions. Vascular: Filling defect at the torcula herophili extending partially into the superior sagittal sinus. Skull: Moth-eaten appearance of the occipital bone at the site of the above-described mass. The mass extends  to the extracranial space with a dome-shaped lesion measuring 1.2 x 0.6 cm. Sinuses/Orbits: No fluid levels or advanced mucosal thickening of the visualized paranasal sinuses. No mastoid or middle ear effusion. The orbits are normal. IMPRESSION: 1. 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 2. Edema of the left occipital lobe, adjacent to the above-described mass. 3. Filling defect at the torcula herophili extending partially into the superior sagittal sinus, concerning for tumor invasion. Electronically Signed   By: Ulyses Jarred M.D.   On: 03/19/2022 23:30        Scheduled Meds:  cycloSPORINE  1 drop Both Eyes BID   dexamethasone (DECADRON) injection  2 mg Intravenous BID   docusate sodium  100 mg Oral BID   enoxaparin (LOVENOX) injection  30 mg Subcutaneous Q24H   feeding supplement  237 mL Oral Q24H   levothyroxine  75 mcg Oral QAC breakfast   loratadine  10 mg Oral Daily   multivitamin with minerals  1 tablet Oral Daily   pantoprazole  40 mg Oral Daily   prednisoLONE acetate  1 drop Right Eye Daily   rosuvastatin  10 mg Oral Daily   Continuous Infusions:  sodium chloride 75 mL/hr at 03/21/22 0553     LOS: 1 day    Time spent: 40 minutes    Irine Seal, MD [facility]   To contact the attending provider between 7A-7P or the covering provider during after hours 7P-7A, please log into the web site [email or web address] and access using universal [place] password for that web site. If you do not have the password, please call the hospital operator.  03/21/2022, 3:24 PM

## 2022-03-22 ENCOUNTER — Inpatient Hospital Stay (HOSPITAL_COMMUNITY): Payer: PPO

## 2022-03-22 DIAGNOSIS — E785 Hyperlipidemia, unspecified: Secondary | ICD-10-CM | POA: Diagnosis not present

## 2022-03-22 DIAGNOSIS — C799 Secondary malignant neoplasm of unspecified site: Secondary | ICD-10-CM | POA: Diagnosis not present

## 2022-03-22 DIAGNOSIS — E039 Hypothyroidism, unspecified: Secondary | ICD-10-CM | POA: Diagnosis not present

## 2022-03-22 DIAGNOSIS — R9089 Other abnormal findings on diagnostic imaging of central nervous system: Secondary | ICD-10-CM | POA: Diagnosis not present

## 2022-03-22 LAB — CBC
HCT: 42.2 % (ref 36.0–46.0)
Hemoglobin: 14.1 g/dL (ref 12.0–15.0)
MCH: 31.8 pg (ref 26.0–34.0)
MCHC: 33.4 g/dL (ref 30.0–36.0)
MCV: 95 fL (ref 80.0–100.0)
Platelets: 265 10*3/uL (ref 150–400)
RBC: 4.44 MIL/uL (ref 3.87–5.11)
RDW: 11.9 % (ref 11.5–15.5)
WBC: 11.8 10*3/uL — ABNORMAL HIGH (ref 4.0–10.5)
nRBC: 0 % (ref 0.0–0.2)

## 2022-03-22 LAB — BASIC METABOLIC PANEL
Anion gap: 7 (ref 5–15)
BUN: 16 mg/dL (ref 8–23)
CO2: 28 mmol/L (ref 22–32)
Calcium: 9 mg/dL (ref 8.9–10.3)
Chloride: 105 mmol/L (ref 98–111)
Creatinine, Ser: 0.71 mg/dL (ref 0.44–1.00)
GFR, Estimated: >60 mL/min (ref >60)
Glucose, Bld: 108 mg/dL — ABNORMAL HIGH (ref 70–99)
Potassium: 3.6 mmol/L (ref 3.5–5.1)
Sodium: 140 mmol/L (ref 135–145)

## 2022-03-22 LAB — MAGNESIUM: Magnesium: 2.3 mg/dL (ref 1.7–2.4)

## 2022-03-22 MED ORDER — TRAMADOL HCL 50 MG PO TABS
100.0000 mg | ORAL_TABLET | Freq: Four times a day (QID) | ORAL | Status: DC | PRN
  Administered 2022-03-22 – 2022-03-25 (×5): 100 mg via ORAL
  Filled 2022-03-22 (×5): qty 2

## 2022-03-22 MED ORDER — LORAZEPAM 2 MG/ML IJ SOLN
1.0000 mg | Freq: Once | INTRAMUSCULAR | Status: AC
  Administered 2022-03-22: 1 mg via INTRAVENOUS
  Filled 2022-03-22: qty 1

## 2022-03-22 MED ORDER — GADOBUTROL 1 MMOL/ML IV SOLN
7.5000 mL | Freq: Once | INTRAVENOUS | Status: AC | PRN
  Administered 2022-03-22: 7.5 mL via INTRAVENOUS

## 2022-03-22 MED ORDER — PROCHLORPERAZINE EDISYLATE 10 MG/2ML IJ SOLN: 10.0000 mg | Freq: Once | INTRAMUSCULAR | Status: DC

## 2022-03-22 MED ORDER — PROCHLORPERAZINE EDISYLATE 10 MG/2ML IJ SOLN
10.0000 mg | Freq: Four times a day (QID) | INTRAMUSCULAR | Status: DC | PRN
  Administered 2022-03-23 – 2022-03-26 (×9): 10 mg via INTRAVENOUS
  Filled 2022-03-22 (×10): qty 2

## 2022-03-22 MED ORDER — SODIUM CHLORIDE 0.9 % IV SOLN: INTRAVENOUS | Status: AC

## 2022-03-22 MED ORDER — ENOXAPARIN SODIUM 40 MG/0.4ML IJ SOSY
40.0000 mg | PREFILLED_SYRINGE | INTRAMUSCULAR | Status: DC
  Administered 2022-03-22 – 2022-03-26 (×5): 40 mg via SUBCUTANEOUS
  Filled 2022-03-22 (×5): qty 0.4

## 2022-03-22 NOTE — Progress Notes (Signed)
Mobility Specialist - Progress Note   03/22/22 1345  Mobility  Activity Ambulated independently in hallway  Level of Assistance Independent  Assistive Device None  Distance Ambulated (ft) 500 ft  Range of Motion/Exercises Active  Activity Response Tolerated well  Mobility Referral Yes  $Mobility charge 1 Mobility   Pt received in bed and agreeable to mobility. No complaints during mobility. Pt to bathroom after session with all needs met & family in room.  [facility]

## 2022-03-22 NOTE — Progress Notes (Signed)
Mobility Specialist - Progress Note   03/22/22 1036  Mobility  Activity Ambulated independently in hallway  Level of Assistance Independent  Assistive Device None  Distance Ambulated (ft) 500 ft  Activity Response Tolerated well  Mobility Referral Yes  $Mobility charge 1 Mobility   Pt received in bed and agreeable to mobility. Pt c/o SOB during ambulation but stated "it wasn't that bad". Pt to bed after session with all needs met.    [facility]

## 2022-03-22 NOTE — Progress Notes (Signed)
PROGRESS NOTE    Lorraine Mills  OAC:[id number] DOB: Jun 04, 1951 DOA: 03/19/2022 PCP: [name], MD    Chief Complaint  Patient presents with   Nausea    Brief Narrative: HPI per [name] is a 71 y.o. female with medical history significant of HLD, hypothyroidism, and depression/anxiety presenting with headache and n/v in the setting of a recently diagnosed brain mass.  "I have lung cancer."  She noticed about a month ago a headache.  She noticed a knot in her skull and then noticed some pain in her chest and SOB maybe 1-2 weeks later and was getting worse.  She went to the walk-in clinic and had a chest pain and she had "air bags" in the bottoms of her lungs.  Last Thursday, the pain was unbearable and she went to the ER at [facility].  She had an MRI and she had lesions on her skull.  She went to her doctor on Tuesday and she ordered a brain MRI and the headaches have gotten worse.  Yesterday, she was having n/v and they finally decided to come to the ER.  She smoked 1ppd for about 24 years.  Some unintentional weight loss.  +night sweats for years, unchanged.  No cough.  SOB comes and goes, sometimes exertional.  No orthopnea.  No PND.  She was told that she had costochondritis causing her R rib pain.  Mother died of ovarian cancer Sep 07, 2008), sister had breast cancer September 08, 2003).         ER Course:  Headaches.  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  Referred for outpatient neurosurg/onc.  Felt awful so came to ER.  CT C/A/P likely lung cancer with diffuse mets.  Neurosurgery does not need involvement.  Onc Alvy Bimler) recommends admission to [facility].  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 -Patient noted to undergone MRI of the brain and noted to have lesions on her skull.  Due to worsening headaches patient presented to the ED. -Patient with mediastinal adenopathy with small multiple lung nodules associated with brain mets, skeletal lesions, extensive former tobacco history with findings concerning for stage IV lung cancer. -Patient seen by oncology who are recommending evaluation by pulmonary for possible bronchoscopy and biopsies in addition to radiation oncology consultation. -Patient seen by pulmonary who are recommending endobronchial ultrasound with video bronchoscopy without with transbronchial biopsies with the earliest schedule possible Tuesday, 03/25/2022 with Dr. Silas Flood. -Patient also seen by radiation oncology and patient scheduled for [facility] protocol MRI brain scan which per patient was done earlier on this morning. -Continue current regimen of Decadron. -Continue IV fluids. -Oncology following and appreciate input and recommendations.  2.  Dizziness and headaches -Like secondary to metastatic disease due to intracranial extension of skull lesion. -Concern for also dehydration. -Patient with some clinical improvement after being started on IV fluids and IV steroids. -Supportive care.  3.  Hyperlipidemia -Statin. -Continue to hold [place] and may resume in the outpatient setting.  4.  Hypothyroidism -Synthroid.    5.  Depression/anxiety -Continue current regimen trazodone. -May benefit  from SSRI treatment however will defer to the outpatient setting with PCP.  6.  Obesity -BMI 32.07kg/m2. -Lifestyle modification. -Outpatient follow-up with PCP.   DVT prophylaxis: Lovenox Code Status: Full Family Communication: Updated patient and husband at bedside.   Disposition: Likely home once cleared by oncology, pulmonary,  radiation oncology.  Status is: Inpatient Remains inpatient appropriate because: Severity of illness/burden of disease   Consultants:  Pulmonary: Dr. Chase Caller 03/21/2022 Oncology: Dr.Gorsuch 03/20/2022 Radiation oncology: Freeman Caldron, PA  Procedures:  CT chest abdomen and pelvis 03/20/2022 CT head 03/19/2022  Antimicrobials:  None   Subjective: Sitting up in bed.  Husband at bedside.  Denies any chest pain.  No significant shortness of breath.  No abdominal pain.  Stated had a bad headache this morning that has since improved after receiving her IV Decadron.  Stated MRI brain has already been done.  Headache feels a little bit better.  Objective: Vitals:   03/21/22 0520 03/21/22 1336 03/21/22 2107 03/22/22 0421  BP: (!) 156/78 (!) 159/71 (!) 151/74 (!) 155/66  Pulse: 79 94 81 80  Resp: 16 18 18 18   Temp: 98.7 F (37.1 C) 98.4 F (36.9 C) 99.1 F (37.3 C) 98.2 F (36.8 C)  TempSrc: Oral Oral Oral Oral  SpO2: 98% 96% 97% 98%  Weight:      Height:        Intake/Output Summary (Last 24 hours) at 03/22/2022 1003 Last data filed at 03/22/2022 0936 Gross per 24 hour  Intake 120 ml  Output --  Net 120 ml    Filed Weights   03/20/22 1352  Weight: 77 kg    Examination:  General exam: NAD. Respiratory system: CTA B.  No wheezes, no crackles, no rhonchi.  Fair air movement.  Speaking in full sentences.  No use of accessory muscles of respiration.   Cardiovascular system: Regular rate rhythm no murmurs rubs or gallops.  No JVD.  No lower extremity edema. Gastrointestinal system: Abdomen is soft, nontender, nondistended, positive bowel sounds.  No rebound.  No guarding.  Central nervous system: Alert and oriented. No focal neurological deficits. Extremities: Symmetric 5 x 5 power. Skin: No rashes, lesions or ulcers Psychiatry: Judgement and insight appear normal. Mood & affect appropriate.     Data Reviewed: I have personally reviewed following labs and imaging  studies  CBC: Recent Labs  Lab 03/19/22 1950 03/21/22 0535 03/22/22 0511  WBC 11.8* 9.9 11.8*  NEUTROABS 9.6*  --   --   HGB 15.3* 14.3 14.1  HCT 45.2 42.5 42.2  MCV 93.4 94.0 95.0  PLT 306 262 265     Basic Metabolic Panel: Recent Labs  Lab 03/18/22 1436 03/19/22 1950 03/21/22 0535 03/22/22 0511  NA 137 138 139 140  K 3.8 3.6 3.8 3.6  CL 99 100 103 105  CO2 28 26 27 28   GLUCOSE 135* 129* 117* 108*  BUN 18 16 15 16   CREATININE 0.76 0.88 0.74 0.71  CALCIUM 9.8 10.2 9.3 9.0  MG 2.0 2.1  --  2.3     GFR: Estimated Creatinine Clearance: 61.5 mL/min (by C-G formula based on SCr of 0.71 mg/dL).  Liver Function Tests: Recent Labs  Lab 03/19/22 1950  AST 83*  ALT 87*  ALKPHOS 56  BILITOT 0.5  PROT 7.2  ALBUMIN 3.9     CBG: No results for input(s): "GLUCAP" in the last 168 hours.   No results found for this or any previous visit (from the past 240 hour(s)).  Radiology Studies: No results found.      Scheduled Meds:  cycloSPORINE  1 drop Both Eyes BID   dexamethasone (DECADRON) injection  2 mg Intravenous BID   docusate sodium  100 mg Oral BID   enoxaparin (LOVENOX) injection  40 mg Subcutaneous Q24H   feeding supplement  237 mL Oral Q24H   levothyroxine  75 mcg Oral QAC breakfast   loratadine  10 mg Oral Daily   multivitamin with minerals  1 tablet Oral Daily   pantoprazole  40 mg Oral Daily   prednisoLONE acetate  1 drop Right Eye Daily   rosuvastatin  10 mg Oral Daily   Continuous Infusions:  sodium chloride 75 mL/hr at 03/21/22 1947     LOS: 2 days    Time spent: 35 minutes    Irine Seal, MD [facility]   To contact the attending provider between 7A-7P or the covering provider during after hours 7P-7A, please log into the web site [email or web address] and access using universal [place] password for that web site. If you do not have the password, please call the hospital operator.  03/22/2022, 10:03 AM

## 2022-03-23 DIAGNOSIS — C799 Secondary malignant neoplasm of unspecified site: Secondary | ICD-10-CM | POA: Diagnosis not present

## 2022-03-23 DIAGNOSIS — E039 Hypothyroidism, unspecified: Secondary | ICD-10-CM | POA: Diagnosis not present

## 2022-03-23 DIAGNOSIS — R9089 Other abnormal findings on diagnostic imaging of central nervous system: Secondary | ICD-10-CM | POA: Diagnosis not present

## 2022-03-23 DIAGNOSIS — E785 Hyperlipidemia, unspecified: Secondary | ICD-10-CM | POA: Diagnosis not present

## 2022-03-23 MED ORDER — HYDROMORPHONE HCL 1 MG/ML IJ SOLN
0.5000 mg | INTRAMUSCULAR | Status: DC | PRN
  Administered 2022-03-24: 1 mg via INTRAVENOUS
  Filled 2022-03-23: qty 1

## 2022-03-23 NOTE — Progress Notes (Signed)
  Transition of Care [facility]) Screening Note   Patient Details  Name: Lorraine Mills Date of Birth: 10-23-1950   Transition of Care [facility]) CM/SW Contact:    Vassie Moselle, LCSW Phone Number: 03/23/2022, 11:30 AM  TOC consulted for Home Health and DME needs. Currently no needs have been identified.   Transition of Care Department [facility]) has reviewed patient and no TOC needs have been identified at this time. We will continue to monitor patient advancement through interdisciplinary progression rounds. If new patient transition needs arise, please place a TOC consult.

## 2022-03-23 NOTE — Progress Notes (Signed)
Mobility Specialist Cancellation Note:   Reason for Cancellation: Pt declined mobility twice today. Pt feeling nauseous and lightheaded. Will check back as schedule permits.   [place] Specialist Acute Rehabilitation Services Phone: [phone] 03/23/22, 3:46 PM

## 2022-03-23 NOTE — Progress Notes (Signed)
PROGRESS NOTE    Lorraine Mills  VFI:[id number] DOB: 1951-02-21 DOA: 03/19/2022 PCP: [name], MD    Chief Complaint  Patient presents with   Nausea    Brief Narrative: HPI per [name] is a 71 y.o. female with medical history significant of HLD, hypothyroidism, and depression/anxiety presenting with headache and n/v in the setting of a recently diagnosed brain mass.  "I have lung cancer."  She noticed about a month ago a headache.  She noticed a knot in her skull and then noticed some pain in her chest and SOB maybe 1-2 weeks later and was getting worse.  She went to the walk-in clinic and had a chest pain and she had "air bags" in the bottoms of her lungs.  Last Thursday, the pain was unbearable and she went to the ER at [facility].  She had an MRI and she had lesions on her skull.  She went to her doctor on Tuesday and she ordered a brain MRI and the headaches have gotten worse.  Yesterday, she was having n/v and they finally decided to come to the ER.  She smoked 1ppd for about 24 years.  Some unintentional weight loss.  +night sweats for years, unchanged.  No cough.  SOB comes and goes, sometimes exertional.  No orthopnea.  No PND.  She was told that she had costochondritis causing her R rib pain.  Mother died of ovarian cancer 2008/09/15), sister had breast cancer 09-16-03).         ER Course:  Headaches.  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  Referred for outpatient neurosurg/onc.  Felt awful so came to ER.  CT C/A/P likely lung cancer with diffuse mets.  Neurosurgery does not need involvement.  Onc Alvy Bimler) recommends admission to [facility].  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 -Patient noted to undergone MRI of the brain and noted to have lesions on her skull.  Due to worsening headaches patient presented to the ED. -Patient with mediastinal adenopathy with small multiple lung nodules associated with brain mets, skeletal lesions, extensive former tobacco history with findings concerning for stage IV lung cancer. -Patient seen by oncology who recommended evaluation by pulmonary for possible bronchoscopy and biopsies in addition to radiation oncology consultation. -Patient seen by pulmonary who are recommending endobronchial ultrasound with video bronchoscopy without with transbronchial biopsies with the earliest schedule possible Tuesday, 03/25/2022 with Dr. Silas Flood. -Patient also seen by radiation oncology and patient scheduled for [facility] protocol MRI brain scan which per patient was done 03/22/2022. -Continue current regimen of Decadron. -Continue IV fluids. -Oncology following and appreciate input and recommendations.  2.  Dizziness and headaches -Like secondary to metastatic disease due to intracranial extension of skull lesion. -Concern for also dehydration. -Patient with some clinical improvement after being started on IV fluids and IV steroids. -Place on IV Compazine, Ultram as needed headache. -Supportive care.  3.  Hyperlipidemia -Statin. -Continue to hold [place] and may resume in the outpatient setting.  4.  Hypothyroidism -Continue Synthroid.   5.  Depression/anxiety -Continue current  regimen trazodone. -May benefit from SSRI treatment however will defer to the outpatient setting with PCP.  6.  Obesity -BMI 32.07kg/m2. -Lifestyle modification. -Outpatient follow-up with PCP.   DVT prophylaxis: Lovenox Code Status: Full Family Communication: Updated patient and husband and family at bedside.   Disposition: Likely home once cleared by oncology, pulmonary, radiation oncology.  Status is: Inpatient Remains inpatient appropriate because: Severity of illness/burden of disease   Consultants:  Pulmonary: Dr. Chase Caller 03/21/2022 Oncology: Dr.Gorsuch 03/20/2022 Radiation oncology: Freeman Caldron, PA  Procedures:  CT chest abdomen and pelvis 03/20/2022 CT head 03/19/2022 MRI head 03/22/2022  Antimicrobials:  None   Subjective: Patient laying in bed, family at bedside.  Patient with towel on her forehead, ice pack on the top of the head with complaints of severe headache.  Complaining of nausea.  No chest pain.  No shortness of breath.  States just received some medication for headache.    Objective: Vitals:   03/22/22 0421 03/22/22 1405 03/22/22 2026 03/23/22 0521  BP: (!) 155/66 136/61 137/69 (!) 159/90  Pulse: 80 85 82 72  Resp: 18 17 16 16   Temp: 98.2 F (36.8 C) 98.7 F (37.1 C) 98.1 F (36.7 C) 98.4 F (36.9 C)  TempSrc: Oral Oral Oral Oral  SpO2: 98% 97% 98% 93%  Weight:      Height:        Intake/Output Summary (Last 24 hours) at 03/23/2022 1124 Last data filed at 03/23/2022 0300 Gross per 24 hour  Intake 1110.83 ml  Output --  Net 1110.83 ml    Filed Weights   03/20/22 1352  Weight: 77 kg    Examination:  General exam: NAD. Respiratory system: Lungs clear to auscultation bilaterally.  No wheezes, no crackles, no rhonchi.  Fair air movement.  Speaking in full sentences.  No use of accessory muscles of respiration.   Cardiovascular system: RRR no m/r/g.  No JVD.  No lower extremity edema. Gastrointestinal system: Abdomen is soft, nontender, nondistended, positive bowel sounds.  No rebound.  No guarding.  Central nervous system: Alert and oriented. No focal neurological deficits. Extremities: Symmetric 5 x 5 power. Skin: No rashes, lesions or ulcers Psychiatry: Judgement and insight appear normal. Mood & affect appropriate.     Data Reviewed:  I have personally reviewed following labs and imaging studies  CBC: Recent Labs  Lab 03/19/22 1950 03/21/22 0535 03/22/22 0511  WBC 11.8* 9.9 11.8*  NEUTROABS 9.6*  --   --   HGB 15.3* 14.3 14.1  HCT 45.2 42.5 42.2  MCV 93.4 94.0 95.0  PLT 306 262 265     Basic Metabolic Panel: Recent Labs  Lab 03/18/22 1436 03/19/22 1950 03/21/22 0535 03/22/22 0511  NA 137 138 139 140  K 3.8 3.6 3.8 3.6  CL 99 100 103 105  CO2 28 26 27 28   GLUCOSE 135* 129* 117* 108*  BUN 18 16 15 16   CREATININE 0.76 0.88 0.74 0.71  CALCIUM 9.8 10.2 9.3 9.0  MG 2.0 2.1  --  2.3     GFR: Estimated Creatinine Clearance: 61.5 mL/min (by C-G formula based on SCr of 0.71 mg/dL).  Liver Function Tests: Recent Labs  Lab 03/19/22 1950  AST 83*  ALT 87*  ALKPHOS 56  BILITOT 0.5  PROT 7.2  ALBUMIN 3.9     CBG: No results for input(s): "GLUCAP" in the last 168 hours.   No results found for this or any previous visit (from the past 240 hour(s)).  Radiology Studies: MR BRAIN W WO CONTRAST  Result Date: 03/22/2022 CLINICAL DATA:  Follow-up occipital skull lesion. EXAM: MRI HEAD WITHOUT AND WITH CONTRAST TECHNIQUE: Multiplanar, multiecho pulse sequences of the brain and surrounding structures were obtained without and with intravenous contrast. CONTRAST:  7.45mL GADAVIST GADOBUTROL 1 MMOL/ML IV SOLN COMPARISON:  Brain MRI from 4 days ago. FINDINGS: Brain: No evidence of brain metastasis or acute infarct. Mild occipital vasogenic edema on the left adjacent to extra-axial lesion which deforms the brain at this level. No hydrocephalus or collection. Vascular: See below concerning venous structures. Skull and upper cervical spine: Heterogeneously enhancing calvarial mass extending through both inner and outer cortex, up to 3.6 cm on sagittal postcontrast images. There is affect on the dural sinuses at the lower superior sagittal sinus and torcula. A small rightward channel of superior sagittal  sinus is likely patent into the transverse dural sinus on the right. Left-sided transverse sinus filling is less apparent. The torcula is up lifted and narrowed. Two other calvarial metastasis are seen in the parietal bones, on the right with probable extension into the outer cortex and scalp, 18 mm on 17:126. On diffusion imaging there small indeterminate areas of signal abnormality in the right frontal and lower parietal regions as marked on series 5 Sinuses/Orbits: Bilateral mastoid opacification with negative nasopharynx. Right cataract resection. Other: IMPRESSION: 1. Multifocal calvarial metastatic disease greatest in the midline occipital bone where tumor extends through the inner table and invades the lower superior sagittal sinus and torcula. A small channel of patent superior sagittal sinus is seen extending into the dominant right transverse sinus. 2. Left occipital pole vasogenic edema where contacting #1. 3. An 18 mm high right parietal bone metastasis extends through outer cortex. 4. Negative for parenchymal metastasis. Electronically Signed   By: Jorje Guild M.D.   On: 03/22/2022 11:34        Scheduled Meds:  cycloSPORINE  1 drop Both Eyes BID   dexamethasone (DECADRON) injection  2 mg Intravenous BID   docusate sodium  100 mg Oral BID   enoxaparin (LOVENOX) injection  40 mg Subcutaneous Q24H   feeding supplement  237 mL Oral Q24H   levothyroxine  75 mcg Oral QAC breakfast   loratadine  10 mg Oral Daily   multivitamin with minerals  1 tablet Oral Daily   pantoprazole  40 mg Oral Daily   prednisoLONE acetate  1 drop Right Eye Daily   rosuvastatin  10 mg Oral Daily   Continuous Infusions:  sodium chloride 75 mL/hr at 03/23/22 0105     LOS: 3 days    Time spent: 35 minutes    Irine Seal, MD [facility]   To contact the attending provider between 7A-7P or the covering provider during after hours 7P-7A, please log into the web site [email or web address] and access  using universal [place] password for that web site. If you do not have the password, please call the hospital operator.  03/23/2022, 11:24 AM

## 2022-03-24 ENCOUNTER — Ambulatory Visit
Admit: 2022-03-24 | Discharge: 2022-03-24 | Disposition: A | Discharge status: Home / Self Care | Payer: PPO | Attending: Radiation Oncology | Admitting: Radiation Oncology

## 2022-03-24 DIAGNOSIS — K5909 Other constipation: Secondary | ICD-10-CM

## 2022-03-24 DIAGNOSIS — E785 Hyperlipidemia, unspecified: Secondary | ICD-10-CM | POA: Diagnosis not present

## 2022-03-24 DIAGNOSIS — R110 Nausea: Secondary | ICD-10-CM

## 2022-03-24 DIAGNOSIS — C7931 Secondary malignant neoplasm of brain: Secondary | ICD-10-CM | POA: Insufficient documentation

## 2022-03-24 DIAGNOSIS — C799 Secondary malignant neoplasm of unspecified site: Secondary | ICD-10-CM | POA: Diagnosis not present

## 2022-03-24 DIAGNOSIS — R9089 Other abnormal findings on diagnostic imaging of central nervous system: Secondary | ICD-10-CM | POA: Diagnosis not present

## 2022-03-24 DIAGNOSIS — C7951 Secondary malignant neoplasm of bone: Secondary | ICD-10-CM

## 2022-03-24 DIAGNOSIS — E039 Hypothyroidism, unspecified: Secondary | ICD-10-CM | POA: Diagnosis not present

## 2022-03-24 LAB — BASIC METABOLIC PANEL
Anion gap: 6 (ref 5–15)
BUN: 12 mg/dL (ref 8–23)
CO2: 28 mmol/L (ref 22–32)
Calcium: 9.1 mg/dL (ref 8.9–10.3)
Chloride: 104 mmol/L (ref 98–111)
Creatinine, Ser: 0.66 mg/dL (ref 0.44–1.00)
GFR, Estimated: >60 mL/min (ref >60)
Glucose, Bld: 113 mg/dL — ABNORMAL HIGH (ref 70–99)
Potassium: 3.4 mmol/L — ABNORMAL LOW (ref 3.5–5.1)
Sodium: 138 mmol/L (ref 135–145)

## 2022-03-24 LAB — CBC
HCT: 39.6 % (ref 36.0–46.0)
Hemoglobin: 13.3 g/dL (ref 12.0–15.0)
MCH: 32 pg (ref 26.0–34.0)
MCHC: 33.6 g/dL (ref 30.0–36.0)
MCV: 95.4 fL (ref 80.0–100.0)
Platelets: 236 10*3/uL (ref 150–400)
RBC: 4.15 MIL/uL (ref 3.87–5.11)
RDW: 11.8 % (ref 11.5–15.5)
WBC: 12.1 10*3/uL — ABNORMAL HIGH (ref 4.0–10.5)
nRBC: 0 % (ref 0.0–0.2)

## 2022-03-24 LAB — MAGNESIUM: Magnesium: 2.5 mg/dL — ABNORMAL HIGH (ref 1.7–2.4)

## 2022-03-24 MED ORDER — ALBUTEROL SULFATE (2.5 MG/3ML) 0.083% IN NEBU
2.5000 mg | INHALATION_SOLUTION | RESPIRATORY_TRACT | Status: DC | PRN
  Administered 2022-03-24 – 2022-03-25 (×3): 2.5 mg via RESPIRATORY_TRACT
  Filled 2022-03-24 (×3): qty 3

## 2022-03-24 MED ORDER — POLYETHYLENE GLYCOL 3350 17 G PO PACK
17.0000 g | PACK | Freq: Every day | ORAL | Status: DC
  Administered 2022-03-24: 17 g via ORAL
  Filled 2022-03-24 (×2): qty 1

## 2022-03-24 MED ORDER — POTASSIUM CHLORIDE CRYS ER 10 MEQ PO TBCR
40.0000 meq | EXTENDED_RELEASE_TABLET | Freq: Once | ORAL | Status: AC
  Administered 2022-03-24: 40 meq via ORAL
  Filled 2022-03-24: qty 4

## 2022-03-24 MED ORDER — SENNOSIDES-DOCUSATE SODIUM 8.6-50 MG PO TABS
2.0000 | ORAL_TABLET | Freq: Two times a day (BID) | ORAL | Status: DC
  Administered 2022-03-24 – 2022-03-27 (×5): 2 via ORAL
  Filled 2022-03-24 (×6): qty 2

## 2022-03-24 MED ORDER — SODIUM CHLORIDE 0.9 % IV SOLN: INTRAVENOUS | Status: DC

## 2022-03-24 NOTE — Progress Notes (Signed)
Lorraine Mills   DOB:[birth year]   LG#:[id number]    ASSESSMENT & PLAN:  Diffuse metastatic cancer, unknown primary, suspect lung cancer I have reviewed imaging study myself The source of primary lesion is unknown but due to the pattern of metastatic disease, metastatic lung cancer is suspected I have reviewed recommendation from pulmonologist and radiation oncologist Biopsy will be performed tomorrow I will reach out to radiation oncologist to see if radiation can be started while she is hospitalized She is still somewhat symptomatic from dizziness PET CT imaging will be done as an outpatient I am hopeful we can get tissue diagnosis established by the end of the week We discussed the need for infusional chemotherapy in the future She agreed for port placement I will consult interventional radiologist for this   dizziness and headaches Due to intracranial extension of skull lesion Recommend we continue IV steroids and IV fluids Recommend PT OT evaluation for safety Awaiting neurooncology input for management of headaches   Nausea and constipation I recommend scheduled laxatives She has antiemetics to take as needed  Goals of care discussion Unknown, she could be here for the next 3 to 5 days to establish diagnosis and to improve from her symptoms   Discharge planning  I recommend inpatient hospitalization due to the severity of her burden of disease I updated her daughter over the phone as well  All questions were answered. The patient knows to call the clinic with any problems, questions or concerns.   The total time spent in the appointment was 55 minutes encounter with patients including review of chart and various tests results, discussions about plan of care and coordination of care plan  Heath Lark, MD 03/24/2022 8:25 AM  Subjective:  Multiple results were reviewed.  Over the weekend, she had minimum symptomatic improvement She denies new neurological deficits She has  nausea and dizziness She has no bowel movement since admission  Objective:  Vitals:   03/23/22 2352 03/24/22 0533  BP: (!) 153/84 (!) 154/77  Pulse: 79 76  Resp:  14  Temp: 98.5 F (36.9 C) 98.5 F (36.9 C)  SpO2: 97% 96%     Intake/Output Summary (Last 24 hours) at 03/24/2022 0825 Last data filed at 03/23/2022 1703 Gross per 24 hour  Intake 1320.91 ml  Output --  Net 1320.91 ml    GENERAL:alert, no distress and comfortable NEURO: alert & oriented x 3 with fluent speech, no focal motor/sensory deficits   Labs:  Recent Labs    02/05/22 1236 03/14/22 1115 03/19/22 1950 03/21/22 0535 03/22/22 0511 03/24/22 0430  NA 140   < > 138 139 140 138  K 4.0   < > 3.6 3.8 3.6 3.4*  CL 104   < > 100 103 105 104  CO2 25   < > 26 27 28 28   GLUCOSE 79   < > 129* 117* 108* 113*  BUN 17   < > 16 15 16 12   CREATININE 0.77   < > 0.88 0.74 0.71 0.66  CALCIUM 9.8   < > 10.2 9.3 9.0 9.1  GFRNONAA  --    < > >60 >60 >60 >60  PROT 7.2  --  7.2  --   --   --   ALBUMIN 4.4  --  3.9  --   --   --   AST 17  --  83*  --   --   --   ALT 16  --  87*  --   --   --  ALKPHOS 60  --  56  --   --   --   BILITOT 0.4  --  0.5  --   --   --    < > = values in this interval not displayed.    Studies: I have reviewed her MRI MR BRAIN W WO CONTRAST  Result Date: 03/22/2022 CLINICAL DATA:  Follow-up occipital skull lesion. EXAM: MRI HEAD WITHOUT AND WITH CONTRAST TECHNIQUE: Multiplanar, multiecho pulse sequences of the brain and surrounding structures were obtained without and with intravenous contrast. CONTRAST:  7.13mL GADAVIST GADOBUTROL 1 MMOL/ML IV SOLN COMPARISON:  Brain MRI from 4 days ago. FINDINGS: Brain: No evidence of brain metastasis or acute infarct. Mild occipital vasogenic edema on the left adjacent to extra-axial lesion which deforms the brain at this level. No hydrocephalus or collection. Vascular: See below concerning venous structures. Skull and upper cervical spine: Heterogeneously  enhancing calvarial mass extending through both inner and outer cortex, up to 3.6 cm on sagittal postcontrast images. There is affect on the dural sinuses at the lower superior sagittal sinus and torcula. A small rightward channel of superior sagittal sinus is likely patent into the transverse dural sinus on the right. Left-sided transverse sinus filling is less apparent. The torcula is up lifted and narrowed. Two other calvarial metastasis are seen in the parietal bones, on the right with probable extension into the outer cortex and scalp, 18 mm on 17:126. On diffusion imaging there small indeterminate areas of signal abnormality in the right frontal and lower parietal regions as marked on series 5 Sinuses/Orbits: Bilateral mastoid opacification with negative nasopharynx. Right cataract resection. Other: IMPRESSION: 1. Multifocal calvarial metastatic disease greatest in the midline occipital bone where tumor extends through the inner table and invades the lower superior sagittal sinus and torcula. A small channel of patent superior sagittal sinus is seen extending into the dominant right transverse sinus. 2. Left occipital pole vasogenic edema where contacting #1. 3. An 18 mm high right parietal bone metastasis extends through outer cortex. 4. Negative for parenchymal metastasis. Electronically Signed   By: Jorje Guild M.D.   On: 03/22/2022 11:34   CT CHEST ABDOMEN PELVIS W CONTRAST  Result Date: 03/20/2022 CLINICAL DATA:  Patient presents with multiple calvarial lesions concerning for metastatic disease. EXAM: CT CHEST, ABDOMEN, AND PELVIS WITH CONTRAST TECHNIQUE: Multidetector CT imaging of the chest, abdomen and pelvis was performed following the standard protocol during bolus administration of intravenous contrast. RADIATION DOSE REDUCTION: This exam was performed according to the departmental dose-optimization program which includes automated exposure control, adjustment of the mA and/or kV according  to patient size and/or use of iterative reconstruction technique. CONTRAST:  13mL OMNIPAQUE IOHEXOL 350 MG/ML SOLN COMPARISON:  None Available. FINDINGS: CT CHEST FINDINGS Cardiovascular: Heart size appears normal. No significant pericardial effusion identified. Mediastinum/Nodes: Thyroid gland, trachea and esophagus are unremarkable. Multiple enlarged right mediastinal, multiple enlarged thoracic lymph nodes are identified which are predominantly right-sided. These include: -right internal mammary lymph node measures 1.1 cm, image 27/4. -right paratracheal lymph node measures 2.3 cm, image 24/4. -subcarinal lymph node measures 1.9 cm, image 30/4. -right hilar node measures 1.7 cm, image 27/4. Lungs/Pleura: Small right pleural effusion identified, image 32/4. Subsegmental atelectasis is identified within the basilar right middle lobe and posterior right lower lobe. Bilateral pulmonary nodules are identified, including: -dominant spiculated nodule within the periphery of the right upper lobe measures 0.9 cm , image 59/5. -Nodule within the anteromedial right upper lobe measures 0.4 cm, image 65/5.  Perifissural nodule along the minor fissure measures 3 mm, image 64/5. -anteromedial left upper lobe nodule measures 4 mm, image 63/5. Musculoskeletal: Multifocal lucent bone metastases are noted. -Within the left side of the T11 vertebra there is a lucent bone lesion measuring 1.6 cm, image 106/6. -Within the left lateral T7 vertebra there is a lucent bone lesion measuring 1.1 cm, image 79/5. -Lytic lesion with pathologic fracture involving the lateral aspect of the right fourth rib is identified, image 60/5. CT ABDOMEN PELVIS FINDINGS Hepatobiliary: No focal liver abnormality is seen. No gallstones, gallbladder wall thickening, or biliary dilatation. Pancreas: Unremarkable. No pancreatic ductal dilatation or surrounding inflammatory changes. Spleen: Normal in size without focal abnormality. Adrenals/Urinary Tract: Right  adrenal mass measures 3.6 x 2.0 cm, image 52/4. Normal left adrenal gland. No kidney mass or hydronephrosis identified. Urinary bladder is unremarkable. Stomach/Bowel: Stomach appears normal. The appendix is visualized and is within normal limits. No bowel wall thickening, inflammation, or distension. Vascular/Lymphatic: No significant vascular findings are present. No enlarged abdominal or pelvic lymph nodes. Reproductive: Uterus and bilateral adnexa are unremarkable. Other: No ascites or fluid collection. No peritoneal nodularity identified. Musculoskeletal: Lucent bone lesion within the left iliac wing measures 1.3 cm, image 96/4. Lucent lesion within the right iliac bone adjacent to the SI joint measures 1.1 cm, image 91/4. Status post right hip arthroplasty. IMPRESSION: 1. Enlarged right-sided mediastinal, hilar and subcarinal lymph nodes are identified compatible with metastatic adenopathy. 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 2. Multiple small lung nodules are identified bilaterally. The dominant nodule is in the periphery of the right upper lobe. 3. Right adrenal mass, suspicious for metastatic disease. 4. 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 Pathologic fracture involving the lateral aspect of the right fourth rib. 5. Small right pleural effusion with subsegmental atelectasis within the basilar right middle lobe and posterior right lower lobe. Electronically Signed   By: Kerby Moors M.D.   On: 03/20/2022 10:28   CT Head W or Wo Contrast  Result Date: 03/19/2022 CLINICAL DATA:  Intracranial metastatic disease EXAM: CT HEAD WITHOUT AND WITH CONTRAST TECHNIQUE: Contiguous axial images were obtained from the base of the skull through the vertex without and with intravenous contrast. RADIATION DOSE REDUCTION: This exam was performed according to the departmental dose-optimization program which  includes automated exposure control, adjustment of the mA and/or kV according to patient size and/or use of iterative reconstruction technique. CONTRAST:  44mL OMNIPAQUE IOHEXOL 350 MG/ML SOLN COMPARISON:  Brain MRI 03/18/2022 FINDINGS: Brain: There is a posterior paramedian extra-axial mass near the torcula herophili with edema of the adjacent left occipital lobe. The intracranial portion of the mass measures 1.6 x 0.9 cm. There are no enhancing intraparenchymal lesions. Vascular: Filling defect at the torcula herophili extending partially into the superior sagittal sinus. Skull: Moth-eaten appearance of the occipital bone at the site of the above-described mass. The mass extends to the extracranial space with a dome-shaped lesion measuring 1.2 x 0.6 cm. Sinuses/Orbits: No fluid levels or advanced mucosal thickening of the visualized paranasal sinuses. No mastoid or middle ear effusion. The orbits are normal. IMPRESSION: 1. 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 2. Edema of the left occipital lobe, adjacent to the above-described mass. 3. Filling defect at the torcula herophili extending partially into the superior sagittal sinus, concerning for tumor invasion. Electronically Signed   By: Ulyses Jarred  M.D.   On: 03/19/2022 23:30   MR Brain Wo Contrast  Result Date: 03/18/2022 CLINICAL DATA:  Acute intractable headache.  Nausea and dizziness. EXAM: MRI HEAD WITHOUT CONTRAST TECHNIQUE: Multiplanar, multiecho pulse sequences of the brain and surrounding structures were obtained without intravenous contrast. COMPARISON:  MRI head 04/06/2015 FINDINGS: Brain: Ventricle size and cerebral volume normal. Negative for acute infarct. Mild edema in the left occipital lobe. There is associated occipital bone lesion with extraosseous mass extending intracranially which appears associated with the left occipital lobe edema.  No other edema. Vascular: Negative for hyperdense vessel Skull and upper cervical spine: Multiple skull lesions. Largest lesion in the occipital bone posteriorly measures 39 x 26 mm. This contains susceptibility likely due to bony fragmentation in the mass. Smaller lesions are seen in the high parietal bone bilaterally. Central skull base intact. Sinuses/Orbits: Paranasal sinuses clear. Bilateral mastoid effusion. Right cataract extraction Other: None IMPRESSION: 1. Negative for acute infarct. 2. Multiple skull lesions. The largest lesion is in the occipital bone, with adjacent intracranial extension of tumor. The smaller lesions are seen in the high parietal bone bilaterally. 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 3. 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 4. CT head   with contrast recommended for further evaluation. Electronically Signed   By: Franchot Gallo M.D.   On: 03/18/2022 17:19   DG Chest 2 View  Result Date: 03/14/2022 CLINICAL DATA:  Chest pain and shortness of breath EXAM: CHEST - 2 VIEW COMPARISON:  None Available. FINDINGS: The heart size and mediastinal contours are within normal limits. Both lungs are clear. The visualized skeletal structures are unremarkable. IMPRESSION: No acute cardiopulmonary abnormality. Electronically Signed   By: Beryle Flock M.D.   On: 03/14/2022 11:58

## 2022-03-24 NOTE — Progress Notes (Signed)
[facility] Progress Note Patient Name: Lorraine Mills DOB: 07-Oct-1950 MRN: [id number]   Date of Service  03/24/2022  HPI/Events of Note  Patient c/o SOB. Nursing reports the lungs are CTA bilaterally. Currrently on 2 L/min. [name] with sats in high 90's. Nursing request for Neb Rx.   eICU Interventions  Plan: Albuterol 2.5 mg via neb Q 3 hours PRN SOB or wheezing.      Intervention Category Major Interventions: Other:  Lysle Dingwall 03/24/2022, 8:32 PM

## 2022-03-24 NOTE — Progress Notes (Signed)
PROGRESS NOTE    Lorraine Mills  GYJ:[id number] DOB: Mar 14, 1951 DOA: 03/19/2022 PCP: Lorraine Mc, MD    Chief Complaint  Patient presents with   Nausea    Brief Narrative: HPI per Dr. Laren Mills is a 71 y.o. female with medical history significant of HLD, hypothyroidism, and depression/anxiety presenting with headache and n/v in the setting of a recently diagnosed brain mass.  "I have lung cancer."  She noticed about a month ago a headache.  She noticed a knot in her skull and then noticed some pain in her chest and SOB maybe 1-2 weeks later and was getting worse.  She went to the walk-in clinic and had a chest pain and she had "air bags" in the bottoms of her lungs.  Last Thursday, the pain was unbearable and she went to the ER at [facility].  She had an MRI and she had lesions on her skull.  She went to her doctor on Tuesday and she ordered a brain MRI and the headaches have gotten worse.  Yesterday, she was having n/v and they finally decided to come to the ER.  She smoked 1ppd for about 24 years.  Some unintentional weight loss.  +night sweats for years, unchanged.  No cough.  SOB comes and goes, sometimes exertional.  No orthopnea.  No PND.  She was told that she had costochondritis causing her R rib pain.  Mother died of ovarian cancer Aug 21, 2008), sister had breast cancer 08-22-03).         ER Course:  Headaches.  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  Referred for outpatient neurosurg/onc.  Felt awful so came to ER.  CT C/A/P likely lung cancer with diffuse mets.  Neurosurgery does not need involvement.  Onc Alvy Bimler) recommends admission to [facility].  Assessment & Plan:   Principal Problem:   Metastasis of unknown primary (Lorraine Mills) Active Problems:   Hypothyroidism   Dyslipidemia (high LDL; low HDL)   Obesity (BMI 30.0-34.9)   Metastatic primary lung cancer (Lorraine Mills)   Multiple lesions of metastatic malignancy ([place])   Skull mass   Pathologic rib fracture, initial encounter   Other  constipation  #1 diffuse metastatic cancer, unknown primary -Patient presented with headache, nausea and vomiting, dizziness with headaches ongoing for about a month. -Patient noted to undergone MRI of the brain and noted to have lesions on her skull.  Due to worsening headaches patient presented to the ED. -Patient with mediastinal adenopathy with small multiple lung nodules associated with brain mets, skeletal lesions, extensive former tobacco history with findings concerning for stage IV lung cancer. -Patient seen by oncology who recommended evaluation by pulmonary for possible bronchoscopy and biopsies in addition to radiation oncology consultation. -Patient seen by pulmonary who are recommending endobronchial ultrasound with video bronchoscopy without with transbronchial biopsies to be done, 03/25/2022 with Dr. Silas Flood. -Patient also seen by radiation oncology and patient s/p SRS protocol MRI brain scan, 03/22/2022. -Continue current regimen of Decadron. -Continue IV fluids. -Oncology following and appreciate input and recommendations.  2.  Dizziness and headaches -Like secondary to metastatic disease due to intracranial extension of skull lesion. -Concern for also dehydration. -Patient with some clinical improvement after being started on IV fluids and IV steroids. -Continue IV Compazine as needed, Ultram as needed headache. -Placed back on IV fluids as patient with complaints of lightheadedness/dizziness. -Continue current supportive care. -Rad/Onc following, oncology following.  3.  Hyperlipidemia -Statin. -Continue to hold [place] and may resume in the outpatient setting.  4.  Hypothyroidism -Synthroid.   5.  Depression/anxiety -Continue current regimen trazodone as needed. -May benefit from SSRI treatment however will defer to the outpatient setting with PCP.  6.  Obesity -BMI 32.07kg/m2. -Lifestyle modification. -Outpatient follow-up with PCP.   DVT prophylaxis:  Lovenox Code Status: Full Family Communication: Updated patient and husband and son and daughter-in-law at bedside.  Disposition: Likely home once cleared by oncology, pulmonary, radiation oncology.  Likely home on 03/28/2022.  Status is: Inpatient Remains inpatient appropriate because: Severity of illness/burden of disease   Consultants:  Pulmonary: Dr. Chase Caller 03/21/2022 Oncology: Dr.Gorsuch 03/20/2022 Radiation oncology: Freeman Caldron, PA  Procedures:  CT chest abdomen and pelvis 03/20/2022 CT head 03/19/2022 MRI head 03/22/2022  Antimicrobials:  None   Subjective: Laying in bed.  Some headache however not significantly bad this morning per patient.  Complaints of dizziness.  Some nausea.  States she has received antiemetics.  No emesis.  No chest pain.  Husband and son and daughter-in-law at bedside.    Objective: Vitals:   03/23/22 1309 03/23/22 2107 03/23/22 2352 03/24/22 0533  BP: (!) 157/76 (!) 183/82 (!) 153/84 (!) 154/77  Pulse: 66 78 79 76  Resp: 18 14  14   Temp: 98.6 F (37 C) 98.6 F (37 C) 98.5 F (36.9 C) 98.5 F (36.9 C)  TempSrc: Oral Oral Oral Oral  SpO2: 93% 93% 97% 96%  Weight:      Height:        Intake/Output Summary (Last 24 hours) at 03/24/2022 1216 Last data filed at 03/24/2022 1040 Gross per 24 hour  Intake 907.91 ml  Output --  Net 907.91 ml    Filed Weights   03/20/22 1352  Weight: 77 kg    Examination:  General exam: No acute distress. Respiratory system: CTA B anterior lung fields.  No wheezes, no crackles, no rhonchi.  Fair air movement.  Speaking in full sentences.  No use of accessory muscles of respiration.  Cardiovascular system: Regular rate rhythm no murmurs rubs or gallops.  No JVD.  No lower extremity edema.  Gastrointestinal system: Abdomen is soft, nontender, nondistended, positive bowel sounds.  No rebound.  No guarding.   Central nervous system: Alert and oriented. No focal neurological deficits. Extremities:  Symmetric 5 x 5 power. Skin: No rashes, lesions or ulcers Psychiatry: Judgement and insight appear normal. Mood & affect appropriate.     Data Reviewed: I have personally reviewed following labs and imaging studies  CBC: Recent Labs  Lab 03/19/22 1950 03/21/22 0535 03/22/22 0511 03/24/22 0430  WBC 11.8* 9.9 11.8* 12.1*  NEUTROABS 9.6*  --   --   --   HGB 15.3* 14.3 14.1 13.3  HCT 45.2 42.5 42.2 39.6  MCV 93.4 94.0 95.0 95.4  PLT 306 262 265 236     Basic Metabolic Panel: Recent Labs  Lab 03/18/22 1436 03/19/22 1950 03/21/22 0535 03/22/22 0511 03/24/22 0430  NA 137 138 139 140 138  K 3.8 3.6 3.8 3.6 3.4*  CL 99 100 103 105 104  CO2 28 26 27 28 28   GLUCOSE 135* 129* 117* 108* 113*  BUN 18 16 15 16 12   CREATININE 0.76 0.88 0.74 0.71 0.66  CALCIUM 9.8 10.2 9.3 9.0 9.1  MG 2.0 2.1  --  2.3 2.5*     GFR: Estimated Creatinine Clearance: 61.5 mL/min (by C-G formula based on SCr of 0.66 mg/dL).  Liver Function Tests: Recent Labs  Lab 03/19/22 1950  AST 83*  ALT 87*  ALKPHOS 56  BILITOT 0.5  PROT 7.2  ALBUMIN 3.9     CBG: No results for input(s): "GLUCAP" in the last 168 hours.   No results found for this or any previous visit (from the past 240 hour(s)).       Radiology Studies: No results found.      Scheduled Meds:  cycloSPORINE  1 drop Both Eyes BID   dexamethasone (DECADRON) injection  2 mg Intravenous BID   enoxaparin (LOVENOX) injection  40 mg Subcutaneous Q24H   feeding supplement  237 mL Oral Q24H   levothyroxine  75 mcg Oral QAC breakfast   loratadine  10 mg Oral Daily   multivitamin with minerals  1 tablet Oral Daily   pantoprazole  40 mg Oral Daily   polyethylene glycol  17 g Oral Daily   prednisoLONE acetate  1 drop Right Eye Daily   rosuvastatin  10 mg Oral Daily   senna-docusate  2 tablet Oral BID   Continuous Infusions:  sodium chloride       LOS: 4 days    Time spent: 35 minutes    Irine Seal, MD [facility]   To contact the attending provider between 7A-7P or the covering provider during after hours 7P-7A, please log into the web site [email or web address] and access using universal [place] password for that web site. If you do not have the password, please call the hospital operator.  03/24/2022, 12:16 PM

## 2022-03-24 NOTE — Care Management Important Message (Signed)
Important Message  Patient Details IM Letter given to the Patient. Name: Lorraine Mills MRN: [id number] Date of Birth: Oct 11, 1950   Medicare Important Message Given:  Yes     Kerin Salen 03/24/2022, 11:22 AM

## 2022-03-24 NOTE — Consult Note (Signed)
NAME:  Lorraine Mills, MRN:  [id number], DOB:  Jan 19, 1951, LOS: 4 ADMISSION DATE:  03/19/2022, CONSULTATION DATE:  03/21/22 REFERRING MD:  History of Present Illness:   71 year old female who is now retired.  Former smoker having quit 23 years ago.  Former 24 pack smoker.  Quite functional with a ECOG of 0.  Has  a new grandchild.  For the last 1 or 2 months he has had headaches that have been progressive also weight loss of 10 pounds with half of the dose weight loss coming in the last few days with nausea and vomiting.  Also nonspecific chest pains and progressive shortness of breath in the last 1 or 2 months that is also progressive.  Prior to that no cough or chills or weight loss.  She has a remote history of seizure but apparently work-up neurologically at that time was noncontributory.  No prior history of COPD or ILD.  Unclear if she is going through lung cancer screening program.   Investigation is reviewed the following imaging and therefore pulmonary has been consulted.  Interventional radiology has deemed medicine adenopathy to be the best place for biopsy.   CT CHEST ABDOMEN PELVIS W CONTRAST IMPRESSION: 1. Enlarged right-sided mediastinal, hilar and subcarinal lymph nodes are identified compatible with metastatic adenopathy. 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  2. Multiple small lung nodules are identified bilaterally. The dominant nodule is in the periphery of the right upper lobe.  3. Right adrenal mass, suspicious for metastatic disease.  4. 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 Pathologic fracture involving the lateral aspect of the right fourth rib.  5. Small right pleural effusion with subsegmental atelectasis within the basilar right middle lobe and posterior right lower lobe. Electronically Signed     MR Brain Wo Contrast IMPRESSION: 1. Negative for acute infarct.  2.  Multiple skull lesions. The largest lesion is in the occipital bone, with adjacent intracranial extension of tumor. The smaller lesions are seen in the high parietal bone bilaterally. 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  3. 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   She has been started on Decadron.  Medical oncology and rad oncology has been consulted.  Past Medical History:    has a past medical history of Anxiety, Arthritis, Depression (01/31/2016), Goiter, Hyperlipidemia, Hypothyroidism, and Seasonal allergies.   reports that she quit smoking about 23 years ago. Her smoking use included cigarettes. She has a 24.00 pack-year smoking history. She has never used smokeless tobacco.  Past Surgical History:  Procedure Laterality Date   ABDOMINAL HYSTERECTOMY     supracervical    BILATERAL OOPHORECTOMY  2001   BENIGN TUMOR   BREAST BIOPSY Left 1985   cyst    CATARACT EXTRACTION Right    COLONOSCOPY  08/17/2009   COLONOSCOPY WITH PROPOFOL N/A 03/19/2016   Procedure: COLONOSCOPY WITH PROPOFOL;  Surgeon: Robert Bellow, MD;  Location: [facility] ENDOSCOPY;  Service: Endoscopy;  Laterality: N/A;   COLONOSCOPY WITH PROPOFOL N/A 05/15/2021   Procedure: COLONOSCOPY WITH PROPOFOL;  Surgeon: Robert Bellow, MD;  Location: [facility] ENDOSCOPY;  Service: Endoscopy;  Laterality: N/A;   CORNEAL TRANSPLANT Right    EYE SURGERY     JOINT REPLACEMENT     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   Procedure: TOTAL HIP ARTHROPLASTY ANTERIOR APPROACH;  Surgeon: Hessie Knows, MD;  Location: [facility]  ORS;  Service: Orthopedics;  Laterality: Right;    Allergies  Allergen Reactions   Latex Rash   Levofloxacin     FLUOROQUINOLONES - unknown reaction    Immunization History  Administered Date(s) Administered   Fluad Quad(high Dose 65+) 03/20/2021   Influenza, High Dose Seasonal PF 03/27/2015   Influenza-Unspecified 04/26/2019, 03/30/2020    Moderna Sars-Covid-2 Vaccination 07/21/2019, 08/18/2019   PNEUMOCOCCAL CONJUGATE-20 02/05/2022   Pneumococcal Conjugate-13 09/24/2017   Pneumococcal Polysaccharide-23 04/17/2020   Tdap 10/07/2012   Zoster Recombinat (Shingrix) 02/24/2020, 05/24/2020    Family History  Problem Relation Age of Onset   Cancer Mother        OVARIAN   Stroke Father    Cancer Sister        breast   Breast cancer Sister 20   Atrial fibrillation Brother    Thyroid disease Other        MULTIPLE FAMILY MEMBERS     Current Facility-Administered Medications:    acetaminophen (TYLENOL) tablet 650 mg, 650 mg, Oral, Q6H PRN, 650 mg at 03/22/22 0551 **OR** acetaminophen (TYLENOL) suppository 650 mg, 650 mg, Rectal, Q6H PRN, Karmen Bongo, MD   bisacodyl (DULCOLAX) EC tablet 5 mg, 5 mg, Oral, Daily PRN, Karmen Bongo, MD   cycloSPORINE (RESTASIS) 0.05 % ophthalmic emulsion 1 drop, 1 drop, Both Eyes, BID, Karmen Bongo, MD, 1 drop at 03/24/22 6720   dexamethasone (DECADRON) injection 2 mg, 2 mg, Intravenous, BID, Karmen Bongo, MD, 2 mg at 03/24/22 0745   enoxaparin (LOVENOX) injection 40 mg, 40 mg, Subcutaneous, Q24H, Arlyn Dunning M, RPH, 40 mg at 03/23/22 1623   feeding supplement (ENSURE ENLIVE / ENSURE PLUS) liquid 237 mL, 237 mL, Oral, Q24H, Eugenie Filler, MD, 237 mL at 03/23/22 1436   fluticasone (FLONASE) 50 MCG/ACT nasal spray 1-2 spray, 1-2 spray, Each Nare, Daily PRN, Eugenie Filler, MD   hydrALAZINE (APRESOLINE) injection 5 mg, 5 mg, Intravenous, Q4H PRN, Karmen Bongo, MD   HYDROmorphone (DILAUDID) injection 0.5-1 mg, 0.5-1 mg, Intravenous, Q4H PRN, Eugenie Filler, MD, 1 mg at 03/24/22 1105   levothyroxine (SYNTHROID) tablet 75 mcg, 75 mcg, Oral, QAC breakfast, Karmen Bongo, MD, 75 mcg at 03/24/22 0527   loratadine (CLARITIN) tablet 10 mg, 10 mg, Oral, Daily, Eugenie Filler, MD, 10 mg at 03/24/22 0947   multivitamin with minerals tablet 1 tablet, 1 tablet, Oral, Daily,  Eugenie Filler, MD, 1 tablet at 03/24/22 0946   ondansetron (ZOFRAN) tablet 4 mg, 4 mg, Oral, Q6H PRN, 4 mg at 03/24/22 1105 **OR** ondansetron (ZOFRAN) injection 4 mg, 4 mg, Intravenous, Q6H PRN, Karmen Bongo, MD, 4 mg at 03/21/22 1805   pantoprazole (PROTONIX) EC tablet 40 mg, 40 mg, Oral, Daily, Karmen Bongo, MD, 40 mg at 03/24/22 0947   polyethylene glycol (MIRALAX / GLYCOLAX) packet 17 g, 17 g, Oral, Daily, Gorsuch, Ni, MD, 17 g at 03/24/22 0946   prednisoLONE acetate (PRED FORTE) 1 % ophthalmic suspension 1 drop, 1 drop, Right Eye, Daily, Karmen Bongo, MD, 1 drop at 03/24/22 9470   prochlorperazine (COMPAZINE) injection 10 mg, 10 mg, Intravenous, Q6H PRN, Eugenie Filler, MD, 10 mg at 03/24/22 0551   rosuvastatin (CRESTOR) tablet 10 mg, 10 mg, Oral, Daily, Karmen Bongo, MD, 10 mg at 03/24/22 0947   senna-docusate (Senokot-S) tablet 2 tablet, 2 tablet, Oral, BID, Alvy Bimler, Ni, MD, 2 tablet at 03/24/22 0947   traMADol (ULTRAM) tablet 100 mg, 100 mg, Oral, Q6H PRN, Eugenie Filler, MD, 100 mg at  03/24/22 0556   traZODone (DESYREL) tablet 50 mg, 50 mg, Oral, QHS PRN, Karmen Bongo, MD, 50 mg at 03/23/22 2135     Significant Hospital Events:  03/19/2022 - admit   Interim History / Subjective:   No acute events overnight.  On room air.  Discussed plan for bronchoscopy with endobronchial ultrasound [facility] for diagnostic purposes tomorrow.  Objective   Blood pressure (!) 154/77, pulse 76, temperature 98.5 F (36.9 C), temperature source Oral, resp. rate 14, height 5\' 1"  (1.549 m), weight 77 kg, SpO2 96 %.        Intake/Output Summary (Last 24 hours) at 03/24/2022 1120 Last data filed at 03/24/2022 1040 Gross per 24 hour  Intake 967.91 ml  Output --  Net 967.91 ml    Filed Weights   03/20/22 1352  Weight: 77 kg    Examination: General: Looks well.  Stable no distress. HENT: No JVP, supple Lungs: Clear to auscultation bilaterally, normal work of breathing  on room air Cardiovascular: Normal heart sounds Abdomen: Soft nontender no organomegaly Extremities: No sinus no clubbing no edema Neuro: Alert and oriented x3.  No focal deficits. Psychology: Appears mildly anxious and tearful GU: Deferred  Resolved Hospital Problem list   X  Assessment & Plan:   Bulky mediastinal lymphadenopathy with lung nodules and evidence of distant metastasis to the brain suspicious for primary lung cancer: High suspicion for small cell carcinoma.  Discussed role of bronchoscopy.  We will plan to proceed tomorrow, 03/25/2022 with biopsy via bronchoscopy.  Best practice (daily eval):  Per [facility]   SIGNATURE    Lanier Clam, MD See Amion for contact info  03/24/2022 11:20 AM

## 2022-03-24 NOTE — Progress Notes (Signed)
PT Cancellation Note  Patient Details Name: Lorraine Mills MRN: [id number] DOB: 1950-09-26   Cancelled Treatment:     Pt unable to tolerate today with c/o nausea and HA.     Rica Koyanagi  PTA Acute  Rehabilitation Services Office M-F          [phone] Weekend pager [phone]

## 2022-03-24 NOTE — Progress Notes (Signed)
  Radiation Oncology         [phone] ________________________________  Name: Lorraine Mills MRN: [id number]  Date: 03/24/2022  DOB: 1951/03/19  INPATIENT  SIMULATION AND TREATMENT PLANNING NOTE    ICD-10-CM   1. Metastasis to bone (HCC)  C79.51     2. Metastasis to brain [facility])  C79.31       DIAGNOSIS:  71 year old woman with a symptomatic calvarial metastasis invading the parietal brain, suspected right upper lung primary with biopsy pending.  NARRATIVE:  The patient was brought to the [facility].  Identity was confirmed.  All relevant records and images related to the planned course of therapy were reviewed.  The patient freely provided informed written consent to proceed with treatment after reviewing the details related to the planned course of therapy. The consent form was witnessed and verified by the simulation staff. Intravenous access was established for contrast administration. Then, the patient was set-up in a stable reproducible supine position for radiation therapy.  A relocatable thermoplastic stereotactic head frame was fabricated for precise immobilization.  CT images were obtained.  Surface markings were placed.  The CT images were loaded into the planning software and fused with the patient's targeting MRI scan.  Then the target and avoidance structures were contoured.  Treatment planning then occurred.  The radiation prescription was entered and confirmed.  I have requested 3D planning  I have requested a DVH of the following structures: Brain stem, brain, left eye, right eye, lenses, optic chiasm, target volumes, uninvolved brain, and normal tissue.    SPECIAL TREATMENT PROCEDURE:  The planned course of therapy using radiation constitutes a special treatment procedure. Special care is required in the management of this patient for the following reasons. This treatment constitutes a Special Treatment Procedure for the following reason: High dose per  fraction requiring special monitoring for increased toxicities of treatment including daily imaging.  The special nature of the planned course of radiotherapy will require increased physician supervision and oversight to ensure patient's safety with optimal treatment outcomes.  This requires extended time and effort.  PLAN:  The patient will receive 25-30 Gy in 5 fractions.  ________________________________  Sheral Apley Tammi Klippel, M.D.

## 2022-03-25 ENCOUNTER — Inpatient Hospital Stay (HOSPITAL_COMMUNITY): Payer: PPO | Admitting: Certified Registered Nurse Anesthetist

## 2022-03-25 ENCOUNTER — Inpatient Hospital Stay (HOSPITAL_COMMUNITY): Payer: PPO

## 2022-03-25 ENCOUNTER — Encounter (HOSPITAL_COMMUNITY)
Admission: EM | Disposition: A | Discharge status: Home / Self Care | Payer: Self-pay | Source: Home / Self Care | Attending: Internal Medicine

## 2022-03-25 ENCOUNTER — Encounter (HOSPITAL_COMMUNITY): Payer: Self-pay | Admitting: Internal Medicine

## 2022-03-25 DIAGNOSIS — R9089 Other abnormal findings on diagnostic imaging of central nervous system: Secondary | ICD-10-CM | POA: Diagnosis not present

## 2022-03-25 DIAGNOSIS — F418 Other specified anxiety disorders: Secondary | ICD-10-CM | POA: Diagnosis not present

## 2022-03-25 DIAGNOSIS — E785 Hyperlipidemia, unspecified: Secondary | ICD-10-CM | POA: Diagnosis not present

## 2022-03-25 DIAGNOSIS — E039 Hypothyroidism, unspecified: Secondary | ICD-10-CM

## 2022-03-25 DIAGNOSIS — C799 Secondary malignant neoplasm of unspecified site: Secondary | ICD-10-CM | POA: Diagnosis not present

## 2022-03-25 DIAGNOSIS — M1990 Unspecified osteoarthritis, unspecified site: Secondary | ICD-10-CM

## 2022-03-25 DIAGNOSIS — J9859 Other diseases of mediastinum, not elsewhere classified: Secondary | ICD-10-CM

## 2022-03-25 HISTORY — PX: ENDOBRONCHIAL ULTRASOUND: SHX5096

## 2022-03-25 HISTORY — PX: BRONCHIAL NEEDLE ASPIRATION BIOPSY: SHX5106

## 2022-03-25 HISTORY — PX: VIDEO BRONCHOSCOPY: SHX5072

## 2022-03-25 LAB — CBC WITH DIFFERENTIAL/PLATELET
Abs Immature Granulocytes: 0.06 10*3/uL (ref 0.00–0.07)
Basophils Absolute: 0 10*3/uL (ref 0.0–0.1)
Basophils Relative: 0 %
Eosinophils Absolute: 0.1 10*3/uL (ref 0.0–0.5)
Eosinophils Relative: 1 %
HCT: 42.3 % (ref 36.0–46.0)
Hemoglobin: 14.3 g/dL (ref 12.0–15.0)
Immature Granulocytes: 1 %
Lymphocytes Relative: 14 %
Lymphs Abs: 1.7 10*3/uL (ref 0.7–4.0)
MCH: 31.5 pg (ref 26.0–34.0)
MCHC: 33.8 g/dL (ref 30.0–36.0)
MCV: 93.2 fL (ref 80.0–100.0)
Monocytes Absolute: 1.2 10*3/uL — ABNORMAL HIGH (ref 0.1–1.0)
Monocytes Relative: 10 %
Neutro Abs: 9.3 10*3/uL — ABNORMAL HIGH (ref 1.7–7.7)
Neutrophils Relative %: 74 %
Platelets: 244 10*3/uL (ref 150–400)
RBC: 4.54 MIL/uL (ref 3.87–5.11)
RDW: 11.8 % (ref 11.5–15.5)
WBC: 12.4 10*3/uL — ABNORMAL HIGH (ref 4.0–10.5)
nRBC: 0 % (ref 0.0–0.2)

## 2022-03-25 LAB — MAGNESIUM: Magnesium: 2.4 mg/dL (ref 1.7–2.4)

## 2022-03-25 LAB — BASIC METABOLIC PANEL
Anion gap: 9 (ref 5–15)
BUN: 11 mg/dL (ref 8–23)
CO2: 25 mmol/L (ref 22–32)
Calcium: 9.3 mg/dL (ref 8.9–10.3)
Chloride: 104 mmol/L (ref 98–111)
Creatinine, Ser: 0.57 mg/dL (ref 0.44–1.00)
GFR, Estimated: >60 mL/min (ref >60)
Glucose, Bld: 102 mg/dL — ABNORMAL HIGH (ref 70–99)
Potassium: 3.2 mmol/L — ABNORMAL LOW (ref 3.5–5.1)
Sodium: 138 mmol/L (ref 135–145)

## 2022-03-25 LAB — BRAIN NATRIURETIC PEPTIDE: B Natriuretic Peptide: 42 pg/mL (ref 0.0–100.0)

## 2022-03-25 SURGERY — ENDOBRONCHIAL ULTRASOUND (EBUS)
Anesthesia: General

## 2022-03-25 SURGERY — ENDOBRONCHIAL ULTRASOUND (EBUS)
Anesthesia: Monitor Anesthesia Care

## 2022-03-25 MED ORDER — ONDANSETRON HCL 4 MG/2ML IJ SOLN
INTRAMUSCULAR | Status: DC | PRN
  Administered 2022-03-25: 4 mg via INTRAVENOUS

## 2022-03-25 MED ORDER — SUGAMMADEX SODIUM 200 MG/2ML IV SOLN
INTRAVENOUS | Status: DC | PRN
  Administered 2022-03-25: 200 mg via INTRAVENOUS

## 2022-03-25 MED ORDER — POTASSIUM CHLORIDE CRYS ER 20 MEQ PO TBCR
40.0000 meq | EXTENDED_RELEASE_TABLET | ORAL | Status: AC
  Administered 2022-03-25 (×2): 40 meq via ORAL
  Filled 2022-03-25 (×2): qty 2

## 2022-03-25 MED ORDER — ROCURONIUM BROMIDE 10 MG/ML (PF) SYRINGE
PREFILLED_SYRINGE | INTRAVENOUS | Status: DC | PRN
  Administered 2022-03-25: 35 mg via INTRAVENOUS

## 2022-03-25 MED ORDER — DEXAMETHASONE SODIUM PHOSPHATE 10 MG/ML IJ SOLN
INTRAMUSCULAR | Status: DC | PRN
  Administered 2022-03-25: 10 mg via INTRAVENOUS

## 2022-03-25 MED ORDER — HYDROMORPHONE HCL 1 MG/ML IJ SOLN: 0.2500 mg | INTRAMUSCULAR | Status: DC | PRN

## 2022-03-25 MED ORDER — OXYCODONE HCL 5 MG/5ML PO SOLN: 5.0000 mg | Freq: Once | ORAL | Status: DC | PRN

## 2022-03-25 MED ORDER — LACTATED RINGERS IV SOLN
INTRAVENOUS | Status: AC | PRN
  Administered 2022-03-25: 1000 mL via INTRAVENOUS

## 2022-03-25 MED ORDER — SUCCINYLCHOLINE CHLORIDE 200 MG/10ML IV SOSY
PREFILLED_SYRINGE | INTRAVENOUS | Status: DC | PRN
  Administered 2022-03-25: 120 mg via INTRAVENOUS

## 2022-03-25 MED ORDER — AMISULPRIDE (ANTIEMETIC) 5 MG/2ML IV SOLN: 10.0000 mg | Freq: Once | INTRAVENOUS | Status: DC | PRN

## 2022-03-25 MED ORDER — PROPOFOL 10 MG/ML IV BOLUS
INTRAVENOUS | Status: DC | PRN
  Administered 2022-03-25: 120 mg via INTRAVENOUS
  Administered 2022-03-25: 80 mg via INTRAVENOUS

## 2022-03-25 MED ORDER — OXYCODONE HCL 5 MG PO TABS: 5.0000 mg | ORAL_TABLET | Freq: Once | ORAL | Status: DC | PRN

## 2022-03-25 MED ORDER — LACTATED RINGERS IV SOLN: INTRAVENOUS | Status: DC

## 2022-03-25 MED ORDER — FENTANYL CITRATE (PF) 100 MCG/2ML IJ SOLN
INTRAMUSCULAR | Status: AC
  Filled 2022-03-25: qty 2

## 2022-03-25 MED ORDER — FENTANYL CITRATE (PF) 100 MCG/2ML IJ SOLN
INTRAMUSCULAR | Status: DC | PRN
  Administered 2022-03-25 (×2): 50 ug via INTRAVENOUS

## 2022-03-25 MED ORDER — PROMETHAZINE HCL 25 MG/ML IJ SOLN: 6.2500 mg | INTRAMUSCULAR | Status: DC | PRN

## 2022-03-25 MED ORDER — LIDOCAINE 2% (20 MG/ML) 5 ML SYRINGE
INTRAMUSCULAR | Status: DC | PRN
  Administered 2022-03-25: 100 mg via INTRAVENOUS

## 2022-03-25 NOTE — Progress Notes (Signed)
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 I have reviewed imaging study myself The source of primary lesion is unknown but due to the pattern of metastatic disease, metastatic lung cancer is suspected I have reviewed recommendation from pulmonologist and radiation oncologist Biopsy will be performed today She had radiation simulation yesterday, plan will be to start treatment on Wednesday, total 5 fractions She is still somewhat symptomatic from dizziness PET CT imaging will be done as an outpatient I am hopeful we can get tissue diagnosis established by the end of the week We discussed the need for infusional chemotherapy in the future She agreed for port placement I will consult interventional radiologist for this    dizziness and headaches Due to intracranial extension of skull lesion Recommend we continue IV steroids and IV fluids Recommend PT OT evaluation for safety   Nausea and constipation I recommend scheduled laxatives She has antiemetics to take as needed   Goals of care discussion Unknown, she could be here for the next 3 to 5 days to establish diagnosis and to improve from her symptoms   Discharge planning  I recommend inpatient hospitalization due to the severity of her burden of disease I updated her daughter over the phone as well All questions were answered. The patient knows to call the clinic with any problems, questions or concerns.   The total time spent in the appointment was 40 minutes encounter with patients including review of chart and various tests results, discussions about plan of care and coordination of care plan  Heath Lark, MD 03/25/2022 8:22 AM  Subjective:  The patient has subjective dizziness throughout yesterday and did not participate much with physical therapy Her husband and 2 sisters are by the bedside and was updated her daughter over the  phone She has no bowel movement for almost a week now She had nausea but no vomiting  Objective:  Vitals:   03/24/22 1956 03/25/22 0419  BP: (!) 173/97 139/73  Pulse: 79 83  Resp: 18 14  Temp: (!) 97.5 F (36.4 C) 97.6 F (36.4 C)  SpO2: 97% 99%     Intake/Output Summary (Last 24 hours) at 03/25/2022 0272 Last data filed at 03/25/2022 0300 Gross per 24 hour  Intake 1493.1 ml  Output --  Net 1493.1 ml    GENERAL:alert, no distress and comfortable  NEURO: alert & oriented x 3 with fluent speech, no focal motor/sensory deficits   Labs:  Recent Labs    02/05/22 1236 03/14/22 1115 03/19/22 1950 03/21/22 0535 03/22/22 0511 03/24/22 0430 03/25/22 0602  NA 140   < > 138   < > 140 138 138  K 4.0   < > 3.6   < > 3.6 3.4* 3.2*  CL 104   < > 100   < > 105 104 104  CO2 25   < > 26   < > 28 28 25   GLUCOSE 79   < > 129*   < > 108* 113* 102*  BUN 17   < > 16   < > 16 12 11   CREATININE 0.77   < > 0.88   < > 0.71 0.66 0.57  CALCIUM 9.8   < > 10.2   < > 9.0 9.1 9.3  GFRNONAA  --    < > >60   < > >60 >60 >60  PROT 7.2  --  7.2  --   --   --   --  ALBUMIN 4.4  --  3.9  --   --   --   --   AST 17  --  83*  --   --   --   --   ALT 16  --  87*  --   --   --   --   ALKPHOS 60  --  56  --   --   --   --   BILITOT 0.4  --  0.5  --   --   --   --    < > = values in this interval not displayed.    Studies: I reviewed her imaging studies MR BRAIN W WO CONTRAST  Result Date: 03/22/2022 CLINICAL DATA:  Follow-up occipital skull lesion. EXAM: MRI HEAD WITHOUT AND WITH CONTRAST TECHNIQUE: Multiplanar, multiecho pulse sequences of the brain and surrounding structures were obtained without and with intravenous contrast. CONTRAST:  7.55mL GADAVIST GADOBUTROL 1 MMOL/ML IV SOLN COMPARISON:  Brain MRI from 4 days ago. FINDINGS: Brain: No evidence of brain metastasis or acute infarct. Mild occipital vasogenic edema on the left adjacent to extra-axial lesion which deforms the brain at this level.  No hydrocephalus or collection. Vascular: See below concerning venous structures. Skull and upper cervical spine: Heterogeneously enhancing calvarial mass extending through both inner and outer cortex, up to 3.6 cm on sagittal postcontrast images. There is affect on the dural sinuses at the lower superior sagittal sinus and torcula. A small rightward channel of superior sagittal sinus is likely patent into the transverse dural sinus on the right. Left-sided transverse sinus filling is less apparent. The torcula is up lifted and narrowed. Two other calvarial metastasis are seen in the parietal bones, on the right with probable extension into the outer cortex and scalp, 18 mm on 17:126. On diffusion imaging there small indeterminate areas of signal abnormality in the right frontal and lower parietal regions as marked on series 5 Sinuses/Orbits: Bilateral mastoid opacification with negative nasopharynx. Right cataract resection. Other: IMPRESSION: 1. Multifocal calvarial metastatic disease greatest in the midline occipital bone where tumor extends through the inner table and invades the lower superior sagittal sinus and torcula. A small channel of patent superior sagittal sinus is seen extending into the dominant right transverse sinus. 2. Left occipital pole vasogenic edema where contacting #1. 3. An 18 mm high right parietal bone metastasis extends through outer cortex. 4. Negative for parenchymal metastasis. Electronically Signed   By: Jorje Guild M.D.   On: 03/22/2022 11:34   CT CHEST ABDOMEN PELVIS W CONTRAST  Result Date: 03/20/2022 CLINICAL DATA:  Patient presents with multiple calvarial lesions concerning for metastatic disease. EXAM: CT CHEST, ABDOMEN, AND PELVIS WITH CONTRAST TECHNIQUE: Multidetector CT imaging of the chest, abdomen and pelvis was performed following the standard protocol during bolus administration of intravenous contrast. RADIATION DOSE REDUCTION: This exam was performed according  to the departmental dose-optimization program which includes automated exposure control, adjustment of the mA and/or kV according to patient size and/or use of iterative reconstruction technique. CONTRAST:  22mL OMNIPAQUE IOHEXOL 350 MG/ML SOLN COMPARISON:  None Available. FINDINGS: CT CHEST FINDINGS Cardiovascular: Heart size appears normal. No significant pericardial effusion identified. Mediastinum/Nodes: Thyroid gland, trachea and esophagus are unremarkable. Multiple enlarged right mediastinal, multiple enlarged thoracic lymph nodes are identified which are predominantly right-sided. These include: -right internal mammary lymph node measures 1.1 cm, image 27/4. -right paratracheal lymph node measures 2.3 cm, image 24/4. -subcarinal lymph node measures 1.9 cm, image 30/4. -right hilar node  measures 1.7 cm, image 27/4. Lungs/Pleura: Small right pleural effusion identified, image 32/4. Subsegmental atelectasis is identified within the basilar right middle lobe and posterior right lower lobe. Bilateral pulmonary nodules are identified, including: -dominant spiculated nodule within the periphery of the right upper lobe measures 0.9 cm , image 59/5. -Nodule within the anteromedial right upper lobe measures 0.4 cm, image 65/5. Perifissural nodule along the minor fissure measures 3 mm, image 64/5. -anteromedial left upper lobe nodule measures 4 mm, image 63/5. Musculoskeletal: Multifocal lucent bone metastases are noted. -Within the left side of the T11 vertebra there is a lucent bone lesion measuring 1.6 cm, image 106/6. -Within the left lateral T7 vertebra there is a lucent bone lesion measuring 1.1 cm, image 79/5. -Lytic lesion with pathologic fracture involving the lateral aspect of the right fourth rib is identified, image 60/5. CT ABDOMEN PELVIS FINDINGS Hepatobiliary: No focal liver abnormality is seen. No gallstones, gallbladder wall thickening, or biliary dilatation. Pancreas: Unremarkable. No pancreatic ductal  dilatation or surrounding inflammatory changes. Spleen: Normal in size without focal abnormality. Adrenals/Urinary Tract: Right adrenal mass measures 3.6 x 2.0 cm, image 52/4. Normal left adrenal gland. No kidney mass or hydronephrosis identified. Urinary bladder is unremarkable. Stomach/Bowel: Stomach appears normal. The appendix is visualized and is within normal limits. No bowel wall thickening, inflammation, or distension. Vascular/Lymphatic: No significant vascular findings are present. No enlarged abdominal or pelvic lymph nodes. Reproductive: Uterus and bilateral adnexa are unremarkable. Other: No ascites or fluid collection. No peritoneal nodularity identified. Musculoskeletal: Lucent bone lesion within the left iliac wing measures 1.3 cm, image 96/4. Lucent lesion within the right iliac bone adjacent to the SI joint measures 1.1 cm, image 91/4. Status post right hip arthroplasty. IMPRESSION: 1. Enlarged right-sided mediastinal, hilar and subcarinal lymph nodes are identified compatible with metastatic adenopathy. 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 2. Multiple small lung nodules are identified bilaterally. The dominant nodule is in the periphery of the right upper lobe. 3. Right adrenal mass, suspicious for metastatic disease. 4. 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 Pathologic fracture involving the lateral aspect of the right fourth rib. 5. Small right pleural effusion with subsegmental atelectasis within the basilar right middle lobe and posterior right lower lobe. Electronically Signed   By: Kerby Moors M.D.   On: 03/20/2022 10:28   CT Head W or Wo Contrast  Result Date: 03/19/2022 CLINICAL DATA:  Intracranial metastatic disease EXAM: CT HEAD WITHOUT AND WITH CONTRAST TECHNIQUE: Contiguous axial images were obtained from the base of the skull through the vertex without and with intravenous  contrast. RADIATION DOSE REDUCTION: This exam was performed according to the departmental dose-optimization program which includes automated exposure control, adjustment of the mA and/or kV according to patient size and/or use of iterative reconstruction technique. CONTRAST:  39mL OMNIPAQUE IOHEXOL 350 MG/ML SOLN COMPARISON:  Brain MRI 03/18/2022 FINDINGS: Brain: There is a posterior paramedian extra-axial mass near the torcula herophili with edema of the adjacent left occipital lobe. The intracranial portion of the mass measures 1.6 x 0.9 cm. There are no enhancing intraparenchymal lesions. Vascular: Filling defect at the torcula herophili extending partially into the superior sagittal sinus. Skull: Moth-eaten appearance of the occipital bone at the site of the above-described mass. The mass extends to the extracranial space with a dome-shaped lesion measuring 1.2 x 0.6 cm. Sinuses/Orbits: No fluid levels or advanced mucosal thickening of the visualized paranasal sinuses. No mastoid or middle  ear effusion. The orbits are normal. IMPRESSION: 1. 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 2. Edema of the left occipital lobe, adjacent to the above-described mass. 3. Filling defect at the torcula herophili extending partially into the superior sagittal sinus, concerning for tumor invasion. Electronically Signed   By: Ulyses Jarred M.D.   On: 03/19/2022 23:30   MR Brain Wo Contrast  Result Date: 03/18/2022 CLINICAL DATA:  Acute intractable headache.  Nausea and dizziness. EXAM: MRI HEAD WITHOUT CONTRAST TECHNIQUE: Multiplanar, multiecho pulse sequences of the brain and surrounding structures were obtained without intravenous contrast. COMPARISON:  MRI head 04/06/2015 FINDINGS: Brain: Ventricle size and cerebral volume normal. Negative for acute infarct. Mild edema in the left occipital lobe. There is associated  occipital bone lesion with extraosseous mass extending intracranially which appears associated with the left occipital lobe edema. No other edema. Vascular: Negative for hyperdense vessel Skull and upper cervical spine: Multiple skull lesions. Largest lesion in the occipital bone posteriorly measures 39 x 26 mm. This contains susceptibility likely due to bony fragmentation in the mass. Smaller lesions are seen in the high parietal bone bilaterally. Central skull base intact. Sinuses/Orbits: Paranasal sinuses clear. Bilateral mastoid effusion. Right cataract extraction Other: None IMPRESSION: 1. Negative for acute infarct. 2. Multiple skull lesions. The largest lesion is in the occipital bone, with adjacent intracranial extension of tumor. The smaller lesions are seen in the high parietal bone bilaterally. 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 3. 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 4. CT head   with contrast recommended for further evaluation. Electronically Signed   By: Franchot Gallo M.D.   On: 03/18/2022 17:19   DG Chest 2 View  Result Date: 03/14/2022 CLINICAL DATA:  Chest pain and shortness of breath EXAM: CHEST - 2 VIEW COMPARISON:  None Available. FINDINGS: The heart size and mediastinal contours are within normal limits. Both lungs are clear. The visualized skeletal structures are unremarkable. IMPRESSION: No acute cardiopulmonary abnormality. Electronically Signed   By: Beryle Flock M.D.   On: 03/14/2022 11:58

## 2022-03-25 NOTE — Progress Notes (Signed)
NAME:  Lorraine Mills, MRN:  [id number], DOB:  1950-11-12, LOS: 5 ADMISSION DATE:  03/19/2022, CONSULTATION DATE:  03/21/22 REFERRING MD:  History of Present Illness:   71 year old female who is now retired.  Former smoker having quit 23 years ago.  Former 24 pack smoker.  Quite functional with a ECOG of 0.  Has  a new grandchild.  For the last 1 or 2 months he has had headaches that have been progressive also weight loss of 10 pounds with half of the dose weight loss coming in the last few days with nausea and vomiting.  Also nonspecific chest pains and progressive shortness of breath in the last 1 or 2 months that is also progressive.  Prior to that no cough or chills or weight loss.  She has a remote history of seizure but apparently work-up neurologically at that time was noncontributory.  No prior history of COPD or ILD.  Unclear if she is going through lung cancer screening program.   Investigation is reviewed the following imaging and therefore pulmonary has been consulted.  Interventional radiology has deemed medicine adenopathy to be the best place for biopsy.   CT CHEST ABDOMEN PELVIS W CONTRAST IMPRESSION: 1. Enlarged right-sided mediastinal, hilar and subcarinal lymph nodes are identified compatible with metastatic adenopathy. 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  2. Multiple small lung nodules are identified bilaterally. The dominant nodule is in the periphery of the right upper lobe.  3. Right adrenal mass, suspicious for metastatic disease.  4. 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 Pathologic fracture involving the lateral aspect of the right fourth rib.  5. Small right pleural effusion with subsegmental atelectasis within the basilar right middle lobe and posterior right lower lobe. Electronically Signed     MR Brain Wo Contrast IMPRESSION: 1. Negative for acute infarct.  2.  Multiple skull lesions. The largest lesion is in the occipital bone, with adjacent intracranial extension of tumor. The smaller lesions are seen in the high parietal bone bilaterally. 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  3. 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   She has been started on Decadron.  Medical oncology and rad oncology has been consulted.  Past Medical History:    has a past medical history of Anxiety, Arthritis, Depression (01/31/2016), Goiter, Hyperlipidemia, Hypothyroidism, and Seasonal allergies.   reports that she quit smoking about 23 years ago. Her smoking use included cigarettes. She has a 24.00 pack-year smoking history. She has never used smokeless tobacco.  Past Surgical History:  Procedure Laterality Date   ABDOMINAL HYSTERECTOMY     supracervical    BILATERAL OOPHORECTOMY  2001   BENIGN TUMOR   BREAST BIOPSY Left 1985   cyst    CATARACT EXTRACTION Right    COLONOSCOPY  08/17/2009   COLONOSCOPY WITH PROPOFOL N/A 03/19/2016   Procedure: COLONOSCOPY WITH PROPOFOL;  Surgeon: Robert Bellow, MD;  Location: [facility] END[facility];  Service: Endoscopy;  Laterality: N/A;   COLONOSCOPY WITH PROPOFOL N/A 05/15/2021   Procedure: COLONOSCOPY WITH PROPOFOL;  Surgeon: Robert Bellow, MD;  Location: [facility] ENDOSCOPY;  Service: Endoscopy;  Laterality: N/A;   CORNEAL TRANSPLANT Right    EYE SURGERY     JOINT REPLACEMENT     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   Procedure: TOTAL HIP ARTHROPLASTY ANTERIOR APPROACH;  Surgeon: Hessie Knows, MD;  Location: [facility];  Service: Orthopedics;  Laterality: Right;    Allergies  Allergen Reactions   Latex Rash   Levofloxacin     FLUOROQUINOLONES - unknown reaction    Immunization History  Administered Date(s) Administered   Fluad Quad(high Dose 65+) 03/20/2021   Influenza, High Dose Seasonal PF 03/27/2015   Influenza-Unspecified 04/26/2019, 03/30/2020    Moderna Sars-Covid-2 Vaccination 07/21/2019, 08/18/2019   PNEUMOCOCCAL CONJUGATE-20 02/05/2022   Pneumococcal Conjugate-13 09/24/2017   Pneumococcal Polysaccharide-23 04/17/2020   Tdap 10/07/2012   Zoster Recombinat (Shingrix) 02/24/2020, 05/24/2020    Family History  Problem Relation Age of Onset   Cancer Mother        OVARIAN   Stroke Father    Cancer Sister        breast   Breast cancer Sister 80   Atrial fibrillation Brother    Thyroid disease Other        MULTIPLE FAMILY MEMBERS     Current Facility-Administered Medications:    acetaminophen (TYLENOL) tablet 650 mg, 650 mg, Oral, Q6H PRN, 650 mg at 03/22/22 0551 **OR** acetaminophen (TYLENOL) suppository 650 mg, 650 mg, Rectal, Q6H PRN, Karmen Bongo, MD   albuterol (PROVENTIL) (2.5 MG/3ML) 0.083% nebulizer solution 2.5 mg, 2.5 mg, Nebulization, Q3H PRN, Anders Simmonds, MD, 2.5 mg at 03/24/22 2046   amisulpride (BARHEMSYS) injection 10 mg, 10 mg, Intravenous, Once PRN, [name], MD   bisacodyl (DULCOLAX) EC tablet 5 mg, 5 mg, Oral, Daily PRN, Karmen Bongo, MD, 5 mg at 03/24/22 1958   cycloSPORINE (RESTASIS) 0.05 % ophthalmic emulsion 1 drop, 1 drop, Both Eyes, BID, Karmen Bongo, MD, 1 drop at 03/25/22 0953   dexamethasone (DECADRON) injection 2 mg, 2 mg, Intravenous, BID, Karmen Bongo, MD, 2 mg at 03/25/22 0932   enoxaparin (LOVENOX) injection 40 mg, 40 mg, Subcutaneous, Q24H, Arlyn Dunning M, RPH, 40 mg at 03/24/22 1655   feeding supplement (ENSURE ENLIVE / ENSURE PLUS) liquid 237 mL, 237 mL, Oral, Q24H, Eugenie Filler, MD, 237 mL at 03/23/22 1436   fluticasone (FLONASE) 50 MCG/ACT nasal spray 1-2 spray, 1-2 spray, Each Nare, Daily PRN, Eugenie Filler, MD   hydrALAZINE (APRESOLINE) injection 5 mg, 5 mg, Intravenous, Q4H PRN, Karmen Bongo, MD   HYDROmorphone (DILAUDID) injection 0.25-0.5 mg, 0.25-0.5 mg, Intravenous, Q5 min PRN, [name], MD   HYDROmorphone (DILAUDID) injection  0.5-1 mg, 0.5-1 mg, Intravenous, Q4H PRN, Eugenie Filler, MD, 1 mg at 03/24/22 1105   lactated ringers infusion, , Intravenous, Continuous, Eugenie Filler, MD   levothyroxine (SYNTHROID) tablet 75 mcg, 75 mcg, Oral, QAC breakfast, Karmen Bongo, MD, 75 mcg at 03/25/22 0528   loratadine (CLARITIN) tablet 10 mg, 10 mg, Oral, Daily, Eugenie Filler, MD, 10 mg at 03/25/22 1002   multivitamin with minerals tablet 1 tablet, 1 tablet, Oral, Daily, Eugenie Filler, MD, 1 tablet at 03/25/22 1002   ondansetron (ZOFRAN) tablet 4 mg, 4 mg, Oral, Q6H PRN, 4 mg at 03/24/22 1105 **OR** ondansetron (ZOFRAN) injection 4 mg, 4 mg, Intravenous, Q6H PRN, Karmen Bongo, MD, 4 mg at 03/25/22 1016   oxyCODONE (Oxy IR/ROXICODONE) immediate release tablet 5 mg, 5 mg, Oral, Once PRN **OR** oxyCODONE (ROXICODONE) 5 MG/5ML solution 5 mg, 5 mg, Oral, Once PRN, [name], MD   pantoprazole (PROTONIX) EC tablet 40 mg, 40 mg, Oral, Daily, Karmen Bongo, MD, 40 mg at 03/25/22 1002   polyethylene glycol (MIRALAX / GLYCOLAX) packet 17 g, 17 g, Oral, Daily, Alvy Bimler, Ni, MD, 17  g at 03/24/22 0946   potassium chloride SA (KLOR-CON M) CR tablet 40 mEq, 40 mEq, Oral, Q4H, Eugenie Filler, MD, 40 mEq at 03/25/22 1003   prednisoLONE acetate (PRED FORTE) 1 % ophthalmic suspension 1 drop, 1 drop, Right Eye, Daily, Karmen Bongo, MD, 1 drop at 03/25/22 0936   prochlorperazine (COMPAZINE) injection 10 mg, 10 mg, Intravenous, Q6H PRN, Eugenie Filler, MD, 10 mg at 03/25/22 4098   promethazine (PHENERGAN) injection 6.25-12.5 mg, 6.25-12.5 mg, Intravenous, Q15 min PRN, [name], MD   rosuvastatin (CRESTOR) tablet 10 mg, 10 mg, Oral, Daily, Karmen Bongo, MD, 10 mg at 03/25/22 1003   senna-docusate (Senokot-S) tablet 2 tablet, 2 tablet, Oral, BID, Alvy Bimler, Ni, MD, 2 tablet at 03/25/22 1003   traMADol (ULTRAM) tablet 100 mg, 100 mg, Oral, Q6H PRN, Eugenie Filler, MD, 100 mg at 03/25/22 0528    traZODone (DESYREL) tablet 50 mg, 50 mg, Oral, QHS PRN, Karmen Bongo, MD, 50 mg at 03/23/22 2135     Significant Hospital Events:  03/19/2022 - admit 03/25/2022 EBUS   Interim History / Subjective:   No acute events overnight.  Underwent EBUS with biopsy today.  Lesion noted on slide.  [employer] paperwork filled out.  Objective   Blood pressure (!) 155/81, pulse 82, temperature 97.9 F (36.6 C), temperature source Oral, resp. rate 18, height 5\' 1"  (1.549 m), weight 77 kg, SpO2 96 %.        Intake/Output Summary (Last 24 hours) at 03/25/2022 1440 Last data filed at 03/25/2022 1329 Gross per 24 hour  Intake 2153.1 ml  Output --  Net 2153.1 ml    Filed Weights   03/20/22 1352  Weight: 77 kg    Examination: General: Looks well.  Stable no distress. HENT: No JVP, supple Lungs: Clear to auscultation bilaterally, normal work of breathing on room air Cardiovascular: Normal heart sounds Abdomen: Soft nontender no organomegaly Extremities: No sinus no clubbing no edema Neuro: Alert and oriented x3.  No focal deficits. Psychology: Appears mildly anxious and tearful GU: Deferred  Resolved Hospital Problem list   X  Assessment & Plan:   Bulky mediastinal lymphadenopathy with lung nodules and evidence of distant metastasis to the brain suspicious for primary lung cancer:  -- Status post bronchoscopy 03/25/2022 -- [employer] paperwork filled out  PCCM will sign off and follow results peripherally.  Best practice (daily eval):  Per [facility]   SIGNATURE    Lanier Clam, MD See Amion for contact info  03/25/2022 2:40 PM

## 2022-03-25 NOTE — Consult Note (Signed)
Chief Complaint: Cancer - unknown primary   Referring Physician(s): Alvy Bimler  Supervising Physician: Aletta Edouard  Patient Status: [facility] - In-pt  History of Present Illness: Lorraine Mills is a 71 y.o. female  who presented to the emergency department on 03/20/2022 with complaints of persistent headaches, dizziness, nausea and vomiting.    She was evaluated in the ED at [facility] on Thursday, 03/13/2022 due to severe headache. MRI showed multiple lesions on her skull.    The largest lesion is in the occipital bone, with adjacent intracranial and extraosseous extension of tumor. The smaller lesions are seen in the high parietal bone bilaterally and felt most likely to represent metastatic disease.  Since that time, her headaches have gotten worse.    On 03/19/2022, she had a CT head with and without contrast showing a posterior midline extra-axial metastasis extending through the adjacent occipital calvarium, with a small extra-axial soft tissue component and associated edema.    CT C/A/P was performed 03/20/2022 showing multiple enlarged right-sided mediastinal, hilar and subcarinal lymph nodes, multiple small lung nodules bilaterally with the dominant nodule in the periphery of the right upper lobe lung. A right adrenal mass, suspicious for metastatic disease and multifocal lucent bone metastases involving the thoracic spine, right fourth rib with pathologic fracture, and bilateral iliac bones.    She reports and approximately 10 pound weight loss, mostly over the last week or two, due to N/V.    Past Medical History:  Diagnosis Date   Anxiety    Arthritis    Depression 01/31/2016   Goiter    Hyperlipidemia    TGs   Hypothyroidism    Seasonal allergies     Past Surgical History:  Procedure Laterality Date   ABDOMINAL HYSTERECTOMY     supracervical    BILATERAL OOPHORECTOMY  2001   BENIGN TUMOR   BREAST BIOPSY Left 1985   cyst    CATARACT EXTRACTION Right    COLONOSCOPY   08/17/2009   COLONOSCOPY WITH PROPOFOL N/A 03/19/2016   Procedure: COLONOSCOPY WITH PROPOFOL;  Surgeon: Robert Bellow, MD;  Location: [facility] ENDOSCOPY;  Service: Endoscopy;  Laterality: N/A;   COLONOSCOPY WITH PROPOFOL N/A 05/15/2021   Procedure: COLONOSCOPY WITH PROPOFOL;  Surgeon: Robert Bellow, MD;  Location: [facility] ENDOSCOPY;  Service: Endoscopy;  Laterality: N/A;   CORNEAL TRANSPLANT Right    EYE SURGERY     JOINT REPLACEMENT     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   Procedure: TOTAL HIP ARTHROPLASTY ANTERIOR APPROACH;  Surgeon: Hessie Knows, MD;  Location: [facility] ORS;  Service: Orthopedics;  Laterality: Right;    Allergies: Latex and Levofloxacin  Medications: Prior to Admission medications   Medication Sig Start Date End Date Taking? Authorizing Provider  acetaminophen (TYLENOL) 500 MG tablet Take 1,000 mg by mouth every 6 (six) hours as needed for moderate pain.   Yes [provider]  cetirizine (ZYRTEC) 10 MG tablet Take 10 mg by mouth daily as needed for allergies.   Yes [provider]  fenofibrate micronized (LOFIBRA) 134 MG capsule Take 1 capsule (134 mg total) by mouth daily before breakfast. 02/06/22 02/01/23 Yes [name], MD  fluticasone (FLONASE) 50 MCG/ACT nasal spray Place 1-2 sprays into both nostrils daily as needed for allergies or rhinitis.   Yes [provider]  levothyroxine (SYNTHROID) 75 MCG tablet TAKE 1 TABLET EVERY DAY ON EMPTY STOMACHWITH A GLASS OF WATER AT LEAST 30-60 [facility] BREAKFAST Patient taking differently: Take 75 mcg by  mouth daily before breakfast. On an empty stomach with a glass of water at least 30-60 minutes before breakfast. 12/22/21  Yes Dutch Quint B, FNP  omeprazole (PRILOSEC) 20 MG capsule TAKE 1 CAPSULE BY MOUTH DAILY Patient taking differently: Take 20 mg by mouth daily. 01/28/21  Yes [name], MD  ondansetron (ZOFRAN-ODT) 4 MG disintegrating tablet Take 1 tablet (4 mg total) by mouth every  8 (eight) hours as needed for nausea or vomiting. 03/18/22  Yes McLean-Scocuzza, Nino Glow, MD  prednisoLONE acetate (PRED FORTE) 1 % ophthalmic suspension Place 1 drop into the right eye daily. 07/03/20  Yes [provider]  promethazine (PHENERGAN) 12.5 MG tablet Take 1 tablet (12.5 mg total) by mouth 2 (two) times daily as needed for nausea or vomiting. 03/19/22  Yes McLean-Scocuzza, Nino Glow, MD  RESTASIS 0.05 % ophthalmic emulsion Place 1 drop into both eyes 2 (two) times daily. 09/30/21  Yes [provider]  rosuvastatin (CRESTOR) 10 MG tablet TAKE ONE TABLET EVERY DAY. Patient taking differently: Take 10 mg by mouth daily. 03/06/22  Yes [name], MD  traZODone (DESYREL) 50 MG tablet TAKE ONE TABLET AT BEDTIME AS NEEDED FORSLEEP. Patient taking differently: Take 50 mg by mouth at bedtime as needed for sleep. 01/03/22  Yes [name], MD     Family History  Problem Relation Age of Onset   Cancer Mother        OVARIAN   Stroke Father    Cancer Sister        breast   Breast cancer Sister 68   Atrial fibrillation Brother    Thyroid disease Other        MULTIPLE FAMILY MEMBERS    Social History   Socioeconomic History   Marital status: Married    Spouse name: Not on file   Number of children: Not on file   Years of education: Not on file   Highest education level: Not on file  Occupational History   Occupation: retired  Tobacco Use   Smoking status: Former    Packs/day: 1.00    Years: 24.00    Total pack years: 24.00    Types: Cigarettes    Quit date: 05/09/1998    Years since quitting: 23.8   Smokeless tobacco: Never  Vaping Use   Vaping Use: Never used  Substance and Sexual Activity   Alcohol use: Not Currently    Comment: rarely    Drug use: No   Sexual activity: Not on file  Other Topics Concern   Not on file  Social History Narrative   Not on file   Social Determinants of Health   Financial Resource Strain: [employer]  (03/29/2021)    Overall Financial Resource Strain (CARDIA)    Difficulty of Paying Living Expenses: Not hard at all  Food Insecurity: No Food Insecurity (03/20/2022)   Hunger Vital Sign    Worried About Running Out of Food in the Last Year: Never true    [place] in the Last Year: Never true  Transportation Needs: No Transportation Needs (03/20/2022)   PRAPARE - Hydrologist (Medical): No    Lack of Transportation (Non-Medical): No  Physical Activity: Insufficiently Active (03/29/2021)   Exercise Vital Sign    Days of Exercise per Week: 3 days    Minutes of Exercise per Session: 30 min  Stress: No Stress Concern Present (03/29/2021)   [place]  Stress Questionnaire    Feeling of Stress : Not at all  Social Connections: Unknown (03/29/2021)   Social Connection and Isolation Panel [NHANES]    Frequency of Communication with Friends and Family: Not on file    Frequency of Social Gatherings with Friends and Family: Not on file    Attends Religious Services: Not on file    Active Member of Clubs or Organizations: Not on file    Attends Archivist Meetings: Not on file    Marital Status: Married     Review of Systems: A 12 point ROS discussed and pertinent positives are indicated.  All other systems are negative.  Review of Systems  Constitutional:  Positive for activity change, appetite change, fatigue and unexpected weight change.  Gastrointestinal:  Positive for nausea and vomiting.  Neurological:  Positive for headaches.    Vital Signs: BP 139/73 (BP Location: Left Arm)   Pulse 83   Temp 97.6 F (36.4 C) (Oral)   Resp 14   Ht 5\' 1"  (1.549 m)   Wt 169 lb 12.1 oz (77 kg)   SpO2 99%   BMI 32.07 kg/m   Physical Exam Vitals reviewed.  Constitutional:      Appearance: Normal appearance.  HENT:     Head: Normocephalic and atraumatic.  Eyes:     Extraocular Movements: Extraocular movements intact.   Cardiovascular:     Rate and Rhythm: Normal rate and regular rhythm.  Pulmonary:     Effort: Pulmonary effort is normal. No respiratory distress.     Breath sounds: Normal breath sounds.  Musculoskeletal:        General: Normal range of motion.     Cervical back: Normal range of motion.  Skin:    General: Skin is warm and dry.  Neurological:     General: No focal deficit present.     Mental Status: She is alert and oriented to person, place, and time.  Psychiatric:        Mood and Affect: Mood normal.        Behavior: Behavior normal.        Thought Content: Thought content normal.        Judgment: Judgment normal.    Imaging: MR BRAIN W WO CONTRAST  Result Date: 03/22/2022 CLINICAL DATA:  Follow-up occipital skull lesion. EXAM: MRI HEAD WITHOUT AND WITH CONTRAST TECHNIQUE: Multiplanar, multiecho pulse sequences of the brain and surrounding structures were obtained without and with intravenous contrast. CONTRAST:  7.14mL GADAVIST GADOBUTROL 1 MMOL/ML IV SOLN COMPARISON:  Brain MRI from 4 days ago. FINDINGS: Brain: No evidence of brain metastasis or acute infarct. Mild occipital vasogenic edema on the left adjacent to extra-axial lesion which deforms the brain at this level. No hydrocephalus or collection. Vascular: See below concerning venous structures. Skull and upper cervical spine: Heterogeneously enhancing calvarial mass extending through both inner and outer cortex, up to 3.6 cm on sagittal postcontrast images. There is affect on the dural sinuses at the lower superior sagittal sinus and torcula. A small rightward channel of superior sagittal sinus is likely patent into the transverse dural sinus on the right. Left-sided transverse sinus filling is less apparent. The torcula is up lifted and narrowed. Two other calvarial metastasis are seen in the parietal bones, on the right with probable extension into the outer cortex and scalp, 18 mm on 17:126. On diffusion imaging there small  indeterminate areas of signal abnormality in the right frontal and lower parietal regions as marked on  series 5 Sinuses/Orbits: Bilateral mastoid opacification with negative nasopharynx. Right cataract resection. Other: IMPRESSION: 1. Multifocal calvarial metastatic disease greatest in the midline occipital bone where tumor extends through the inner table and invades the lower superior sagittal sinus and torcula. A small channel of patent superior sagittal sinus is seen extending into the dominant right transverse sinus. 2. Left occipital pole vasogenic edema where contacting #1. 3. An 18 mm high right parietal bone metastasis extends through outer cortex. 4. Negative for parenchymal metastasis. Electronically Signed   By: Jorje Guild M.D.   On: 03/22/2022 11:34   CT CHEST ABDOMEN PELVIS W CONTRAST  Result Date: 03/20/2022 CLINICAL DATA:  Patient presents with multiple calvarial lesions concerning for metastatic disease. EXAM: CT CHEST, ABDOMEN, AND PELVIS WITH CONTRAST TECHNIQUE: Multidetector CT imaging of the chest, abdomen and pelvis was performed following the standard protocol during bolus administration of intravenous contrast. RADIATION DOSE REDUCTION: This exam was performed according to the departmental dose-optimization program which includes automated exposure control, adjustment of the mA and/or kV according to patient size and/or use of iterative reconstruction technique. CONTRAST:  51mL OMNIPAQUE IOHEXOL 350 MG/ML SOLN COMPARISON:  None Available. FINDINGS: CT CHEST FINDINGS Cardiovascular: Heart size appears normal. No significant pericardial effusion identified. Mediastinum/Nodes: Thyroid gland, trachea and esophagus are unremarkable. Multiple enlarged right mediastinal, multiple enlarged thoracic lymph nodes are identified which are predominantly right-sided. These include: -right internal mammary lymph node measures 1.1 cm, image 27/4. -right paratracheal lymph node measures 2.3 cm, image  24/4. -subcarinal lymph node measures 1.9 cm, image 30/4. -right hilar node measures 1.7 cm, image 27/4. Lungs/Pleura: Small right pleural effusion identified, image 32/4. Subsegmental atelectasis is identified within the basilar right middle lobe and posterior right lower lobe. Bilateral pulmonary nodules are identified, including: -dominant spiculated nodule within the periphery of the right upper lobe measures 0.9 cm , image 59/5. -Nodule within the anteromedial right upper lobe measures 0.4 cm, image 65/5. Perifissural nodule along the minor fissure measures 3 mm, image 64/5. -anteromedial left upper lobe nodule measures 4 mm, image 63/5. Musculoskeletal: Multifocal lucent bone metastases are noted. -Within the left side of the T11 vertebra there is a lucent bone lesion measuring 1.6 cm, image 106/6. -Within the left lateral T7 vertebra there is a lucent bone lesion measuring 1.1 cm, image 79/5. -Lytic lesion with pathologic fracture involving the lateral aspect of the right fourth rib is identified, image 60/5. CT ABDOMEN PELVIS FINDINGS Hepatobiliary: No focal liver abnormality is seen. No gallstones, gallbladder wall thickening, or biliary dilatation. Pancreas: Unremarkable. No pancreatic ductal dilatation or surrounding inflammatory changes. Spleen: Normal in size without focal abnormality. Adrenals/Urinary Tract: Right adrenal mass measures 3.6 x 2.0 cm, image 52/4. Normal left adrenal gland. No kidney mass or hydronephrosis identified. Urinary bladder is unremarkable. Stomach/Bowel: Stomach appears normal. The appendix is visualized and is within normal limits. No bowel wall thickening, inflammation, or distension. Vascular/Lymphatic: No significant vascular findings are present. No enlarged abdominal or pelvic lymph nodes. Reproductive: Uterus and bilateral adnexa are unremarkable. Other: No ascites or fluid collection. No peritoneal nodularity identified. Musculoskeletal: Lucent bone lesion within the  left iliac wing measures 1.3 cm, image 96/4. Lucent lesion within the right iliac bone adjacent to the SI joint measures 1.1 cm, image 91/4. Status post right hip arthroplasty. IMPRESSION: 1. Enlarged right-sided mediastinal, hilar and subcarinal lymph nodes are identified compatible with metastatic adenopathy. 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 2. Multiple small lung nodules are identified bilaterally. The dominant  nodule is in the periphery of the right upper lobe. 3. Right adrenal mass, suspicious for metastatic disease. 4. 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 Pathologic fracture involving the lateral aspect of the right fourth rib. 5. Small right pleural effusion with subsegmental atelectasis within the basilar right middle lobe and posterior right lower lobe. Electronically Signed   By: Kerby Moors M.D.   On: 03/20/2022 10:28   CT Head W or Wo Contrast  Result Date: 03/19/2022 CLINICAL DATA:  Intracranial metastatic disease EXAM: CT HEAD WITHOUT AND WITH CONTRAST TECHNIQUE: Contiguous axial images were obtained from the base of the skull through the vertex without and with intravenous contrast. RADIATION DOSE REDUCTION: This exam was performed according to the departmental dose-optimization program which includes automated exposure control, adjustment of the mA and/or kV according to patient size and/or use of iterative reconstruction technique. CONTRAST:  75mL OMNIPAQUE IOHEXOL 350 MG/ML SOLN COMPARISON:  Brain MRI 03/18/2022 FINDINGS: Brain: There is a posterior paramedian extra-axial mass near the torcula herophili with edema of the adjacent left occipital lobe. The intracranial portion of the mass measures 1.6 x 0.9 cm. There are no enhancing intraparenchymal lesions. Vascular: Filling defect at the torcula herophili extending partially into the superior sagittal sinus. Skull: Moth-eaten  appearance of the occipital bone at the site of the above-described mass. The mass extends to the extracranial space with a dome-shaped lesion measuring 1.2 x 0.6 cm. Sinuses/Orbits: No fluid levels or advanced mucosal thickening of the visualized paranasal sinuses. No mastoid or middle ear effusion. The orbits are normal. IMPRESSION: 1. 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 2. Edema of the left occipital lobe, adjacent to the above-described mass. 3. Filling defect at the torcula herophili extending partially into the superior sagittal sinus, concerning for tumor invasion. Electronically Signed   By: Ulyses Jarred M.D.   On: 03/19/2022 23:30   MR Brain Wo Contrast  Result Date: 03/18/2022 CLINICAL DATA:  Acute intractable headache.  Nausea and dizziness. EXAM: MRI HEAD WITHOUT CONTRAST TECHNIQUE: Multiplanar, multiecho pulse sequences of the brain and surrounding structures were obtained without intravenous contrast. COMPARISON:  MRI head 04/06/2015 FINDINGS: Brain: Ventricle size and cerebral volume normal. Negative for acute infarct. Mild edema in the left occipital lobe. There is associated occipital bone lesion with extraosseous mass extending intracranially which appears associated with the left occipital lobe edema. No other edema. Vascular: Negative for hyperdense vessel Skull and upper cervical spine: Multiple skull lesions. Largest lesion in the occipital bone posteriorly measures 39 x 26 mm. This contains susceptibility likely due to bony fragmentation in the mass. Smaller lesions are seen in the high parietal bone bilaterally. Central skull base intact. Sinuses/Orbits: Paranasal sinuses clear. Bilateral mastoid effusion. Right cataract extraction Other: None IMPRESSION: 1. Negative for acute infarct. 2. Multiple skull lesions. The largest lesion is in the occipital bone, with adjacent intracranial  extension of tumor. The smaller lesions are seen in the high parietal bone bilaterally. 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 3. 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 4. CT head   with contrast recommended for further evaluation. Electronically Signed   By: Franchot Gallo M.D.   On: 03/18/2022 17:19   DG Chest 2 View  Result Date: 03/14/2022 CLINICAL DATA:  Chest pain and shortness of breath EXAM: CHEST - 2 VIEW COMPARISON:  None Available. FINDINGS: The heart size and mediastinal contours are within normal limits. Both lungs are clear. The visualized skeletal structures are unremarkable. IMPRESSION: No acute cardiopulmonary abnormality. Electronically Signed   By: Beryle Flock M.D.   On: 03/14/2022 11:58    Labs:  CBC: Recent Labs    03/21/22 0535 03/22/22 0511 03/24/22 0430 03/25/22 0602  WBC 9.9 11.8* 12.1* 12.4*  HGB 14.3 14.1 13.3 14.3  HCT 42.5 42.2 39.6 42.3  PLT 262 265 236 244    COAGS: Recent Labs    03/21/22 1008  INR 1.0    BMP: Recent Labs    03/21/22 0535 03/22/22 0511 03/24/22 0430 03/25/22 0602  NA 139 140 138 138  K 3.8 3.6 3.4* 3.2*  CL 103 105 104 104  CO2 27 28 28 25   GLUCOSE 117* 108* 113* 102*  BUN 15 16 12 11   CALCIUM 9.3 9.0 9.1 9.3  CREATININE 0.74 0.71 0.66 0.57  GFRNONAA >60 >60 >60 >60    LIVER FUNCTION TESTS: Recent Labs    02/05/22 1236 03/19/22 1950  BILITOT 0.4 0.5  AST 17 83*  ALT 16 87*  ALKPHOS 60 56  PROT 7.2 7.2  ALBUMIN 4.4 3.9    TUMOR MARKERS: No results for input(s): "AFPTM", "CEA", "CA199", "CHROMGRNA" in the last 8760 hours.  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 Need for access for chemotherapy once diagnosis is confirmed.  Will proceed with image guided placement of a tunneled catheter with port tomorrow as IR schedule allows.  Risks and benefits of image guided port-a-catheter placement was discussed with  the patient including, but not limited to bleeding, infection, pneumothorax, or fibrin sheath development and need for additional procedures.  All of the patient's questions were answered, patient is agreeable to proceed. Consent signed and in chart.  Thank you for allowing our service to participate in RILEY PAPIN 's care.  Electronically Signed: Murrell Redden, PA-C   03/25/2022, 10:59 AM      I spent a total of 40 Minutes in face to face in clinical consultation, greater than 50% of which was counseling/coordinating care for placement of port a cath.

## 2022-03-25 NOTE — Op Note (Signed)
[facility] Cardiopulmonary Patient Name: Lorraine Mills Procedure Date: 03/25/2022 MRN: [id number] Attending MD: Lanier Clam MD,  Date of Birth: 02/28/1951 CSN: [id number] Age: 71 Admit Type: Inpatient Ethnicity: Not Hispanic or Latino Procedure:             Bronchoscopy Indications:           Mediastinal mass Providers:             Bonna Gains. Blessin Kanno MD, Dulcy Fanny, Faustina                         Mbumina, Technician Referring MD:           Medicines:             General Anesthesia, See the Anesthesia note for                         documentation of the administered medications Complications:         No immediate complications Estimated Blood Loss:  Estimated blood loss was minimal. Procedure:      Pre-Anesthesia Assessment:      - Universal Protocol:      - Pre-procedure Verification: Prior to the procedure, the patient's       identity was verified by full name, date of birth and medical record       number. The patient's identity was verified on all pertinent medical       records, including History and Physical. Also prior to the procedure, a       History and Physical was performed, and patient medications, allergies       and sensitivities were reviewed. The patient's tolerance of previous       anesthesia was reviewed. The risks and benefits of the procedure and the       sedation options and risks were discussed with the patient. All       questions were answered and informed consent was obtained.      - Time-Out: Prior to the start of the procedure, the patient's       identification, proposed procedure, accurate signed consent, correctly       labeled images and records, and need for prophylactic antibiotics were       verified by the physician and the nurse in the endoscopy suite.      After obtaining informed consent, the bronchoscope was passed under       direct vision. Throughout the procedure, the patient's blood pressure,        pulse, and oxygen saturations were monitored continuously. the [id number]       ([id number]) Olympus bronoscope was introduced through the mouth, via the       endotracheal tube (the patient was intubated for the procedure) and       advanced to the tracheobronchial tree. the Bronchoscope was introduced       through the and advanced to the. The procedure was accomplished without       difficulty. The patient tolerated the procedure well. The total duration       of the procedure was 25 minutes. Findings:      The endotracheal tube is in good position. The visualized portion of the       trachea is of normal caliber. The carina is blunted as if splayed by       mass effect. The tracheobronchial tree was examined  to at least the       first subsegmental level. Bronchial mucosa and anatomy are normal; there       are no endobronchial lesions, and no secretions.      An endobronchial ultrasound endoscope was utilized in order to assist       with guiding the biopsy needle in the right paratracheal area.      Transbronchial needle aspirations of a mass were performed using a fine       21 gauge needle. Transbronchial needle aspiration technique was selected       because the sampling site was not visible endoscopically. 8 were       obtained, 6 to block. Impression:      - Mediastinal mass      - The airway examination was normal.      - Endobronchial ultrasound was performed.      - A transbronchial needle aspiration was performed. Moderate Sedation:      N/A: per anesthesia care Recommendation:      - Return patient to hospital ward for ongoing care.      - Await cytology results. Procedure Code(s):      --- Professional ---      [phone], Bronchoscopy, rigid or flexible, including fluoroscopic guidance,       when performed; with revision of tracheal or bronchial stent inserted at       previous session (includes tracheal/bronchial dilation as required)      31629, Bronchoscopy, rigid or  flexible, including fluoroscopic guidance,       when performed; with transbronchial needle aspiration biopsy(s),       trachea, main stem and/or lobar bronchus(i)      82707, Bronchoscopy, rigid or flexible, including fluoroscopic guidance,       when performed; with transendoscopic endobronchial ultrasound (EBUS)       during bronchoscopic diagnostic or therapeutic intervention(s) for       peripheral lesion(s) (List separately in addition to code for primary       procedure[s]) Diagnosis Code(s):      --- Professional ---      R93.89, Abnormal findings on diagnostic imaging of other specified body       structures CPT copyright 2019 American Medical Association. All rights reserved. The codes documented in this report are preliminary and upon coder review may  be revised to meet current compliance requirements. [facility] Lanier Clam MD,  03/25/2022 2:59:23 PM Number of Addenda: 0 Scope In: Scope Out:

## 2022-03-25 NOTE — Transfer of Care (Signed)
Immediate Anesthesia Transfer of Care Note  Patient: Lorraine Mills  Procedure(s) Performed: ENDOBRONCHIAL ULTRASOUND VIDEO BRONCHOSCOPY WITHOUT FLUORO BRONCHIAL NEEDLE ASPIRATION BIOPSIES  Patient Location: Endoscopy Unit  Anesthesia Type:General  Level of Consciousness: drowsy and patient cooperative  Airway & Oxygen Therapy: Patient Spontanous Breathing and Patient connected to face mask oxygen  Post-op Assessment: Report given to RN and Post -op Vital signs reviewed and stable  Post vital signs: Reviewed and stable  Last Vitals:  Vitals Value Taken Time  BP 162/68 03/25/22 1344  Temp 36.3 C 03/25/22 1343  Pulse 81 03/25/22 1346  Resp 20 03/25/22 1346  SpO2 96 % 03/25/22 1346  Vitals shown include unvalidated device data.  Last Pain:  Vitals:   03/25/22 1343  TempSrc: Temporal  PainSc: 0-No pain      Patients Stated Pain Goal: 3 (57/01/77 [id number])  Complications:  Encounter Notable Events  Notable Event Outcome Phase Comment  Difficult to intubate - unexpected  Intraprocedure Filed from anesthesia note documentation.

## 2022-03-25 NOTE — Progress Notes (Signed)
PROGRESS NOTE    Lorraine Mills  GYK:[id number] DOB: Jan 18, 1951 DOA: 03/19/2022 PCP: [name], MD    Chief Complaint  Patient presents with   Nausea    Brief Narrative: HPI per [name] is a 71 y.o. female with medical history significant of HLD, hypothyroidism, and depression/anxiety presenting with headache and n/v in the setting of a recently diagnosed brain mass.  "I have lung cancer."  She noticed about a month ago a headache.  She noticed a knot in her skull and then noticed some pain in her chest and SOB maybe 1-2 weeks later and was getting worse.  She went to the walk-in clinic and had a chest pain and she had "air bags" in the bottoms of her lungs.  Last Thursday, the pain was unbearable and she went to the ER at [facility].  She had an MRI and she had lesions on her skull.  She went to her doctor on Tuesday and she ordered a brain MRI and the headaches have gotten worse.  Yesterday, she was having n/v and they finally decided to come to the ER.  She smoked 1ppd for about 24 years.  Some unintentional weight loss.  +night sweats for years, unchanged.  No cough.  SOB comes and goes, sometimes exertional.  No orthopnea.  No PND.  She was told that she had costochondritis causing her R rib pain.  Mother died of ovarian cancer 08/28/2008), sister had breast cancer Aug 29, 2003).         ER Course:  Headaches.  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  Referred for outpatient neurosurg/onc.  Felt awful so came to ER.  CT C/A/P likely lung cancer with diffuse mets.  Neurosurgery does not need involvement.  Onc Alvy Bimler) recommends admission to [facility].  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 -Patient noted to undergone MRI of the brain and noted to have lesions on her skull.  Due to worsening headaches patient presented to the ED. -Patient with mediastinal adenopathy with small multiple lung nodules associated with brain mets, skeletal lesions, extensive former tobacco history with findings concerning for stage IV lung cancer. -Patient seen by oncology who recommended evaluation by pulmonary for possible bronchoscopy and biopsies in addition to radiation oncology consultation. -Patient seen by pulmonary who are recommending endobronchial ultrasound with video bronchoscopy with transbronchial biopsies to be done today, 03/25/2022 with Dr. Silas Flood. -Patient also seen by radiation oncology and patient s/p SRS protocol MRI brain scan, 03/22/2022. -Continue current regimen of Decadron. -Continue IV fluids. -Oncology consulting IR for port placement. -Oncology following and appreciate input and recommendations.  2.  Dizziness and headaches -Like secondary to metastatic disease due to intracranial extension of skull lesion. -Concern for also dehydration. -Patient with some clinical improvement after being started on IV fluids and IV steroids. -Continue IV Compazine as needed, Ultram as needed headache as well as IV Dilaudid as needed. -Continue gentle hydration. -Supportive care. -Rad/Onc following, oncology following.  3.  Hyperlipidemia -Statin. -Continue to hold [place]  and may resume in the outpatient setting.  4.  Hypothyroidism -Continue home regimen Synthroid.   5.  Depression/anxiety -Continue current regimen trazodone as needed. -May benefit from SSRI treatment however will defer to the outpatient setting with PCP.  6.  Obesity -BMI 32.07kg/m2. -Lifestyle modification. -Outpatient follow-up with  PCP.   DVT prophylaxis: Lovenox Code Status: Full Family Communication: Updated patient and husband and son and daughter-in-law at bedside.  Disposition: Likely home once cleared by oncology, pulmonary, radiation oncology.  Likely home on 03/28/2022.  Status is: Inpatient Remains inpatient appropriate because: Severity of illness/burden of disease   Consultants:  Pulmonary: Dr. Chase Caller 03/21/2022 Oncology: Dr.Gorsuch 03/20/2022 Radiation oncology: Freeman Caldron, PA  Procedures:  CT chest abdomen and pelvis 03/20/2022 CT head 03/19/2022 MRI head 03/22/2022 Bronchcoscopy with biopsy pending  Antimicrobials:  None   Subjective: Sitting up in bed.  Complains of nausea.  Denies any emesis.  Some improvement with headache.  Some improvement with dizziness.  No chest pain.  No significant shortness of breath.  Husband and son at bedside.  Objective: Vitals:   03/24/22 0533 03/24/22 1325 03/24/22 1956 03/25/22 0419  BP: (!) 154/77 (!) 168/86 (!) 173/97 139/73  Pulse: 76 71 79 83  Resp: 14 18 18 14   Temp: 98.5 F (36.9 C) 97.7 F (36.5 C) (!) 97.5 F (36.4 C) 97.6 F (36.4 C)  TempSrc: Oral Oral Oral Oral  SpO2: 96% 94% 97% 99%  Weight:      Height:        Intake/Output Summary (Last 24 hours) at 03/25/2022 1106 Last data filed at 03/25/2022 0300 Gross per 24 hour  Intake 1253.1 ml  Output --  Net 1253.1 ml    Filed Weights   03/20/22 1352  Weight: 77 kg    Examination:  General exam: NAD. Respiratory system: Lungs clear to auscultation bilaterally.  No wheezes, no crackles, no rhonchi.  Fair air movement.  Speaking in full sentences.  No use of accessory muscles of respiration.   Cardiovascular system: RRR no murmurs rubs or gallops.  No JVD.  No lower extremity edema.  Gastrointestinal system: Abdomen is soft, nontender, nondistended, positive bowel sounds.  No rebound.  No guarding.  Central nervous system: Alert and oriented. No focal neurological  deficits. Extremities: Symmetric 5 x 5 power. Skin: No rashes, lesions or ulcers Psychiatry: Judgement and insight appear normal. Mood & affect appropriate.     Data Reviewed: I have personally reviewed following labs and imaging studies  CBC: Recent Labs  Lab 03/19/22 1950 03/21/22 0535 03/22/22 0511 03/24/22 0430 03/25/22 0602  WBC 11.8* 9.9 11.8* 12.1* 12.4*  NEUTROABS 9.6*  --   --   --  9.3*  HGB 15.3* 14.3 14.1 13.3 14.3  HCT 45.2 42.5 42.2 39.6 42.3  MCV 93.4 94.0 95.0 95.4 93.2  PLT 306 262 265 236 244     Basic Metabolic Panel: Recent Labs  Lab 03/18/22 1436 03/19/22 1950 03/21/22 0535 03/22/22 0511 03/24/22 0430 03/25/22 0602  NA 137 138 139 140 138 138  K 3.8 3.6 3.8 3.6 3.4* 3.2*  CL 99 100 103 105 104 104  CO2 28 26 27 28 28 25   GLUCOSE 135* 129* 117* 108* 113* 102*  BUN 18 16 15 16 12 11   CREATININE 0.76 0.88 0.74 0.71 0.66 0.57  CALCIUM 9.8 10.2 9.3 9.0 9.1 9.3  MG 2.0 2.1  --  2.3 2.5* 2.4     GFR: Estimated Creatinine Clearance: 61.5 mL/min (by C-G  formula based on SCr of 0.57 mg/dL).  Liver Function Tests: Recent Labs  Lab 03/19/22 1950  AST 83*  ALT 87*  ALKPHOS 56  BILITOT 0.5  PROT 7.2  ALBUMIN 3.9     CBG: No results for input(s): "GLUCAP" in the last 168 hours.   No results found for this or any previous visit (from the past 240 hour(s)).       Radiology Studies: No results found.      Scheduled Meds:  cycloSPORINE  1 drop Both Eyes BID   dexamethasone (DECADRON) injection  2 mg Intravenous BID   enoxaparin (LOVENOX) injection  40 mg Subcutaneous Q24H   feeding supplement  237 mL Oral Q24H   levothyroxine  75 mcg Oral QAC breakfast   loratadine  10 mg Oral Daily   multivitamin with minerals  1 tablet Oral Daily   pantoprazole  40 mg Oral Daily   polyethylene glycol  17 g Oral Daily   potassium chloride  40 mEq Oral Q4H   prednisoLONE acetate  1 drop Right Eye Daily   rosuvastatin  10 mg Oral Daily    senna-docusate  2 tablet Oral BID   Continuous Infusions:  lactated ringers       LOS: 5 days    Time spent: 35 minutes    Irine Seal, MD [facility]   To contact the attending provider between 7A-7P or the covering provider during after hours 7P-7A, please log into the web site [email or web address] and access using universal [place] password for that web site. If you do not have the password, please call the hospital operator.  03/25/2022, 11:06 AM

## 2022-03-25 NOTE — Anesthesia Procedure Notes (Addendum)
Procedure Name: Intubation Date/Time: 03/25/2022 12:43 PM  Performed by: Genelle Bal, CRNAPre-anesthesia Checklist: Patient identified, Emergency Drugs available, Suction available and Patient being monitored Patient Re-evaluated:Patient Re-evaluated prior to induction Oxygen Delivery Method: Circle system utilized Preoxygenation: Pre-oxygenation with 100% oxygen Induction Type: IV induction Ventilation: Mask ventilation without difficulty and Oral airway inserted - appropriate to patient size Laryngoscope Size: Glidescope and 3 Grade View: Grade I Tube type: Oral Tube size: 8.0 mm Number of attempts: 2 Airway Equipment and Method: Stylet, Oral airway, Video-laryngoscopy and Bougie stylet Placement Confirmation: ETT inserted through vocal cords under direct vision, positive ETCO2 and breath sounds checked- equal and bilateral Secured at: 22 cm Tube secured with: Tape Dental Injury: Teeth and Oropharynx as per pre-operative assessment  Difficulty Due To: Difficulty was unanticipated, Difficult Airway- due to anterior larynx and Difficult Airway- due to reduced neck mobility Future Recommendations: Recommend- induction with short-acting agent, and alternative techniques readily available Comments: DL x1 Mil 2 by Alford Highland CRNA, grade III view, resistance met when attempting to pass ETT. DL x1 Mac 3 by Sabra Heck MDA with no change in view. Attempted bougie pass which was unsuccessful with esophageal intubation. Glidescope LoPro #3, grade I view. Improved view however due to neck immobility & very anterior larynx it was difficult to get ETT through glottic opening. 9.0 ETT was too large however 8.0 ETT passed easier.

## 2022-03-25 NOTE — Anesthesia Postprocedure Evaluation (Signed)
Anesthesia Post Note  Patient: Lorraine Mills  Procedure(s) Performed: ENDOBRONCHIAL ULTRASOUND VIDEO BRONCHOSCOPY WITHOUT FLUORO BRONCHIAL NEEDLE ASPIRATION BIOPSIES     Patient location during evaluation: PACU Anesthesia Type: General Level of consciousness: awake and alert Pain management: pain level controlled Vital Signs Assessment: post-procedure vital signs reviewed and stable Respiratory status: spontaneous breathing, nonlabored ventilation and respiratory function stable Cardiovascular status: blood pressure returned to baseline and stable Postop Assessment: no apparent nausea or vomiting Anesthetic complications: yes   Encounter Notable Events  Notable Event Outcome Phase Comment  Difficult to intubate - unexpected  Intraprocedure Filed from anesthesia note documentation.    Last Vitals:  Vitals:   03/25/22 1409 03/25/22 1427  BP: (!) 161/70 (!) 155/81  Pulse: 78 82  Resp: 17 18  Temp:  36.6 C  SpO2: 96% 96%    Last Pain:  Vitals:   03/25/22 1427  TempSrc: Oral  PainSc:                  [name]

## 2022-03-25 NOTE — Op Note (Signed)
Flexible and EBUS Bronchoscopy Procedure Note  ZOYA SPRECHER  [id number]  1951-02-14  Date:03/25/22  Time:2:36 PM   Provider Performing:Eloyce Bultman R Nozomi Mettler   Procedure: Flexible bronchoscopy and EBUS Bronchoscopy  Indication(s) Mediastinal mass, lung nodules, with distant metastases on imaging  Consent Risks of the procedure as well as the alternatives and risks of each were explained to the patient and/or caregiver.  Consent for the procedure was obtained.  Anesthesia General Anesthesia   Time Out Verified patient identification, verified procedure, site/side was marked, verified correct patient position, special equipment/implants available, medications/allergies/relevant history reviewed, required imaging and test results available.   Sterile Technique Usual hand hygiene, masks, gowns, and gloves were used   Procedure Description Diagnostic bronchoscope advanced through endotracheal tube and into airway.  Airways were examined down to subsegmental level with findings noted below.  Following diagnostic evaluation, the diagnostic bronchoscope was then removed and the EBUS bronchoscope was advanced into airway with mediastinal mass biopsied and sent for slide, cell block.  The EBUS bronchoscope was removed after assuring no active bleeding from biopsy site.  Findings: Large contiguous mass 2 samples to slide with lesion noted 6 samples to block   Complications/Tolerance None; patient tolerated the procedure well. Chest X-ray is needed post procedure.   EBL Minimal   Specimen(s) Cytology

## 2022-03-25 NOTE — Anesthesia Preprocedure Evaluation (Signed)
Anesthesia Evaluation  Patient identified by MRN, date of birth, ID band Patient awake    Reviewed: Allergy & Precautions, NPO status , Patient's Chart, lab work & pertinent test results  History of Anesthesia Complications Negative for: history of anesthetic complications  Airway Mallampati: II       Dental  (+) Chipped   Pulmonary neg shortness of breath, former smoker,    breath sounds clear to auscultation       Cardiovascular Exercise Tolerance: Good  Rhythm:Regular Rate:Normal     Neuro/Psych  Headaches, Seizures -,  Anxiety Depression    GI/Hepatic negative GI ROS, Neg liver ROS,   Endo/Other  Hypothyroidism   Renal/GU negative Renal ROS     Musculoskeletal  (+) Arthritis , Osteoarthritis,    Abdominal (+) + obese,   Peds  Hematology negative hematology ROS (+)   Anesthesia Other Findings Brain Metastasis  Reproductive/Obstetrics                             Anesthesia Physical  Anesthesia Plan  ASA: 3  Anesthesia Plan: General   Post-op Pain Management: Minimal or no pain anticipated   Induction: Intravenous  PONV Risk Score and Plan: 3 and Ondansetron, Dexamethasone, Midazolam and Treatment may vary due to age or medical condition  Airway Management Planned: Oral ETT  Additional Equipment:   Intra-op Plan:   Post-operative Plan: Extubation in OR  Informed Consent: I have reviewed the patients History and Physical, chart, labs and discussed the procedure including the risks, benefits and alternatives for the proposed anesthesia with the patient or authorized representative who has indicated his/her understanding and acceptance.     Dental Advisory Given  Plan Discussed with: CRNA  Anesthesia Plan Comments: (Patient consented for risks of anesthesia including but not limited to:  - adverse reactions to medications - risk of airway placement if required -  damage to eyes, teeth, lips or other oral mucosa - nerve damage due to positioning  - sore throat or hoarseness - Damage to heart, brain, nerves, lungs, other parts of body or loss of life  Patient voiced understanding.)        Anesthesia Quick Evaluation

## 2022-03-25 NOTE — Brief Op Note (Signed)
Procedure: Endobronchial ultrasound with TBNA  Description: Endobronchial ultrasound with biopsies to mediastinal mass.  2 to slide, 6 to block.  Lesion noted on slide.  Estimated blood loss: 3 mL

## 2022-03-26 ENCOUNTER — Ambulatory Visit: Payer: PPO | Admitting: Internal Medicine

## 2022-03-26 ENCOUNTER — Inpatient Hospital Stay (HOSPITAL_COMMUNITY): Payer: PPO

## 2022-03-26 ENCOUNTER — Ambulatory Visit
Admit: 2022-03-26 | Discharge: 2022-03-26 | Disposition: A | Discharge status: Home / Self Care | Payer: PPO | Attending: Radiation Oncology | Admitting: Radiation Oncology

## 2022-03-26 ENCOUNTER — Other Ambulatory Visit: Payer: Self-pay

## 2022-03-26 DIAGNOSIS — C7931 Secondary malignant neoplasm of brain: Secondary | ICD-10-CM

## 2022-03-26 DIAGNOSIS — C7951 Secondary malignant neoplasm of bone: Secondary | ICD-10-CM

## 2022-03-26 DIAGNOSIS — C799 Secondary malignant neoplasm of unspecified site: Secondary | ICD-10-CM | POA: Diagnosis not present

## 2022-03-26 HISTORY — PX: IR IMAGING GUIDED PORT INSERTION: IMG5740

## 2022-03-26 LAB — RAD ONC ARIA SESSION SUMMARY
Course Elapsed Days: 0
Plan Fractions Treated to Date: 1
Plan Prescribed Dose Per Fraction: 6 Gy
Plan Total Fractions Prescribed: 5
Plan Total Prescribed Dose: 30 Gy
Reference Point Dosage Given to Date: 6 Gy
Reference Point Session Dosage Given: 6 Gy
Session Number: 1

## 2022-03-26 LAB — CBC WITH DIFFERENTIAL/PLATELET
Abs Immature Granulocytes: 0.07 10*3/uL (ref 0.00–0.07)
Basophils Absolute: 0 10*3/uL (ref 0.0–0.1)
Basophils Relative: 0 %
Eosinophils Absolute: 0 10*3/uL (ref 0.0–0.5)
Eosinophils Relative: 0 %
HCT: 41 % (ref 36.0–46.0)
Hemoglobin: 14 g/dL (ref 12.0–15.0)
Immature Granulocytes: 1 %
Lymphocytes Relative: 13 %
Lymphs Abs: 1.8 10*3/uL (ref 0.7–4.0)
MCH: 31.9 pg (ref 26.0–34.0)
MCHC: 34.1 g/dL (ref 30.0–36.0)
MCV: 93.4 fL (ref 80.0–100.0)
Monocytes Absolute: 1.3 10*3/uL — ABNORMAL HIGH (ref 0.1–1.0)
Monocytes Relative: 9 %
Neutro Abs: 11.4 10*3/uL — ABNORMAL HIGH (ref 1.7–7.7)
Neutrophils Relative %: 77 %
Platelets: 245 10*3/uL (ref 150–400)
RBC: 4.39 MIL/uL (ref 3.87–5.11)
RDW: 11.8 % (ref 11.5–15.5)
WBC: 14.7 10*3/uL — ABNORMAL HIGH (ref 4.0–10.5)
nRBC: 0 % (ref 0.0–0.2)

## 2022-03-26 LAB — BASIC METABOLIC PANEL
Anion gap: 7 (ref 5–15)
BUN: 13 mg/dL (ref 8–23)
CO2: 27 mmol/L (ref 22–32)
Calcium: 9.3 mg/dL (ref 8.9–10.3)
Chloride: 104 mmol/L (ref 98–111)
Creatinine, Ser: 0.63 mg/dL (ref 0.44–1.00)
GFR, Estimated: >60 mL/min (ref >60)
Glucose, Bld: 107 mg/dL — ABNORMAL HIGH (ref 70–99)
Potassium: 4 mmol/L (ref 3.5–5.1)
Sodium: 138 mmol/L (ref 135–145)

## 2022-03-26 LAB — MAGNESIUM: Magnesium: 2.2 mg/dL (ref 1.7–2.4)

## 2022-03-26 MED ORDER — MIDAZOLAM HCL 2 MG/2ML IJ SOLN
INTRAMUSCULAR | Status: AC | PRN
  Administered 2022-03-26: 1 mg via INTRAVENOUS

## 2022-03-26 MED ORDER — POLYETHYLENE GLYCOL 3350 17 G PO PACK
17.0000 g | PACK | Freq: Two times a day (BID) | ORAL | Status: DC
  Administered 2022-03-26 – 2022-03-27 (×2): 17 g via ORAL
  Filled 2022-03-26 (×2): qty 1

## 2022-03-26 MED ORDER — FENTANYL CITRATE (PF) 100 MCG/2ML IJ SOLN
INTRAMUSCULAR | Status: AC | PRN
  Administered 2022-03-26: 50 ug via INTRAVENOUS

## 2022-03-26 MED ORDER — LIDOCAINE-EPINEPHRINE 1 %-1:100000 IJ SOLN
INTRAMUSCULAR | Status: AC
  Filled 2022-03-26: qty 1

## 2022-03-26 MED ORDER — FENTANYL CITRATE (PF) 100 MCG/2ML IJ SOLN
INTRAMUSCULAR | Status: AC
  Filled 2022-03-26: qty 2

## 2022-03-26 MED ORDER — LIDOCAINE-EPINEPHRINE 1 %-1:100000 IJ SOLN
INTRAMUSCULAR | Status: AC | PRN
  Administered 2022-03-26: 10 mL via INTRADERMAL

## 2022-03-26 MED ORDER — MIDAZOLAM HCL 2 MG/2ML IJ SOLN
INTRAMUSCULAR | Status: AC
  Filled 2022-03-26: qty 4

## 2022-03-26 MED ORDER — ONDANSETRON HCL 4 MG/2ML IJ SOLN
4.0000 mg | Freq: Once | INTRAMUSCULAR | Status: AC
  Administered 2022-03-26: 4 mg via INTRAVENOUS
  Filled 2022-03-26: qty 2

## 2022-03-26 MED ORDER — HEPARIN SOD (PORK) LOCK FLUSH 100 UNIT/ML IV SOLN
INTRAVENOUS | Status: AC
  Filled 2022-03-26: qty 5

## 2022-03-26 NOTE — Progress Notes (Signed)
  Progress Note   Patient: Lorraine Mills:[id number] DOB: March 21, 1951 DOA: 03/19/2022     6 DOS: the patient was seen and examined on 03/26/2022 at 9:39AM      Brief hospital course: Lorraine Mills is a 71 y.o. F with hypothyroidism, obesity who presented with headache, nausea and vomiting in setting of newly diagnosed lung mass metastatic to the skull.     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  No confusion, no seizures, no fever.     Physical Exam: BP (!) 162/76 (BP Location: Right Arm)   Pulse 73   Temp 98.4 F (36.9 C) (Oral)   Resp 18   Ht 5\' 1"  (1.549 m)   Wt 77 kg   SpO2 97%   BMI 32.07 kg/m   Elderly adult female, lying in bed, no acute distress, appears weak and tired, covered her head with a towel due to nausea RRR, no murmurs, no peripheral edema Respiratory rate normal, lungs clear without rales or wheezes Abdomen soft, no tenderness palpation, no guarding, no ascites Attention normal, affect blunted by nausea, face symmetric, speech fluent, moves upper extremities generalized weakness but symmetric strength  Data Reviewed: Complete blood count shows white blood cells up to 14, hemoglobin and platelets normal Basic metabolic panel normal Bronchoscopy performed yesterday with chance bronchial needle biopsy MRI brain showed calvarial metastasis    Family Communication: Husband at the  bedside    Disposition: Status is: Inpatient The patient was admitted with a new brain mass and severe nausea.  As of today she is unable to tolerate anything by mouth.  Probably this is from her brain mass, discussed with oncology and radiation oncology, they suspect that this is from her constipation, although the constipation is now resolved.  Unclear if this will improve with radiation, unlikely.  We will augment antiemetics         Author: Edwin Dada, MD 03/26/2022 4:32 PM  For on call review [email or web address].

## 2022-03-26 NOTE — Assessment & Plan Note (Signed)
Continue levothyroxine 

## 2022-03-26 NOTE — Assessment & Plan Note (Signed)
Resolved with bowel movement yesterday

## 2022-03-26 NOTE — Assessment & Plan Note (Signed)
Continue Crestor 

## 2022-03-26 NOTE — Procedures (Signed)
Interventional Radiology Procedure Note  Procedure: RT IJ POWER PORT    Complications: None  Estimated Blood Loss:  MIN  Findings: TIP SVCRA    M. TREVOR Siena Poehler, MD    

## 2022-03-26 NOTE — Assessment & Plan Note (Signed)
BMI 32

## 2022-03-26 NOTE — Assessment & Plan Note (Signed)
-   Follow up with Oncology after discharge

## 2022-03-26 NOTE — Hospital Course (Signed)
Lorraine Mills is a 71 y.o. female with medical history significant of HLD, hypothyroidism, and depression/anxiety presenting with headache and n/v in the setting of a recently diagnosed brain mass.  "I have lung cancer."  She noticed about a month ago a headache.  She noticed a knot in her skull and then noticed some pain in her chest and SOB maybe 1-2 weeks later and was getting worse.  She went to the walk-in clinic and had a chest pain and she had "air bags" in the bottoms of her lungs.  Last Thursday, the pain was unbearable and she went to the ER at [facility].  She had an MRI and she had lesions on her skull.  She went to her doctor on Tuesday and she ordered a brain MRI and the headaches have gotten worse.  Yesterday, she was having n/v and they finally decided to come to the ER.  She smoked 1ppd for about 24 years.  Some unintentional weight loss.  +night sweats for years, unchanged.  No cough.  SOB comes and goes, sometimes exertional.  No orthopnea.  No PND.  She was told that she had costochondritis causing her R rib pain.  Mother died of ovarian cancer 08-17-08), sister had breast cancer August 18, 2003).

## 2022-03-26 NOTE — Assessment & Plan Note (Signed)
-   Continue dexamethasone - Continue antiemetics - Continue radiation therapy

## 2022-03-26 NOTE — Progress Notes (Signed)
Physical Therapy Treatment Patient Details Name: Lorraine Mills MRN: [id number] DOB: 1950-06-19 Today's Date: 03/26/2022   History of Present Illness Patient is 71 y.o. female who presented to ED with history the last 1 or 2 months he has had headaches that have been progressive also weight loss of 10 pounds with half of the dose weight loss coming in the last few days with nausea and vomiting.  Also nonspecific chest pains and progressive shortness of breath in the last 1 or 2 months that is also progressive.  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 MRI of brain note multiple skull lesions, extraosseous extension of tumor in the left occipital lobe with edema in the left occipital lobe. PMH of Anxiety, Arthritis, Depression (01/31/2016), Goiter, Hyperlipidemia, Hypothyroidism, and Seasonal allergies.    PT Comments    Pt eventually agreeable to mobilize with therapy. She mobilized well-no device or physical assistance required. Slow but steady gait. Some fatigue and mild dyspnea once back in room. Family members (son and daughter n law (Annetta, OT) in room. She was preparing to assist pt with getting a shower. Based on performance today, will d/c pt from PT caseload and will ask mobility team to follow and mobilize pt as tolerated.    Recommendations for follow up therapy are one component of a multi-disciplinary discharge planning process, led by the attending physician.  Recommendations may be updated based on patient status, additional functional criteria and insurance authorization.  Follow Up Recommendations  No PT follow up     Assistance Recommended at Discharge PRN  Patient can return home with the following     Equipment Recommendations  None recommended by PT     Recommendations for Other Services       Precautions / Restrictions Precautions Precautions: Fall Restrictions Weight Bearing Restrictions: No     Mobility  Bed Mobility Overal bed mobility: Modified Independent                  Transfers Overall transfer level: Modified independent                      Ambulation/Gait Ambulation/Gait assistance: Modified independent (Device/Increase time) Gait Distance (Feet): 350 Feet Assistive device: None Gait Pattern/deviations: Step-through pattern, Decreased stride length       General Gait Details: slow but steady. pt tolerated distance well. No LOB. Mild dyspnea noted upon return to room.   Stairs             Wheelchair Mobility    Modified Rankin (Stroke Patients Only)       Balance           Standing balance support: During functional activity Standing balance-Leahy Scale: Good                              Cognition Arousal/Alertness: Awake/alert Behavior During Therapy: Flat affect Overall Cognitive Status: Within Functional Limits for tasks assessed                                          Exercises      General Comments        Pertinent  Vitals/Pain Pain Assessment Pain Assessment: Faces Faces Pain Scale: Hurts a little bit Pain Location: abdomen Pain Descriptors / Indicators: Discomfort, Sore Pain Intervention(s): Monitored during session, Repositioned    Home Living                          Prior Function            PT Goals (current goals can now be found in the care plan section) Progress towards PT goals: Goals met/education completed, patient discharged from PT    Frequency           PT Plan      Co-evaluation              AM-PAC PT "6 Clicks" Mobility   Outcome Measure  Help needed turning from your back to your side while in a flat bed without using bedrails?: None Help needed moving from lying on  your back to sitting on the side of a flat bed without using bedrails?: None Help needed moving to and from a bed to a chair (including a wheelchair)?: None Help needed standing up from a chair using your arms (e.g., wheelchair or bedside chair)?: None Help needed to walk in hospital room?: A Little Help needed climbing 3-5 steps with a railing? : A Little 6 Click Score: 22    End of Session   Activity Tolerance: Patient tolerated treatment well Patient left: in chair;with call bell/phone within reach;with family/visitor present (Brynn ( in room preparing to help pt shower))         Time: 1018-1030 PT Time Calculation (min) (ACUTE ONLY): 12 min  Charges:  $Gait Training: 8-22 mins                          P, PT Acute Rehabilitation  Office: [phone] Pager: [phone]     

## 2022-03-26 NOTE — Progress Notes (Signed)
  Radiation Oncology         [phone]) [phone] ________________________________  Stereotactic Treatment Procedure Note  Name: Lorraine Mills MRN: [id number]  Date: 03/26/2022  DOB: December 26, 1950  SPECIAL TREATMENT PROCEDURE    ICD-10-CM   1. Metastasis to brain [facility])  C79.31     2. Metastasis to bone (HCC)  C79.51       3D TREATMENT PLANNING AND DOSIMETRY:  The patient's radiation plan was reviewed and approved by neurosurgery and radiation oncology prior to treatment.  It showed 3-dimensional radiation distributions overlaid onto the planning CT/MRI image set.  [facility] for the target structures as well as the organs at risk were reviewed. The documentation of the 3D plan and dosimetry are filed in the radiation oncology EMR.  NARRATIVE:  Lorraine Mills was brought to the TrueBeam stereotactic radiation treatment machine and placed supine on the CT couch. The head frame was applied, and the patient was set up for stereotactic radiosurgery.  Neurosurgery was present for the set-up and delivery  SIMULATION VERIFICATION:  In the couch zero-angle position, the patient underwent Exactrac imaging using the Brainlab system with orthogonal KV images.  These were carefully aligned and repeated to confirm treatment position for each of the isocenters.  The Exactrac snap film verification was repeated at each couch angle.  PROCEDURE: Lorraine Mills received stereotactic radiosurgery to the following targets: Left parietal 36 mm target was treated using 5 Rapid Arc VMAT Beams to a prescription dose of 6 Gy to be repeated for a total of 5 fractions to achieve a total prescription dose of 30 Gy.  ExacTrac registration was performed for each couch angle.  The 100% isodose line was prescribed.  6 MV X-rays were delivered in the flattening filter free beam mode.  STEREOTACTIC TREATMENT MANAGEMENT:  Following delivery, the patient was transported to nursing in stable condition and monitored for possible acute effects.   Vital signs were recorded . The patient tolerated treatment without significant acute effects, and was discharged to home in stable condition.    PLAN: Follow-up in one month.  ________________________________  Sheral Apley. Tammi Klippel, M.D.

## 2022-03-27 ENCOUNTER — Other Ambulatory Visit (HOSPITAL_COMMUNITY): Payer: Self-pay

## 2022-03-27 ENCOUNTER — Telehealth: Payer: Self-pay | Admitting: Oncology

## 2022-03-27 ENCOUNTER — Telehealth: Payer: Self-pay

## 2022-03-27 DIAGNOSIS — E669 Obesity, unspecified: Secondary | ICD-10-CM | POA: Diagnosis not present

## 2022-03-27 DIAGNOSIS — K5909 Other constipation: Secondary | ICD-10-CM | POA: Diagnosis not present

## 2022-03-27 DIAGNOSIS — C799 Secondary malignant neoplasm of unspecified site: Secondary | ICD-10-CM | POA: Diagnosis not present

## 2022-03-27 DIAGNOSIS — E785 Hyperlipidemia, unspecified: Secondary | ICD-10-CM | POA: Diagnosis not present

## 2022-03-27 LAB — CYTOLOGY - NON PAP

## 2022-03-27 MED ORDER — HEPARIN SOD (PORK) LOCK FLUSH 100 UNIT/ML IV SOLN
500.0000 [IU] | Freq: Once | INTRAVENOUS | Status: AC
  Administered 2022-03-27: 500 [IU] via INTRAVENOUS
  Filled 2022-03-27: qty 5

## 2022-03-27 MED ORDER — ONDANSETRON HCL 4 MG PO TABS
4.0000 mg | ORAL_TABLET | Freq: Four times a day (QID) | ORAL | 1 refills | Status: AC | PRN
  Filled 2022-03-27: qty 30, 8d supply, fill #0

## 2022-03-27 MED ORDER — DEXAMETHASONE 2 MG PO TABS
2.0000 mg | ORAL_TABLET | Freq: Two times a day (BID) | ORAL | 0 refills | Status: DC
  Filled 2022-03-27: qty 60, 30d supply, fill #0

## 2022-03-27 MED ORDER — ANTICOAGULANT SODIUM CITRATE 4% (200MG/5ML) IV SOLN: 5.0000 mL | Freq: Once | Status: DC

## 2022-03-27 NOTE — Telephone Encounter (Signed)
Left VM with patient to make her aware of appointment scheduled to see Dr. Janese Banks after discharge.   Lorraine Mills

## 2022-03-27 NOTE — Progress Notes (Signed)
Mobility Specialist - Progress Note   03/27/22 0942  Mobility  Activity Ambulated independently in hallway  Level of Assistance Independent after set-up  Assistive Device None  Distance Ambulated (ft) 500 ft  Activity Response Tolerated well  Mobility Referral Yes  $Mobility charge 1 Mobility   Pt received in chair and agreed for mobility, no c/o pain nor discomfort during ambulation. Pt back to chair with all needs met and family in room.   Roderick Pee Mobility Specialist

## 2022-03-27 NOTE — Telephone Encounter (Signed)
Patient's daughter, Mariea Clonts, called to let us know that patient was discharged from hospital today and needs to have an appointment with Dr. Deborra Medina within one week.  Caryl Pina states patient has other appointments with cancer center where she will be receiving radiation tomorrow and then Monday, Wednesday, and Friday of next week.  Caryl Pina states patient has an oncology appointment on Thursday which is just before Dr. [facility] Follow-up slot.  Caryl Pina states patient would be able to come in on Tuesday of next week.  Dr. Derrel Nip has two Same Day appointments at the end of the day on Tuesday (04/01/2022), but they are normally a virtual visit.  Would Dr. Derrel Nip like for [place] to schedule patient on 04/01/2022 at 4:30pm for a hospital follow-up?

## 2022-03-27 NOTE — Discharge Summary (Signed)
Physician Discharge Summary   Patient: Lorraine Mills MRN: [id number] DOB: 19-Jun-1950  Admit date:     03/19/2022  Discharge date: 03/27/22  Discharge Physician: Edwin Dada   PCP: [name], MD     Recommendations at discharge:  Follow up with Dr. Janese Banks Oncology as directed Follow up with Radiation Oncology Dr. 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 F with hypothyroidism, obesity who presented with headache, nausea, and vomiting in the setting of newly diagnosed lung mass.  In the hospital, she had CT head, chest abdomen and pelvis that showed metastasis to the skull, spine, and ribs, as well as adrenal glands, with likely right upper lobe lung primary.      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   Oncology consulted and started on dexamethasone and radiation.    Discharged with close Oncology follow up.       Nausea/Vomiting Resolved with dexamethasone and anti-emetics.           The [facility] Controlled Substances Registry was reviewed for this patient prior to discharge.   Consultants: Oncology, Radiation Oncology, Pulmonology Procedures performed:  - Bronchoscopy, EBUS and transbronchial biopsy - CT head, chest abdomen and pelvis   Disposition: Home   DISCHARGE  MEDICATION: Allergies as of 03/27/2022       Reactions   Latex Rash   Levofloxacin    FLUOROQUINOLONES - unknown reaction        Medication List     STOP taking these medications    ondansetron 4 MG disintegrating tablet Commonly known as: ZOFRAN-ODT       TAKE these medications    acetaminophen 500 MG tablet Commonly known as: TYLENOL Take 1,000 mg by mouth every 6 (six) hours as needed for moderate pain.   cetirizine 10 MG tablet Commonly known as: ZYRTEC Take 10 mg by mouth daily as needed for allergies.   dexamethasone 2 MG tablet Commonly known as: DECADRON Take 1 tablet (2 mg total) by mouth 2 (two) times daily with a meal.   fenofibrate micronized 134 MG capsule Commonly known as: LOFIBRA Take 1 capsule (134 mg total) by mouth daily before breakfast.   fluticasone 50 MCG/ACT nasal spray Commonly known as: FLONASE Place 1-2 sprays into both nostrils daily as needed for allergies or rhinitis.   levothyroxine 75 MCG tablet Commonly known as: SYNTHROID TAKE 1 TABLET EVERY DAY ON EMPTY STOMACHWITH A GLASS OF WATER AT LEAST 30-60 MINBEFORE BREAKFAST What changed: See the new instructions.   omeprazole 20 MG capsule Commonly known as: PRILOSEC TAKE 1 CAPSULE BY MOUTH DAILY   ondansetron 4 MG tablet Commonly known as: ZOFRAN Take 1 tablet (4 mg total) by mouth every 6 (six) hours as needed for nausea.   prednisoLONE acetate 1 % ophthalmic suspension Commonly known as: PRED FORTE Place 1 drop  into the right eye daily.   promethazine 12.5 MG tablet Commonly known as: PHENERGAN Take 1 tablet (12.5 mg total) by mouth 2 (two) times daily as needed for nausea or vomiting.   Restasis 0.05 % ophthalmic emulsion Generic drug: cycloSPORINE Place 1 drop into both eyes 2 (two) times daily.   rosuvastatin 10 MG tablet Commonly known as: CRESTOR TAKE ONE TABLET EVERY DAY.   traZODone 50 MG tablet Commonly known as: DESYREL TAKE ONE TABLET AT BEDTIME AS  NEEDED FORSLEEP. What changed: See the new instructions.        Follow-up Information     Sindy Guadeloupe, MD Follow up.   Specialty: Oncology Why: They will call you for an appointment, if you have not heard by early next week, call this number Contact information: [place] [address] [phone]         [name], MD. Schedule an appointment as soon as possible for a visit in 1 week(s).   Specialty: Internal Medicine Contact information: [place] [address] [phone]                 Discharge Instructions     Discharge instructions   Complete by: As directed    IMPORTANT DISCHARGE INSTRUCTIONS: From Dr. Loleta Books: You were admitted for evaluation of your lung mass. You had a bronchoscopy by Dr. Silas Flood and biopsy sent You were evaluated by the Medical Oncology team (Dr. Alvy Bimler) and the Radiation Oncology team (Dr. Tammi Klippel)  Follow up with Radiation Oncology tomorrow as instructed  Dr. Janese Banks will reach out to you for a follow up appointment, if you haven't heard from her, call the number below  For nausea: Take ondansetron 4-8 mg as needed up to three times a day  If ondansetron/Zofran 8 mg (two tabs) doesn't work, either use Phenergan (if you have this at home still) or call your primary care doctor to ask about a prescription (for example, for Compazine, which you took here in the hospital)  Take dexamethasone 2 mg twice daily (morning and night) until Dr. Janese Banks tells you to stop   Increase activity slowly   Complete by: As directed    No wound care   Complete by: As directed        Discharge Exam: Filed Weights   03/20/22 1352  Weight: 77 kg    General: Pt is alert, awake, not in acute distress, appears tired Cardiovascular: RRR, nl S1-S2, no murmurs appreciated.   No LE edema.   Respiratory: Normal respiratory rate and rhythm.  CTAB without rales or wheezes. Abdominal: Abdomen soft  and non-tender.  No distension or HSM.   Neuro/Psych: Strength symmetric in upper and lower extremities.  Judgment and insight appear normal.   Condition at discharge: stable  The results of significant diagnostics from this hospitalization (including imaging, microbiology, ancillary and laboratory) are listed below for reference.   Imaging Studies: IR IMAGING GUIDED PORT INSERTION  Result Date: 03/27/2022 CLINICAL DATA:  Metastatic disease of unknown primary EXAM: RIGHT INTERNAL JUGULAR SINGLE LUMEN POWER PORT CATHETER INSERTION Date:  03/26/2022 03/26/2022 5:32 pm Radiologist:  Jerilynn Mages. Daryll Brod, MD Guidance:  Ultrasound and fluoroscopic MEDICATIONS: 1% lidocaine local with epinephrine ANESTHESIA/SEDATION: Versed 1.0 mg IV; Fentanyl 50 mcg IV; Moderate Sedation Time:  23 minutes The patient was continuously monitored during the procedure by the interventional radiology nurse under my direct supervision. FLUOROSCOPY: One minutes, 6 seconds (4 mGy) COMPLICATIONS: None immediate. CONTRAST:  None PROCEDURE: Informed consent was obtained from the patient following explanation of the procedure, risks, benefits and alternatives. The patient understands, agrees and consents for the procedure. All questions were addressed. A time out was performed. Maximal barrier sterile technique utilized including caps, mask, sterile gowns, sterile gloves, large sterile drape, hand hygiene, and 2% chlorhexidine scrub. Under sterile conditions and local anesthesia, right internal jugular micropuncture venous access was performed. Access was performed with ultrasound. Images were obtained for documentation of the patent right internal jugular vein. A guide wire was inserted followed by a transitional dilator. This allowed insertion of a guide wire and catheter into the IVC. Measurements were obtained from the SVC / RA junction back to the right IJ venotomy site. In the right infraclavicular chest, a subcutaneous pocket was  created over the second anterior rib. This was done under sterile conditions and local anesthesia. 1% lidocaine with epinephrine was utilized for this. A 2.5 cm incision was made in the skin. Blunt dissection was performed to create a subcutaneous pocket over the right pectoralis major muscle. The pocket was flushed with saline vigorously. There was adequate hemostasis. The port catheter was assembled and checked for leakage. The port catheter was secured in the pocket with two retention sutures. The tubing was tunneled subcutaneously to the right venotomy site and inserted into the SVC/RA junction through a valved peel-away sheath. Position was confirmed with fluoroscopy. Images were obtained for documentation. The patient tolerated the procedure well. No immediate complications. Incisions were closed in a two layer fashion with 4 - 0 Vicryl suture. Dermabond was applied to the skin. The port catheter was accessed, blood was aspirated followed by saline and heparin flushes. Needle was removed. A dry sterile dressing was applied. IMPRESSION: Ultrasound and fluoroscopically guided right internal jugular single lumen power port catheter insertion. Tip in the SVC/RA junction. Catheter ready for use. Electronically Signed   By: Jerilynn Mages.  Shick M.D.   On: 03/27/2022 08:16   DG Chest 1 View  Result Date: 03/25/2022 CLINICAL DATA:  Post bronchoscopy EXAM: CHEST  1 VIEW COMPARISON:  03/14/2022, CT 03/20/2022 FINDINGS: Elevation of the right diaphragm with atelectasis at the right base. Small right-sided pleural effusion. No definitive pneumothorax. Right hilar fullness likely corresponds to enlarged hilar nodes on CT. Pulmonary nodules on CT are not well demonstrated radiographically. Stable cardiomediastinal silhouette. IMPRESSION: 1. Elevated right diaphragm with small right effusion and right basilar probable atelectasis. 2. Negative for pneumothorax. Electronically Signed   By: Donavan Foil M.D.   On: 03/25/2022 15:18    MR BRAIN W WO CONTRAST  Result Date: 03/22/2022 CLINICAL DATA:  Follow-up occipital skull lesion. EXAM: MRI HEAD WITHOUT AND WITH CONTRAST TECHNIQUE: Multiplanar, multiecho pulse sequences of the brain and surrounding structures were obtained without and with intravenous contrast. CONTRAST:  7.80mL GADAVIST GADOBUTROL 1 MMOL/ML IV SOLN COMPARISON:  Brain MRI from 4 days ago. FINDINGS: Brain: No evidence of brain metastasis or acute infarct. Mild occipital vasogenic edema on the left adjacent to extra-axial lesion which deforms the brain at this level. No hydrocephalus or collection. Vascular: See below concerning venous structures. Skull and upper cervical spine: Heterogeneously enhancing calvarial mass extending through both inner and outer cortex, up to 3.6 cm on sagittal postcontrast images. There is affect on the dural sinuses at the lower superior sagittal sinus and torcula. A small rightward channel of superior sagittal sinus is likely patent into the transverse dural sinus on the right. Left-sided transverse sinus filling is less apparent. The torcula  is up lifted and narrowed. Two other calvarial metastasis are seen in the parietal bones, on the right with probable extension into the outer cortex and scalp, 18 mm on 17:126. On diffusion imaging there small indeterminate areas of signal abnormality in the right frontal and lower parietal regions as marked on series 5 Sinuses/Orbits: Bilateral mastoid opacification with negative nasopharynx. Right cataract resection. Other: IMPRESSION: 1. Multifocal calvarial metastatic disease greatest in the midline occipital bone where tumor extends through the inner table and invades the lower superior sagittal sinus and torcula. A small channel of patent superior sagittal sinus is seen extending into the dominant right transverse sinus. 2. Left occipital pole vasogenic edema where contacting #1. 3. An 18 mm high right parietal bone metastasis extends through outer  cortex. 4. Negative for parenchymal metastasis. Electronically Signed   By: Jorje Guild M.D.   On: 03/22/2022 11:34   CT CHEST ABDOMEN PELVIS W CONTRAST  Result Date: 03/20/2022 CLINICAL DATA:  Patient presents with multiple calvarial lesions concerning for metastatic disease. EXAM: CT CHEST, ABDOMEN, AND PELVIS WITH CONTRAST TECHNIQUE: Multidetector CT imaging of the chest, abdomen and pelvis was performed following the standard protocol during bolus administration of intravenous contrast. RADIATION DOSE REDUCTION: This exam was performed according to the departmental dose-optimization program which includes automated exposure control, adjustment of the mA and/or kV according to patient size and/or use of iterative reconstruction technique. CONTRAST:  26mL OMNIPAQUE IOHEXOL 350 MG/ML SOLN COMPARISON:  None Available. FINDINGS: CT CHEST FINDINGS Cardiovascular: Heart size appears normal. No significant pericardial effusion identified. Mediastinum/Nodes: Thyroid gland, trachea and esophagus are unremarkable. Multiple enlarged right mediastinal, multiple enlarged thoracic lymph nodes are identified which are predominantly right-sided. These include: -right internal mammary lymph node measures 1.1 cm, image 27/4. -right paratracheal lymph node measures 2.3 cm, image 24/4. -subcarinal lymph node measures 1.9 cm, image 30/4. -right hilar node measures 1.7 cm, image 27/4. Lungs/Pleura: Small right pleural effusion identified, image 32/4. Subsegmental atelectasis is identified within the basilar right middle lobe and posterior right lower lobe. Bilateral pulmonary nodules are identified, including: -dominant spiculated nodule within the periphery of the right upper lobe measures 0.9 cm , image 59/5. -Nodule within the anteromedial right upper lobe measures 0.4 cm, image 65/5. Perifissural nodule along the minor fissure measures 3 mm, image 64/5. -anteromedial left upper lobe nodule measures 4 mm, image 63/5.  Musculoskeletal: Multifocal lucent bone metastases are noted. -Within the left side of the T11 vertebra there is a lucent bone lesion measuring 1.6 cm, image 106/6. -Within the left lateral T7 vertebra there is a lucent bone lesion measuring 1.1 cm, image 79/5. -Lytic lesion with pathologic fracture involving the lateral aspect of the right fourth rib is identified, image 60/5. CT ABDOMEN PELVIS FINDINGS Hepatobiliary: No focal liver abnormality is seen. No gallstones, gallbladder wall thickening, or biliary dilatation. Pancreas: Unremarkable. No pancreatic ductal dilatation or surrounding inflammatory changes. Spleen: Normal in size without focal abnormality. Adrenals/Urinary Tract: Right adrenal mass measures 3.6 x 2.0 cm, image 52/4. Normal left adrenal gland. No kidney mass or hydronephrosis identified. Urinary bladder is unremarkable. Stomach/Bowel: Stomach appears normal. The appendix is visualized and is within normal limits. No bowel wall thickening, inflammation, or distension. Vascular/Lymphatic: No significant vascular findings are present. No enlarged abdominal or pelvic lymph nodes. Reproductive: Uterus and bilateral adnexa are unremarkable. Other: No ascites or fluid collection. No peritoneal nodularity identified. Musculoskeletal: Lucent bone lesion within the left iliac wing measures 1.3 cm, image 96/4. Lucent lesion within the right  iliac bone adjacent to the SI joint measures 1.1 cm, image 91/4. Status post right hip arthroplasty. IMPRESSION: 1. Enlarged right-sided mediastinal, hilar and subcarinal lymph nodes are identified compatible with metastatic adenopathy. 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 2. Multiple small lung nodules are identified bilaterally. The dominant nodule is in the periphery of the right upper lobe. 3. Right adrenal mass, suspicious for metastatic disease. 4. Multifocal lucent bone metastases involving the thoracic spine, right fourth rib,  and bilateral iliac bones. Pathologic fracture involving the lateral aspect of the right fourth rib. 5. Small right pleural effusion with subsegmental atelectasis within the basilar right middle lobe and posterior right lower lobe. Electronically Signed   By: Kerby Moors M.D.   On: 03/20/2022 10:28   CT Head W or Wo Contrast  Result Date: 03/19/2022 CLINICAL DATA:  Intracranial metastatic disease EXAM: CT HEAD WITHOUT AND WITH CONTRAST TECHNIQUE: Contiguous axial images were obtained from the base of the skull through the vertex without and with intravenous contrast. RADIATION DOSE REDUCTION: This exam was performed according to the departmental dose-optimization program which includes automated exposure control, adjustment of the mA and/or kV according to patient size and/or use of iterative reconstruction technique. CONTRAST:  56mL OMNIPAQUE IOHEXOL 350 MG/ML SOLN COMPARISON:  Brain MRI 03/18/2022 FINDINGS: Brain: There is a posterior paramedian extra-axial mass near the torcula herophili with edema of the adjacent left occipital lobe. The intracranial portion of the mass measures 1.6 x 0.9 cm. There are no enhancing intraparenchymal lesions. Vascular: Filling defect at the torcula herophili extending partially into the superior sagittal sinus. Skull: Moth-eaten appearance of the occipital bone at the site of the above-described mass. The mass extends to the extracranial space with a dome-shaped lesion measuring 1.2 x 0.6 cm. Sinuses/Orbits: No fluid levels or advanced mucosal thickening of the visualized paranasal sinuses. No mastoid or middle ear effusion. The orbits are normal. IMPRESSION: 1. 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 2. Edema of the left occipital lobe, adjacent to the above-described mass. 3. Filling defect at the torcula herophili extending partially into the superior sagittal sinus,  concerning for tumor invasion. Electronically Signed   By: Ulyses Jarred M.D.   On: 03/19/2022 23:30   MR Brain Wo Contrast  Result Date: 03/18/2022 CLINICAL DATA:  Acute intractable headache.  Nausea and dizziness. EXAM: MRI HEAD WITHOUT CONTRAST TECHNIQUE: Multiplanar, multiecho pulse sequences of the brain and surrounding structures were obtained without intravenous contrast. COMPARISON:  MRI head 04/06/2015 FINDINGS: Brain: Ventricle size and cerebral volume normal. Negative for acute infarct. Mild edema in the left occipital lobe. There is associated occipital bone lesion with extraosseous mass extending intracranially which appears associated with the left occipital lobe edema. No other edema. Vascular: Negative for hyperdense vessel Skull and upper cervical spine: Multiple skull lesions. Largest lesion in the occipital bone posteriorly measures 39 x 26 mm. This contains susceptibility likely due to bony fragmentation in the mass. Smaller lesions are seen in the high parietal bone bilaterally. Central skull base intact. Sinuses/Orbits: Paranasal sinuses clear. Bilateral mastoid effusion. Right cataract extraction Other: None IMPRESSION: 1. Negative for acute infarct. 2. Multiple skull lesions. The largest lesion is in the occipital bone, with adjacent intracranial extension of tumor. The smaller lesions are seen in the high parietal bone bilaterally. 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 3. There is extraosseous extension of tumor in the left occipital lobe with edema in the left  occipital lobe. 4. CT head   with contrast recommended for further evaluation. Electronically Signed   By: Franchot Gallo M.D.   On: 03/18/2022 17:19   DG Chest 2 View  Result Date: 03/14/2022 CLINICAL DATA:  Chest pain and shortness of breath EXAM: CHEST - 2 VIEW COMPARISON:  None Available. FINDINGS: The heart size and mediastinal contours are within normal limits. Both lungs are clear. The visualized skeletal  structures are unremarkable. IMPRESSION: No acute cardiopulmonary abnormality. Electronically Signed   By: Beryle Flock M.D.   On: 03/14/2022 11:58    Microbiology: Results for orders placed or performed during the hospital encounter of 10/30/20  SARS CORONAVIRUS 2 (TAT 6-24 HRS) Nasopharyngeal Nasopharyngeal Swab     Status: None   Collection Time: 10/30/20 10:41 AM   Specimen: Nasopharyngeal Swab  Result Value Ref Range Status   SARS Coronavirus 2 NEGATIVE NEGATIVE Final    Comment: (NOTE) SARS-CoV-2 target nucleic acids are NOT DETECTED.  The SARS-CoV-2 RNA is generally detectable in upper and lower respiratory specimens during the acute phase of infection. Negative results do not preclude SARS-CoV-2 infection, do not rule out co-infections with other pathogens, and should not be used as the sole basis for treatment or other patient management decisions. Negative results must be combined with clinical observations, patient history, and epidemiological information. The expected result is Negative.  Fact Sheet for Patients: SugarRoll.be  Fact Sheet for Healthcare Providers: https://www.woods-mathews.com/  This test is not yet approved or cleared by the [place] FDA and  has been authorized for detection and/or diagnosis of SARS-CoV-2 by FDA under an Emergency Use Authorization (EUA). This EUA will remain  in effect (meaning this test can be used) for the duration of the COVID-19 declaration under Se ction 564(b)(1) of the Act, 21 U.S.C. section 360bbb-3(b)(1), unless the authorization is terminated or revoked sooner.  Performed at [facility], [place] [address], [place], [place] [address]     Labs: CBC: Recent Labs  Lab 03/21/22 0535 03/22/22 0511 03/24/22 0430 03/25/22 0602 03/26/22 0629  WBC 9.9 11.8* 12.1* 12.4* 14.7*  NEUTROABS  --   --   --  9.3* 11.4*  HGB 14.3 14.1 13.3 14.3 14.0  HCT 42.5 42.2 39.6 42.3  41.0  MCV 94.0 95.0 95.4 93.2 93.4  PLT 262 265 236 244 209   Basic Metabolic Panel: Recent Labs  Lab 03/21/22 0535 03/22/22 0511 03/24/22 0430 03/25/22 0602 03/26/22 0629  NA 139 140 138 138 138  K 3.8 3.6 3.4* 3.2* 4.0  CL 103 105 104 104 104  CO2 27 28 28 25 27   GLUCOSE 117* 108* 113* 102* 107*  BUN 15 16 12 11 13   CREATININE 0.74 0.71 0.66 0.57 0.63  CALCIUM 9.3 9.0 9.1 9.3 9.3  MG  --  2.3 2.5* 2.4 2.2   Liver Function Tests: No results for input(s): "AST", "ALT", "ALKPHOS", "BILITOT", "PROT", "ALBUMIN" in the last 168 hours. CBG: No results for input(s): "GLUCAP" in the last 168 hours.  Discharge time spent: approximately 35 minutes spent on discharge counseling, evaluation of patient on day of discharge, and coordination of discharge planning with nursing, social work, pharmacy and case management  Signed: Edwin Dada, MD [facility] 03/27/2022

## 2022-03-27 NOTE — Progress Notes (Signed)
Met with husband and wife in hospital room.  Discussed results of bronchoscopic biopsy.  This did demonstrate what we feared.  Mucinous adenocarcinoma or lung cancer.  They expressed understanding.

## 2022-03-27 NOTE — Progress Notes (Signed)
Patient discharged to home with family, discharge instructions reviewed with patient who verbalized understanding. 

## 2022-03-27 NOTE — Progress Notes (Signed)
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  She started first dose of radiation yesterday She is feeling better She can be discharged with outpatient follow-up with Dr. Janese Banks at [facility]   dizziness and headaches Continue radiation therapy and dexamethasone   Nausea and constipation, resolved I recommend scheduled laxatives   Discharge planning  I am hopeful she can be discharged later today.  Plan of care is discussed with patient and her husband  All questions were answered. The patient knows to call the clinic with any problems, questions or concerns.   All questions were answered. The patient knows to call the clinic with any problems, questions or concerns.   The total time spent in the appointment was 30 minutes encounter with patients including review of chart and various tests results, discussions about plan of care and coordination of care plan  Heath Lark, MD 03/27/2022 8:14 AM  Subjective:  She is seen in her room.  Her husband is by the bedside.  She is feeling better.  Constipation has resolved.  Denies nausea or vomiting.  Objective:  Vitals:   03/26/22 2223 03/27/22 0547  BP: (!) 154/77 (!) 160/84  Pulse: 99 88  Resp: 18 18  Temp: 98.5 F (36.9 C) 98.3 F (36.8 C)  SpO2: 94% 93%    No intake or output data in the 24 hours ending 03/27/22 0814  GENERAL:alert, no distress and comfortable NEURO: alert & oriented x 3 with fluent speech, no focal motor/sensory deficits   Labs:  Recent Labs    02/05/22 1236 03/14/22 1115 03/19/22 1950 03/21/22 0535 03/24/22 0430 03/25/22 0602 03/26/22 0629  NA 140   < > 138   < > 138 138 138  K 4.0   < > 3.6   < > 3.4* 3.2* 4.0  CL 104   < > 100   < > 104 104 104  CO2 25   < > 26   < > 28 25 27   GLUCOSE 79   < > 129*   < > 113*  102* 107*  BUN 17   < > 16   < > 12 11 13   CREATININE 0.77   < > 0.88   < > 0.66 0.57 0.63  CALCIUM 9.8   < > 10.2   < > 9.1 9.3 9.3  GFRNONAA  --    < > >60   < > >60 >60 >60  PROT 7.2  --  7.2  --   --   --   --   ALBUMIN 4.4  --  3.9  --   --   --   --   AST 17  --  83*  --   --   --   --   ALT 16  --  87*  --   --   --   --   ALKPHOS 60  --  56  --   --   --   --   BILITOT 0.4  --  0.5  --   --   --   --    < > = values in this interval not displayed.    Studies:  DG Chest 1 View  Result Date: 03/25/2022 CLINICAL DATA:  Post bronchoscopy EXAM: CHEST  1 VIEW COMPARISON:  03/14/2022, CT 03/20/2022 FINDINGS: Elevation of the right diaphragm with atelectasis at the right base. Small right-sided pleural effusion. No definitive pneumothorax. Right hilar fullness likely corresponds to enlarged hilar nodes on CT. Pulmonary nodules on CT are not well demonstrated radiographically. Stable cardiomediastinal silhouette. IMPRESSION: 1. Elevated right diaphragm with small right effusion and right basilar probable atelectasis. 2. Negative for pneumothorax. Electronically Signed   By: Donavan Foil M.D.   On: 03/25/2022 15:18   MR BRAIN W WO CONTRAST  Result Date: 03/22/2022 CLINICAL DATA:  Follow-up occipital skull lesion. EXAM: MRI HEAD WITHOUT AND WITH CONTRAST TECHNIQUE: Multiplanar, multiecho pulse sequences of the brain and surrounding structures were obtained without and with intravenous contrast. CONTRAST:  7.31mL GADAVIST GADOBUTROL 1 MMOL/ML IV SOLN COMPARISON:  Brain MRI from 4 days ago. FINDINGS: Brain: No evidence of brain metastasis or acute infarct. Mild occipital vasogenic edema on the left adjacent to extra-axial lesion which deforms the brain at this level. No hydrocephalus or collection. Vascular: See below concerning venous structures. Skull and upper cervical spine: Heterogeneously enhancing calvarial mass extending through both inner and outer cortex, up to 3.6 cm on sagittal  postcontrast images. There is affect on the dural sinuses at the lower superior sagittal sinus and torcula. A small rightward channel of superior sagittal sinus is likely patent into the transverse dural sinus on the right. Left-sided transverse sinus filling is less apparent. The torcula is up lifted and narrowed. Two other calvarial metastasis are seen in the parietal bones, on the right with probable extension into the outer cortex and scalp, 18 mm on 17:126. On diffusion imaging there small indeterminate areas of signal abnormality in the right frontal and lower parietal regions as marked on series 5 Sinuses/Orbits: Bilateral mastoid opacification with negative nasopharynx. Right cataract resection. Other: IMPRESSION: 1. Multifocal calvarial metastatic disease greatest in the midline occipital bone where tumor extends through the inner table and invades the lower superior sagittal sinus and torcula. A small channel of patent superior sagittal sinus is seen extending into the dominant right transverse sinus. 2. Left occipital pole vasogenic edema where contacting #1. 3. An 18 mm high right parietal bone metastasis extends through outer cortex. 4. Negative for parenchymal metastasis. Electronically Signed   By: Jorje Guild M.D.   On: 03/22/2022 11:34   CT CHEST ABDOMEN PELVIS W CONTRAST  Result Date: 03/20/2022 CLINICAL DATA:  Patient presents with multiple calvarial lesions concerning for metastatic disease. EXAM: CT CHEST, ABDOMEN, AND PELVIS WITH CONTRAST TECHNIQUE: Multidetector CT imaging of the chest, abdomen and pelvis was performed following the standard protocol during bolus administration of intravenous contrast. RADIATION DOSE REDUCTION: This exam was performed according to the departmental dose-optimization program which includes automated exposure control, adjustment of the mA and/or kV according to patient size and/or use of iterative reconstruction technique. CONTRAST:  48mL OMNIPAQUE  IOHEXOL 350 MG/ML SOLN COMPARISON:  None Available. FINDINGS: CT CHEST FINDINGS Cardiovascular: Heart size appears normal. No significant pericardial effusion identified. Mediastinum/Nodes: Thyroid gland, trachea and esophagus are unremarkable. Multiple enlarged right mediastinal, multiple enlarged thoracic lymph nodes are identified which are predominantly right-sided. These include: -right internal mammary lymph node measures 1.1 cm, image 27/4. -right paratracheal lymph node measures 2.3 cm, image 24/4. -subcarinal lymph node measures 1.9 cm, image 30/4. -right hilar node measures 1.7 cm, image 27/4. Lungs/Pleura: Small right pleural effusion identified, image 32/4. Subsegmental atelectasis is identified within the basilar right middle lobe and posterior right lower lobe. Bilateral pulmonary nodules are identified, including: -dominant  spiculated nodule within the periphery of the right upper lobe measures 0.9 cm , image 59/5. -Nodule within the anteromedial right upper lobe measures 0.4 cm, image 65/5. Perifissural nodule along the minor fissure measures 3 mm, image 64/5. -anteromedial left upper lobe nodule measures 4 mm, image 63/5. Musculoskeletal: Multifocal lucent bone metastases are noted. -Within the left side of the T11 vertebra there is a lucent bone lesion measuring 1.6 cm, image 106/6. -Within the left lateral T7 vertebra there is a lucent bone lesion measuring 1.1 cm, image 79/5. -Lytic lesion with pathologic fracture involving the lateral aspect of the right fourth rib is identified, image 60/5. CT ABDOMEN PELVIS FINDINGS Hepatobiliary: No focal liver abnormality is seen. No gallstones, gallbladder wall thickening, or biliary dilatation. Pancreas: Unremarkable. No pancreatic ductal dilatation or surrounding inflammatory changes. Spleen: Normal in size without focal abnormality. Adrenals/Urinary Tract: Right adrenal mass measures 3.6 x 2.0 cm, image 52/4. Normal left adrenal gland. No kidney mass or  hydronephrosis identified. Urinary bladder is unremarkable. Stomach/Bowel: Stomach appears normal. The appendix is visualized and is within normal limits. No bowel wall thickening, inflammation, or distension. Vascular/Lymphatic: No significant vascular findings are present. No enlarged abdominal or pelvic lymph nodes. Reproductive: Uterus and bilateral adnexa are unremarkable. Other: No ascites or fluid collection. No peritoneal nodularity identified. Musculoskeletal: Lucent bone lesion within the left iliac wing measures 1.3 cm, image 96/4. Lucent lesion within the right iliac bone adjacent to the SI joint measures 1.1 cm, image 91/4. Status post right hip arthroplasty. IMPRESSION: 1. Enlarged right-sided mediastinal, hilar and subcarinal lymph nodes are identified compatible with metastatic adenopathy. 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 2. Multiple small lung nodules are identified bilaterally. The dominant nodule is in the periphery of the right upper lobe. 3. Right adrenal mass, suspicious for metastatic disease. 4. 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 Pathologic fracture involving the lateral aspect of the right fourth rib. 5. Small right pleural effusion with subsegmental atelectasis within the basilar right middle lobe and posterior right lower lobe. Electronically Signed   By: Kerby Moors M.D.   On: 03/20/2022 10:28   CT Head W or Wo Contrast  Result Date: 03/19/2022 CLINICAL DATA:  Intracranial metastatic disease EXAM: CT HEAD WITHOUT AND WITH CONTRAST TECHNIQUE: Contiguous axial images were obtained from the base of the skull through the vertex without and with intravenous contrast. RADIATION DOSE REDUCTION: This exam was performed according to the departmental dose-optimization program which includes automated exposure control, adjustment of the mA and/or kV according to patient size  and/or use of iterative reconstruction technique. CONTRAST:  49mL OMNIPAQUE IOHEXOL 350 MG/ML SOLN COMPARISON:  Brain MRI 03/18/2022 FINDINGS: Brain: There is a posterior paramedian extra-axial mass near the torcula herophili with edema of the adjacent left occipital lobe. The intracranial portion of the mass measures 1.6 x 0.9 cm. There are no enhancing intraparenchymal lesions. Vascular: Filling defect at the torcula herophili extending partially into the superior sagittal sinus. Skull: Moth-eaten appearance of the occipital bone at the site of the above-described mass. The mass extends to the extracranial space with a dome-shaped lesion measuring 1.2 x 0.6 cm. Sinuses/Orbits: No fluid levels or advanced mucosal thickening of the visualized paranasal sinuses. No mastoid or middle ear effusion. The orbits are normal. IMPRESSION: 1. 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 2. Edema of the left occipital lobe, adjacent to the  above-described mass. 3. Filling defect at the torcula herophili extending partially into the superior sagittal sinus, concerning for tumor invasion. Electronically Signed   By: Ulyses Jarred M.D.   On: 03/19/2022 23:30   MR Brain Wo Contrast  Result Date: 03/18/2022 CLINICAL DATA:  Acute intractable headache.  Nausea and dizziness. EXAM: MRI HEAD WITHOUT CONTRAST TECHNIQUE: Multiplanar, multiecho pulse sequences of the brain and surrounding structures were obtained without intravenous contrast. COMPARISON:  MRI head 04/06/2015 FINDINGS: Brain: Ventricle size and cerebral volume normal. Negative for acute infarct. Mild edema in the left occipital lobe. There is associated occipital bone lesion with extraosseous mass extending intracranially which appears associated with the left occipital lobe edema. No other edema. Vascular: Negative for hyperdense vessel Skull and upper cervical spine:  Multiple skull lesions. Largest lesion in the occipital bone posteriorly measures 39 x 26 mm. This contains susceptibility likely due to bony fragmentation in the mass. Smaller lesions are seen in the high parietal bone bilaterally. Central skull base intact. Sinuses/Orbits: Paranasal sinuses clear. Bilateral mastoid effusion. Right cataract extraction Other: None IMPRESSION: 1. Negative for acute infarct. 2. Multiple skull lesions. The largest lesion is in the occipital bone, with adjacent intracranial extension of tumor. The smaller lesions are seen in the high parietal bone bilaterally. 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 3. 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 4. CT head   with contrast recommended for further evaluation. Electronically Signed   By: Franchot Gallo M.D.   On: 03/18/2022 17:19   DG Chest 2 View  Result Date: 03/14/2022 CLINICAL DATA:  Chest pain and shortness of breath EXAM: CHEST - 2 VIEW COMPARISON:  None Available. FINDINGS: The heart size and mediastinal contours are within normal limits. Both lungs are clear. The visualized skeletal structures are unremarkable. IMPRESSION: No acute cardiopulmonary abnormality. Electronically Signed   By: Beryle Flock M.D.   On: 03/14/2022 11:58

## 2022-03-28 ENCOUNTER — Other Ambulatory Visit: Payer: Self-pay

## 2022-03-28 ENCOUNTER — Telehealth: Payer: Self-pay

## 2022-03-28 ENCOUNTER — Ambulatory Visit
Admission: RE | Admit: 2022-03-28 | Discharge: 2022-03-28 | Disposition: A | Discharge status: Home / Self Care | Payer: PPO | Source: Ambulatory Visit | Attending: Radiation Oncology | Admitting: Radiation Oncology

## 2022-03-28 DIAGNOSIS — C801 Malignant (primary) neoplasm, unspecified: Secondary | ICD-10-CM | POA: Diagnosis not present

## 2022-03-28 DIAGNOSIS — C7931 Secondary malignant neoplasm of brain: Secondary | ICD-10-CM

## 2022-03-28 DIAGNOSIS — Z510 Encounter for antineoplastic radiation therapy: Secondary | ICD-10-CM | POA: Insufficient documentation

## 2022-03-28 DIAGNOSIS — C7951 Secondary malignant neoplasm of bone: Secondary | ICD-10-CM | POA: Insufficient documentation

## 2022-03-28 LAB — RAD ONC ARIA SESSION SUMMARY
Course Elapsed Days: 2
Plan Fractions Treated to Date: 1
Plan Prescribed Dose Per Fraction: 6 Gy
Plan Total Fractions Prescribed: 4
Plan Total Prescribed Dose: 24 Gy
Reference Point Dosage Given to Date: 12 Gy
Reference Point Session Dosage Given: 1.2553 Gy
Session Number: 2

## 2022-03-28 NOTE — Telephone Encounter (Signed)
Transition Care Management Unsuccessful Follow-up Telephone Call  Date of discharge and from where:  03/27/22 [facility]  Attempts:  1st Attempt  Reason for unsuccessful TCM follow-up call:  No answer/busy. Will follow.

## 2022-03-28 NOTE — Progress Notes (Signed)
Patient to nursing for observation [place] [facility].  Denies headache, vision changes, ringing in ears, nuasea, and no skin irritation.  Reports feeling weak and fatigue related to not eating much.  Gait at this time in wheelchair for visit reports does use a cane or walker when home.  Decadron 2 mg bid.  Vitals:97.8-99-18-128/97 O2 sat 97% room air.  Left clinic with husband.

## 2022-03-30 ENCOUNTER — Encounter (HOSPITAL_COMMUNITY): Payer: Self-pay | Admitting: Pulmonary Disease

## 2022-03-31 ENCOUNTER — Telehealth: Payer: Self-pay | Admitting: Internal Medicine

## 2022-03-31 ENCOUNTER — Ambulatory Visit
Admission: RE | Admit: 2022-03-31 | Discharge: 2022-03-31 | Disposition: A | Discharge status: Home / Self Care | Payer: PPO | Source: Ambulatory Visit | Attending: Radiation Oncology | Admitting: Radiation Oncology

## 2022-03-31 ENCOUNTER — Other Ambulatory Visit: Payer: Self-pay

## 2022-03-31 DIAGNOSIS — Z510 Encounter for antineoplastic radiation therapy: Secondary | ICD-10-CM | POA: Diagnosis not present

## 2022-03-31 DIAGNOSIS — C7931 Secondary malignant neoplasm of brain: Secondary | ICD-10-CM

## 2022-03-31 LAB — RAD ONC ARIA SESSION SUMMARY
Course Elapsed Days: 5
Plan Fractions Treated to Date: 2
Plan Prescribed Dose Per Fraction: 6 Gy
Plan Total Fractions Prescribed: 4
Plan Total Prescribed Dose: 24 Gy
Reference Point Dosage Given to Date: 18 Gy
Reference Point Session Dosage Given: 6 Gy
Session Number: 3

## 2022-03-31 NOTE — Telephone Encounter (Signed)
Spoke with patient's spouse he declined AWV at this time

## 2022-03-31 NOTE — Telephone Encounter (Signed)
Transition Care Management Unsuccessful Follow-up Telephone Call  Date of discharge and from where:  03/27/22 [facility]  Attempts:  2nd Attempt  Reason for unsuccessful TCM follow-up call:  Unable to reach patient. Spoke with daughter and notes patient is unavailable to answer any questions right now and would rather wait to follow up at [place] appointment 04/01/22 @ 11:00. This qualifies and closes TCM attempts from [facility]. Keep all scheduled appointments.     Cauy Melody Motley LPN, [place], [place] Direct dial ??[phone]

## 2022-03-31 NOTE — Telephone Encounter (Signed)
noted 

## 2022-03-31 NOTE — Progress Notes (Signed)
Patient to nursing for 15 minute observation [place] [facility].  Reports feeling fatigue and nausea/vomiting.  Denies headache, visual changes, ringing in the ears, skin irritation.  Speech clear.  Continues on decadron 2 mg BID.  Left clinic via wheelchair with spouse pushing chair.  Advised not to do anything strenuous for next 24 hours.  Call [phone] with any questions or concerns.  Vitals:  98.0-84-18-141/71 O2 sat @ 96%.

## 2022-03-31 NOTE — Telephone Encounter (Signed)
Patient has been scheduled to see Dr. Deborra Medina on 04/01/2022 at 4:30pm for her hospital follow-up.

## 2022-04-01 ENCOUNTER — Ambulatory Visit (INDEPENDENT_AMBULATORY_CARE_PROVIDER_SITE_OTHER): Payer: PPO | Admitting: Internal Medicine

## 2022-04-01 ENCOUNTER — Encounter: Payer: Self-pay | Admitting: Internal Medicine

## 2022-04-01 VITALS — BP 132/82 | HR 90 | Temp 97.4°F | Ht 61.0 in | Wt 163.4 lb

## 2022-04-01 DIAGNOSIS — C7951 Secondary malignant neoplasm of bone: Secondary | ICD-10-CM | POA: Diagnosis not present

## 2022-04-01 DIAGNOSIS — C3490 Malignant neoplasm of unspecified part of unspecified bronchus or lung: Secondary | ICD-10-CM | POA: Diagnosis not present

## 2022-04-01 DIAGNOSIS — R112 Nausea with vomiting, unspecified: Secondary | ICD-10-CM

## 2022-04-01 DIAGNOSIS — Z09 Encounter for follow-up examination after completed treatment for conditions other than malignant neoplasm: Secondary | ICD-10-CM

## 2022-04-01 DIAGNOSIS — R110 Nausea: Secondary | ICD-10-CM

## 2022-04-01 DIAGNOSIS — M8448XA Pathological fracture, other site, initial encounter for fracture: Secondary | ICD-10-CM | POA: Diagnosis not present

## 2022-04-01 MED ORDER — PROMETHAZINE HCL 12.5 MG RE SUPP: 12.5000 mg | Freq: Four times a day (QID) | RECTAL | 0 refills | Status: DC | PRN

## 2022-04-01 MED ORDER — ALPRAZOLAM 0.5 MG PO TABS: 0.5000 mg | ORAL_TABLET | Freq: Two times a day (BID) | ORAL | 2 refills | Status: AC | PRN

## 2022-04-01 MED ORDER — PROMETHAZINE HCL 12.5 MG PO TABS: 12.5000 mg | ORAL_TABLET | Freq: Three times a day (TID) | ORAL | 0 refills | Status: AC | PRN

## 2022-04-01 MED FILL — Hydromorphone HCl Preservative Free (PF) Inj 1 MG/ML: INTRAMUSCULAR | Qty: 1 | Status: AC

## 2022-04-01 NOTE — Assessment & Plan Note (Addendum)
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   To see  Oncology Oct 26

## 2022-04-01 NOTE — Progress Notes (Signed)
Subjective:  Patient ID: Lorraine Mills, female    DOB: 13-Oct-1950  Age: 71 y.o. MRN: [id number]  CC: Diagnoses of Nausea and vomiting, unspecified vomiting type, Pathologic rib fracture, initial encounter, Metastasis to bone [facility]), Lung cancer metastatic to bone [facility]), Nausea, and Hospital discharge follow-up were pertinent to this visit.   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  She was treated with IV dexamethasone and began external beam radaition in house. She underwent  EBUS with transbronchial biopsy  of lung showing mucinous adenocarcinoma  Workup with imaging noted mets to adrenals, skull, ribs and vertebrae.  A porta cath was placed on her right chest wall and she was discharged home on Oct 19 with outpatient oncology follow up scheduled with Dr Janese Banks on October 26.    She is accompanied today by her daughter Lorraine Mills and her 67 week old granddaughter.  She reports that she continues to have persistent nausea,  loss of appetite and altered taste since discharge.  She has been using  4 mg zofran  with no improvement and states that her headaches are made worse by zofran.  Her throat is sore and her voice is slightly hoarse.  Her chest wall is slightly tender .  Her bowels are moving several times daily    Outpatient Medications Prior to Visit  Medication Sig Dispense Refill   acetaminophen (TYLENOL) 500 MG tablet Take 1,000 mg by mouth every 6 (six) hours as needed for moderate pain.     dexamethasone (DECADRON) 2 MG  tablet Take 1 tablet (2 mg total) by mouth 2 (two) times daily with a meal. 60 tablet 0   fenofibrate micronized (LOFIBRA) 134 MG capsule Take 1 capsule (134 mg total) by mouth daily before breakfast. 30 capsule 11   levothyroxine (SYNTHROID) 75 MCG tablet TAKE 1 TABLET EVERY DAY ON EMPTY STOMACHWITH A GLASS OF WATER AT LEAST 30-60 MINBEFORE BREAKFAST (Patient taking differently: Take 75 mcg by mouth daily before breakfast. On an empty stomach with a glass of water at least 30-60 minutes before breakfast.) 90 tablet 1   omeprazole (PRILOSEC) 20 MG capsule TAKE 1 CAPSULE BY MOUTH DAILY (Patient taking differently: Take 20 mg by mouth daily.) 30 capsule 3   ondansetron (ZOFRAN) 4 MG tablet Take 1 tablet (4 mg total) by mouth every 6 (six) hours as needed for nausea. 30 tablet 1   prednisoLONE acetate (PRED FORTE) 1 % ophthalmic suspension Place 1 drop into the right eye daily.     RESTASIS 0.05 % ophthalmic emulsion Place 1 drop into both eyes 2 (two) times daily.     rosuvastatin (CRESTOR) 10 MG tablet TAKE ONE TABLET EVERY DAY. (Patient taking differently: Take 10 mg by mouth daily.) 90 tablet 3   traZODone (DESYREL) 50 MG tablet TAKE ONE TABLET AT BEDTIME AS NEEDED FORSLEEP. (Patient taking differently: Take 50 mg by mouth at bedtime as needed for sleep.) 90 tablet 1   cetirizine (ZYRTEC) 10 MG tablet Take 10 mg by mouth  daily as needed for allergies. (Patient not taking: Reported on 04/01/2022)     fluticasone (FLONASE) 50 MCG/ACT nasal spray Place 1-2 sprays into both nostrils daily as needed for allergies or rhinitis. (Patient not taking: Reported on 04/01/2022)     promethazine (PHENERGAN) 12.5 MG tablet Take 1 tablet (12.5 mg total) by mouth 2 (two) times daily as needed for nausea or vomiting. (Patient not taking: Reported on 04/01/2022) 30 tablet 0   No facility-administered medications prior to visit.    Review of Systems;  Patient denies  fevers, malaise,, skin rash, eye pain, sinus  congestion and sinus pain,  dysphagia,  hemoptysis , cough, dyspnea, wheezing, chest pain, palpitations, orthopnea, edema, abdominal pain, , melena, diarrhea, constipation, flank pain, dysuria, hematuria, urinary  Frequency, nocturia, numbness, tingling, seizures,  Focal weakness, Loss of consciousness,  Tremor, insomnia, depression, anxiety, and suicidal ideation.      Objective:  BP 132/82 (BP Location: Left Arm, Patient Position: Sitting, Cuff Size: Normal)   Pulse 90   Temp (!) 97.4 F (36.3 C) (Oral)   Ht 5\' 1"  (1.549 m)   Wt 163 lb 6.4 oz (74.1 kg)   SpO2 94%   BMI 30.87 kg/m   BP Readings from Last 3 Encounters:  04/01/22 132/82  03/27/22 (!) 160/84  03/18/22 138/78    Wt Readings from Last 3 Encounters:  04/01/22 163 lb 6.4 oz (74.1 kg)  03/20/22 169 lb 12.1 oz (77 kg)  03/18/22 170 lb 9.6 oz (77.4 kg)    General appearance: alert, cooperative and appears stated age Ears: normal TM's and external ear canals both ears Throat: lips, mucosa, and tongue normal; teeth and gums normal Neck: no adenopathy, no carotid bruit, supple, symmetrical, trachea midline and thyroid not enlarged, symmetric, no tenderness/mass/nodules Back: symmetric, no curvature. ROM normal. No CVA tenderness. Lungs: clear to auscultation bilaterall Chest wall:  right chest wall with healing incision over portacath.  No erythema or drainage.  Heart: regular rate and rhythm, S1, S2 normal, no murmur, click, rub or gallop Abdomen: soft, non-tender; bowel sounds normal; no masses,  no organomegaly Pulses: 2+ and symmetric Skin: Skin color, texture, turgor normal. No rashes or lesions Lymph nodes: Cervical, supraclavicular, and axillary nodes normal. Neuro:  awake and interactive with normal mood and affect. Higher cortical functions are normal. Speech is clear without word-finding difficulty or dysarthria. Extraocular movements are intact. Visual fields of both eyes are grossly intact. Sensation to light  touch is grossly intact bilaterally of upper and lower extremities. Motor examination shows 4+/5 symmetric hand grip and upper extremity and 5/5 lower extremity strength. There is no pronation or drift. Gait is non-ataxic   Lab Results  Component Value Date   HGBA1C 5.5 10/04/2019   HGBA1C 5.6 02/09/2019   HGBA1C 5.5 03/26/2018    Lab Results  Component Value Date   CREATININE 0.63 03/26/2022   CREATININE 0.57 03/25/2022   CREATININE 0.66 03/24/2022    Lab Results  Component Value Date   WBC 14.7 (H) 03/26/2022   HGB 14.0 03/26/2022   HCT 41.0 03/26/2022   PLT 245 03/26/2022   GLUCOSE 107 (H) 03/26/2022   CHOL 195 02/05/2022   TRIG (H) 02/05/2022    493.0 Triglyceride is over 400; calculations on Lipids are invalid.   HDL 37.10 (L) 02/05/2022   LDLDIRECT 65.0 02/05/2022   LDLCALC 60 02/02/2020   ALT 87 (H) 03/19/2022   AST 83 (H) 03/19/2022   NA 138 03/26/2022   K 4.0 03/26/2022  CL 104 03/26/2022   CREATININE 0.63 03/26/2022   BUN 13 03/26/2022   CO2 27 03/26/2022   TSH 2.25 02/05/2022   INR 1.0 03/21/2022   HGBA1C 5.5 10/04/2019   MICROALBUR <0.7 03/20/2021    No results found.  Assessment & Plan:   Problem List Items Addressed This Visit     Pathologic rib fracture, initial encounter    Secondary to metastatic lung CA       Nausea    Persistent,  unresponsive to zofran , complicated by anxiety.  Phenergan and alprazolam prescribed.       Metastasis to bone [facility])    Including calvarium , but no parenchymal involvement by recent imaging .  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   To see  Oncology Oct 26      Relevant Medications   ALPRAZolam (XANAX) 0.5 MG tablet   Hospital discharge follow-up    Patient is stable  post discharge and has questions about discharge plans at the visit today for hospital follow up. All labs , imaging studies and progress notes from admission were reviewed with patient today  And medication changes were made to provide relief of nausea       Other Visit Diagnoses     Nausea and vomiting, unspecified vomiting type       Relevant Medications   promethazine (PHENERGAN) 12.5 MG tablet       I spent a total of   minutes with this patient in a face to face visit on the date of this encounter reviewing the last office visit with me in       ,  most recent visit with cardiology ,    ,  patient's diet and exercise habits, home blood pressure /blod sugar readings, recent ER visit including labs and imaging studies ,   and post visit ordering of testing and therapeutics.    Follow-up: Return in about 4 weeks (around 04/29/2022).   [name], MD

## 2022-04-01 NOTE — Assessment & Plan Note (Signed)
Persistent,  unresponsive to zofran , complicated by anxiety.  Phenergan and alprazolam prescribed.

## 2022-04-01 NOTE — Assessment & Plan Note (Signed)
Secondary to metastatic lung CA

## 2022-04-01 NOTE — Assessment & Plan Note (Signed)
Including calvarium , but no parenchymal involvement by recent imaging .  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

## 2022-04-01 NOTE — Assessment & Plan Note (Signed)
Patient is stable post discharge and has questions about discharge plans at the visit today for hospital follow up. All labs , imaging studies and progress notes from admission were reviewed with patient today  And medication changes were made to provide relief of nausea

## 2022-04-02 ENCOUNTER — Ambulatory Visit
Admission: RE | Admit: 2022-04-02 | Discharge: 2022-04-02 | Disposition: A | Discharge status: Home / Self Care | Payer: PPO | Source: Ambulatory Visit | Attending: Radiation Oncology | Admitting: Radiation Oncology

## 2022-04-02 ENCOUNTER — Other Ambulatory Visit: Payer: Self-pay

## 2022-04-02 VITALS — BP 141/77 | HR 90 | Temp 98.1°F | Resp 18

## 2022-04-02 DIAGNOSIS — Z510 Encounter for antineoplastic radiation therapy: Secondary | ICD-10-CM | POA: Diagnosis not present

## 2022-04-02 DIAGNOSIS — C7931 Secondary malignant neoplasm of brain: Secondary | ICD-10-CM

## 2022-04-02 LAB — RAD ONC ARIA SESSION SUMMARY
Course Elapsed Days: 7
Plan Fractions Treated to Date: 3
Plan Prescribed Dose Per Fraction: 6 Gy
Plan Total Fractions Prescribed: 4
Plan Total Prescribed Dose: 24 Gy
Reference Point Dosage Given to Date: 24 Gy
Reference Point Session Dosage Given: 6 Gy
Session Number: 4

## 2022-04-02 NOTE — Progress Notes (Signed)
Nurse monitoring complete status post 4 of 5 SRS treatments. Patient without complaints other than mild nausea. Patient denies new or worsening neurologic symptoms. Vitals stable. Instructed patient to avoid strenuous activity for the next 24 hours. Instructed patient to not miss any of her decadron doses. Instructed patient to call [phone] with needs related to treatment after hours or over the weekend. Patient verbalized understanding. Patient assisted out of clinic via wheelchair by patient's husband without incident  Vitals:   04/02/22 1540  BP: (!) 141/77  Pulse: 90  Resp: 18  Temp: 98.1 F (36.7 C)  SpO2: 98%

## 2022-04-03 ENCOUNTER — Encounter: Payer: Self-pay | Admitting: *Deleted

## 2022-04-03 ENCOUNTER — Encounter: Payer: Self-pay | Admitting: Internal Medicine

## 2022-04-03 ENCOUNTER — Encounter: Payer: Self-pay | Admitting: Oncology

## 2022-04-03 ENCOUNTER — Inpatient Hospital Stay: Payer: PPO | Attending: Oncology | Admitting: Oncology

## 2022-04-03 VITALS — BP 127/68 | HR 86 | Resp 16 | Wt 165.3 lb

## 2022-04-03 DIAGNOSIS — C3411 Malignant neoplasm of upper lobe, right bronchus or lung: Secondary | ICD-10-CM

## 2022-04-03 DIAGNOSIS — C3490 Malignant neoplasm of unspecified part of unspecified bronchus or lung: Secondary | ICD-10-CM | POA: Insufficient documentation

## 2022-04-03 DIAGNOSIS — Z7189 Other specified counseling: Secondary | ICD-10-CM

## 2022-04-03 DIAGNOSIS — E279 Disorder of adrenal gland, unspecified: Secondary | ICD-10-CM | POA: Insufficient documentation

## 2022-04-03 DIAGNOSIS — Z87891 Personal history of nicotine dependence: Secondary | ICD-10-CM | POA: Diagnosis not present

## 2022-04-03 DIAGNOSIS — C771 Secondary and unspecified malignant neoplasm of intrathoracic lymph nodes: Secondary | ICD-10-CM | POA: Diagnosis not present

## 2022-04-03 DIAGNOSIS — M899 Disorder of bone, unspecified: Secondary | ICD-10-CM | POA: Diagnosis not present

## 2022-04-03 DIAGNOSIS — C7951 Secondary malignant neoplasm of bone: Secondary | ICD-10-CM

## 2022-04-03 MED ORDER — LIDOCAINE-PRILOCAINE 2.5-2.5 % EX CREA: TOPICAL_CREAM | CUTANEOUS | 3 refills | Status: DC

## 2022-04-03 MED ORDER — ONDANSETRON HCL 8 MG PO TABS: 8.0000 mg | ORAL_TABLET | Freq: Three times a day (TID) | ORAL | 1 refills | Status: DC | PRN

## 2022-04-03 MED ORDER — FOLIC ACID 1 MG PO TABS: 1.0000 mg | ORAL_TABLET | Freq: Every day | ORAL | 3 refills | Status: DC

## 2022-04-03 MED ORDER — PROCHLORPERAZINE MALEATE 10 MG PO TABS: 10.0000 mg | ORAL_TABLET | Freq: Four times a day (QID) | ORAL | 1 refills | Status: DC | PRN

## 2022-04-03 MED ORDER — DEXAMETHASONE 4 MG PO TABS: ORAL_TABLET | ORAL | 1 refills | Status: DC

## 2022-04-03 NOTE — Progress Notes (Signed)
START OFF PATHWAY REGIMEN - Non-Small Cell Lung   [id number]:Carboplatin AUC=5 + Pemetrexed 500 mg/m2 q21 Days:   A cycle is every 21 days:     Pemetrexed      Carboplatin   **Always confirm dose/schedule in your pharmacy ordering system**  Patient Characteristics: Stage IV Metastatic, Nonsquamous, Awaiting Molecular Test Results and Need to Start Chemotherapy, PS = 0, 1 Therapeutic Status: Stage IV Metastatic Histology: Nonsquamous Cell Broad Molecular Profiling Status: Awaiting Molecular Test Results and Need to Start Chemotherapy ECOG Performance Status: 1 Intent of Therapy: Non-Curative / Palliative Intent, Discussed with Patient

## 2022-04-04 ENCOUNTER — Ambulatory Visit
Admission: RE | Admit: 2022-04-04 | Discharge: 2022-04-04 | Disposition: A | Discharge status: Home / Self Care | Payer: PPO | Source: Ambulatory Visit | Attending: Radiation Oncology | Admitting: Radiation Oncology

## 2022-04-04 ENCOUNTER — Encounter: Payer: Self-pay | Admitting: Urology

## 2022-04-04 ENCOUNTER — Encounter: Payer: Self-pay | Admitting: Oncology

## 2022-04-04 ENCOUNTER — Other Ambulatory Visit: Payer: Self-pay

## 2022-04-04 DIAGNOSIS — Z510 Encounter for antineoplastic radiation therapy: Secondary | ICD-10-CM | POA: Diagnosis not present

## 2022-04-04 DIAGNOSIS — C7931 Secondary malignant neoplasm of brain: Secondary | ICD-10-CM

## 2022-04-04 LAB — RAD ONC ARIA SESSION SUMMARY
Course Elapsed Days: 9
Plan Fractions Treated to Date: 4
Plan Prescribed Dose Per Fraction: 6 Gy
Plan Total Fractions Prescribed: 4
Plan Total Prescribed Dose: 24 Gy
Reference Point Dosage Given to Date: 30 Gy
Reference Point Session Dosage Given: 6 Gy
Session Number: 5

## 2022-04-04 MED ORDER — DEXAMETHASONE 2 MG PO TABS: 2.0000 mg | ORAL_TABLET | Freq: Every day | ORAL | 1 refills | Status: DC

## 2022-04-04 NOTE — Progress Notes (Signed)
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  MRI brain without contrast showed multiple skull lesions with the largest part in the occipital bone with adjacent intracranial extension of the tumor.  Smaller lesions in the high parietal bone bilaterally.  These most likely represent metastatic disease.  Extraosseous extension of the tumor in the left occipital lobe with edema.  CT chest abdomen and pelvis with contrast showed multiple enlarged mediastinal and thoracic lymph nodes in the right.  Bilateral lung nodules.  Lucent bone lesions in T7 and T11.  Right adrenal mass measuring 3.6 cm.  Bone lesions involving the left iliac and right iliac.  Patient underwent bronchoscopy and a biopsy of the hilar and mediastinal lymph nodes which was consistent with mucinous adenocarcinoma that was positive for CK7.  Negative for TTF-1 and Napsin A.  Negative for CK20 and CDX2.  This is compatible with lung primary however differential diagnosis includes GI or pancreaticobiliary origin.   Interval history- Patient received palliative radiation to her skull lesions.  She is currently here with her husband.  She reports ongoing fatigue.  Headache is gradually improving.  ECOG PS- 2 Pain scale- 3 Opioid associated constipation- no  Review of systems- Review of Systems  Constitutional:  Positive for malaise/fatigue. Negative for chills, fever and weight  loss.  HENT:  Negative for congestion, ear discharge and nosebleeds.   Eyes:  Negative for blurred vision.  Respiratory:  Positive for shortness of breath. Negative for cough, hemoptysis, sputum production and wheezing.   Cardiovascular:  Negative for chest pain, palpitations, orthopnea and claudication.  Gastrointestinal:  Negative for abdominal pain, blood in stool, constipation, diarrhea, heartburn, melena, nausea and vomiting.  Genitourinary:  Negative for dysuria, flank pain, frequency, hematuria and urgency.  Musculoskeletal:  Negative for back pain, joint pain and myalgias.  Skin:  Negative for rash.  Neurological:  Positive for headaches. Negative for dizziness, tingling, focal weakness, seizures and weakness.  Endo/Heme/Allergies:  Does not bruise/bleed easily.  Psychiatric/Behavioral:  Negative for depression and suicidal ideas. The patient does not have insomnia.       Allergies  Allergen Reactions   Latex Rash   Levofloxacin     FLUOROQUINOLONES - unknown reaction     Past Medical History:  Diagnosis Date   Anxiety    Arthritis    Depression 01/31/2016   Goiter    Hyperlipidemia    TGs   Hypothyroidism    Seasonal allergies      Past Surgical History:  Procedure Laterality Date   ABDOMINAL HYSTERECTOMY     supracervical    BILATERAL OOPHORECTOMY  2001   BENIGN TUMOR   BREAST BIOPSY Left 1985   cyst    BRONCHIAL NEEDLE ASPIRATION BIOPSY  03/25/2022   Procedure: BRONCHIAL NEEDLE ASPIRATION BIOPSIES;  Surgeon: Lanier Clam, MD;  Location: [facility] ENDOSCOPY;  Service: Endoscopy;;   CATARACT EXTRACTION Right    COLONOSCOPY  08/17/2009   COLONOSCOPY WITH PROPOFOL N/A 03/19/2016   Procedure: COLONOSCOPY WITH PROPOFOL;  Surgeon: Robert Bellow, MD;  Location: [facility] ENDOSCOPY;  Service: Endoscopy;  Laterality: N/A;   COLONOSCOPY WITH PROPOFOL N/A 05/15/2021   Procedure: COLONOSCOPY WITH PROPOFOL;  Surgeon: Robert Bellow, MD;  Location: [facility] ENDOSCOPY;   Service: Endoscopy;  Laterality: N/A;   CORNEAL TRANSPLANT Right    ENDOBRONCHIAL ULTRASOUND N/A 03/25/2022   Procedure: ENDOBRONCHIAL ULTRASOUND;  Surgeon: Lanier Clam, MD;  Location: [facility] ENDOSCOPY;  Service: Endoscopy;  Laterality: N/A;   EYE SURGERY     IR IMAGING GUIDED PORT INSERTION  03/26/2022   JOINT REPLACEMENT     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   Procedure: TOTAL HIP ARTHROPLASTY ANTERIOR APPROACH;  Surgeon: Hessie Knows, MD;  Location: [facility] ORS;  Service: Orthopedics;  Laterality: Right;   VIDEO BRONCHOSCOPY  03/25/2022   Procedure: VIDEO BRONCHOSCOPY WITHOUT FLUORO;  Surgeon: Lanier Clam, MD;  Location: [facility] ENDOSCOPY;  Service: Endoscopy;;    Social History   Socioeconomic History   Marital status: Married    Spouse name: Not on file   Number of children: Not on file   Years of education: Not on file   Highest education level: Not on file  Occupational History   Occupation: retired  Tobacco Use   Smoking status: Former    Packs/day: 1.00    Years: 24.00    Total pack years: 24.00    Types: Cigarettes    Quit date: 05/09/1998    Years since quitting: 23.9   Smokeless tobacco: Never  Vaping Use   Vaping Use: Never used  Substance and Sexual Activity   Alcohol use: Not Currently    Comment: rarely    Drug use: No   Sexual activity: Not on file  Other Topics Concern   Not on file  Social History Narrative   Not on file   Social Determinants of Health   Financial Resource Strain: Low Risk  (03/29/2021)   Overall Financial Resource Strain (CARDIA)    Difficulty of Paying Living Expenses: Not hard at all  Food Insecurity: No Food Insecurity (03/20/2022)   Hunger Vital Sign    Worried About Running Out of Food in the Last Year: Never true    [place] in the Last Year: Never true  Transportation Needs: No Transportation Needs (03/20/2022)   PRAPARE - Hydrologist (Medical): No    Lack of  Transportation (Non-Medical): No  Physical Activity: Insufficiently Active (03/29/2021)   Exercise Vital Sign    Days of Exercise per Week: 3 days    Minutes of Exercise per Session: 30 min  Stress: No Stress Concern Present (03/29/2021)   [place]    Feeling of Stress : Not at all  Social Connections: Unknown (03/29/2021)   Social Connection and Isolation Panel [NHANES]    Frequency of Communication with Friends and Family: Not on file    Frequency of Social Gatherings with Friends and Family: Not on file    Attends Religious Services: Not on file    Active Member of Clubs or Organizations: Not on file    Attends Archivist Meetings: Not on file    Marital Status: Married  Intimate Partner Violence: Not At Risk (03/20/2022)   Humiliation, Afraid, Rape, and Kick questionnaire    Fear of Current or Ex-Partner: No    Emotionally Abused: No    Physically Abused: No    Sexually Abused: No    Family History  Problem Relation Age of Onset   Cancer Mother        OVARIAN   Stroke Father    Cancer Sister        breast   Breast cancer Sister 59   Atrial fibrillation Brother    Thyroid disease Other        MULTIPLE FAMILY MEMBERS     Current Outpatient Medications:    acetaminophen (TYLENOL) 500 MG tablet, Take 1,000 mg by mouth every 6 (six) hours as needed for moderate pain., Disp: , Rfl:    ALPRAZolam (XANAX) 0.5 MG tablet, Take 1 tablet (0.5 mg total) by mouth 2 (two) times daily as needed for anxiety., Disp: 60 tablet, Rfl: 2   dexamethasone (DECADRON) 2 MG tablet, Take 1 tablet (2 mg total) by mouth 2 (two) times daily with a meal., Disp: 60 tablet, Rfl: 0   fenofibrate micronized (LOFIBRA) 134 MG capsule, Take 1 capsule (134 mg total) by mouth daily before breakfast., Disp: 30 capsule, Rfl: 11   levothyroxine (SYNTHROID) 75 MCG tablet, TAKE 1 TABLET EVERY DAY ON EMPTY STOMACHWITH A GLASS OF WATER AT  LEAST 30-60 MINBEFORE BREAKFAST (Patient taking differently: Take 75 mcg by mouth daily before breakfast. On an empty stomach with a glass of water at least 30-60 minutes before breakfast.), Disp: 90 tablet, Rfl: 1   omeprazole (PRILOSEC) 20 MG capsule, TAKE 1 CAPSULE BY MOUTH DAILY (Patient taking differently: Take 20 mg by mouth daily.), Disp: 30 capsule, Rfl: 3   prednisoLONE acetate (PRED FORTE) 1 % ophthalmic suspension, Place 1 drop into the right eye daily., Disp: , Rfl:    promethazine (PHENERGAN) 12.5 MG suppository, Place 1 suppository (12.5 mg total) rectally every 6 (six) hours as needed for nausea or vomiting., Disp: 12 each, Rfl: 0   promethazine (PHENERGAN) 12.5 MG tablet, Take 1 tablet (12.5 mg total) by mouth every 8 (eight) hours as needed for nausea or vomiting., Disp: 30 tablet, Rfl: 0   RESTASIS 0.05 % ophthalmic emulsion, Place 1 drop into both eyes 2 (two) times daily., Disp: , Rfl:    rosuvastatin (CRESTOR) 10 MG tablet, TAKE ONE TABLET EVERY DAY. (Patient taking differently: Take 10 mg by mouth daily.), Disp: 90 tablet, Rfl: 3   traZODone (DESYREL) 50 MG tablet, TAKE ONE TABLET AT BEDTIME AS NEEDED FORSLEEP. (Patient taking differently: Take 50 mg by mouth at bedtime as needed for sleep.), Disp: 90 tablet, Rfl: 1   dexamethasone (DECADRON) 4 MG tablet, Take 1 tab 2 times daily starting day before pemetrexed. Then take 2 tabs daily x 3 days starting day after carboplatin. Take with food., Disp: 30 tablet, Rfl: 1   folic acid (FOLVITE) 1 MG tablet, Take 1 tablet (1 mg total) by mouth daily. Start 7 days before pemetrexed chemotherapy. Continue until 21 days after pemetrexed completed., Disp: 100 tablet, Rfl: 3   lidocaine-prilocaine (EMLA) cream, Apply to affected area once, Disp: 30 g, Rfl: 3   ondansetron (ZOFRAN) 4 MG tablet, Take 1 tablet (4 mg total) by mouth every 6 (six) hours as needed for nausea. (Patient not taking: Reported on 04/03/2022), Disp: 30 tablet, Rfl: 1    ondansetron (ZOFRAN) 8 MG tablet, Take 1 tablet (8 mg total) by mouth every 8 (eight) hours as needed for nausea or vomiting. Start on the third day after carboplatin., Disp: 30 tablet, Rfl: 1   prochlorperazine (COMPAZINE) 10 MG tablet, Take 1 tablet (10 mg total) by mouth every 6 (six) hours as needed for nausea  or vomiting., Disp: 30 tablet, Rfl: 1  Physical exam:  Vitals:   04/03/22 1058  BP: 127/68  Pulse: 86  Resp: 16  SpO2: 96%  Weight: 165 lb 4.8 oz (75 kg)   Physical Exam Constitutional:      General: She is not in acute distress.    Comments: Sitting in a wheelchair  HENT:     Head:     Comments: Palpable calvarial lesions Cardiovascular:     Rate and Rhythm: Normal rate and regular rhythm.     Heart sounds: Normal heart sounds.  Pulmonary:     Effort: Pulmonary effort is normal.     Breath sounds: Normal breath sounds.  Abdominal:     General: Bowel sounds are normal.     Palpations: Abdomen is soft.  Skin:    General: Skin is warm and dry.  Neurological:     Mental Status: She is alert and oriented to person, place, and time.         Latest Ref Rng & Units 03/26/2022    6:29 AM  CMP  Glucose 70 - 99 mg/dL 107   BUN 8 - 23 mg/dL 13   Creatinine 0.44 - 1.00 mg/dL 0.63   Sodium 135 - 145 mmol/L 138   Potassium 3.5 - 5.1 mmol/L 4.0   Chloride 98 - 111 mmol/L 104   CO2 22 - 32 mmol/L 27   Calcium 8.9 - 10.3 mg/dL 9.3       Latest Ref Rng & Units 03/26/2022    6:29 AM  CBC  WBC 4.0 - 10.5 K/uL 14.7   Hemoglobin 12.0 - 15.0 g/dL 14.0   Hematocrit 36.0 - 46.0 % 41.0   Platelets 150 - 400 K/uL 245       IR IMAGING GUIDED PORT INSERTION  Result Date: 03/27/2022 CLINICAL DATA:  Metastatic disease of unknown primary EXAM: RIGHT INTERNAL JUGULAR SINGLE LUMEN POWER PORT CATHETER INSERTION Date:  03/26/2022 03/26/2022 5:32 pm Radiologist:  Jerilynn Mages. Daryll Brod, MD Guidance:  Ultrasound and fluoroscopic MEDICATIONS: 1% lidocaine local with epinephrine  ANESTHESIA/SEDATION: Versed 1.0 mg IV; Fentanyl 50 mcg IV; Moderate Sedation Time:  23 minutes The patient was continuously monitored during the procedure by the interventional radiology nurse under my direct supervision. FLUOROSCOPY: One minutes, 6 seconds (4 mGy) COMPLICATIONS: None immediate. CONTRAST:  None PROCEDURE: Informed consent was obtained from the patient following explanation of the procedure, risks, benefits and alternatives. The patient understands, agrees and consents for the procedure. All questions were addressed. A time out was performed. Maximal barrier sterile technique utilized including caps, mask, sterile gowns, sterile gloves, large sterile drape, hand hygiene, and 2% chlorhexidine scrub. Under sterile conditions and local anesthesia, right internal jugular micropuncture venous access was performed. Access was performed with ultrasound. Images were obtained for documentation of the patent right internal jugular vein. A guide wire was inserted followed by a transitional dilator. This allowed insertion of a guide wire and catheter into the IVC. Measurements were obtained from the SVC / RA junction back to the right IJ venotomy site. In the right infraclavicular chest, a subcutaneous pocket was created over the second anterior rib. This was done under sterile conditions and local anesthesia. 1% lidocaine with epinephrine was utilized for this. A 2.5 cm incision was made in the skin. Blunt dissection was performed to create a subcutaneous pocket over the right pectoralis major muscle. The pocket was flushed with saline vigorously. There was adequate hemostasis. The port catheter was assembled and  checked for leakage. The port catheter was secured in the pocket with two retention sutures. The tubing was tunneled subcutaneously to the right venotomy site and inserted into the SVC/RA junction through a valved peel-away sheath. Position was confirmed with fluoroscopy. Images were obtained for  documentation. The patient tolerated the procedure well. No immediate complications. Incisions were closed in a two layer fashion with 4 - 0 Vicryl suture. Dermabond was applied to the skin. The port catheter was accessed, blood was aspirated followed by saline and heparin flushes. Needle was removed. A dry sterile dressing was applied. IMPRESSION: Ultrasound and fluoroscopically guided right internal jugular single lumen power port catheter insertion. Tip in the SVC/RA junction. Catheter ready for use. Electronically Signed   By: Jerilynn Mages.  Shick M.D.   On: 03/27/2022 08:16   DG Chest 1 View  Result Date: 03/25/2022 CLINICAL DATA:  Post bronchoscopy EXAM: CHEST  1 VIEW COMPARISON:  03/14/2022, CT 03/20/2022 FINDINGS: Elevation of the right diaphragm with atelectasis at the right base. Small right-sided pleural effusion. No definitive pneumothorax. Right hilar fullness likely corresponds to enlarged hilar nodes on CT. Pulmonary nodules on CT are not well demonstrated radiographically. Stable cardiomediastinal silhouette. IMPRESSION: 1. Elevated right diaphragm with small right effusion and right basilar probable atelectasis. 2. Negative for pneumothorax. Electronically Signed   By: Donavan Foil M.D.   On: 03/25/2022 15:18   MR BRAIN W WO CONTRAST  Result Date: 03/22/2022 CLINICAL DATA:  Follow-up occipital skull lesion. EXAM: MRI HEAD WITHOUT AND WITH CONTRAST TECHNIQUE: Multiplanar, multiecho pulse sequences of the brain and surrounding structures were obtained without and with intravenous contrast. CONTRAST:  7.36m GADAVIST GADOBUTROL 1 MMOL/ML IV SOLN COMPARISON:  Brain MRI from 4 days ago. FINDINGS: Brain: No evidence of brain metastasis or acute infarct. Mild occipital vasogenic edema on the left adjacent to extra-axial lesion which deforms the brain at this level. No hydrocephalus or collection. Vascular: See below concerning venous structures. Skull and upper cervical spine: Heterogeneously enhancing  calvarial mass extending through both inner and outer cortex, up to 3.6 cm on sagittal postcontrast images. There is affect on the dural sinuses at the lower superior sagittal sinus and torcula. A small rightward channel of superior sagittal sinus is likely patent into the transverse dural sinus on the right. Left-sided transverse sinus filling is less apparent. The torcula is up lifted and narrowed. Two other calvarial metastasis are seen in the parietal bones, on the right with probable extension into the outer cortex and scalp, 18 mm on 17:126. On diffusion imaging there small indeterminate areas of signal abnormality in the right frontal and lower parietal regions as marked on series 5 Sinuses/Orbits: Bilateral mastoid opacification with negative nasopharynx. Right cataract resection. Other: IMPRESSION: 1. Multifocal calvarial metastatic disease greatest in the midline occipital bone where tumor extends through the inner table and invades the lower superior sagittal sinus and torcula. A small channel of patent superior sagittal sinus is seen extending into the dominant right transverse sinus. 2. Left occipital pole vasogenic edema where contacting #1. 3. An 18 mm high right parietal bone metastasis extends through outer cortex. 4. Negative for parenchymal metastasis. Electronically Signed   By: JJorje GuildM.D.   On: 03/22/2022 11:34   CT CHEST ABDOMEN PELVIS W CONTRAST  Result Date: 03/20/2022 CLINICAL DATA:  Patient presents with multiple calvarial lesions concerning for metastatic disease. EXAM: CT CHEST, ABDOMEN, AND PELVIS WITH CONTRAST TECHNIQUE: Multidetector CT imaging of the chest, abdomen and pelvis was performed following the standard  protocol during bolus administration of intravenous contrast. RADIATION DOSE REDUCTION: This exam was performed according to the departmental dose-optimization program which includes automated exposure control, adjustment of the mA and/or kV according to patient  size and/or use of iterative reconstruction technique. CONTRAST:  41m OMNIPAQUE IOHEXOL 350 MG/ML SOLN COMPARISON:  None Available. FINDINGS: CT CHEST FINDINGS Cardiovascular: Heart size appears normal. No significant pericardial effusion identified. Mediastinum/Nodes: Thyroid gland, trachea and esophagus are unremarkable. Multiple enlarged right mediastinal, multiple enlarged thoracic lymph nodes are identified which are predominantly right-sided. These include: -right internal mammary lymph node measures 1.1 cm, image 27/4. -right paratracheal lymph node measures 2.3 cm, image 24/4. -subcarinal lymph node measures 1.9 cm, image 30/4. -right hilar node measures 1.7 cm, image 27/4. Lungs/Pleura: Small right pleural effusion identified, image 32/4. Subsegmental atelectasis is identified within the basilar right middle lobe and posterior right lower lobe. Bilateral pulmonary nodules are identified, including: -dominant spiculated nodule within the periphery of the right upper lobe measures 0.9 cm , image 59/5. -Nodule within the anteromedial right upper lobe measures 0.4 cm, image 65/5. Perifissural nodule along the minor fissure measures 3 mm, image 64/5. -anteromedial left upper lobe nodule measures 4 mm, image 63/5. Musculoskeletal: Multifocal lucent bone metastases are noted. -Within the left side of the T11 vertebra there is a lucent bone lesion measuring 1.6 cm, image 106/6. -Within the left lateral T7 vertebra there is a lucent bone lesion measuring 1.1 cm, image 79/5. -Lytic lesion with pathologic fracture involving the lateral aspect of the right fourth rib is identified, image 60/5. CT ABDOMEN PELVIS FINDINGS Hepatobiliary: No focal liver abnormality is seen. No gallstones, gallbladder wall thickening, or biliary dilatation. Pancreas: Unremarkable. No pancreatic ductal dilatation or surrounding inflammatory changes. Spleen: Normal in size without focal abnormality. Adrenals/Urinary Tract: Right adrenal mass  measures 3.6 x 2.0 cm, image 52/4. Normal left adrenal gland. No kidney mass or hydronephrosis identified. Urinary bladder is unremarkable. Stomach/Bowel: Stomach appears normal. The appendix is visualized and is within normal limits. No bowel wall thickening, inflammation, or distension. Vascular/Lymphatic: No significant vascular findings are present. No enlarged abdominal or pelvic lymph nodes. Reproductive: Uterus and bilateral adnexa are unremarkable. Other: No ascites or fluid collection. No peritoneal nodularity identified. Musculoskeletal: Lucent bone lesion within the left iliac wing measures 1.3 cm, image 96/4. Lucent lesion within the right iliac bone adjacent to the SI joint measures 1.1 cm, image 91/4. Status post right hip arthroplasty. IMPRESSION: 1. Enlarged right-sided mediastinal, hilar and subcarinal lymph nodes are identified compatible with metastatic adenopathy. 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 2. Multiple small lung nodules are identified bilaterally. The dominant nodule is in the periphery of the right upper lobe. 3. Right adrenal mass, suspicious for metastatic disease. 4. 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 Pathologic fracture involving the lateral aspect of the right fourth rib. 5. Small right pleural effusion with subsegmental atelectasis within the basilar right middle lobe and posterior right lower lobe. Electronically Signed   By: TKerby MoorsM.D.   On: 03/20/2022 10:28   CT Head W or Wo Contrast  Result Date: 03/19/2022 CLINICAL DATA:  Intracranial metastatic disease EXAM: CT HEAD WITHOUT AND WITH CONTRAST TECHNIQUE: Contiguous axial images were obtained from the base of the skull through the vertex without and with intravenous contrast. RADIATION DOSE REDUCTION: This exam was performed according to the departmental dose-optimization program which includes automated  exposure control, adjustment of the mA and/or  kV according to patient size and/or use of iterative reconstruction technique. CONTRAST:  24m OMNIPAQUE IOHEXOL 350 MG/ML SOLN COMPARISON:  Brain MRI 03/18/2022 FINDINGS: Brain: There is a posterior paramedian extra-axial mass near the torcula herophili with edema of the adjacent left occipital lobe. The intracranial portion of the mass measures 1.6 x 0.9 cm. There are no enhancing intraparenchymal lesions. Vascular: Filling defect at the torcula herophili extending partially into the superior sagittal sinus. Skull: Moth-eaten appearance of the occipital bone at the site of the above-described mass. The mass extends to the extracranial space with a dome-shaped lesion measuring 1.2 x 0.6 cm. Sinuses/Orbits: No fluid levels or advanced mucosal thickening of the visualized paranasal sinuses. No mastoid or middle ear effusion. The orbits are normal. IMPRESSION: 1. 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 2. Edema of the left occipital lobe, adjacent to the above-described mass. 3. Filling defect at the torcula herophili extending partially into the superior sagittal sinus, concerning for tumor invasion. Electronically Signed   By: KUlyses JarredM.D.   On: 03/19/2022 23:30   MR Brain Wo Contrast  Result Date: 03/18/2022 CLINICAL DATA:  Acute intractable headache.  Nausea and dizziness. EXAM: MRI HEAD WITHOUT CONTRAST TECHNIQUE: Multiplanar, multiecho pulse sequences of the brain and surrounding structures were obtained without intravenous contrast. COMPARISON:  MRI head 04/06/2015 FINDINGS: Brain: Ventricle size and cerebral volume normal. Negative for acute infarct. Mild edema in the left occipital lobe. There is associated occipital bone lesion with extraosseous mass extending intracranially which appears associated with the left occipital lobe edema. No other edema.  Vascular: Negative for hyperdense vessel Skull and upper cervical spine: Multiple skull lesions. Largest lesion in the occipital bone posteriorly measures 39 x 26 mm. This contains susceptibility likely due to bony fragmentation in the mass. Smaller lesions are seen in the high parietal bone bilaterally. Central skull base intact. Sinuses/Orbits: Paranasal sinuses clear. Bilateral mastoid effusion. Right cataract extraction Other: None IMPRESSION: 1. Negative for acute infarct. 2. Multiple skull lesions. The largest lesion is in the occipital bone, with adjacent intracranial extension of tumor. The smaller lesions are seen in the high parietal bone bilaterally. 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 3. 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 4. CT head   with contrast recommended for further evaluation. Electronically Signed   By: CFranchot GalloM.D.   On: 03/18/2022 17:19   DG Chest 2 View  Result Date: 03/14/2022 CLINICAL DATA:  Chest pain and shortness of breath EXAM: CHEST - 2 VIEW COMPARISON:  None Available. FINDINGS: The heart size and mediastinal contours are within normal limits. Both lungs are clear. The visualized skeletal structures are unremarkable. IMPRESSION: No acute cardiopulmonary abnormality. Electronically Signed   By: MBeryle FlockM.D.   On: 03/14/2022 11:58     Assessment and plan- Patient is a 71y.o. female presenting with mediastinal adenopathy, bilateral lung nodules, adrenal mets, calvarial mets and bone metastases likely compatible with lung primary  I have reviewed CT chest abdomen and pelvis images independently as well as pathology results and discussed all this in great detail with patient and her husband.  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  This could be compatible with lung primary but upper GI or pancreaticobiliary was not excluded based on  pathology.  However on imaging patient does not have any esophageal thickening  or abnormalities in the pancreatico biliary tree.  Pattern of bone and adrenal metastases as well as bilateral lung nodules also seems clinically compatible with stage IV lung cancer.  Discussed with the patient that we are currently awaiting NGS testing to decide if she has any actionable mutations that would allow [place] to use oral targetable agents.  If she does not have actionable mutations and based on her PD-L1 status we will decide if she can get single agent immunotherapy or chemoimmunotherapy combination.  However given the burden of disease and the fact that patient is symptomatic I would like to proceed with cycle 1 of carbo Alimta next week.  Discussed risks and benefits of chemotherapy including all but not limited to nausea, vomiting, low blood counts, risk of infections and hospitalizations.  Treatment will be given with a palliative intent.  She already has a port in place.  After PD-L1 and NGS testing is back if there are no actionable mutations and PD-L1 expression is less than 50% I will plan to give her carbo Alimta and Keytruda IV every 3 weeks for 4 cycles followed by maintenance Alimta and Keytruda until progression or toxicity.  Treatment will be given with a palliative intent.  Patient understands and agrees to proceed as planned.  Also discussed that she has bone metastases she would qualify for bisphosphonates to reduce the risk of future skeletal fractures.  Discussed risks and benefits of Zometa including all but not limited to possible risk of osteonecrosis of the jaw.  We will obtain dental clearance prior.   Visit Diagnosis 1. 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 Goals of care, counseling/discussion      Dr. Randa Evens, MD, MPH [facility] [phone] 04/04/2022 6:04 AM

## 2022-04-04 NOTE — Progress Notes (Signed)
  Radiation Oncology         [phone] ________________________________  Name: SHABREKA COULON  MPN:[id number]  Date of Service: 04/04/22  DOB: April 13, 1951   Steroid Taper Instructions   You currently have a prescription for Dexamethasone 2 mg Tablets.   Beginning today  Continue taking a 2 mg tablet twice a day until 04/11/22  Beginning 04/12/22: Take one 2mg  tablet daily for 2 weeks and then stop.   Please call our office if you have any headaches, visual changes, uncontrolled movements, extremity weakness, nausea or vomiting.    Nicholos Johns, MMS, PA-C [facility] [place]: [phone]  Fax: [phone]

## 2022-04-04 NOTE — Progress Notes (Signed)
Patient in for 15 minute observation SRS Brain 5/5 completed.  Denies headache, visual changes, nausea, and skin irritation.  Reports mild fatigue and had ringing in ears prior to starting treatment not related.  Speech clear.  Patient left via wheelchair with husband pushing chair.  Patient received Decadron taper instructions per Ashlyn Bruning, PA-C.  Vitals:  98.0-91-18-139/83 O2 sat 97% room air.  Advised patient to call [phone] if side effects should worsen or have questions.

## 2022-04-04 NOTE — Progress Notes (Signed)
Met with patient during hospital follow up visit with Dr. Janese Banks. All questions answered during visit. Reviewed upcoming appts with patient. Contact info given and instructed to call with any questions or needs.   Confirmed with [facility] pathology that tissue sample was sent out for Guardant testing on 10/23. Dental clearance letter faxed to [facility] prior to starting zometa or xgeva.

## 2022-04-07 ENCOUNTER — Inpatient Hospital Stay: Payer: PPO

## 2022-04-08 ENCOUNTER — Encounter (HOSPITAL_COMMUNITY): Payer: Self-pay

## 2022-04-08 ENCOUNTER — Other Ambulatory Visit: Payer: Self-pay | Admitting: *Deleted

## 2022-04-08 MED ORDER — OXYCODONE HCL 5 MG PO TABS: 5.0000 mg | ORAL_TABLET | Freq: Four times a day (QID) | ORAL | 0 refills | Status: DC | PRN

## 2022-04-08 MED FILL — Fosaprepitant Dimeglumine For IV Infusion 150 MG (Base Eq): INTRAVENOUS | Qty: 5 | Status: AC

## 2022-04-08 MED FILL — Dexamethasone Sodium Phosphate Inj 100 MG/10ML: INTRAMUSCULAR | Qty: 1 | Status: AC

## 2022-04-08 NOTE — Progress Notes (Signed)
Do we have aother molecular tests pending? I only see PDL1

## 2022-04-09 ENCOUNTER — Inpatient Hospital Stay: Payer: PPO | Attending: Oncology

## 2022-04-09 ENCOUNTER — Inpatient Hospital Stay (HOSPITAL_BASED_OUTPATIENT_CLINIC_OR_DEPARTMENT_OTHER): Payer: PPO | Admitting: Oncology

## 2022-04-09 ENCOUNTER — Encounter: Payer: Self-pay | Admitting: Oncology

## 2022-04-09 ENCOUNTER — Encounter: Payer: Self-pay | Admitting: *Deleted

## 2022-04-09 ENCOUNTER — Inpatient Hospital Stay: Payer: PPO

## 2022-04-09 VITALS — BP 123/82 | HR 97 | Temp 97.8°F | Resp 16 | Wt 164.9 lb

## 2022-04-09 DIAGNOSIS — C7970 Secondary malignant neoplasm of unspecified adrenal gland: Secondary | ICD-10-CM | POA: Diagnosis not present

## 2022-04-09 DIAGNOSIS — Z5111 Encounter for antineoplastic chemotherapy: Secondary | ICD-10-CM | POA: Insufficient documentation

## 2022-04-09 DIAGNOSIS — C3490 Malignant neoplasm of unspecified part of unspecified bronchus or lung: Secondary | ICD-10-CM | POA: Diagnosis not present

## 2022-04-09 DIAGNOSIS — Z5112 Encounter for antineoplastic immunotherapy: Secondary | ICD-10-CM | POA: Insufficient documentation

## 2022-04-09 DIAGNOSIS — C3411 Malignant neoplasm of upper lobe, right bronchus or lung: Secondary | ICD-10-CM | POA: Insufficient documentation

## 2022-04-09 DIAGNOSIS — C7951 Secondary malignant neoplasm of bone: Secondary | ICD-10-CM | POA: Diagnosis not present

## 2022-04-09 DIAGNOSIS — C7802 Secondary malignant neoplasm of left lung: Secondary | ICD-10-CM | POA: Diagnosis not present

## 2022-04-09 DIAGNOSIS — Z79899 Other long term (current) drug therapy: Secondary | ICD-10-CM | POA: Insufficient documentation

## 2022-04-09 LAB — COMPREHENSIVE METABOLIC PANEL
ALT: 18 U/L (ref 0–44)
AST: 18 U/L (ref 15–41)
Albumin: 3.3 g/dL — ABNORMAL LOW (ref 3.5–5.0)
Alkaline Phosphatase: 78 U/L (ref 38–126)
Anion gap: 7 (ref 5–15)
BUN: 30 mg/dL — ABNORMAL HIGH (ref 8–23)
CO2: 25 mmol/L (ref 22–32)
Calcium: 9.3 mg/dL (ref 8.9–10.3)
Chloride: 104 mmol/L (ref 98–111)
Creatinine, Ser: 0.63 mg/dL (ref 0.44–1.00)
GFR, Estimated: >60 mL/min (ref >60)
Glucose, Bld: 89 mg/dL (ref 70–99)
Potassium: 4 mmol/L (ref 3.5–5.1)
Sodium: 136 mmol/L (ref 135–145)
Total Bilirubin: 0.3 mg/dL (ref 0.3–1.2)
Total Protein: 6.6 g/dL (ref 6.5–8.1)

## 2022-04-09 LAB — CBC WITH DIFFERENTIAL/PLATELET
Abs Immature Granulocytes: 0.23 10*3/uL — ABNORMAL HIGH (ref 0.00–0.07)
Basophils Absolute: 0.1 10*3/uL (ref 0.0–0.1)
Basophils Relative: 0 %
Eosinophils Absolute: 0.1 10*3/uL (ref 0.0–0.5)
Eosinophils Relative: 0 %
HCT: 44.6 % (ref 36.0–46.0)
Hemoglobin: 15 g/dL (ref 12.0–15.0)
Immature Granulocytes: 1 %
Lymphocytes Relative: 9 %
Lymphs Abs: 1.7 10*3/uL (ref 0.7–4.0)
MCH: 31.6 pg (ref 26.0–34.0)
MCHC: 33.6 g/dL (ref 30.0–36.0)
MCV: 94.1 fL (ref 80.0–100.0)
Monocytes Absolute: 1.9 10*3/uL — ABNORMAL HIGH (ref 0.1–1.0)
Monocytes Relative: 10 %
Neutro Abs: 14.8 10*3/uL — ABNORMAL HIGH (ref 1.7–7.7)
Neutrophils Relative %: 80 %
Platelets: 313 10*3/uL (ref 150–400)
RBC: 4.74 MIL/uL (ref 3.87–5.11)
RDW: 12.6 % (ref 11.5–15.5)
WBC: 18.8 10*3/uL — ABNORMAL HIGH (ref 4.0–10.5)
nRBC: 0 % (ref 0.0–0.2)

## 2022-04-09 MED ORDER — SODIUM CHLORIDE 0.9 % IV SOLN
150.0000 mg | Freq: Once | INTRAVENOUS | Status: AC
  Administered 2022-04-09: 150 mg via INTRAVENOUS
  Filled 2022-04-09: qty 150

## 2022-04-09 MED ORDER — SODIUM CHLORIDE 0.9 % IV SOLN
500.0000 mg/m2 | Freq: Once | INTRAVENOUS | Status: AC
  Administered 2022-04-09: 900 mg via INTRAVENOUS
  Filled 2022-04-09: qty 20

## 2022-04-09 MED ORDER — SODIUM CHLORIDE 0.9 % IV SOLN
Freq: Once | INTRAVENOUS | Status: AC
  Filled 2022-04-09: qty 250

## 2022-04-09 MED ORDER — PALONOSETRON HCL INJECTION 0.25 MG/5ML
0.2500 mg | Freq: Once | INTRAVENOUS | Status: AC
  Administered 2022-04-09: 0.25 mg via INTRAVENOUS
  Filled 2022-04-09: qty 5

## 2022-04-09 MED ORDER — CYANOCOBALAMIN 1000 MCG/ML IJ SOLN
1000.0000 ug | Freq: Once | INTRAMUSCULAR | Status: AC
  Administered 2022-04-09: 1000 ug via INTRAMUSCULAR
  Filled 2022-04-09: qty 1

## 2022-04-09 MED ORDER — SODIUM CHLORIDE 0.9 % IV SOLN
435.0000 mg | Freq: Once | INTRAVENOUS | Status: AC
  Administered 2022-04-09: 440 mg via INTRAVENOUS
  Filled 2022-04-09: qty 44

## 2022-04-09 MED ORDER — HEPARIN SOD (PORK) LOCK FLUSH 100 UNIT/ML IV SOLN
500.0000 [IU] | Freq: Once | INTRAVENOUS | Status: AC | PRN
  Administered 2022-04-09: 500 [IU]
  Filled 2022-04-09: qty 5

## 2022-04-09 MED ORDER — SODIUM CHLORIDE 0.9 % IV SOLN
10.0000 mg | Freq: Once | INTRAVENOUS | Status: AC
  Administered 2022-04-09: 10 mg via INTRAVENOUS
  Filled 2022-04-09: qty 10

## 2022-04-09 MED ORDER — SODIUM CHLORIDE 0.9% FLUSH
10.0000 mL | INTRAVENOUS | Status: DC | PRN
  Administered 2022-04-09: 10 mL
  Filled 2022-04-09: qty 10

## 2022-04-09 NOTE — Patient Instructions (Addendum)
[facility] AT [place]  Discharge Instructions: Thank you for choosing [facility] to provide your oncology and hematology care.  If you have a lab appointment with the [facility], please go directly to the [facility] and check in at the registration area.  Wear comfortable clothing and clothing appropriate for easy access to any Portacath or PICC line.   We strive to give you quality time with your provider. You may need to reschedule your appointment if you arrive late (15 or more minutes).  Arriving late affects you and other patients whose appointments are after yours.  Also, if you miss three or more appointments without notifying the office, you may be dismissed from the clinic at the provider's discretion.      For prescription refill requests, have your pharmacy contact our office and allow 72 hours for refills to be completed.    Today you received the following chemotherapy and/or immunotherapy agents- Alimta, Carboplatin      To help prevent nausea and vomiting after your treatment, we encourage you to take your nausea medication as directed.  BELOW ARE SYMPTOMS THAT SHOULD BE REPORTED IMMEDIATELY: *FEVER GREATER THAN 100.4 F (38 C) OR HIGHER *CHILLS OR SWEATING *NAUSEA AND VOMITING THAT IS NOT CONTROLLED WITH YOUR NAUSEA MEDICATION *UNUSUAL SHORTNESS OF BREATH *UNUSUAL BRUISING OR BLEEDING *URINARY PROBLEMS (pain or burning when urinating, or frequent urination) *BOWEL PROBLEMS (unusual diarrhea, constipation, pain near the anus) TENDERNESS IN MOUTH AND THROAT WITH OR WITHOUT PRESENCE OF ULCERS (sore throat, sores in mouth, or a toothache) UNUSUAL RASH, SWELLING OR PAIN  UNUSUAL VAGINAL DISCHARGE OR ITCHING   Items with * indicate a potential emergency and should be followed up as soon as possible or go to the Emergency Department if any problems should occur.  Please show the CHEMOTHERAPY ALERT CARD or IMMUNOTHERAPY ALERT CARD at  check-in to the Emergency Department and triage nurse.  Should you have questions after your visit or need to cancel or reschedule your appointment, please contact [facility] CANCER [place] AT [place]  [phone] and follow the prompts.  Office hours are 8:00 a.m. to 4:30 p.m. Monday - Friday. Please note that voicemails left after 4:00 p.m. may not be returned until the following business day.  We are closed weekends and major holidays. You have access to a nurse at all times for urgent questions. Please call the main number to the clinic [phone] and follow the prompts.  For any non-urgent questions, you may also contact your provider using MyChart. We now offer e-Visits for anyone 28 and older to request care online for non-urgent symptoms. For details visit [email or web address].   Also download the MyChart app! Go to the app store, search "MyChart", open the app, select [facility], and log in with your MyChart username and password.  Masks are optional in the cancer centers. If you would like for your care team to wear a mask while they are taking care of you, please let them know. For doctor visits, patients may have with them one support person who is at least 71 years old. At this time, visitors are not allowed in the infusion area.  Pemetrexed Injection What is this medication? PEMETREXED (PEM e TREX ed) treats some types of cancer. It works by slowing down the growth of cancer cells. This medicine may be used for other purposes; ask your health care provider or pharmacist if you have questions. COMMON BRAND NAME(S): Alimta, PEMFEXY What should I tell my  care team before I take this medication? They need to know if you have any of these conditions: Infection, such as chickenpox, cold sores, or herpes Kidney disease Low blood cell levels (white cells, red cells, and platelets) Lung or breathing disease, such as asthma Radiation therapy An unusual or allergic reaction to  pemetrexed, other medications, foods, dyes, or preservatives If you or your partner are pregnant or trying to get pregnant Breast-feeding How should I use this medication? This medication is injected into a vein. It is given by your care team in a hospital or clinic setting. Talk to your care team about the use of this medication in children. Special care may be needed. Overdosage: If you think you have taken too much of this medicine contact a poison control center or emergency room at once. NOTE: This medicine is only for you. Do not share this medicine with others. What if I miss a dose? Keep appointments for follow-up doses. It is important not to miss your dose. Call your care team if you are unable to keep an appointment. What may interact with this medication? Do not take this medication with any of the following: Live virus vaccines This medication may also interact with the following: Ibuprofen This list may not describe all possible interactions. Give your health care provider a list of all the medicines, herbs, non-prescription drugs, or dietary supplements you use. Also tell them if you smoke, drink alcohol, or use illegal drugs. Some items may interact with your medicine. What should I watch for while using this medication? Your condition will be monitored carefully while you are receiving this medication. This medication may make you feel generally unwell. This is not uncommon as chemotherapy can affect healthy cells as well as cancer cells. Report any side effects. Continue your course of treatment even though you feel ill unless your care team tells you to stop. This medication can cause serious side effects. To reduce the risk, your care team may give you other medications to take before receiving this one. Be sure to follow the directions from your care team. This medication can cause a rash or redness in areas of the body that have previously had radiation therapy. If you have  had radiation therapy, tell your care team if you notice a rash in this area. This medication may increase your risk of getting an infection. Call your care team for advice if you get a fever, chills, sore throat, or other symptoms of a cold or flu. Do not treat yourself. Try to avoid being around people who are sick. Be careful brushing or flossing your teeth or using a toothpick because you may get an infection or bleed more easily. If you have any dental work done, tell your dentist you are receiving this medication. Avoid taking medications that contain aspirin, acetaminophen, ibuprofen, naproxen, or ketoprofen unless instructed by your care team. These medications may hide a fever. Check with your care team if you have severe diarrhea, nausea, and vomiting, or if you sweat a lot. The loss of too much body fluid may make it dangerous for you to take this medication. Talk to your care team if you or your partner wish to become pregnant or think either of you might be pregnant. This medication can cause serious birth defects if taken during pregnancy and for 6 months after the last dose. A negative pregnancy test is required before starting this medication. A reliable form of contraception is recommended while taking this medication  and for 6 months after the last dose. Talk to your care team about reliable forms of contraception. Do not father a child while taking this medication and for 3 months after the last dose. Use a condom while having sex during this time period. Do not breastfeed while taking this medication and for 1 week after the last dose. This medication may cause infertility. Talk to your care team if you are concerned about your fertility. What side effects may I notice from receiving this medication? Side effects that you should report to your care team as soon as possible: Allergic reactions--skin rash, itching, hives, swelling of the face, lips, tongue, or throat Dry cough, shortness  of breath or trouble breathing Infection--fever, chills, cough, sore throat, wounds that don't heal, pain or trouble when passing urine, general feeling of discomfort or being unwell Kidney injury--decrease in the amount of urine, swelling of the ankles, hands, or feet Low red blood cell level--unusual weakness or fatigue, dizziness, headache, trouble breathing Redness, blistering, peeling, or loosening of the skin, including inside the mouth Unusual bruising or bleeding Side effects that usually do not require medical attention (report to your care team if they continue or are bothersome): Fatigue Loss of appetite Nausea Vomiting This list may not describe all possible side effects. Call your doctor for medical advice about side effects. You may report side effects to FDA at 1-800-FDA-1088. Where should I keep my medication? This medication is given in a hospital or clinic. It will not be stored at home. NOTE: This sheet is a summary. It may not cover all possible information. If you have questions about this medicine, talk to your doctor, pharmacist, or health care provider.  2023 Elsevier/Gold Standard (2021-09-30 00:00:00) Carboplatin Injection What is this medication? CARBOPLATIN (KAR boe pla tin) treats some types of cancer. It works by slowing down the growth of cancer cells. This medicine may be used for other purposes; ask your health care provider or pharmacist if you have questions. COMMON BRAND NAME(S): Paraplatin What should I tell my care team before I take this medication? They need to know if you have any of these conditions: Blood disorders Hearing problems Kidney disease Recent or ongoing radiation therapy An unusual or allergic reaction to carboplatin, cisplatin, other medications, foods, dyes, or preservatives Pregnant or trying to get pregnant Breast-feeding How should I use this medication? This medication is injected into a vein. It is given by your care team in  a hospital or clinic setting. Talk to your care team about the use of this medication in children. Special care may be needed. Overdosage: If you think you have taken too much of this medicine contact a poison control center or emergency room at once. NOTE: This medicine is only for you. Do not share this medicine with others. What if I miss a dose? Keep appointments for follow-up doses. It is important not to miss your dose. Call your care team if you are unable to keep an appointment. What may interact with this medication? Medications for seizures Some antibiotics, such as amikacin, gentamicin, neomycin, streptomycin, tobramycin Vaccines This list may not describe all possible interactions. Give your health care provider a list of all the medicines, herbs, non-prescription drugs, or dietary supplements you use. Also tell them if you smoke, drink alcohol, or use illegal drugs. Some items may interact with your medicine. What should I watch for while using this medication? Your condition will be monitored carefully while you are receiving this medication. You  may need blood work while taking this medication. This medication may make you feel generally unwell. This is not uncommon, as chemotherapy can affect healthy cells as well as cancer cells. Report any side effects. Continue your course of treatment even though you feel ill unless your care team tells you to stop. In some cases, you may be given additional medications to help with side effects. Follow all directions for their use. This medication may increase your risk of getting an infection. Call your care team for advice if you get a fever, chills, sore throat, or other symptoms of a cold or flu. Do not treat yourself. Try to avoid being around people who are sick. Avoid taking medications that contain aspirin, acetaminophen, ibuprofen, naproxen, or ketoprofen unless instructed by your care team. These medications may hide a fever. Be  careful brushing or flossing your teeth or using a toothpick because you may get an infection or bleed more easily. If you have any dental work done, tell your dentist you are receiving this medication. Talk to your care team if you wish to become pregnant or think you might be pregnant. This medication can cause serious birth defects. Talk to your care team about effective forms of contraception. Do not breast-feed while taking this medication. What side effects may I notice from receiving this medication? Side effects that you should report to your care team as soon as possible: Allergic reactions--skin rash, itching, hives, swelling of the face, lips, tongue, or throat Infection--fever, chills, cough, sore throat, wounds that don't heal, pain or trouble when passing urine, general feeling of discomfort or being unwell Low red blood cell level--unusual weakness or fatigue, dizziness, headache, trouble breathing Pain, tingling, or numbness in the hands or feet, muscle weakness, change in vision, confusion or trouble speaking, loss of balance or coordination, trouble walking, seizures Unusual bruising or bleeding Side effects that usually do not require medical attention (report to your care team if they continue or are bothersome): Hair loss Nausea Unusual weakness or fatigue Vomiting This list may not describe all possible side effects. Call your doctor for medical advice about side effects. You may report side effects to FDA at 1-800-FDA-1088. Where should I keep my medication? This medication is given in a hospital or clinic. It will not be stored at home. NOTE: This sheet is a summary. It may not cover all possible information. If you have questions about this medicine, talk to your doctor, pharmacist, or health care provider.  2023 Elsevier/Gold Standard (2021-09-09 00:00:00)

## 2022-04-09 NOTE — Progress Notes (Signed)
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  MRI brain without contrast showed multiple skull lesions with the largest part in the occipital bone with adjacent intracranial extension of the tumor.  Smaller lesions in the high parietal bone bilaterally.  These most likely represent metastatic disease.  Extraosseous extension of the tumor in the left occipital lobe with edema.  CT chest abdomen and pelvis with contrast showed multiple enlarged mediastinal and thoracic lymph nodes in the right.  Bilateral lung nodules.  Lucent bone lesions in T7 and T11.  Right adrenal mass measuring 3.6 cm.  Bone lesions involving the left iliac and right iliac.  Patient underwent bronchoscopy and a biopsy of the hilar and mediastinal lymph nodes which was consistent with mucinous adenocarcinoma that was positive for CK7.  Negative for TTF-1 and Napsin A.  Negative for CK20 and CDX2.  This is compatible with lung primary however differential diagnosis includes GI or pancreaticobiliary origin.   Interval history-patient has completed palliative radiation to her skull.  Pain is relatively well controlled with as needed oxycodone.  ECOG PS- 2 Pain scale- 3 Opioid associated constipation- mo  Review of systems- Review of Systems  Constitutional:  Positive for  malaise/fatigue. Negative for chills, fever and weight loss.  HENT:  Negative for congestion, ear discharge and nosebleeds.   Eyes:  Negative for blurred vision.  Respiratory:  Negative for cough, hemoptysis, sputum production, shortness of breath and wheezing.   Cardiovascular:  Negative for chest pain, palpitations, orthopnea and claudication.  Gastrointestinal:  Negative for abdominal pain, blood in stool, constipation, diarrhea, heartburn, melena, nausea and vomiting.  Genitourinary:  Negative for dysuria, flank pain, frequency, hematuria and urgency.  Musculoskeletal:  Negative for back pain, joint pain and myalgias.  Skin:  Negative for rash.  Neurological:  Positive for headaches. Negative for dizziness, tingling, focal weakness, seizures and weakness.  Endo/Heme/Allergies:  Does not bruise/bleed easily.  Psychiatric/Behavioral:  Negative for depression and suicidal ideas. The patient does not have insomnia.       Allergies  Allergen Reactions   Latex Rash   Levofloxacin     FLUOROQUINOLONES - unknown reaction     Past Medical History:  Diagnosis Date   Anxiety    Arthritis    Depression 01/31/2016   Goiter    Hyperlipidemia    TGs   Hypothyroidism    Seasonal allergies      Past Surgical History:  Procedure Laterality Date   ABDOMINAL HYSTERECTOMY     supracervical    BILATERAL OOPHORECTOMY  2001   BENIGN TUMOR   BREAST BIOPSY Left 1985   cyst    BRONCHIAL NEEDLE ASPIRATION BIOPSY  03/25/2022   Procedure: BRONCHIAL NEEDLE ASPIRATION BIOPSIES;  Surgeon: Lanier Clam, MD;  Location: [facility] ENDOSCOPY;  Service: Endoscopy;;   CATARACT EXTRACTION Right    COLONOSCOPY  08/17/2009   COLONOSCOPY WITH PROPOFOL N/A 03/19/2016   Procedure:  COLONOSCOPY WITH PROPOFOL;  Surgeon: Robert Bellow, MD;  Location: [facility] ENDOSCOPY;  Service: Endoscopy;  Laterality: N/A;   COLONOSCOPY WITH PROPOFOL N/A 05/15/2021   Procedure: COLONOSCOPY WITH PROPOFOL;  Surgeon: Robert Bellow, MD;  Location: [facility] ENDOSCOPY;  Service: Endoscopy;  Laterality: N/A;   CORNEAL TRANSPLANT Right    ENDOBRONCHIAL ULTRASOUND N/A 03/25/2022   Procedure: ENDOBRONCHIAL ULTRASOUND;  Surgeon: Lanier Clam, MD;  Location: [facility] ENDOSCOPY;  Service: Endoscopy;  Laterality: N/A;   EYE SURGERY     IR IMAGING GUIDED PORT INSERTION  03/26/2022   JOINT REPLACEMENT     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   Procedure: TOTAL HIP ARTHROPLASTY ANTERIOR APPROACH;  Surgeon: Hessie Knows, MD;  Location: [facility] ORS;  Service: Orthopedics;  Laterality: Right;   VIDEO BRONCHOSCOPY  03/25/2022   Procedure: VIDEO BRONCHOSCOPY WITHOUT FLUORO;  Surgeon: Lanier Clam, MD;  Location: [facility] ENDOSCOPY;  Service: Endoscopy;;    Social History   Socioeconomic History   Marital status: Married    Spouse name: Not on file   Number of children: Not on file   Years of education: Not on file   Highest education level: Not on file  Occupational History   Occupation: retired  Tobacco Use   Smoking status: Former    Packs/day: 1.00    Years: 24.00    Total pack years: 24.00    Types: Cigarettes    Quit date: 05/09/1998    Years since quitting: 23.9   Smokeless tobacco: Never  Vaping Use   Vaping Use: Never used  Substance and Sexual Activity   Alcohol use: Not Currently    Comment: rarely    Drug use: No   Sexual activity: Not on file  Other Topics Concern   Not on file  Social History Narrative   Not on file   Social Determinants of Health   Financial Resource Strain: Low Risk  (03/29/2021)   Overall Financial Resource Strain (CARDIA)    Difficulty of Paying Living Expenses: Not hard at all  Food Insecurity: No Food Insecurity (03/20/2022)   Hunger Vital Sign    Worried About Running Out of Food in the Last Year: Never true    [place] in the Last Year: Never true  Transportation Needs: No Transportation Needs (03/20/2022)   PRAPARE - Radiographer, therapeutic (Medical): No    Lack of Transportation (Non-Medical): No  Physical Activity: Insufficiently Active (03/29/2021)   Exercise Vital Sign    Days of Exercise per Week: 3 days    Minutes of Exercise per Session: 30 min  Stress: No Stress Concern Present (03/29/2021)   [place]    Feeling of Stress : Not at all  Social Connections: Unknown (03/29/2021)   Social Connection and Isolation Panel [NHANES]    Frequency of Communication with Friends and Family: Not on file    Frequency of Social Gatherings with Friends and Family: Not on file    Attends Religious Services: Not on file    Active Member of Clubs or Organizations: Not on file    Attends Archivist Meetings: Not on file    Marital Status: Married  Intimate Partner Violence: Not At Risk (03/20/2022)   Humiliation, Afraid, Rape, and Kick questionnaire    Fear of Current or Ex-Partner: No    Emotionally Abused: No    Physically Abused: No    Sexually Abused: No  Family History  Problem Relation Age of Onset   Cancer Mother        OVARIAN   Stroke Father    Cancer Sister        breast   Breast cancer Sister 11   Atrial fibrillation Brother    Thyroid disease Other        MULTIPLE FAMILY MEMBERS     Current Outpatient Medications:    acetaminophen (TYLENOL) 500 MG tablet, Take 1,000 mg by mouth every 6 (six) hours as needed for moderate pain., Disp: , Rfl:    ALPRAZolam (XANAX) 0.5 MG tablet, Take 1 tablet (0.5 mg total) by mouth 2 (two) times daily as needed for anxiety., Disp: 60 tablet, Rfl: 2   dexamethasone (DECADRON) 2 MG tablet, Take 1 tablet (2 mg total) by mouth daily. (Patient taking differently: Take 2 mg by mouth 2 (two) times daily.), Disp: 30 tablet, Rfl: 1   dexamethasone (DECADRON) 4 MG tablet, Take 1 tab 2 times daily starting day before pemetrexed. Then take 2 tabs daily x 3 days starting day after carboplatin. Take  with food., Disp: 30 tablet, Rfl: 1   fenofibrate micronized (LOFIBRA) 134 MG capsule, Take 1 capsule (134 mg total) by mouth daily before breakfast., Disp: 30 capsule, Rfl: 11   folic acid (FOLVITE) 1 MG tablet, Take 1 tablet (1 mg total) by mouth daily. Start 7 days before pemetrexed chemotherapy. Continue until 21 days after pemetrexed completed., Disp: 100 tablet, Rfl: 3   levothyroxine (SYNTHROID) 75 MCG tablet, TAKE 1 TABLET EVERY DAY ON EMPTY STOMACHWITH A GLASS OF WATER AT LEAST 30-60 MINBEFORE BREAKFAST (Patient taking differently: Take 75 mcg by mouth daily before breakfast. On an empty stomach with a glass of water at least 30-60 minutes before breakfast.), Disp: 90 tablet, Rfl: 1   lidocaine-prilocaine (EMLA) cream, Apply to affected area once, Disp: 30 g, Rfl: 3   omeprazole (PRILOSEC) 20 MG capsule, TAKE 1 CAPSULE BY MOUTH DAILY (Patient taking differently: Take 20 mg by mouth daily.), Disp: 30 capsule, Rfl: 3   prednisoLONE acetate (PRED FORTE) 1 % ophthalmic suspension, Place 1 drop into the right eye daily., Disp: , Rfl:    RESTASIS 0.05 % ophthalmic emulsion, Place 1 drop into both eyes 2 (two) times daily., Disp: , Rfl:    rosuvastatin (CRESTOR) 10 MG tablet, TAKE ONE TABLET EVERY DAY. (Patient taking differently: Take 10 mg by mouth daily.), Disp: 90 tablet, Rfl: 3   ondansetron (ZOFRAN) 4 MG tablet, Take 1 tablet (4 mg total) by mouth every 6 (six) hours as needed for nausea. (Patient not taking: Reported on 04/03/2022), Disp: 30 tablet, Rfl: 1   ondansetron (ZOFRAN) 8 MG tablet, Take 1 tablet (8 mg total) by mouth every 8 (eight) hours as needed for nausea or vomiting. Start on the third day after carboplatin. (Patient not taking: Reported on 04/09/2022), Disp: 30 tablet, Rfl: 1   oxyCODONE (OXY IR/ROXICODONE) 5 MG immediate release tablet, Take 1 tablet (5 mg total) by mouth every 6 (six) hours as needed (pain). (Patient not taking: Reported on 04/09/2022), Disp: 60 tablet, Rfl: 0    prochlorperazine (COMPAZINE) 10 MG tablet, Take 1 tablet (10 mg total) by mouth every 6 (six) hours as needed for nausea or vomiting. (Patient not taking: Reported on 04/09/2022), Disp: 30 tablet, Rfl: 1   promethazine (PHENERGAN) 12.5 MG suppository, Place 1 suppository (12.5 mg total) rectally every 6 (six) hours as needed for nausea or vomiting. (Patient not taking: Reported on  04/09/2022), Disp: 12 each, Rfl: 0   promethazine (PHENERGAN) 12.5 MG tablet, Take 1 tablet (12.5 mg total) by mouth every 8 (eight) hours as needed for nausea or vomiting. (Patient not taking: Reported on 04/09/2022), Disp: 30 tablet, Rfl: 0   traZODone (DESYREL) 50 MG tablet, TAKE ONE TABLET AT BEDTIME AS NEEDED FORSLEEP. (Patient not taking: Reported on 04/09/2022), Disp: 90 tablet, Rfl: 1 No current facility-administered medications for this visit.  Facility-Administered Medications Ordered in Other Visits:    sodium chloride flush (NS) 0.9 % injection 10 mL, 10 mL, Intracatheter, PRN, Sindy Guadeloupe, MD, 10 mL at 04/09/22 1231  Physical exam:  Vitals:   04/09/22 0917  BP: 123/82  Pulse: 97  Resp: 16  Temp: 97.8 F (36.6 C)  TempSrc: Tympanic  SpO2: 97%  Weight: 164 lb 14.4 oz (74.8 kg)   Physical Exam      Latest Ref Rng & Units 04/09/2022    8:51 AM  CMP  Glucose 70 - 99 mg/dL 89   BUN 8 - 23 mg/dL 30   Creatinine 0.44 - 1.00 mg/dL 0.63   Sodium 135 - 145 mmol/L 136   Potassium 3.5 - 5.1 mmol/L 4.0   Chloride 98 - 111 mmol/L 104   CO2 22 - 32 mmol/L 25   Calcium 8.9 - 10.3 mg/dL 9.3   Total Protein 6.5 - 8.1 g/dL 6.6   Total Bilirubin 0.3 - 1.2 mg/dL 0.3   Alkaline Phos 38 - 126 U/L 78   AST 15 - 41 U/L 18   ALT 0 - 44 U/L 18       Latest Ref Rng & Units 04/09/2022    8:51 AM  CBC  WBC 4.0 - 10.5 K/uL 18.8   Hemoglobin 12.0 - 15.0 g/dL 15.0   Hematocrit 36.0 - 46.0 % 44.6   Platelets 150 - 400 K/uL 313     No images are attached to the encounter.  IR IMAGING GUIDED PORT  INSERTION  Result Date: 03/27/2022 CLINICAL DATA:  Metastatic disease of unknown primary EXAM: RIGHT INTERNAL JUGULAR SINGLE LUMEN POWER PORT CATHETER INSERTION Date:  03/26/2022 03/26/2022 5:32 pm Radiologist:  Jerilynn Mages. Daryll Brod, MD Guidance:  Ultrasound and fluoroscopic MEDICATIONS: 1% lidocaine local with epinephrine ANESTHESIA/SEDATION: Versed 1.0 mg IV; Fentanyl 50 mcg IV; Moderate Sedation Time:  23 minutes The patient was continuously monitored during the procedure by the interventional radiology nurse under my direct supervision. FLUOROSCOPY: One minutes, 6 seconds (4 mGy) COMPLICATIONS: None immediate. CONTRAST:  None PROCEDURE: Informed consent was obtained from the patient following explanation of the procedure, risks, benefits and alternatives. The patient understands, agrees and consents for the procedure. All questions were addressed. A time out was performed. Maximal barrier sterile technique utilized including caps, mask, sterile gowns, sterile gloves, large sterile drape, hand hygiene, and 2% chlorhexidine scrub. Under sterile conditions and local anesthesia, right internal jugular micropuncture venous access was performed. Access was performed with ultrasound. Images were obtained for documentation of the patent right internal jugular vein. A guide wire was inserted followed by a transitional dilator. This allowed insertion of a guide wire and catheter into the IVC. Measurements were obtained from the SVC / RA junction back to the right IJ venotomy site. In the right infraclavicular chest, a subcutaneous pocket was created over the second anterior rib. This was done under sterile conditions and local anesthesia. 1% lidocaine with epinephrine was utilized for this. A 2.5 cm incision was made in the skin. Blunt dissection was  performed to create a subcutaneous pocket over the right pectoralis major muscle. The pocket was flushed with saline vigorously. There was adequate hemostasis. The port  catheter was assembled and checked for leakage. The port catheter was secured in the pocket with two retention sutures. The tubing was tunneled subcutaneously to the right venotomy site and inserted into the SVC/RA junction through a valved peel-away sheath. Position was confirmed with fluoroscopy. Images were obtained for documentation. The patient tolerated the procedure well. No immediate complications. Incisions were closed in a two layer fashion with 4 - 0 Vicryl suture. Dermabond was applied to the skin. The port catheter was accessed, blood was aspirated followed by saline and heparin flushes. Needle was removed. A dry sterile dressing was applied. IMPRESSION: Ultrasound and fluoroscopically guided right internal jugular single lumen power port catheter insertion. Tip in the SVC/RA junction. Catheter ready for use. Electronically Signed   By: Jerilynn Mages.  Shick M.D.   On: 03/27/2022 08:16   DG Chest 1 View  Result Date: 03/25/2022 CLINICAL DATA:  Post bronchoscopy EXAM: CHEST  1 VIEW COMPARISON:  03/14/2022, CT 03/20/2022 FINDINGS: Elevation of the right diaphragm with atelectasis at the right base. Small right-sided pleural effusion. No definitive pneumothorax. Right hilar fullness likely corresponds to enlarged hilar nodes on CT. Pulmonary nodules on CT are not well demonstrated radiographically. Stable cardiomediastinal silhouette. IMPRESSION: 1. Elevated right diaphragm with small right effusion and right basilar probable atelectasis. 2. Negative for pneumothorax. Electronically Signed   By: Donavan Foil M.D.   On: 03/25/2022 15:18   MR BRAIN W WO CONTRAST  Result Date: 03/22/2022 CLINICAL DATA:  Follow-up occipital skull lesion. EXAM: MRI HEAD WITHOUT AND WITH CONTRAST TECHNIQUE: Multiplanar, multiecho pulse sequences of the brain and surrounding structures were obtained without and with intravenous contrast. CONTRAST:  7.47m GADAVIST GADOBUTROL 1 MMOL/ML IV SOLN COMPARISON:  Brain MRI from 4 days ago.  FINDINGS: Brain: No evidence of brain metastasis or acute infarct. Mild occipital vasogenic edema on the left adjacent to extra-axial lesion which deforms the brain at this level. No hydrocephalus or collection. Vascular: See below concerning venous structures. Skull and upper cervical spine: Heterogeneously enhancing calvarial mass extending through both inner and outer cortex, up to 3.6 cm on sagittal postcontrast images. There is affect on the dural sinuses at the lower superior sagittal sinus and torcula. A small rightward channel of superior sagittal sinus is likely patent into the transverse dural sinus on the right. Left-sided transverse sinus filling is less apparent. The torcula is up lifted and narrowed. Two other calvarial metastasis are seen in the parietal bones, on the right with probable extension into the outer cortex and scalp, 18 mm on 17:126. On diffusion imaging there small indeterminate areas of signal abnormality in the right frontal and lower parietal regions as marked on series 5 Sinuses/Orbits: Bilateral mastoid opacification with negative nasopharynx. Right cataract resection. Other: IMPRESSION: 1. Multifocal calvarial metastatic disease greatest in the midline occipital bone where tumor extends through the inner table and invades the lower superior sagittal sinus and torcula. A small channel of patent superior sagittal sinus is seen extending into the dominant right transverse sinus. 2. Left occipital pole vasogenic edema where contacting #1. 3. An 18 mm high right parietal bone metastasis extends through outer cortex. 4. Negative for parenchymal metastasis. Electronically Signed   By: JJorje GuildM.D.   On: 03/22/2022 11:34   CT CHEST ABDOMEN PELVIS W CONTRAST  Result Date: 03/20/2022 CLINICAL DATA:  Patient presents with multiple  calvarial lesions concerning for metastatic disease. EXAM: CT CHEST, ABDOMEN, AND PELVIS WITH CONTRAST TECHNIQUE: Multidetector CT imaging of the  chest, abdomen and pelvis was performed following the standard protocol during bolus administration of intravenous contrast. RADIATION DOSE REDUCTION: This exam was performed according to the departmental dose-optimization program which includes automated exposure control, adjustment of the mA and/or kV according to patient size and/or use of iterative reconstruction technique. CONTRAST:  14m OMNIPAQUE IOHEXOL 350 MG/ML SOLN COMPARISON:  None Available. FINDINGS: CT CHEST FINDINGS Cardiovascular: Heart size appears normal. No significant pericardial effusion identified. Mediastinum/Nodes: Thyroid gland, trachea and esophagus are unremarkable. Multiple enlarged right mediastinal, multiple enlarged thoracic lymph nodes are identified which are predominantly right-sided. These include: -right internal mammary lymph node measures 1.1 cm, image 27/4. -right paratracheal lymph node measures 2.3 cm, image 24/4. -subcarinal lymph node measures 1.9 cm, image 30/4. -right hilar node measures 1.7 cm, image 27/4. Lungs/Pleura: Small right pleural effusion identified, image 32/4. Subsegmental atelectasis is identified within the basilar right middle lobe and posterior right lower lobe. Bilateral pulmonary nodules are identified, including: -dominant spiculated nodule within the periphery of the right upper lobe measures 0.9 cm , image 59/5. -Nodule within the anteromedial right upper lobe measures 0.4 cm, image 65/5. Perifissural nodule along the minor fissure measures 3 mm, image 64/5. -anteromedial left upper lobe nodule measures 4 mm, image 63/5. Musculoskeletal: Multifocal lucent bone metastases are noted. -Within the left side of the T11 vertebra there is a lucent bone lesion measuring 1.6 cm, image 106/6. -Within the left lateral T7 vertebra there is a lucent bone lesion measuring 1.1 cm, image 79/5. -Lytic lesion with pathologic fracture involving the lateral aspect of the right fourth rib is identified, image 60/5. CT  ABDOMEN PELVIS FINDINGS Hepatobiliary: No focal liver abnormality is seen. No gallstones, gallbladder wall thickening, or biliary dilatation. Pancreas: Unremarkable. No pancreatic ductal dilatation or surrounding inflammatory changes. Spleen: Normal in size without focal abnormality. Adrenals/Urinary Tract: Right adrenal mass measures 3.6 x 2.0 cm, image 52/4. Normal left adrenal gland. No kidney mass or hydronephrosis identified. Urinary bladder is unremarkable. Stomach/Bowel: Stomach appears normal. The appendix is visualized and is within normal limits. No bowel wall thickening, inflammation, or distension. Vascular/Lymphatic: No significant vascular findings are present. No enlarged abdominal or pelvic lymph nodes. Reproductive: Uterus and bilateral adnexa are unremarkable. Other: No ascites or fluid collection. No peritoneal nodularity identified. Musculoskeletal: Lucent bone lesion within the left iliac wing measures 1.3 cm, image 96/4. Lucent lesion within the right iliac bone adjacent to the SI joint measures 1.1 cm, image 91/4. Status post right hip arthroplasty. IMPRESSION: 1. Enlarged right-sided mediastinal, hilar and subcarinal lymph nodes are identified compatible with metastatic adenopathy. 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 2. Multiple small lung nodules are identified bilaterally. The dominant nodule is in the periphery of the right upper lobe. 3. Right adrenal mass, suspicious for metastatic disease. 4. 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 Pathologic fracture involving the lateral aspect of the right fourth rib. 5. Small right pleural effusion with subsegmental atelectasis within the basilar right middle lobe and posterior right lower lobe. Electronically Signed   By: TKerby MoorsM.D.   On: 03/20/2022 10:28   CT Head W or Wo Contrast  Result Date: 03/19/2022 CLINICAL DATA:   Intracranial metastatic disease EXAM: CT HEAD WITHOUT AND WITH CONTRAST TECHNIQUE: Contiguous axial images were obtained from the base of the skull through the  vertex without and with intravenous contrast. RADIATION DOSE REDUCTION: This exam was performed according to the departmental dose-optimization program which includes automated exposure control, adjustment of the mA and/or kV according to patient size and/or use of iterative reconstruction technique. CONTRAST:  58m OMNIPAQUE IOHEXOL 350 MG/ML SOLN COMPARISON:  Brain MRI 03/18/2022 FINDINGS: Brain: There is a posterior paramedian extra-axial mass near the torcula herophili with edema of the adjacent left occipital lobe. The intracranial portion of the mass measures 1.6 x 0.9 cm. There are no enhancing intraparenchymal lesions. Vascular: Filling defect at the torcula herophili extending partially into the superior sagittal sinus. Skull: Moth-eaten appearance of the occipital bone at the site of the above-described mass. The mass extends to the extracranial space with a dome-shaped lesion measuring 1.2 x 0.6 cm. Sinuses/Orbits: No fluid levels or advanced mucosal thickening of the visualized paranasal sinuses. No mastoid or middle ear effusion. The orbits are normal. IMPRESSION: 1. 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 2. Edema of the left occipital lobe, adjacent to the above-described mass. 3. Filling defect at the torcula herophili extending partially into the superior sagittal sinus, concerning for tumor invasion. Electronically Signed   By: KUlyses JarredM.D.   On: 03/19/2022 23:30   MR Brain Wo Contrast  Result Date: 03/18/2022 CLINICAL DATA:  Acute intractable headache.  Nausea and dizziness. EXAM: MRI HEAD WITHOUT CONTRAST TECHNIQUE: Multiplanar, multiecho pulse sequences of the brain and surrounding structures were obtained without intravenous  contrast. COMPARISON:  MRI head 04/06/2015 FINDINGS: Brain: Ventricle size and cerebral volume normal. Negative for acute infarct. Mild edema in the left occipital lobe. There is associated occipital bone lesion with extraosseous mass extending intracranially which appears associated with the left occipital lobe edema. No other edema. Vascular: Negative for hyperdense vessel Skull and upper cervical spine: Multiple skull lesions. Largest lesion in the occipital bone posteriorly measures 39 x 26 mm. This contains susceptibility likely due to bony fragmentation in the mass. Smaller lesions are seen in the high parietal bone bilaterally. Central skull base intact. Sinuses/Orbits: Paranasal sinuses clear. Bilateral mastoid effusion. Right cataract extraction Other: None IMPRESSION: 1. Negative for acute infarct. 2. Multiple skull lesions. The largest lesion is in the occipital bone, with adjacent intracranial extension of tumor. The smaller lesions are seen in the high parietal bone bilaterally. 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 3. 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 4. CT head   with contrast recommended for further evaluation. Electronically Signed   By: CFranchot GalloM.D.   On: 03/18/2022 17:19   DG Chest 2 View  Result Date: 03/14/2022 CLINICAL DATA:  Chest pain and shortness of breath EXAM: CHEST - 2 VIEW COMPARISON:  None Available. FINDINGS: The heart size and mediastinal contours are within normal limits. Both lungs are clear. The visualized skeletal structures are unremarkable. IMPRESSION: No acute cardiopulmonary abnormality. Electronically Signed   By: MBeryle FlockM.D.   On: 03/14/2022 11:58     Assessment and plan- Patient is a 71y.o. 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  PD-L1 testing shows PD-L1 expression of 10%.  Therefore single agent immunotherapy would not be indicated in the future.  At the time of my visit her mutational testing was not back but however at the end of the day I did receive the report which she did notShow any evidence of EGFR, ALK, BRAF, MET or ROS1 mutations.  We will therefore proceeding with [place] Alimta and Keytruda chemotherapy for cycle 2.    Patient will receive a B12 injection today.  She will continue to receive that every 9 weeks.  She will continue to take oral folic acid daily.  I will see her back in 3 weeks for cycle 2 of chemotherapy   Visit Diagnosis 1. Lung cancer metastatic to bone ([place])   2. Encounter for antineoplastic chemotherapy      Dr. Randa Evens, MD, MPH [facility] at [facility] [phone] 04/09/2022 4:00 PM

## 2022-04-09 NOTE — Progress Notes (Signed)
Met with patient after follow up visit with Dr. Janese Banks to start chemotherapy treatments. [place] report received this morning and given to Dr. Janese Banks. Pt made aware that there were no actionable mutations to target with therapy at this time and Dr. Janese Banks recommends proceeding with combination treatment with chemo+immunotherapy. Pt to receive chemo today and keytruda to be added for second cycle. Informed pt that will be given follow up appts prior to leaving today and to call with any questions or needs. Pt verbalized understanding. Nothing further needed at this time.  Dental clearance received as well and pt will start zometa at next cycle with chemo+immunotherapy per Dr. Janese Banks.

## 2022-04-09 NOTE — Progress Notes (Signed)
Pt and husband in for follow up and new chemo start today.

## 2022-04-10 ENCOUNTER — Telehealth: Payer: Self-pay

## 2022-04-10 LAB — CEA: CEA: 367 ng/mL — ABNORMAL HIGH (ref 0.0–4.7)

## 2022-04-10 NOTE — Telephone Encounter (Signed)
Telephone call to patient for follow up after receiving first infusion.   Patient states infusion went great.  States eating good and drinking plenty of fluids.   Denies any nausea or vomiting.  Encouraged patient to call for any concerns or questions. 

## 2022-04-13 ENCOUNTER — Encounter: Payer: Self-pay | Admitting: Oncology

## 2022-04-14 ENCOUNTER — Encounter (HOSPITAL_COMMUNITY): Payer: Self-pay

## 2022-04-25 ENCOUNTER — Ambulatory Visit
Admission: RE | Admit: 2022-04-25 | Discharge: 2022-04-25 | Disposition: A | Discharge status: Home / Self Care | Payer: PPO | Attending: Oncology | Admitting: Oncology

## 2022-04-25 ENCOUNTER — Encounter: Payer: Self-pay | Admitting: *Deleted

## 2022-04-25 ENCOUNTER — Ambulatory Visit
Admission: RE | Admit: 2022-04-25 | Discharge: 2022-04-25 | Disposition: A | Discharge status: Home / Self Care | Payer: PPO | Source: Ambulatory Visit | Attending: Oncology | Admitting: Oncology

## 2022-04-25 DIAGNOSIS — C7951 Secondary malignant neoplasm of bone: Secondary | ICD-10-CM | POA: Diagnosis present

## 2022-04-25 DIAGNOSIS — C3490 Malignant neoplasm of unspecified part of unspecified bronchus or lung: Secondary | ICD-10-CM | POA: Insufficient documentation

## 2022-04-25 DIAGNOSIS — J189 Pneumonia, unspecified organism: Secondary | ICD-10-CM

## 2022-04-25 MED ORDER — AMOXICILLIN-POT CLAVULANATE 875-125 MG PO TABS: 1.0000 | ORAL_TABLET | Freq: Two times a day (BID) | ORAL | 0 refills | Status: DC

## 2022-04-25 NOTE — Addendum Note (Signed)
Addended by: Telford Nab on: 04/25/2022 04:48 PM   Modules accepted: Orders

## 2022-04-25 NOTE — Progress Notes (Signed)
Pt made aware of chest xray results. Dr. Janese Banks recommends to start augmentin BID x 7 days. Prescription sent into pharmacy and pt made aware.

## 2022-04-25 NOTE — Progress Notes (Signed)
Pt called in to report having symptoms of worsening shortness of breath that has gradually worsened over the past few days. Previous CT scan did show small right pleural effusion. Dr. Janese Banks recommends proceeding with chest xray today and can schedule thoracentesis if fluid has accumulated. Advised pt that if symptoms worsen over the weekend that she would need further evaluation at the ER. Will call pt with chest xray results once available. Advised pt to go to the [facility] this afternoon for chest xray. Pt verbalized understanding. Nothing further needed at this time.

## 2022-04-29 ENCOUNTER — Encounter: Payer: Self-pay | Admitting: Oncology

## 2022-04-29 NOTE — Progress Notes (Addendum)
                                                                                                                                                             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   Intent: Curative  Radiation Treatment Dates: 03/26/2022 through 04/04/2022//PTV-1: Fractionated SRS to a painful 3.6 cm calvarial metastasis in the mid-occipital region invading the parietal brain Site Technique Total Dose (Gy) Dose per Fx (Gy) Completed Fx Beam Energies  Brain: Brain IMRT 30/30 6 5/5 6XFFF   Narrative: The patient tolerated radiation therapy relatively well and was discharged home in stable condition following each treatment.  Plan: The patient will receive a call in about one month from the radiation oncology department. She will continue follow up with Dr. Janese Banks, in medical oncology, as well.   Nicholos Johns, PA-C    Tyler Pita, MD  [facility] Direct Dial: [phone]  Fax: [phone] .com  Skype  LinkedIn

## 2022-05-05 ENCOUNTER — Other Ambulatory Visit: Payer: Self-pay

## 2022-05-05 DIAGNOSIS — C7931 Secondary malignant neoplasm of brain: Secondary | ICD-10-CM

## 2022-05-06 MED FILL — Fosaprepitant Dimeglumine For IV Infusion 150 MG (Base Eq): INTRAVENOUS | Qty: 5 | Status: AC

## 2022-05-06 MED FILL — Dexamethasone Sodium Phosphate Inj 100 MG/10ML: INTRAMUSCULAR | Qty: 1 | Status: AC

## 2022-05-07 ENCOUNTER — Inpatient Hospital Stay (HOSPITAL_BASED_OUTPATIENT_CLINIC_OR_DEPARTMENT_OTHER): Payer: PPO | Admitting: Oncology

## 2022-05-07 ENCOUNTER — Inpatient Hospital Stay: Payer: PPO

## 2022-05-07 ENCOUNTER — Other Ambulatory Visit: Payer: Self-pay

## 2022-05-07 ENCOUNTER — Encounter: Payer: Self-pay | Admitting: Oncology

## 2022-05-07 ENCOUNTER — Encounter: Payer: Self-pay | Admitting: *Deleted

## 2022-05-07 ENCOUNTER — Other Ambulatory Visit: Payer: Self-pay | Admitting: *Deleted

## 2022-05-07 VITALS — BP 124/76 | HR 100 | Temp 98.5°F | Resp 20 | Wt 163.0 lb

## 2022-05-07 DIAGNOSIS — R0602 Shortness of breath: Secondary | ICD-10-CM

## 2022-05-07 DIAGNOSIS — C3490 Malignant neoplasm of unspecified part of unspecified bronchus or lung: Secondary | ICD-10-CM

## 2022-05-07 DIAGNOSIS — R110 Nausea: Secondary | ICD-10-CM | POA: Diagnosis not present

## 2022-05-07 DIAGNOSIS — T451X5A Adverse effect of antineoplastic and immunosuppressive drugs, initial encounter: Secondary | ICD-10-CM

## 2022-05-07 DIAGNOSIS — Z5181 Encounter for therapeutic drug level monitoring: Secondary | ICD-10-CM

## 2022-05-07 DIAGNOSIS — Z7983 Long term (current) use of bisphosphonates: Secondary | ICD-10-CM

## 2022-05-07 DIAGNOSIS — C7951 Secondary malignant neoplasm of bone: Secondary | ICD-10-CM

## 2022-05-07 DIAGNOSIS — Z5112 Encounter for antineoplastic immunotherapy: Secondary | ICD-10-CM | POA: Diagnosis not present

## 2022-05-07 DIAGNOSIS — E876 Hypokalemia: Secondary | ICD-10-CM

## 2022-05-07 DIAGNOSIS — Z5111 Encounter for antineoplastic chemotherapy: Secondary | ICD-10-CM

## 2022-05-07 LAB — CBC WITH DIFFERENTIAL/PLATELET
Abs Immature Granulocytes: 0.11 10*3/uL — ABNORMAL HIGH (ref 0.00–0.07)
Basophils Absolute: 0 10*3/uL (ref 0.0–0.1)
Basophils Relative: 0 %
Eosinophils Absolute: 0 10*3/uL (ref 0.0–0.5)
Eosinophils Relative: 0 %
HCT: 38.5 % (ref 36.0–46.0)
Hemoglobin: 12.8 g/dL (ref 12.0–15.0)
Immature Granulocytes: 1 %
Lymphocytes Relative: 8 %
Lymphs Abs: 0.9 10*3/uL (ref 0.7–4.0)
MCH: 31 pg (ref 26.0–34.0)
MCHC: 33.2 g/dL (ref 30.0–36.0)
MCV: 93.2 fL (ref 80.0–100.0)
Monocytes Absolute: 0.6 10*3/uL (ref 0.1–1.0)
Monocytes Relative: 5 %
Neutro Abs: 10.3 10*3/uL — ABNORMAL HIGH (ref 1.7–7.7)
Neutrophils Relative %: 86 %
Platelets: 304 10*3/uL (ref 150–400)
RBC: 4.13 MIL/uL (ref 3.87–5.11)
RDW: 13.4 % (ref 11.5–15.5)
WBC: 11.9 10*3/uL — ABNORMAL HIGH (ref 4.0–10.5)
nRBC: 0 % (ref 0.0–0.2)

## 2022-05-07 LAB — COMPREHENSIVE METABOLIC PANEL
ALT: 14 U/L (ref 0–44)
AST: 27 U/L (ref 15–41)
Albumin: 3.2 g/dL — ABNORMAL LOW (ref 3.5–5.0)
Alkaline Phosphatase: 69 U/L (ref 38–126)
Anion gap: 11 (ref 5–15)
BUN: 18 mg/dL (ref 8–23)
CO2: 24 mmol/L (ref 22–32)
Calcium: 9.5 mg/dL (ref 8.9–10.3)
Chloride: 101 mmol/L (ref 98–111)
Creatinine, Ser: 0.85 mg/dL (ref 0.44–1.00)
GFR, Estimated: >60 mL/min (ref >60)
Glucose, Bld: 225 mg/dL — ABNORMAL HIGH (ref 70–99)
Potassium: 3.4 mmol/L — ABNORMAL LOW (ref 3.5–5.1)
Sodium: 136 mmol/L (ref 135–145)
Total Bilirubin: 0.5 mg/dL (ref 0.3–1.2)
Total Protein: 7 g/dL (ref 6.5–8.1)

## 2022-05-07 LAB — TSH: TSH: 1.091 u[IU]/mL (ref 0.350–4.500)

## 2022-05-07 MED ORDER — PALONOSETRON HCL INJECTION 0.25 MG/5ML
0.2500 mg | Freq: Once | INTRAVENOUS | Status: AC
  Administered 2022-05-07: 0.25 mg via INTRAVENOUS
  Filled 2022-05-07: qty 5

## 2022-05-07 MED ORDER — ALBUTEROL SULFATE HFA 108 (90 BASE) MCG/ACT IN AERS: 2.0000 | INHALATION_SPRAY | Freq: Four times a day (QID) | RESPIRATORY_TRACT | 2 refills | Status: AC | PRN

## 2022-05-07 MED ORDER — SODIUM CHLORIDE 0.9 % IV SOLN
Freq: Once | INTRAVENOUS | Status: AC
  Filled 2022-05-07: qty 250

## 2022-05-07 MED ORDER — OLANZAPINE 10 MG PO TABS: 10.0000 mg | ORAL_TABLET | Freq: Every day | ORAL | 2 refills | Status: AC

## 2022-05-07 MED ORDER — POTASSIUM CHLORIDE CRYS ER 20 MEQ PO TBCR: 20.0000 meq | EXTENDED_RELEASE_TABLET | Freq: Every day | ORAL | 0 refills | Status: DC

## 2022-05-07 MED ORDER — ZOLEDRONIC ACID 4 MG/100ML IV SOLN
4.0000 mg | INTRAVENOUS | Status: DC
  Administered 2022-05-07: 4 mg via INTRAVENOUS
  Filled 2022-05-07: qty 100

## 2022-05-07 MED ORDER — SODIUM CHLORIDE 0.9 % IV SOLN
500.0000 mg/m2 | Freq: Once | INTRAVENOUS | Status: AC
  Administered 2022-05-07: 900 mg via INTRAVENOUS
  Filled 2022-05-07: qty 20

## 2022-05-07 MED ORDER — SODIUM CHLORIDE 0.9 % IV SOLN
150.0000 mg | Freq: Once | INTRAVENOUS | Status: AC
  Administered 2022-05-07: 150 mg via INTRAVENOUS
  Filled 2022-05-07: qty 150

## 2022-05-07 MED ORDER — HEPARIN SOD (PORK) LOCK FLUSH 100 UNIT/ML IV SOLN
500.0000 [IU] | Freq: Once | INTRAVENOUS | Status: AC | PRN
  Administered 2022-05-07: 500 [IU]
  Filled 2022-05-07: qty 5

## 2022-05-07 MED ORDER — SODIUM CHLORIDE 0.9 % IV SOLN
200.0000 mg | Freq: Once | INTRAVENOUS | Status: AC
  Administered 2022-05-07: 200 mg via INTRAVENOUS
  Filled 2022-05-07: qty 8

## 2022-05-07 MED ORDER — SODIUM CHLORIDE 0.9 % IV SOLN
10.0000 mg | Freq: Once | INTRAVENOUS | Status: AC
  Administered 2022-05-07: 10 mg via INTRAVENOUS
  Filled 2022-05-07: qty 10

## 2022-05-07 MED ORDER — SODIUM CHLORIDE 0.9 % IV SOLN
440.0000 mg | Freq: Once | INTRAVENOUS | Status: AC
  Administered 2022-05-07: 440 mg via INTRAVENOUS
  Filled 2022-05-07: qty 44

## 2022-05-07 NOTE — Progress Notes (Signed)
Hematology/Oncology Consult note [facility]  Telephone:([phone] [phone]  Patient Care Team: [name], MD as PCP - General (Internal Medicine) [name], MD (Internal Medicine) Bary Castilla Forest Gleason, MD (General Surgery) Telford Nab, RN as Oncology Nurse Navigator   Name of the patient: Lorraine Mills  [id number]  12-26-1950   Date of visit: 05/07/22  Diagnosis- stage IVA mucinous adenocarcinoma of the lung   Chief complaint/ Reason for visit-on treatment assessment prior to cycle 2 of carbo Alimta and Keytruda chemotherapy   Heme/Onc history: Patient is a 71 year old female who presented to [facility] with symptoms of significant headache.  MRI brain without contrast showed multiple skull lesions with the largest part in the occipital bone with adjacent intracranial extension of the tumor.  Smaller lesions in the high parietal bone bilaterally.  These most likely represent metastatic disease.  Extraosseous extension of the tumor in the left occipital lobe with edema.  CT chest abdomen and pelvis with contrast showed multiple enlarged mediastinal and thoracic lymph nodes in the right.  Bilateral lung nodules.  Lucent bone lesions in T7 and T11.  Right adrenal mass measuring 3.6 cm.  Bone lesions involving the left iliac and right iliac.  Patient underwent bronchoscopy and a biopsy of the hilar and mediastinal lymph nodes which was consistent with mucinous adenocarcinoma that was positive for CK7.  Negative for TTF-1 and Napsin A.  Negative for CK20 and CDX2.  This is compatible with lung primary however differential diagnosis includes GI or pancreaticobiliary origin.    The pattern of spread involving adrenal gland, bones as well as mediastinal adenopathy and spiculated lung nodules is more consistent with lung primary and patient has been treated as such.  Plan is to proceed with [place] Al[place] Keytruda palliative chemotherapy  NGS  testing did not show any actionable mutations.  PD-L1 10%.  K-ras G12 Vmutation  Interval history-headaches are currently well-controlled with pain medicines.  She continues to report ongoing shortness of breath with exertion.  Nausea was particularly bothersome during cycle 1 of chemotherapy.  She did not feel that the as needed nausea medications helped her  ECOG PS- 2 Pain scale- 2 Opioid associated constipation- no  Review of systems- Review of Systems  Constitutional:  Positive for malaise/fatigue. Negative for chills, fever and weight loss.  HENT:  Negative for congestion, ear discharge and nosebleeds.   Eyes:  Negative for blurred vision.  Respiratory:  Positive for shortness of breath. Negative for cough, hemoptysis, sputum production and wheezing.   Cardiovascular:  Negative for chest pain, palpitations, orthopnea and claudication.  Gastrointestinal:  Negative for abdominal pain, blood in stool, constipation, diarrhea, heartburn, melena, nausea and vomiting.  Genitourinary:  Negative for dysuria, flank pain, frequency, hematuria and urgency.  Musculoskeletal:  Negative for back pain, joint pain and myalgias.  Skin:  Negative for rash.  Neurological:  Negative for dizziness, tingling, focal weakness, seizures, weakness and headaches.  Endo/Heme/Allergies:  Does not bruise/bleed easily.  Psychiatric/Behavioral:  Negative for depression and suicidal ideas. The patient does not have insomnia.       Allergies  Allergen Reactions   Latex Rash   Levofloxacin     FLUOROQUINOLONES - unknown reaction     Past Medical History:  Diagnosis Date   Anxiety    Arthritis    Depression 01/31/2016   Goiter    Hyperlipidemia    TGs   Hypothyroidism    Seasonal allergies  Past Surgical History:  Procedure Laterality Date   ABDOMINAL HYSTERECTOMY     supracervical    BILATERAL OOPHORECTOMY  2001   BENIGN TUMOR   BREAST BIOPSY Left 1985   cyst    BRONCHIAL NEEDLE ASPIRATION  BIOPSY  03/25/2022   Procedure: BRONCHIAL NEEDLE ASPIRATION BIOPSIES;  Surgeon: Lanier Clam, MD;  Location: [facility];  Service: Endoscopy;;   CATARACT EXTRACTION Right    COLONOSCOPY  08/17/2009   COLONOSCOPY WITH PROPOFOL N/A 03/19/2016   Procedure: COLONOSCOPY WITH PROPOFOL;  Surgeon: Robert Bellow, MD;  Location: [facility] ENDOSCOPY;  Service: Endoscopy;  Laterality: N/A;   COLONOSCOPY WITH PROPOFOL N/A 05/15/2021   Procedure: COLONOSCOPY WITH PROPOFOL;  Surgeon: Robert Bellow, MD;  Location: [facility] ENDOSCOPY;  Service: Endoscopy;  Laterality: N/A;   CORNEAL TRANSPLANT Right    ENDOBRONCHIAL ULTRASOUND N/A 03/25/2022   Procedure: ENDOBRONCHIAL ULTRASOUND;  Surgeon: Lanier Clam, MD;  Location: [facility];  Service: Endoscopy;  Laterality: N/A;   EYE SURGERY     IR IMAGING GUIDED PORT INSERTION  03/26/2022   JOINT REPLACEMENT     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   Procedure: TOTAL HIP ARTHROPLASTY ANTERIOR APPROACH;  Surgeon: Hessie Knows, MD;  Location: [facility] ORS;  Service: Orthopedics;  Laterality: Right;   VIDEO BRONCHOSCOPY  03/25/2022   Procedure: VIDEO BRONCHOSCOPY WITHOUT FLUORO;  Surgeon: Lanier Clam, MD;  Location: [facility];  Service: Endoscopy;;    Social History   Socioeconomic History   Marital status: Married    Spouse name: Not on file   Number of children: Not on file   Years of education: Not on file   Highest education level: Not on file  Occupational History   Occupation: retired  Tobacco Use   Smoking status: Former    Packs/day: 1.00    Years: 24.00    Total pack years: 24.00    Types: Cigarettes    Quit date: 05/09/1998    Years since quitting: 24.0   Smokeless tobacco: Never  Vaping Use   Vaping Use: Never used  Substance and Sexual Activity   Alcohol use: Not Currently    Comment: rarely    Drug use: No   Sexual activity: Not on file  Other Topics Concern   Not on file  Social History Narrative   Not  on file   Social Determinants of Health   Financial Resource Strain: Low Risk  (03/29/2021)   Overall Financial Resource Strain (CARDIA)    Difficulty of Paying Living Expenses: Not hard at all  Food Insecurity: No Food Insecurity (03/20/2022)   Hunger Vital Sign    Worried About Running Out of Food in the Last Year: Never true    [place] in the Last Year: Never true  Transportation Needs: No Transportation Needs (03/20/2022)   PRAPARE - Hydrologist (Medical): No    Lack of Transportation (Non-Medical): No  Physical Activity: Insufficiently Active (03/29/2021)   Exercise Vital Sign    Days of Exercise per Week: 3 days    Minutes of Exercise per Session: 30 min  Stress: No Stress Concern Present (03/29/2021)   [place]    Feeling of Stress : Not at all  Social Connections: Unknown (03/29/2021)   Social Connection and Isolation Panel [NHANES]    Frequency of Communication with Friends and Family: Not on file    Frequency of Social Gatherings with Friends and  Family: Not on file    Attends Religious Services: Not on file    Active Member of Clubs or Organizations: Not on file    Attends Club or Organization Meetings: Not on file    Marital Status: Married  Intimate Partner Violence: Not At Risk (03/20/2022)   Humiliation, Afraid, Rape, and Kick questionnaire    Fear of Current or Ex-Partner: No    Emotionally Abused: No    Physically Abused: No    Sexually Abused: No    Family History  Problem Relation Age of Onset   Cancer Mother        OVARIAN   Stroke Father    Cancer Sister        breast   Breast cancer Sister 18   Atrial fibrillation Brother    Thyroid disease Other        MULTIPLE FAMILY MEMBERS     Current Outpatient Medications:    acetaminophen (TYLENOL) 500 MG tablet, Take 1,000 mg by mouth every 6 (six) hours as needed for moderate pain., Disp: , Rfl:     ALPRAZolam (XANAX) 0.5 MG tablet, Take 1 tablet (0.5 mg total) by mouth 2 (two) times daily as needed for anxiety., Disp: 60 tablet, Rfl: 2   amoxicillin-clavulanate (AUGMENTIN) 875-125 MG tablet, Take 1 tablet by mouth 2 (two) times daily., Disp: 14 tablet, Rfl: 0   dexamethasone (DECADRON) 2 MG tablet, Take 1 tablet (2 mg total) by mouth daily. (Patient taking differently: Take 2 mg by mouth 2 (two) times daily.), Disp: 30 tablet, Rfl: 1   dexamethasone (DECADRON) 4 MG tablet, Take 1 tab 2 times daily starting day before pemetrexed. Then take 2 tabs daily x 3 days starting day after carboplatin. Take with food., Disp: 30 tablet, Rfl: 1   fenofibrate micronized (LOFIBRA) 134 MG capsule, Take 1 capsule (134 mg total) by mouth daily before breakfast., Disp: 30 capsule, Rfl: 11   folic acid (FOLVITE) 1 MG tablet, Take 1 tablet (1 mg total) by mouth daily. Start 7 days before pemetrexed chemotherapy. Continue until 21 days after pemetrexed completed., Disp: 100 tablet, Rfl: 3   levothyroxine (SYNTHROID) 75 MCG tablet, TAKE 1 TABLET EVERY DAY ON EMPTY STOMACHWITH A GLASS OF WATER AT LEAST 30-60 MINBEFORE BREAKFAST (Patient taking differently: Take 75 mcg by mouth daily before breakfast. On an empty stomach with a glass of water at least 30-60 minutes before breakfast.), Disp: 90 tablet, Rfl: 1   lidocaine-prilocaine (EMLA) cream, Apply to affected area once, Disp: 30 g, Rfl: 3   omeprazole (PRILOSEC) 20 MG capsule, TAKE 1 CAPSULE BY MOUTH DAILY (Patient taking differently: Take 20 mg by mouth daily.), Disp: 30 capsule, Rfl: 3   ondansetron (ZOFRAN) 4 MG tablet, Take 1 tablet (4 mg total) by mouth every 6 (six) hours as needed for nausea. (Patient not taking: Reported on 04/03/2022), Disp: 30 tablet, Rfl: 1   ondansetron (ZOFRAN) 8 MG tablet, Take 1 tablet (8 mg total) by mouth every 8 (eight) hours as needed for nausea or vomiting. Start on the third day after carboplatin. (Patient not taking: Reported on  04/09/2022), Disp: 30 tablet, Rfl: 1   oxyCODONE (OXY IR/ROXICODONE) 5 MG immediate release tablet, Take 1 tablet (5 mg total) by mouth every 6 (six) hours as needed (pain). (Patient not taking: Reported on 04/09/2022), Disp: 60 tablet, Rfl: 0   prednisoLONE acetate (PRED FORTE) 1 % ophthalmic suspension, Place 1 drop into the right eye daily., Disp: , Rfl:    prochlorperazine (  COMPAZINE) 10 MG tablet, Take 1 tablet (10 mg total) by mouth every 6 (six) hours as needed for nausea or vomiting. (Patient not taking: Reported on 04/09/2022), Disp: 30 tablet, Rfl: 1   promethazine (PHENERGAN) 12.5 MG suppository, Place 1 suppository (12.5 mg total) rectally every 6 (six) hours as needed for nausea or vomiting. (Patient not taking: Reported on 04/09/2022), Disp: 12 each, Rfl: 0   promethazine (PHENERGAN) 12.5 MG tablet, Take 1 tablet (12.5 mg total) by mouth every 8 (eight) hours as needed for nausea or vomiting. (Patient not taking: Reported on 04/09/2022), Disp: 30 tablet, Rfl: 0   RESTASIS 0.05 % ophthalmic emulsion, Place 1 drop into both eyes 2 (two) times daily., Disp: , Rfl:    rosuvastatin (CRESTOR) 10 MG tablet, TAKE ONE TABLET EVERY DAY. (Patient taking differently: Take 10 mg by mouth daily.), Disp: 90 tablet, Rfl: 3   traZODone (DESYREL) 50 MG tablet, TAKE ONE TABLET AT BEDTIME AS NEEDED FORSLEEP. (Patient not taking: Reported on 04/09/2022), Disp: 90 tablet, Rfl: 1  Physical exam:  Vitals:   05/07/22 0918  BP: 124/76  Pulse: 100  Resp: 20  Temp: 98.5 F (36.9 C)  TempSrc: Tympanic  SpO2: 92%  Weight: 163 lb (73.9 kg)   Physical Exam Constitutional:      General: She is not in acute distress.    Comments: Appears fatigued.  Sitting in a wheelchair  Cardiovascular:     Rate and Rhythm: Normal rate and regular rhythm.     Heart sounds: Normal heart sounds.  Pulmonary:     Effort: Pulmonary effort is normal.     Breath sounds: Normal breath sounds.  Abdominal:     General: Bowel sounds  are normal.     Palpations: Abdomen is soft.  Skin:    General: Skin is warm and dry.  Neurological:     Mental Status: She is alert and oriented to person, place, and time.         Latest Ref Rng & Units 04/09/2022    8:51 AM  CMP  Glucose 70 - 99 mg/dL 89   BUN 8 - 23 mg/dL 30   Creatinine 0.44 - 1.00 mg/dL 0.63   Sodium 135 - 145 mmol/L 136   Potassium 3.5 - 5.1 mmol/L 4.0   Chloride 98 - 111 mmol/L 104   CO2 22 - 32 mmol/L 25   Calcium 8.9 - 10.3 mg/dL 9.3   Total Protein 6.5 - 8.1 g/dL 6.6   Total Bilirubin 0.3 - 1.2 mg/dL 0.3   Alkaline Phos 38 - 126 U/L 78   AST 15 - 41 U/L 18   ALT 0 - 44 U/L 18       Latest Ref Rng & Units 04/09/2022    8:51 AM  CBC  WBC 4.0 - 10.5 K/uL 18.8   Hemoglobin 12.0 - 15.0 g/dL 15.0   Hematocrit 36.0 - 46.0 % 44.6   Platelets 150 - 400 K/uL 313     No images are attached to the encounter.  DG Chest 2 View  Result Date: 04/25/2022 CLINICAL DATA:  Shortness of breath EXAM: CHEST - 2 VIEW COMPARISON:  03/25/2022 FINDINGS: Cardiac size is within normal limits. Right hemidiaphragm is elevated. There are small patchy infiltrates in right lower lung field. There are no signs of pulmonary edema. There is no pleural effusion or pneumothorax. Tip of right IJ chest port is seen in superior vena cava. IMPRESSION: There are patchy linear densities in right lower  lung fields suggesting atelectasis/pneumonia. Electronically Signed   By: Elmer Picker M.D.   On: 04/25/2022 16:37     Assessment and plan- Patient is a 71 y.o. female with metastatic lung adenocarcinoma here for on treatment assessment prior to cycle 2 of carbo Alimta Keytruda chemotherapy  Counts okay to proceed with cycle 2 of carbo Alimta Keytruda chemotherapy today.  NGS testing did not show any actionable mutations and therefore we are proceeding with adding Keytruda to [place] Al[place] regimen today.  Plan to repeat scans after 4 cycles.  Her baseline CEA is elevated and we will  use that to monitor response to treatment as well.  Patient has evidence of bone metastases.  She will therefore be starting Zometa today.  Dental clearance has been obtained.  Discussed risks and benefits of Zometa including all but not limited to hypocalcemia and possible osteonecrosis of the jaw.  Patient understands and agrees to proceed as planned  Hypokalemia: Will give her oral potassium.  Dyspnea: Likely secondary to underlying malignancy.  I will give her a trial of albuterol  Chemo-induced nausea: Will add olanzapine to her regimen 10 mg nightly   Visit Diagnosis 1. Lung cancer metastatic to bone ([place])   2. Encounter for antineoplastic chemotherapy   3. Chemotherapy-induced nausea   4. Shortness of breath   5. Encounter for antineoplastic immunotherapy   6. Encounter for monitoring zoledronic acid therapy   7. Hypokalemia      Dr. Randa Evens, MD, MPH [facility] at [facility] [phone] 05/07/2022 8:49 AM

## 2022-05-07 NOTE — Progress Notes (Signed)
Pt and husband in for follow up and treatment today.  Pt reports continued shortness of breath on very minimal exertion. Pt reports decreased appetite for past 2 days. Pt states she has pain in back of head, "a knot and is painful often".

## 2022-05-07 NOTE — Patient Instructions (Signed)
[facility]CER CTR AT [place]  Discharge Instructions: Thank you for choosing [facility] to provide your oncology and hematology care.  If you have a lab appointment with the [facility], please go directly to the [facility] and check in at the registration area.  Wear comfortable clothing and clothing appropriate for easy access to any Portacath or PICC line.   We strive to give you quality time with your provider. You may need to reschedule your appointment if you arrive late (15 or more minutes).  Arriving late affects you and other patients whose appointments are after yours.  Also, if you miss three or more appointments without notifying the office, you may be dismissed from the clinic at the provider's discretion.      For prescription refill requests, have your pharmacy contact our office and allow 72 hours for refills to be completed.    Today you received the following chemotherapy and/or immunotherapy agents added today to treatment: Zometa and Keytruda.      To help prevent nausea and vomiting after your treatment, we encourage you to take your nausea medication as directed.  BELOW ARE SYMPTOMS THAT SHOULD BE REPORTED IMMEDIATELY: *FEVER GREATER THAN 100.4 F (38 C) OR HIGHER *CHILLS OR SWEATING *NAUSEA AND VOMITING THAT IS NOT CONTROLLED WITH YOUR NAUSEA MEDICATION *UNUSUAL SHORTNESS OF BREATH *UNUSUAL BRUISING OR BLEEDING *URINARY PROBLEMS (pain or burning when urinating, or frequent urination) *BOWEL PROBLEMS (unusual diarrhea, constipation, pain near the anus) TENDERNESS IN MOUTH AND THROAT WITH OR WITHOUT PRESENCE OF ULCERS (sore throat, sores in mouth, or a toothache) UNUSUAL RASH, SWELLING OR PAIN  UNUSUAL VAGINAL DISCHARGE OR ITCHING   Items with * indicate a potential emergency and should be followed up as soon as possible or go to the Emergency Department if any problems should occur.  Please show the CHEMOTHERAPY ALERT CARD or  IMMUNOTHERAPY ALERT CARD at check-in to the Emergency Department and triage nurse.  Should you have questions after your visit or need to cancel or reschedule your appointment, please contact [facility] CANCER [place] AT [place]  [phone] and follow the prompts.  Office hours are 8:00 a.m. to 4:30 p.m. Monday - Friday. Please note that voicemails left after 4:00 p.m. may not be returned until the following business day.  We are closed weekends and major holidays. You have access to a nurse at all times for urgent questions. Please call the main number to the clinic [phone] and follow the prompts.  For any non-urgent questions, you may also contact your provider using MyChart. We now offer e-Visits for anyone 71 and older to request care online for non-urgent symptoms. For details visit [email or web address].   Also download the MyChart app! Go to the app store, search "MyChart", open the app, select [facility], and log in with your MyChart username and password.  Masks are optional in the cancer centers. If you would like for your care team to wear a mask while they are taking care of you, please let them know. For doctor visits, patients may have with them one support person who is at least 71 years old. At this time, visitors are not allowed in the infusion area.

## 2022-05-07 NOTE — Addendum Note (Signed)
Addended by: Luella Cook on: 05/07/2022 10:47 AM   Modules accepted: Orders

## 2022-05-12 ENCOUNTER — Inpatient Hospital Stay (HOSPITAL_BASED_OUTPATIENT_CLINIC_OR_DEPARTMENT_OTHER): Payer: PPO | Admitting: Hospice and Palliative Medicine

## 2022-05-12 ENCOUNTER — Encounter: Payer: Self-pay | Admitting: *Deleted

## 2022-05-12 ENCOUNTER — Inpatient Hospital Stay: Payer: PPO

## 2022-05-12 ENCOUNTER — Inpatient Hospital Stay: Payer: PPO | Attending: Oncology

## 2022-05-12 ENCOUNTER — Other Ambulatory Visit: Payer: Self-pay

## 2022-05-12 VITALS — BP 104/68 | HR 66 | Temp 98.6°F | Resp 20

## 2022-05-12 DIAGNOSIS — Z5112 Encounter for antineoplastic immunotherapy: Secondary | ICD-10-CM | POA: Insufficient documentation

## 2022-05-12 DIAGNOSIS — Z79899 Other long term (current) drug therapy: Secondary | ICD-10-CM | POA: Insufficient documentation

## 2022-05-12 DIAGNOSIS — C779 Secondary and unspecified malignant neoplasm of lymph node, unspecified: Secondary | ICD-10-CM | POA: Insufficient documentation

## 2022-05-12 DIAGNOSIS — C3411 Malignant neoplasm of upper lobe, right bronchus or lung: Secondary | ICD-10-CM

## 2022-05-12 DIAGNOSIS — C7971 Secondary malignant neoplasm of right adrenal gland: Secondary | ICD-10-CM | POA: Diagnosis not present

## 2022-05-12 DIAGNOSIS — G893 Neoplasm related pain (acute) (chronic): Secondary | ICD-10-CM | POA: Diagnosis not present

## 2022-05-12 DIAGNOSIS — C7972 Secondary malignant neoplasm of left adrenal gland: Secondary | ICD-10-CM | POA: Insufficient documentation

## 2022-05-12 DIAGNOSIS — C7951 Secondary malignant neoplasm of bone: Secondary | ICD-10-CM | POA: Insufficient documentation

## 2022-05-12 DIAGNOSIS — C3490 Malignant neoplasm of unspecified part of unspecified bronchus or lung: Secondary | ICD-10-CM | POA: Diagnosis present

## 2022-05-12 DIAGNOSIS — E860 Dehydration: Secondary | ICD-10-CM | POA: Insufficient documentation

## 2022-05-12 DIAGNOSIS — Z5111 Encounter for antineoplastic chemotherapy: Secondary | ICD-10-CM | POA: Insufficient documentation

## 2022-05-12 LAB — CBC WITH DIFFERENTIAL/PLATELET
Abs Immature Granulocytes: 0.06 10*3/uL (ref 0.00–0.07)
Basophils Absolute: 0 10*3/uL (ref 0.0–0.1)
Basophils Relative: 0 %
Eosinophils Absolute: 0 10*3/uL (ref 0.0–0.5)
Eosinophils Relative: 1 %
HCT: 37.7 % (ref 36.0–46.0)
Hemoglobin: 12.5 g/dL (ref 12.0–15.0)
Immature Granulocytes: 1 %
Lymphocytes Relative: 10 %
Lymphs Abs: 0.8 10*3/uL (ref 0.7–4.0)
MCH: 30.9 pg (ref 26.0–34.0)
MCHC: 33.2 g/dL (ref 30.0–36.0)
MCV: 93.3 fL (ref 80.0–100.0)
Monocytes Absolute: 0.2 10*3/uL (ref 0.1–1.0)
Monocytes Relative: 2 %
Neutro Abs: 6.5 10*3/uL (ref 1.7–7.7)
Neutrophils Relative %: 86 %
Platelets: 202 10*3/uL (ref 150–400)
RBC: 4.04 MIL/uL (ref 3.87–5.11)
RDW: 13.7 % (ref 11.5–15.5)
Smear Review: NORMAL
WBC: 7.6 10*3/uL (ref 4.0–10.5)
nRBC: 0 % (ref 0.0–0.2)

## 2022-05-12 LAB — URINALYSIS, COMPLETE (UACMP) WITH MICROSCOPIC
Bilirubin Urine: NEGATIVE
Glucose, UA: NEGATIVE mg/dL
Ketones, ur: NEGATIVE mg/dL
Nitrite: NEGATIVE
Protein, ur: NEGATIVE mg/dL
Specific Gravity, Urine: 1.015 (ref 1.005–1.030)
WBC, UA: >50 WBC/hpf — ABNORMAL HIGH (ref 0–5)
pH: 6 (ref 5.0–8.0)

## 2022-05-12 LAB — COMPREHENSIVE METABOLIC PANEL
ALT: 17 U/L (ref 0–44)
AST: 20 U/L (ref 15–41)
Albumin: 3 g/dL — ABNORMAL LOW (ref 3.5–5.0)
Alkaline Phosphatase: 69 U/L (ref 38–126)
Anion gap: 10 (ref 5–15)
BUN: 24 mg/dL — ABNORMAL HIGH (ref 8–23)
CO2: 23 mmol/L (ref 22–32)
Calcium: 7.6 mg/dL — ABNORMAL LOW (ref 8.9–10.3)
Chloride: 102 mmol/L (ref 98–111)
Creatinine, Ser: 0.64 mg/dL (ref 0.44–1.00)
GFR, Estimated: >60 mL/min (ref >60)
Glucose, Bld: 132 mg/dL — ABNORMAL HIGH (ref 70–99)
Potassium: 4 mmol/L (ref 3.5–5.1)
Sodium: 135 mmol/L (ref 135–145)
Total Bilirubin: 1 mg/dL (ref 0.3–1.2)
Total Protein: 6.4 g/dL — ABNORMAL LOW (ref 6.5–8.1)

## 2022-05-12 LAB — MAGNESIUM: Magnesium: 2.3 mg/dL (ref 1.7–2.4)

## 2022-05-12 MED ORDER — OXYCODONE HCL 5 MG PO TABS: 5.0000 mg | ORAL_TABLET | ORAL | 0 refills | Status: DC | PRN

## 2022-05-12 MED ORDER — NITROFURANTOIN MONOHYD MACRO 100 MG PO CAPS: 100.0000 mg | ORAL_CAPSULE | Freq: Two times a day (BID) | ORAL | 0 refills | Status: DC

## 2022-05-12 MED ORDER — HEPARIN SOD (PORK) LOCK FLUSH 100 UNIT/ML IV SOLN
500.0000 [IU] | Freq: Once | INTRAVENOUS | Status: AC | PRN
  Administered 2022-05-12: 500 [IU]
  Filled 2022-05-12: qty 5

## 2022-05-12 MED ORDER — SODIUM CHLORIDE 0.9% FLUSH
10.0000 mL | Freq: Once | INTRAVENOUS | Status: AC | PRN
  Administered 2022-05-12: 10 mL
  Filled 2022-05-12: qty 10

## 2022-05-12 MED ORDER — OXYCODONE HCL 5 MG PO TABS
10.0000 mg | ORAL_TABLET | Freq: Once | ORAL | Status: AC
  Administered 2022-05-12: 10 mg via ORAL
  Filled 2022-05-12: qty 2

## 2022-05-12 MED ORDER — SODIUM CHLORIDE 0.9 % IV SOLN
Freq: Once | INTRAVENOUS | Status: AC
  Filled 2022-05-12: qty 250

## 2022-05-12 NOTE — Addendum Note (Signed)
Addended by: Altha Harm R on: 05/12/2022 04:09 PM   Modules accepted: Orders

## 2022-05-12 NOTE — Progress Notes (Addendum)
Symptom Management [place] at [facility] [phone] [phone]  Patient Care Team: [name], MD as PCP - General (Internal Medicine) [name], MD (Internal Medicine) Bary Castilla, Forest Gleason, MD (General Surgery) Telford Nab, RN as Oncology Nurse Navigator   NAME OF PATIENT: Lorraine Mills  [id number]  02/21/1951   DATE OF VISIT: 05/12/22  REASON FOR CONSULT: Lorraine Mills is a 71 y.o. 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  Patient is on systemic chemo with [place] Alimta and Keytruda.  INTERVAL HISTORY: Patient received cycle 2 carbo Alimta and Keytruda on 05/07/2022.  She requested IV fluids/supportive care today due to poor oral intake.  Patient to schedule after arriving for fluids due to low back pain.  Patient reports that she has had chronic low back pain since being diagnosed with cancer with known bone metastasis in that area.  However, pain is worse today.  Patient generally takes oxycodone 2-3 times daily but does not find it to be particularly helpful.  She is not taking any oxycodone today.  Patient denies fever or chills.  No urinary or GI symptoms.  Denies any neurologic complaints. Denies recent fevers or illnesses. Denies any easy bleeding or bruising. Reports poor appetite. Denies chest pain. Denies any nausea, vomiting, constipation, or diarrhea. Denies urinary complaints. Patient offers no further specific complaints today.   PAST MEDICAL HISTORY: Past Medical History:  Diagnosis Date   Anxiety    Arthritis    Depression 01/31/2016   Goiter    Hyperlipidemia    TGs   Hypothyroidism    Seasonal allergies     PAST SURGICAL HISTORY:  Past Surgical History:  Procedure Laterality Date   ABDOMINAL HYSTERECTOMY     supracervical    BILATERAL OOPHORECTOMY  2001   BENIGN TUMOR   BREAST BIOPSY  Left 1985   cyst    BRONCHIAL NEEDLE ASPIRATION BIOPSY  03/25/2022   Procedure: BRONCHIAL NEEDLE ASPIRATION BIOPSIES;  Surgeon: Lanier Clam, MD;  Location: [facility];  Service: Endoscopy;;   CATARACT EXTRACTION Right    COLONOSCOPY  08/17/2009   COLONOSCOPY WITH PROPOFOL N/A 03/19/2016   Procedure: COLONOSCOPY WITH PROPOFOL;  Surgeon: Robert Bellow, MD;  Location: [facility] END[facility]COPY;  Service: Endoscopy;  Laterality: N/A;   COLONOSCOPY WITH PROPOFOL N/A 05/15/2021   Procedure: COLONOSCOPY WITH PROPOFOL;  Surgeon: Robert Bellow, MD;  Location: [facility];  Service: Endoscopy;  Laterality: N/A;   CORNEAL TRANSPLANT Right    ENDOBRONCHIAL ULTRASOUND N/A 03/25/2022   Procedure: ENDOBRONCHIAL ULTRASOUND;  Surgeon: Lanier Clam, MD;  Location: [facility];  Service: Endoscopy;  Laterality: N/A;   EYE SURGERY     IR IMAGING GUIDED PORT INSERTION  03/26/2022   JOINT REPLACEMENT     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   Procedure: TOTAL HIP ARTHROPLASTY ANTERIOR APPROACH;  Surgeon: Hessie Knows, MD;  Location: [facility] ORS;  Service: Orthopedics;  Laterality: Right;   VIDEO BRONCHOSCOPY  03/25/2022   Procedure: VIDEO BRONCHOSCOPY WITHOUT FLUORO;  Surgeon: Lanier Clam, MD;  Location: [facility];  Service: Endoscopy;;    HEMATOLOGY/ONCOLOGY HISTORY:  Oncology History  Lung cancer metastatic to bone [facility])  03/21/2022 Initial Diagnosis   Lung cancer metastatic to bone ([place])   04/03/2022 Cancer Staging   Staging form: Lung, AJCC 8th Edition - Clinical stage from 04/03/2022: Stage IVB (cT1a, cN2, cM1c) - Signed by Janese Banks,  Weston Anna, MD on 04/03/2022   04/09/2022 -  Chemotherapy   Patient is on Treatment Plan : LUNG NSCLC Pemetrexed + Carboplatin q21d x 4 Cycles       ALLERGIES:  is allergic to latex and levofloxacin.  MEDICATIONS:  Current Outpatient Medications  Medication Sig Dispense Refill   acetaminophen (TYLENOL) 500 MG tablet Take 1,000 mg by  mouth every 6 (six) hours as needed for moderate pain.     albuterol (VENTOLIN HFA) 108 (90 Base) MCG/ACT inhaler Inhale 2 puffs into the lungs every 6 (six) hours as needed for wheezing or shortness of breath. 8 g 2   ALPRAZolam (XANAX) 0.5 MG tablet Take 1 tablet (0.5 mg total) by mouth 2 (two) times daily as needed for anxiety. 60 tablet 2   dexamethasone (DECADRON) 4 MG tablet Take 1 tab 2 times daily starting day before pemetrexed. Then take 2 tabs daily x 3 days starting day after carboplatin. Take with food. 30 tablet 1   fenofibrate micronized (LOFIBRA) 134 MG capsule Take 1 capsule (134 mg total) by mouth daily before breakfast. 30 capsule 11   folic acid (FOLVITE) 1 MG tablet Take 1 tablet (1 mg total) by mouth daily. Start 7 days before pemetrexed chemotherapy. Continue until 21 days after pemetrexed completed. 100 tablet 3   levothyroxine (SYNTHROID) 75 MCG tablet TAKE 1 TABLET EVERY DAY ON EMPTY STOMACHWITH A GLASS OF WATER AT LEAST 30-60 MINBEFORE BREAKFAST (Patient taking differently: Take 75 mcg by mouth daily before breakfast. On an empty stomach with a glass of water at least 30-60 minutes before breakfast.) 90 tablet 1   lidocaine-prilocaine (EMLA) cream Apply to affected area once 30 g 3   OLANZapine (ZYPREXA) 10 MG tablet Take 1 tablet (10 mg total) by mouth at bedtime. 30 tablet 2   omeprazole (PRILOSEC) 20 MG capsule TAKE 1 CAPSULE BY MOUTH DAILY (Patient taking differently: Take 20 mg by mouth daily.) 30 capsule 3   ondansetron (ZOFRAN) 4 MG tablet Take 1 tablet (4 mg total) by mouth every 6 (six) hours as needed for nausea. (Patient not taking: Reported on 04/03/2022) 30 tablet 1   ondansetron (ZOFRAN) 8 MG tablet Take 1 tablet (8 mg total) by mouth every 8 (eight) hours as needed for nausea or vomiting. Start on the third day after carboplatin. (Patient not taking: Reported on 04/09/2022) 30 tablet 1   oxyCODONE (OXY IR/ROXICODONE) 5 MG immediate release tablet Take 1 tablet (5  mg total) by mouth every 6 (six) hours as needed (pain). 60 tablet 0   potassium chloride SA (KLOR-CON M) 20 MEQ tablet Take 1 tablet (20 mEq total) by mouth daily. 14 tablet 0   prednisoLONE acetate (PRED FORTE) 1 % ophthalmic suspension Place 1 drop into the right eye daily.     prochlorperazine (COMPAZINE) 10 MG tablet Take 1 tablet (10 mg total) by mouth every 6 (six) hours as needed for nausea or vomiting. (Patient not taking: Reported on 04/09/2022) 30 tablet 1   promethazine (PHENERGAN) 12.5 MG suppository Place 1 suppository (12.5 mg total) rectally every 6 (six) hours as needed for nausea or vomiting. (Patient not taking: Reported on 04/09/2022) 12 each 0   promethazine (PHENERGAN) 12.5 MG tablet Take 1 tablet (12.5 mg total) by mouth every 8 (eight) hours as needed for nausea or vomiting. (Patient not taking: Reported on 04/09/2022) 30 tablet 0   RESTASIS 0.05 % ophthalmic emulsion Place 1 drop into both eyes 2 (two) times daily.  rosuvastatin (CRESTOR) 10 MG tablet TAKE ONE TABLET EVERY DAY. (Patient taking differently: Take 10 mg by mouth daily.) 90 tablet 3   traZODone (DESYREL) 50 MG tablet TAKE ONE TABLET AT BEDTIME AS NEEDED FORSLEEP. (Patient not taking: Reported on 04/09/2022) 90 tablet 1   No current facility-administered medications for this visit.   Facility-Administered Medications Ordered in Other Visits  Medication Dose Route Frequency Provider Last Rate Last Admin   heparin lock flush 100 unit/mL  500 Units Intracatheter Once PRN Sindy Guadeloupe, MD       oxyCODONE (Oxy IR/ROXICODONE) immediate release tablet 10 mg  10 mg Oral Once Pasha Gadison, Kirt Boys, NP        VITAL SIGNS: There were no vitals taken for this visit. There were no vitals filed for this visit.  Estimated body mass index is 30.8 kg/m as calculated from the following:   Height as of 04/01/22: 5\' 1"  (1.549 m).   Weight as of 05/07/22: 163 lb (73.9 kg).  LABS: CBC:    Component Value Date/Time   WBC 7.6  05/12/2022 1335   HGB 12.5 05/12/2022 1335   HCT 37.7 05/12/2022 1335   PLT 202 05/12/2022 1335   MCV 93.3 05/12/2022 1335   NEUTROABS PENDING 05/12/2022 1335   LYMPHSABS PENDING 05/12/2022 1335   MONOABS PENDING 05/12/2022 1335   EOSABS PENDING 05/12/2022 1335   BASOSABS PENDING 05/12/2022 1335   Comprehensive Metabolic Panel:    Component Value Date/Time   NA 135 05/12/2022 1335   NA 142 10/02/2020 0938   K 4.0 05/12/2022 1335   CL 102 05/12/2022 1335   CO2 23 05/12/2022 1335   BUN 24 (H) 05/12/2022 1335   BUN 22 10/02/2020 0938   CREATININE 0.64 05/12/2022 1335   CREATININE 0.95 01/29/2016 1429   GLUCOSE 132 (H) 05/12/2022 1335   CALCIUM 7.6 (L) 05/12/2022 1335   AST 20 05/12/2022 1335   ALT 17 05/12/2022 1335   ALKPHOS 69 05/12/2022 1335   BILITOT 1.0 05/12/2022 1335   PROT 6.4 (L) 05/12/2022 1335   ALBUMIN 3.0 (L) 05/12/2022 1335    RADIOGRAPHIC STUDIES: DG Chest 2 View  Result Date: 04/25/2022 CLINICAL DATA:  Shortness of breath EXAM: CHEST - 2 VIEW COMPARISON:  03/25/2022 FINDINGS: Cardiac size is within normal limits. Right hemidiaphragm is elevated. There are small patchy infiltrates in right lower lung field. There are no signs of pulmonary edema. There is no pleural effusion or pneumothorax. Tip of right IJ chest port is seen in superior vena cava. IMPRESSION: There are patchy linear densities in right lower lung fields suggesting atelectasis/pneumonia. Electronically Signed   By: Elmer Picker M.D.   On: 04/25/2022 16:37    PERFORMANCE STATUS (ECOG) : 2 - Symptomatic, <50% confined to bed  Review of Systems Unless otherwise noted, a complete review of systems is negative.  Physical Exam General: NAD Cardiovascular: regular rate and rhythm Pulmonary: clear ant fields Abdomen: soft, nontender, + bowel sounds GU: no suprapubic tenderness Extremities: no edema, no joint deformities Skin: no rashes Neurological: Weakness but otherwise  nonfocal  IMPRESSION/PLAN: Neoplasm related pain -likely due to known bone/spinal metastasis.  Pain is worse today the patient states that she has not taken any pain medications today either.  Will give her oxycodone 10 mg x 1 in clinic and liberalize/refill her home oxycodone.  Will avoid steroids given Keytruda.  Can consider spinal x-ray to rule out pathologic fracture if pain persists.  Poor oral intake -referral to nutrition  Cystitis -  although no active GU symptoms, UA does appear consistent with UTI. Will start empiric coverage with Macrobid x 5 days.   RTC in 2 weeks to see Dr. Janese Banks.  We can see her sooner in [facility] or fluid clinic if needed  Patient expressed understanding and was in agreement with this plan. She also understands that She can call clinic at any time with any questions, concerns, or complaints.   Thank you for allowing me to participate in the care of this very pleasant patient.   Time Total: 20 minutes  Visit consisted of counseling and education dealing with the complex and emotionally intense issues of symptom management in the setting of serious illness.Greater than 50%  of this time was spent counseling and coordinating care related to the above assessment and plan.  Signed by: Altha Harm, PhD, NP-C

## 2022-05-12 NOTE — Progress Notes (Signed)
Pt called to request appt today for IVF and states feels weak and has not been eating/drinking very well. Per Ander Purpura, okay to add on for labs/IVF today. Pt scheduled and made aware of appt. Nothing further needed at this time.

## 2022-05-13 ENCOUNTER — Ambulatory Visit
Admission: RE | Admit: 2022-05-13 | Discharge: 2022-05-13 | Disposition: A | Discharge status: Home / Self Care | Payer: PPO | Source: Ambulatory Visit | Attending: Urology | Admitting: Urology

## 2022-05-13 DIAGNOSIS — C7951 Secondary malignant neoplasm of bone: Secondary | ICD-10-CM | POA: Insufficient documentation

## 2022-05-13 DIAGNOSIS — C7931 Secondary malignant neoplasm of brain: Secondary | ICD-10-CM | POA: Insufficient documentation

## 2022-05-13 DIAGNOSIS — Z510 Encounter for antineoplastic radiation therapy: Secondary | ICD-10-CM | POA: Insufficient documentation

## 2022-05-13 DIAGNOSIS — C801 Malignant (primary) neoplasm, unspecified: Secondary | ICD-10-CM | POA: Insufficient documentation

## 2022-05-13 NOTE — Progress Notes (Signed)
  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   Intent: Curative  Radiation Treatment Dates: 03/26/2022 through 04/04/2022//PTV-1: Fractionated SRS to a painful 3.6 cm calvarial metastasis in the mid-occipital region invading the parietal brain Site Technique Total Dose (Gy) Dose per Fx (Gy) Completed Fx Beam Energies  Brain: Brain IMRT 30/30 6 5/5 6XFFF  (as documented in provider EOT note)   The patient was available for call today.  The patient did note fatigue during radiation. The patient did not note hair loss or skin changes in the field of radiation during therapy. The patient is taking dexamethasone. The patient does not have symptoms of  weakness or loss of control of the extremities. The patient does not have symptoms of headache. The patient does not have symptoms of seizure or uncontrolled movement. The patient does not have symptoms of changes in vision. The patient does not have changes in speech. The patient does not have confusion.   The patient was counseled that she will be contacted by our brain and spine navigator to schedule surveillance imaging. The patient was encouraged to call if  she have not received a call to schedule imaging, or if she develop concerns or questions regarding radiation. The patient will also continue to follow up with Dr. Janese Banks in medical oncology.  This concludes the interview.   [name], LPN

## 2022-05-14 ENCOUNTER — Ambulatory Visit: Payer: Self-pay | Admitting: Urology

## 2022-05-21 ENCOUNTER — Inpatient Hospital Stay: Payer: PPO

## 2022-05-21 NOTE — Progress Notes (Signed)
Nutrition  Called patient times 2 for scheduled telephone nutrition visit.  No answer or option to leave voicemail as mailbox full.    Corney Knighton B. Zenia Resides, [place], [place] Registered Dietitian [phone]

## 2022-05-27 ENCOUNTER — Other Ambulatory Visit: Payer: Self-pay | Admitting: *Deleted

## 2022-05-27 ENCOUNTER — Inpatient Hospital Stay: Payer: PPO

## 2022-05-27 DIAGNOSIS — C3411 Malignant neoplasm of upper lobe, right bronchus or lung: Secondary | ICD-10-CM

## 2022-05-27 DIAGNOSIS — Z5112 Encounter for antineoplastic immunotherapy: Secondary | ICD-10-CM | POA: Diagnosis not present

## 2022-05-27 MED ORDER — SODIUM CHLORIDE 0.9 % IV SOLN
10.0000 mg | Freq: Once | INTRAVENOUS | Status: DC
  Filled 2022-05-27: qty 1

## 2022-05-27 MED ORDER — PROCHLORPERAZINE EDISYLATE 10 MG/2ML IJ SOLN
10.0000 mg | Freq: Once | INTRAMUSCULAR | Status: AC
  Administered 2022-05-27: 10 mg via INTRAVENOUS
  Filled 2022-05-27: qty 2

## 2022-05-27 MED ORDER — HEPARIN SOD (PORK) LOCK FLUSH 100 UNIT/ML IV SOLN
500.0000 [IU] | Freq: Once | INTRAVENOUS | Status: AC
  Administered 2022-05-27: 500 [IU] via INTRAVENOUS
  Filled 2022-05-27: qty 5

## 2022-05-27 MED ORDER — SODIUM CHLORIDE 0.9 % IV SOLN
Freq: Once | INTRAVENOUS | Status: AC
  Filled 2022-05-27: qty 250

## 2022-05-27 MED ORDER — OXYCODONE HCL 5 MG PO TABS: 5.0000 mg | ORAL_TABLET | ORAL | 0 refills | Status: DC | PRN

## 2022-05-27 MED FILL — Fosaprepitant Dimeglumine For IV Infusion 150 MG (Base Eq): INTRAVENOUS | Qty: 5 | Status: AC

## 2022-05-27 MED FILL — Dexamethasone Sodium Phosphate Inj 100 MG/10ML: INTRAMUSCULAR | Qty: 1 | Status: AC

## 2022-05-28 ENCOUNTER — Other Ambulatory Visit: Payer: Self-pay | Admitting: *Deleted

## 2022-05-28 ENCOUNTER — Encounter: Payer: Self-pay | Admitting: Oncology

## 2022-05-28 ENCOUNTER — Inpatient Hospital Stay: Payer: PPO

## 2022-05-28 ENCOUNTER — Inpatient Hospital Stay (HOSPITAL_BASED_OUTPATIENT_CLINIC_OR_DEPARTMENT_OTHER): Payer: PPO | Admitting: Oncology

## 2022-05-28 VITALS — BP 116/51 | HR 96 | Temp 97.2°F | Resp 16 | Ht 61.0 in | Wt 163.0 lb

## 2022-05-28 DIAGNOSIS — Z5112 Encounter for antineoplastic immunotherapy: Secondary | ICD-10-CM | POA: Diagnosis not present

## 2022-05-28 DIAGNOSIS — G893 Neoplasm related pain (acute) (chronic): Secondary | ICD-10-CM

## 2022-05-28 DIAGNOSIS — C3490 Malignant neoplasm of unspecified part of unspecified bronchus or lung: Secondary | ICD-10-CM

## 2022-05-28 DIAGNOSIS — R630 Anorexia: Secondary | ICD-10-CM

## 2022-05-28 DIAGNOSIS — Z5111 Encounter for antineoplastic chemotherapy: Secondary | ICD-10-CM | POA: Diagnosis not present

## 2022-05-28 DIAGNOSIS — C7951 Secondary malignant neoplasm of bone: Secondary | ICD-10-CM

## 2022-05-28 DIAGNOSIS — E876 Hypokalemia: Secondary | ICD-10-CM

## 2022-05-28 LAB — COMPREHENSIVE METABOLIC PANEL
ALT: 16 U/L (ref 0–44)
AST: 25 U/L (ref 15–41)
Albumin: 2.7 g/dL — ABNORMAL LOW (ref 3.5–5.0)
Alkaline Phosphatase: 74 U/L (ref 38–126)
Anion gap: 9 (ref 5–15)
BUN: 8 mg/dL (ref 8–23)
CO2: 24 mmol/L (ref 22–32)
Calcium: 7.7 mg/dL — ABNORMAL LOW (ref 8.9–10.3)
Chloride: 106 mmol/L (ref 98–111)
Creatinine, Ser: 0.8 mg/dL (ref 0.44–1.00)
GFR, Estimated: >60 mL/min (ref >60)
Glucose, Bld: 188 mg/dL — ABNORMAL HIGH (ref 70–99)
Potassium: 3 mmol/L — ABNORMAL LOW (ref 3.5–5.1)
Sodium: 139 mmol/L (ref 135–145)
Total Bilirubin: 0.4 mg/dL (ref 0.3–1.2)
Total Protein: 6.5 g/dL (ref 6.5–8.1)

## 2022-05-28 LAB — CBC WITH DIFFERENTIAL/PLATELET
Abs Immature Granulocytes: 0.24 10*3/uL — ABNORMAL HIGH (ref 0.00–0.07)
Basophils Absolute: 0 10*3/uL (ref 0.0–0.1)
Basophils Relative: 1 %
Eosinophils Absolute: 0 10*3/uL (ref 0.0–0.5)
Eosinophils Relative: 0 %
HCT: 30.6 % — ABNORMAL LOW (ref 36.0–46.0)
Hemoglobin: 10.1 g/dL — ABNORMAL LOW (ref 12.0–15.0)
Immature Granulocytes: 4 %
Lymphocytes Relative: 13 %
Lymphs Abs: 0.9 10*3/uL (ref 0.7–4.0)
MCH: 30.7 pg (ref 26.0–34.0)
MCHC: 33 g/dL (ref 30.0–36.0)
MCV: 93 fL (ref 80.0–100.0)
Monocytes Absolute: 0.9 10*3/uL (ref 0.1–1.0)
Monocytes Relative: 13 %
Neutro Abs: 4.5 10*3/uL (ref 1.7–7.7)
Neutrophils Relative %: 69 %
Platelets: 537 10*3/uL — ABNORMAL HIGH (ref 150–400)
RBC: 3.29 MIL/uL — ABNORMAL LOW (ref 3.87–5.11)
RDW: 15.3 % (ref 11.5–15.5)
WBC: 6.6 10*3/uL (ref 4.0–10.5)
nRBC: 0 % (ref 0.0–0.2)

## 2022-05-28 MED ORDER — FENTANYL 12 MCG/HR TD PT72: 1.0000 | MEDICATED_PATCH | TRANSDERMAL | 0 refills | Status: DC

## 2022-05-28 MED ORDER — CYANOCOBALAMIN 1000 MCG/ML IJ SOLN
1000.0000 ug | Freq: Once | INTRAMUSCULAR | Status: AC
  Administered 2022-05-28: 1000 ug via INTRAMUSCULAR
  Filled 2022-05-28: qty 1

## 2022-05-28 MED ORDER — HEPARIN SOD (PORK) LOCK FLUSH 100 UNIT/ML IV SOLN
500.0000 [IU] | Freq: Once | INTRAVENOUS | Status: AC | PRN
  Administered 2022-05-28: 500 [IU]
  Filled 2022-05-28: qty 5

## 2022-05-28 MED ORDER — SODIUM CHLORIDE 0.9 % IV SOLN
150.0000 mg | Freq: Once | INTRAVENOUS | Status: AC
  Administered 2022-05-28: 150 mg via INTRAVENOUS
  Filled 2022-05-28: qty 150

## 2022-05-28 MED ORDER — POTASSIUM CHLORIDE CRYS ER 20 MEQ PO TBCR: 20.0000 meq | EXTENDED_RELEASE_TABLET | Freq: Every day | ORAL | 0 refills | Status: DC

## 2022-05-28 MED ORDER — SODIUM CHLORIDE 0.9 % IV SOLN
200.0000 mg | Freq: Once | INTRAVENOUS | Status: AC
  Administered 2022-05-28: 200 mg via INTRAVENOUS
  Filled 2022-05-28: qty 200

## 2022-05-28 MED ORDER — SODIUM CHLORIDE 0.9 % IV SOLN
440.0000 mg | Freq: Once | INTRAVENOUS | Status: AC
  Administered 2022-05-28: 440 mg via INTRAVENOUS
  Filled 2022-05-28: qty 44

## 2022-05-28 MED ORDER — SODIUM CHLORIDE 0.9% FLUSH
10.0000 mL | Freq: Once | INTRAVENOUS | Status: AC
  Administered 2022-05-28: 10 mL via INTRAVENOUS
  Filled 2022-05-28: qty 10

## 2022-05-28 MED ORDER — SODIUM CHLORIDE 0.9 % IV SOLN
Freq: Once | INTRAVENOUS | Status: AC
  Filled 2022-05-28: qty 250

## 2022-05-28 MED ORDER — SODIUM CHLORIDE 0.9 % IV SOLN
10.0000 mg | Freq: Once | INTRAVENOUS | Status: AC
  Administered 2022-05-28: 10 mg via INTRAVENOUS
  Filled 2022-05-28: qty 10

## 2022-05-28 MED ORDER — SODIUM CHLORIDE 0.9 % IV SOLN
500.0000 mg/m2 | Freq: Once | INTRAVENOUS | Status: AC
  Administered 2022-05-28: 900 mg via INTRAVENOUS
  Filled 2022-05-28: qty 20

## 2022-05-28 MED ORDER — PALONOSETRON HCL INJECTION 0.25 MG/5ML
0.2500 mg | Freq: Once | INTRAVENOUS | Status: AC
  Administered 2022-05-28: 0.25 mg via INTRAVENOUS
  Filled 2022-05-28: qty 5

## 2022-05-28 MED ORDER — POTASSIUM CHLORIDE IN NACL 20-0.9 MEQ/L-% IV SOLN
Freq: Once | INTRAVENOUS | Status: AC
  Filled 2022-05-28: qty 1000

## 2022-05-28 NOTE — Progress Notes (Signed)
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  MRI brain without contrast showed multiple skull lesions with the largest part in the occipital bone with adjacent intracranial extension of the tumor.  Smaller lesions in the high parietal bone bilaterally.  These most likely represent metastatic disease.  Extraosseous extension of the tumor in the left occipital lobe with edema.  CT chest abdomen and pelvis with contrast showed multiple enlarged mediastinal and thoracic lymph nodes in the right.  Bilateral lung nodules.  Lucent bone lesions in T7 and T11.  Right adrenal mass measuring 3.6 cm.  Bone lesions involving the left iliac and right iliac.  Patient underwent bronchoscopy and a biopsy of the hilar and mediastinal lymph nodes which was consistent with mucinous adenocarcinoma that was positive for CK7.  Negative for TTF-1 and Napsin A.  Negative for CK20 and CDX2.  This is compatible with lung primary however differential diagnosis includes GI or pancreaticobiliary origin.     The pattern of spread involving adrenal gland, bones as well as mediastinal adenopathy and spiculated lung nodules is more consistent with lung primary and patient has been treated as such.  Plan is to proceed with [place] Alimta Keytruda palliative chemotherapy   NGS  testing did not show any actionable mutations.  PD-L1 10%.  K-ras G12 Vmutation  Interval history-pain is not well-controlled with as needed oxycodone.  She also reports exertional shortness of breath which has existed since the time of her diagnosis of lung cancer and she does not feel it is getting better.  Sometimes she is unable to complete a full sentence because of shortness of breath.  She did feel somewhat better after receiving IV fluids in terms of fatigue after her last cycle of chemotherapy.  ECOG PS- 2 Pain scale- 4 Opioid associated constipation- no  Review of systems- Review of Systems  Constitutional:  Positive for malaise/fatigue. Negative for chills, fever and weight loss.  HENT:  Negative for congestion, ear discharge and nosebleeds.   Eyes:  Negative for blurred vision.  Respiratory:  Positive for shortness of breath. Negative for cough, hemoptysis, sputum production and wheezing.   Cardiovascular:  Negative for chest pain, palpitations, orthopnea and claudication.  Gastrointestinal:  Negative for abdominal pain, blood in stool, constipation, diarrhea, heartburn, melena, nausea and vomiting.  Genitourinary:  Negative for dysuria, flank pain, frequency, hematuria and urgency.  Musculoskeletal:  Negative for back pain, joint pain and myalgias.  Skin:  Negative for rash.  Neurological:  Negative for dizziness, tingling, focal weakness, seizures, weakness and headaches.  Endo/Heme/Allergies:  Does not bruise/bleed easily.  Psychiatric/Behavioral:  Negative for depression and suicidal ideas. The patient does not have insomnia.       Allergies  Allergen Reactions   Latex Rash   Levofloxacin     FLUOROQUINOLONES - unknown reaction     Past Medical History:  Diagnosis Date  Anxiety    Arthritis    Depression 01/31/2016   Goiter    Hyperlipidemia    TGs   Hypothyroidism    Seasonal allergies      Past Surgical History:  Procedure Laterality Date   ABDOMINAL  HYSTERECTOMY     supracervical    BILATERAL OOPHORECTOMY  2001   BENIGN TUMOR   BREAST BIOPSY Left 1985   cyst    BRONCHIAL NEEDLE ASPIRATION BIOPSY  03/25/2022   Procedure: BRONCHIAL NEEDLE ASPIRATION BIOPSIES;  Surgeon: Lanier Clam, MD;  Location: [facility];  Service: Endoscopy;;   CATARACT EXTRACTION Right    COLONOSCOPY  08/17/2009   COLONOSCOPY WITH PROPOFOL N/A 03/19/2016   Procedure: COLONOSCOPY WITH PROPOFOL;  Surgeon: Robert Bellow, MD;  Location: [facility];  Service: Endoscopy;  Laterality: N/A;   COLONOSCOPY WITH PROPOFOL N/A 05/15/2021   Procedure: COLONOSCOPY WITH PROPOFOL;  Surgeon: Robert Bellow, MD;  Location: [facility];  Service: Endoscopy;  Laterality: N/A;   CORNEAL TRANSPLANT Right    ENDOBRONCHIAL ULTRASOUND N/A 03/25/2022   Procedure: ENDOBRONCHIAL ULTRASOUND;  Surgeon: Lanier Clam, MD;  Location: [facility];  Service: Endoscopy;  Laterality: N/A;   EYE SURGERY     IR IMAGING GUIDED PORT INSERTION  03/26/2022   JOINT REPLACEMENT     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   Procedure: TOTAL HIP ARTHROPLASTY ANTERIOR APPROACH;  Surgeon: Hessie Knows, MD;  Location: [facility];  Service: Orthopedics;  Laterality: Right;   VIDEO BRONCHOSCOPY  03/25/2022   Procedure: VIDEO BRONCHOSCOPY WITHOUT FLUORO;  Surgeon: Lanier Clam, MD;  Location: [facility];  Service: Endoscopy;;    Social History   Socioeconomic History   Marital status: Married    Spouse name: Not on file   Number of children: Not on file   Years of education: Not on file   Highest education level: Not on file  Occupational History   Occupation: retired  Tobacco Use   Smoking status: Former    Packs/day: 1.00    Years: 24.00    Total pack years: 24.00    Types: Cigarettes    Quit date: 05/09/1998    Years since quitting: 24.0   Smokeless tobacco: Never  Vaping Use   Vaping Use: Never used  Substance and Sexual Activity   Alcohol use: Not  Currently    Comment: rarely    Drug use: No   Sexual activity: Not on file  Other Topics Concern   Not on file  Social History Narrative   Not on file   Social Determinants of Health   Financial Resource Strain: Low Risk  (03/29/2021)   Overall Financial Resource Strain (CARDIA)    Difficulty of Paying Living Expenses: Not hard at all  Food Insecurity: No Food Insecurity (03/20/2022)   Hunger Vital Sign    Worried About Running Out of Food in the Last Year: Never true    [place] in the Last Year: Never true  Transportation Needs: No Transportation Needs (03/20/2022)   PRAPARE - Hydrologist (Medical): No    Lack of Transportation (Non-Medical): No  Physical Activity: Insufficiently Active (03/29/2021)   Exercise Vital Sign    Days of Exercise per Week: 3 days    Minutes of Exercise per Session: 30 min  Stress: No Stress Concern Present (03/29/2021)   [place]    Feeling of Stress : Not at all  Social  Connections: Unknown (03/29/2021)   Social Connection and Isolation Panel [NHANES]    Frequency of Communication with Friends and Family: Not on file    Frequency of Social Gatherings with Friends and Family: Not on file    Attends Religious Services: Not on file    Active Member of Clubs or Organizations: Not on file    Attends Archivist Meetings: Not on file    Marital Status: Married  Intimate Partner Violence: Not At Risk (03/20/2022)   Humiliation, Afraid, Rape, and Kick questionnaire    Fear of Current or Ex-Partner: No    Emotionally Abused: No    Physically Abused: No    Sexually Abused: No    Family History  Problem Relation Age of Onset   Cancer Mother        OVARIAN   Stroke Father    Cancer Sister        breast   Breast cancer Sister 3   Atrial fibrillation Brother    Thyroid disease Other        MULTIPLE FAMILY MEMBERS     Current  Outpatient Medications:    acetaminophen (TYLENOL) 500 MG tablet, Take 1,000 mg by mouth every 6 (six) hours as needed for moderate pain., Disp: , Rfl:    albuterol (VENTOLIN HFA) 108 (90 Base) MCG/ACT inhaler, Inhale 2 puffs into the lungs every 6 (six) hours as needed for wheezing or shortness of breath., Disp: 8 g, Rfl: 2   ALPRAZolam (XANAX) 0.5 MG tablet, Take 1 tablet (0.5 mg total) by mouth 2 (two) times daily as needed for anxiety., Disp: 60 tablet, Rfl: 2   dexamethasone (DECADRON) 4 MG tablet, Take 1 tab 2 times daily starting day before pemetrexed. Then take 2 tabs daily x 3 days starting day after carboplatin. Take with food., Disp: 30 tablet, Rfl: 1   fenofibrate micronized (LOFIBRA) 134 MG capsule, Take 1 capsule (134 mg total) by mouth daily before breakfast., Disp: 30 capsule, Rfl: 11   folic acid (FOLVITE) 1 MG tablet, Take 1 tablet (1 mg total) by mouth daily. Start 7 days before pemetrexed chemotherapy. Continue until 21 days after pemetrexed completed., Disp: 100 tablet, Rfl: 3   levothyroxine (SYNTHROID) 75 MCG tablet, TAKE 1 TABLET EVERY DAY ON EMPTY STOMACHWITH A GLASS OF WATER AT LEAST 30-60 MINBEFORE BREAKFAST (Patient taking differently: Take 75 mcg by mouth daily before breakfast. On an empty stomach with a glass of water at least 30-60 minutes before breakfast.), Disp: 90 tablet, Rfl: 1   lidocaine-prilocaine (EMLA) cream, Apply to affected area once, Disp: 30 g, Rfl: 3   OLANZapine (ZYPREXA) 10 MG tablet, Take 1 tablet (10 mg total) by mouth at bedtime., Disp: 30 tablet, Rfl: 2   omeprazole (PRILOSEC) 20 MG capsule, TAKE 1 CAPSULE BY MOUTH DAILY (Patient taking differently: Take 20 mg by mouth daily.), Disp: 30 capsule, Rfl: 3   oxyCODONE (OXY IR/ROXICODONE) 5 MG immediate release tablet, Take 1-2 tablets (5-10 mg total) by mouth every 4 (four) hours as needed (pain)., Disp: 60 tablet, Rfl: 0   prednisoLONE acetate (PRED FORTE) 1 % ophthalmic suspension, Place 1 drop into the  right eye daily., Disp: , Rfl:    RESTASIS 0.05 % ophthalmic emulsion, Place 1 drop into both eyes 2 (two) times daily., Disp: , Rfl:    rosuvastatin (CRESTOR) 10 MG tablet, TAKE ONE TABLET EVERY DAY. (Patient taking differently: Take 10 mg by mouth daily.), Disp: 90 tablet, Rfl: 3   nitrofurantoin, macrocrystal-monohydrate, (  MACROBID) 100 MG capsule, Take 1 capsule (100 mg total) by mouth 2 (two) times daily. (Patient not taking: Reported on 05/28/2022), Disp: 10 capsule, Rfl: 0   ondansetron (ZOFRAN) 4 MG tablet, Take 1 tablet (4 mg total) by mouth every 6 (six) hours as needed for nausea. (Patient not taking: Reported on 04/03/2022), Disp: 30 tablet, Rfl: 1   ondansetron (ZOFRAN) 8 MG tablet, Take 1 tablet (8 mg total) by mouth every 8 (eight) hours as needed for nausea or vomiting. Start on the third day after carboplatin. (Patient not taking: Reported on 04/09/2022), Disp: 30 tablet, Rfl: 1   potassium chloride SA (KLOR-CON M) 20 MEQ tablet, Take 1 tablet (20 mEq total) by mouth daily., Disp: 14 tablet, Rfl: 0   prochlorperazine (COMPAZINE) 10 MG tablet, Take 1 tablet (10 mg total) by mouth every 6 (six) hours as needed for nausea or vomiting. (Patient not taking: Reported on 04/09/2022), Disp: 30 tablet, Rfl: 1   promethazine (PHENERGAN) 12.5 MG suppository, Place 1 suppository (12.5 mg total) rectally every 6 (six) hours as needed for nausea or vomiting. (Patient not taking: Reported on 04/09/2022), Disp: 12 each, Rfl: 0   promethazine (PHENERGAN) 12.5 MG tablet, Take 1 tablet (12.5 mg total) by mouth every 8 (eight) hours as needed for nausea or vomiting. (Patient not taking: Reported on 04/09/2022), Disp: 30 tablet, Rfl: 0   traZODone (DESYREL) 50 MG tablet, TAKE ONE TABLET AT BEDTIME AS NEEDED FORSLEEP. (Patient not taking: Reported on 04/09/2022), Disp: 90 tablet, Rfl: 1  Physical exam:  Vitals:   05/28/22 0914  BP: (!) 116/51  Pulse: 96  Resp: 16  Temp: (!) 97.2 F (36.2 C)  TempSrc:  Tympanic  SpO2: 97%  Weight: 163 lb (73.9 kg)  Height: _0  (1.549 m)   Physical Exam Constitutional:      Comments: Sitting in a wheelchair.  Appears fatigued  Cardiovascular:     Rate and Rhythm: Normal rate and regular rhythm.     Heart sounds: Normal heart sounds.  Pulmonary:     Breath sounds: Normal breath sounds.  Abdominal:     General: Bowel sounds are normal.     Palpations: Abdomen is soft.  Skin:    General: Skin is warm and dry.  Neurological:     Mental Status: She is alert and oriented to person, place, and time.         Latest Ref Rng & Units 05/28/2022    9:04 AM  CMP  Glucose 70 - 99 mg/dL 188   BUN 8 - 23 mg/dL 8   Creatinine 0.44 - 1.00 mg/dL 0.80   Sodium 135 - 145 mmol/L 139   Potassium 3.5 - 5.1 mmol/L 3.0   Chloride 98 - 111 mmol/L 106   CO2 22 - 32 mmol/L 24   Calcium 8.9 - 10.3 mg/dL 7.7   Total Protein 6.5 - 8.1 g/dL 6.5   Total Bilirubin 0.3 - 1.2 mg/dL 0.4   Alkaline Phos 38 - 126 U/L 74   AST 15 - 41 U/L 25   ALT 0 - 44 U/L 16       Latest Ref Rng & Units 05/28/2022    9:04 AM  CBC  WBC 4.0 - 10.5 K/uL 6.6   Hemoglobin 12.0 - 15.0 g/dL 10.1   Hematocrit 36.0 - 46.0 % 30.6   Platelets 150 - 400 K/uL 537      Assessment and plan- Patient is a 71 y.o. female with metastatic lung  adenocarcinoma here for on treatment assessment prior to cycle 3 of carbo Alimta Keytruda chemotherapy  Counts okay to proceed with cycle 3 of carbo Alimta Keytruda chemotherapy today.  I will see her back in 3 weeks for cycle 4.  Plan to repeat CT chest abdomen and pelvis with contrast after 4 cycles.  Bone metastases: Zometa will be on hold today due to hypocalcemia and corrected calcium being 8.7.  Neoplasm related pain: Continue as needed oxycodone but I will add fentanyl patch 12 micrograms to her regimen.  Lack of appetite: She is on Zyprexa which she will continue.Suspect this is secondary to her underlying malignancy.   Dyspnea:I will obtain a  chest x-ray at this time and refer her to pulmonary to see if her lungs can be optimized in any way while she is undergoing treatment for her lung cancer.  She will be seen for possible IV fluids in 1 week's time  Hypokalemia: Renew oral potassium and she will receive IV potassium today as well. Visit Diagnosis 1. Encounter for antineoplastic chemotherapy   2. Lung cancer metastatic to bone ([place])   3. Encounter for antineoplastic immunotherapy   4. Neoplasm related pain   5. Lack of appetite   6. Hypokalemia      Dr. Randa Evens, MD, MPH [facility] [phone] 05/28/2022 4:06 PM

## 2022-05-29 ENCOUNTER — Other Ambulatory Visit: Payer: Self-pay | Admitting: *Deleted

## 2022-05-29 ENCOUNTER — Encounter: Payer: Self-pay | Admitting: Oncology

## 2022-05-29 MED ORDER — MIRTAZAPINE 15 MG PO TABS: 15.0000 mg | ORAL_TABLET | Freq: Every day | ORAL | 1 refills | Status: AC

## 2022-05-29 NOTE — Telephone Encounter (Signed)
Phone call made to patient to inform that mirtazapine will be sent in this afternoon for her to start taking to help with appetite. Informed that pt needs to stop olanzapine (Zyprexa) due to interaction. Also informed for pt to start pain patch first then introduce mirtazpine 2-3 days later per Dr. Janese Banks. Pt was asleep when called but was able to speak with her daughter, Caryl Pina, and informed of all of the above. Nothing further needed at this time.

## 2022-05-30 ENCOUNTER — Other Ambulatory Visit: Payer: Self-pay | Admitting: Specialist

## 2022-05-30 DIAGNOSIS — J986 Disorders of diaphragm: Secondary | ICD-10-CM

## 2022-05-30 DIAGNOSIS — R0602 Shortness of breath: Secondary | ICD-10-CM

## 2022-06-04 ENCOUNTER — Ambulatory Visit
Admission: RE | Admit: 2022-06-04 | Discharge: 2022-06-04 | Disposition: A | Discharge status: Home / Self Care | Payer: PPO | Source: Ambulatory Visit | Attending: Specialist | Admitting: Specialist

## 2022-06-04 ENCOUNTER — Encounter: Payer: Self-pay | Admitting: Medical Oncology

## 2022-06-04 ENCOUNTER — Inpatient Hospital Stay: Payer: PPO

## 2022-06-04 ENCOUNTER — Inpatient Hospital Stay (HOSPITAL_BASED_OUTPATIENT_CLINIC_OR_DEPARTMENT_OTHER): Payer: PPO | Admitting: Medical Oncology

## 2022-06-04 VITALS — Wt 159.0 lb

## 2022-06-04 VITALS — BP 110/67 | HR 96 | Temp 97.7°F | Resp 19 | Wt 159.0 lb

## 2022-06-04 DIAGNOSIS — E876 Hypokalemia: Secondary | ICD-10-CM

## 2022-06-04 DIAGNOSIS — J986 Disorders of diaphragm: Secondary | ICD-10-CM | POA: Diagnosis present

## 2022-06-04 DIAGNOSIS — R770 Abnormality of albumin: Secondary | ICD-10-CM | POA: Diagnosis not present

## 2022-06-04 DIAGNOSIS — E860 Dehydration: Secondary | ICD-10-CM

## 2022-06-04 DIAGNOSIS — R0602 Shortness of breath: Secondary | ICD-10-CM | POA: Diagnosis present

## 2022-06-04 DIAGNOSIS — C7951 Secondary malignant neoplasm of bone: Secondary | ICD-10-CM

## 2022-06-04 DIAGNOSIS — Z5112 Encounter for antineoplastic immunotherapy: Secondary | ICD-10-CM | POA: Diagnosis not present

## 2022-06-04 LAB — BASIC METABOLIC PANEL
Anion gap: 8 (ref 5–15)
BUN: 16 mg/dL (ref 8–23)
CO2: 25 mmol/L (ref 22–32)
Calcium: 7.9 mg/dL — ABNORMAL LOW (ref 8.9–10.3)
Chloride: 108 mmol/L (ref 98–111)
Creatinine, Ser: 0.68 mg/dL (ref 0.44–1.00)
GFR, Estimated: >60 mL/min (ref >60)
Glucose, Bld: 131 mg/dL — ABNORMAL HIGH (ref 70–99)
Potassium: 3.7 mmol/L (ref 3.5–5.1)
Sodium: 141 mmol/L (ref 135–145)

## 2022-06-04 MED ORDER — HEPARIN SOD (PORK) LOCK FLUSH 100 UNIT/ML IV SOLN
500.0000 [IU] | Freq: Once | INTRAVENOUS | Status: AC
  Administered 2022-06-04: 500 [IU] via INTRAVENOUS
  Filled 2022-06-04: qty 5

## 2022-06-04 MED ORDER — SODIUM CHLORIDE 0.9 % IV SOLN
Freq: Once | INTRAVENOUS | Status: AC
  Filled 2022-06-04: qty 250

## 2022-06-04 MED ORDER — SODIUM CHLORIDE 0.9% FLUSH
10.0000 mL | Freq: Once | INTRAVENOUS | Status: AC
  Administered 2022-06-04: 10 mL via INTRAVENOUS
  Filled 2022-06-04: qty 10

## 2022-06-04 NOTE — Progress Notes (Signed)
Symptom Management [place] at [facility] [phone]) [phone] [phone]) [phone]  Patient Care Team: [name], MD as PCP - General (Internal Medicine) [name], MD (Internal Medicine) Bary Castilla, Forest Gleason, MD (General Surgery) Telford Nab, RN as Oncology Nurse Navigator   Name of the patient: Lorraine Mills  [id number]  12-Jun-1950   Date of visit: 06/04/22  Reason for Consult: Lorraine Mills is a 71 y.o. 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 This reaction is typical for her according to patient. After her last round of chemotherapy she received IVF which she reports greatly improved her symptoms. She is here today for consideration of these fluids along with potassium which was down at last visit. She was subsequently started on potassium oral supplementation following IV supplementation. She has been taking this as directed without problems. She reports that her appetite is still low but she is able to tolerate some fluids/food.   At her last visit she was prescribed a fentanyl pain patch which she reports she has not felt the need to take yet. Pain is intermittent and fairly well controlled on current PRN oxycodone.   In terms of her chronic SOB she states that this is stable. She was referred to pulmonology and has her first appointment today. She is also scheduled for a CT chest on 06/30/2022. No increase in SOB, no chest pain or hemoptysis.   Denies any neurologic complaints. Denies recent fevers or illnesses. Denies any easy bleeding or bruising. Denies chest pain. Denies any constipation, or diarrhea. Denies urinary complaints. Patient offers no further  specific complaints today.  Wt Readings from Last 3 Encounters:  06/04/22 159 lb (72.1 kg)  06/04/22 159 lb (72.1 kg)  05/28/22 163 lb (73.9 kg)    PAST MEDICAL HISTORY: Past Medical History:  Diagnosis Date   Anxiety    Arthritis    Depression 01/31/2016   Goiter    Hyperlipidemia    TGs   Hypothyroidism    Seasonal allergies     PAST SURGICAL HISTORY:  Past Surgical History:  Procedure Laterality Date   ABDOMINAL HYSTERECTOMY     supracervical    BILATERAL OOPHORECTOMY  2001   BENIGN TUMOR   BREAST BIOPSY Left 1985   cyst    BRONCHIAL NEEDLE ASPIRATION BIOPSY  03/25/2022   Procedure: BRONCHIAL NEEDLE ASPIRATION BIOPSIES;  Surgeon: Lanier Clam, MD;  Location: [facility];  Service: Endoscopy;;   CATARACT EXTRACTION Right    COLONOSCOPY  08/17/2009   COLONOSCOPY WITH PROPOFOL N/A 03/19/2016   Procedure: COLONOSCOPY WITH PROPOFOL;  Surgeon: Robert Bellow, MD;  Location: [facility];  Service: Endoscopy;  Laterality: N/A;   COLONOSCOPY WITH PROPOFOL N/A 05/15/2021   Procedure: COLONOSCOPY WITH PROPOFOL;  Surgeon: Robert Bellow, MD;  Location: [facility];  Service: Endoscopy;  Laterality: N/A;   CORNEAL TRANSPLANT Right    ENDOBRONCHIAL ULTRASOUND N/A 03/25/2022   Procedure: ENDOBRONCHIAL ULTRASOUND;  Surgeon: Lanier Clam, MD;  Location: [facility];  Service: Endoscopy;  Laterality: N/A;   EYE SURGERY     IR IMAGING GUIDED PORT INSERTION  03/26/2022   JOINT REPLACEMENT     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   Procedure: TOTAL HIP ARTHROPLASTY ANTERIOR APPROACH;  Surgeon: Hessie Knows,  MD;  Location: [facility];  Service: Orthopedics;  Laterality: Right;   VIDEO BRONCHOSCOPY  03/25/2022   Procedure: VIDEO BRONCHOSCOPY WITHOUT FLUORO;  Surgeon: Lanier Clam, MD;  Location: [facility];  Service: Endoscopy;;    HEMATOLOGY/ONCOLOGY HISTORY:  Oncology History  Lung cancer metastatic to bone [facility])  03/21/2022 Initial  Diagnosis   Lung cancer metastatic to bone ([place])   04/03/2022 Cancer Staging   Staging form: Lung, AJCC 8th Edition - Clinical stage from 04/03/2022: Stage IVB (cT1a, cN2, cM1c) - Signed by Sindy Guadeloupe, MD on 04/03/2022   04/09/2022 -  Chemotherapy   Patient is on Treatment Plan : LUNG NSCLC Pemetrexed + Carboplatin q21d x 4 Cycles       ALLERGIES:  is allergic to latex and levofloxacin.  MEDICATIONS:  Current Outpatient Medications  Medication Sig Dispense Refill   acetaminophen (TYLENOL) 500 MG tablet Take 1,000 mg by mouth every 6 (six) hours as needed for moderate pain.     albuterol (VENTOLIN HFA) 108 (90 Base) MCG/ACT inhaler Inhale 2 puffs into the lungs every 6 (six) hours as needed for wheezing or shortness of breath. 8 g 2   ALPRAZolam (XANAX) 0.5 MG tablet Take 1 tablet (0.5 mg total) by mouth 2 (two) times daily as needed for anxiety. 60 tablet 2   dexamethasone (DECADRON) 4 MG tablet Take 1 tab 2 times daily starting day before pemetrexed. Then take 2 tabs daily x 3 days starting day after carboplatin. Take with food. 30 tablet 1   fenofibrate micronized (LOFIBRA) 134 MG capsule Take 1 capsule (134 mg total) by mouth daily before breakfast. 30 capsule 11   fentaNYL (DURAGESIC) 12 MCG/HR Place 1 patch onto the skin every 3 (three) days. 10 patch 0   folic acid (FOLVITE) 1 MG tablet Take 1 tablet (1 mg total) by mouth daily. Start 7 days before pemetrexed chemotherapy. Continue until 21 days after pemetrexed completed. 100 tablet 3   levothyroxine (SYNTHROID) 75 MCG tablet TAKE 1 TABLET EVERY DAY ON EMPTY STOMACHWITH A GLASS OF WATER AT LEAST 30-60 MINBEFORE BREAKFAST (Patient taking differently: Take 75 mcg by mouth daily before breakfast. On an empty stomach with a glass of water at least 30-60 minutes before breakfast.) 90 tablet 1   lidocaine-prilocaine (EMLA) cream Apply to affected area once 30 g 3   mirtazapine (REMERON) 15 MG tablet Take 1 tablet (15 mg total) by mouth  at bedtime. 30 tablet 1   OLANZapine (ZYPREXA) 10 MG tablet Take 1 tablet (10 mg total) by mouth at bedtime. 30 tablet 2   omeprazole (PRILOSEC) 20 MG capsule TAKE 1 CAPSULE BY MOUTH DAILY (Patient taking differently: Take 20 mg by mouth daily.) 30 capsule 3   ondansetron (ZOFRAN) 8 MG tablet Take 1 tablet (8 mg total) by mouth every 8 (eight) hours as needed for nausea or vomiting. Start on the third day after carboplatin. 30 tablet 1   oxyCODONE (OXY IR/ROXICODONE) 5 MG immediate release tablet Take 1-2 tablets (5-10 mg total) by mouth every 4 (four) hours as needed (pain). 60 tablet 0   potassium chloride SA (KLOR-CON M) 20 MEQ tablet Take 1 tablet (20 mEq total) by mouth daily. 14 tablet 0   prednisoLONE acetate (PRED FORTE) 1 % ophthalmic suspension Place 1 drop into the right eye daily.     RESTASIS 0.05 % ophthalmic emulsion Place 1 drop into both eyes 2 (two) times daily.     rosuvastatin (CRESTOR) 10 MG tablet TAKE ONE TABLET  EVERY DAY. (Patient taking differently: Take 10 mg by mouth daily.) 90 tablet 3   nitrofurantoin, macrocrystal-monohydrate, (MACROBID) 100 MG capsule Take 1 capsule (100 mg total) by mouth 2 (two) times daily. (Patient not taking: Reported on 05/28/2022) 10 capsule 0   ondansetron (ZOFRAN) 4 MG tablet Take 1 tablet (4 mg total) by mouth every 6 (six) hours as needed for nausea. (Patient not taking: Reported on 04/03/2022) 30 tablet 1   prochlorperazine (COMPAZINE) 10 MG tablet Take 1 tablet (10 mg total) by mouth every 6 (six) hours as needed for nausea or vomiting. (Patient not taking: Reported on 04/09/2022) 30 tablet 1   promethazine (PHENERGAN) 12.5 MG suppository Place 1 suppository (12.5 mg total) rectally every 6 (six) hours as needed for nausea or vomiting. (Patient not taking: Reported on 04/09/2022) 12 each 0   promethazine (PHENERGAN) 12.5 MG tablet Take 1 tablet (12.5 mg total) by mouth every 8 (eight) hours as needed for nausea or vomiting. (Patient not taking:  Reported on 04/09/2022) 30 tablet 0   No current facility-administered medications for this visit.    VITAL SIGNS: BP 110/67 (BP Location: Right Arm, Patient Position: Sitting)   Pulse 96   Temp 97.7 F (36.5 C) (Tympanic)   Resp 19   Wt 159 lb (72.1 kg)   SpO2 97%   BMI 30.04 kg/m  Filed Weights   06/04/22 0954 06/04/22 1146  Weight: 159 lb (72.1 kg) 159 lb (72.1 kg)    Estimated body mass index is 30.04 kg/m as calculated from the following:   Height as of 05/28/22: 5\' 1"  (1.549 m).   Weight as of this encounter: 159 lb (72.1 kg).  LABS: CBC:    Component Value Date/Time   WBC 6.6 05/28/2022 0904   HGB 10.1 (L) 05/28/2022 0904   HCT 30.6 (L) 05/28/2022 0904   PLT 537 (H) 05/28/2022 0904   MCV 93.0 05/28/2022 0904   NEUTROABS 4.5 05/28/2022 0904   LYMPHSABS 0.9 05/28/2022 0904   MONOABS 0.9 05/28/2022 0904   EOSABS 0.0 05/28/2022 0904   BASOSABS 0.0 05/28/2022 0904   Comprehensive Metabolic Panel:    Component Value Date/Time   NA 141 06/04/2022 0929   NA 142 10/02/2020 0938   K 3.7 06/04/2022 0929   CL 108 06/04/2022 0929   CO2 25 06/04/2022 0929   BUN 16 06/04/2022 0929   BUN 22 10/02/2020 0938   CREATININE 0.68 06/04/2022 0929   CREATININE 0.95 01/29/2016 1429   GLUCOSE 131 (H) 06/04/2022 0929   CALCIUM 7.9 (L) 06/04/2022 0929   AST 25 05/28/2022 0904   ALT 16 05/28/2022 0904   ALKPHOS 74 05/28/2022 0904   BILITOT 0.4 05/28/2022 0904   PROT 6.5 05/28/2022 0904   ALBUMIN 2.7 (L) 05/28/2022 0904    RADIOGRAPHIC STUDIES: No results found.  PERFORMANCE STATUS (ECOG) : 1 - Symptomatic but completely ambulatory  Review of Systems Unless otherwise noted, a complete review of systems is negative.  Physical Exam General: NAD. Accompanied by her husband  Cardiovascular: regular rate and rhythm Pulmonary: clear ant fields Abdomen: soft, nontender, + bowel sounds GU: no suprapubic tenderness Extremities: no edema, no joint deformities Skin: no  rashes Neurological: Weakness but otherwise nonfocal  Assessment and Plan- Patient is a 71 y.o. female    Encounter Diagnoses  Name Primary?   Dehydration Yes   Hypokalemia    Hypocalcemia    Low serum albumin    Dehydration: Chronic following chemotherapy and decreased PO intake. Per patient  IVF help to make her feel better and do not cause and peripheral edema or worsened SOB. Planning on 1L IVF today.   Hypokalemia: Resolved on daily potassium supplementation. Will continue to monitor.   Hypocalcemia: Chronic. Previous corrected calcium is mildly low. Likely dietary in nature given concurrent low serum albumin, weight trends and loss of appetite however in the future may consider additional labs (PTH, Vit D, Mag).   Low Serum Albumin: Chronic in nature and likely secondary to low PO intake. Fluids have traditionally helped her feel better and improved her appetite which should subsequently help assist with this finding.   SOB: Chronic and stable. Seeing specialist today. CT chest scheduled on 06/30/2022  Neoplasm related pain: Chronic and stable. Has not started the Fentanyl patches but has them if needed.   Disposition: 1L IVF  RTC 2 weeks as planned with Dr. Janese Banks  Patient expressed understanding and was in agreement with this plan. She also understands that She can call clinic at any time with any questions, concerns, or complaints.   Thank you for allowing me to participate in the care of this very pleasant patient.   Time Total: 25  Visit consisted of counseling and education dealing with the complex and emotionally intense issues of symptom management in the setting of serious illness.Greater than 50%  of this time was spent counseling and coordinating care related to the above assessment and plan.  Signed by: Nelwyn Salisbury, PA-C

## 2022-06-11 ENCOUNTER — Other Ambulatory Visit: Payer: Self-pay | Admitting: Medical Oncology

## 2022-06-11 ENCOUNTER — Inpatient Hospital Stay: Payer: PPO

## 2022-06-11 ENCOUNTER — Inpatient Hospital Stay (HOSPITAL_BASED_OUTPATIENT_CLINIC_OR_DEPARTMENT_OTHER): Payer: PPO | Admitting: Medical Oncology

## 2022-06-11 VITALS — BP 125/66 | HR 107 | Temp 98.9°F | Resp 22

## 2022-06-11 DIAGNOSIS — C781 Secondary malignant neoplasm of mediastinum: Secondary | ICD-10-CM | POA: Insufficient documentation

## 2022-06-11 DIAGNOSIS — R770 Abnormality of albumin: Secondary | ICD-10-CM

## 2022-06-11 DIAGNOSIS — C7951 Secondary malignant neoplasm of bone: Secondary | ICD-10-CM | POA: Insufficient documentation

## 2022-06-11 DIAGNOSIS — D696 Thrombocytopenia, unspecified: Secondary | ICD-10-CM

## 2022-06-11 DIAGNOSIS — Z510 Encounter for antineoplastic radiation therapy: Secondary | ICD-10-CM | POA: Diagnosis not present

## 2022-06-11 DIAGNOSIS — C772 Secondary and unspecified malignant neoplasm of intra-abdominal lymph nodes: Secondary | ICD-10-CM | POA: Insufficient documentation

## 2022-06-11 DIAGNOSIS — Z95828 Presence of other vascular implants and grafts: Secondary | ICD-10-CM

## 2022-06-11 DIAGNOSIS — C7972 Secondary malignant neoplasm of left adrenal gland: Secondary | ICD-10-CM | POA: Insufficient documentation

## 2022-06-11 DIAGNOSIS — E860 Dehydration: Secondary | ICD-10-CM | POA: Insufficient documentation

## 2022-06-11 DIAGNOSIS — C3490 Malignant neoplasm of unspecified part of unspecified bronchus or lung: Secondary | ICD-10-CM | POA: Diagnosis not present

## 2022-06-11 DIAGNOSIS — C3491 Malignant neoplasm of unspecified part of right bronchus or lung: Secondary | ICD-10-CM | POA: Insufficient documentation

## 2022-06-11 DIAGNOSIS — E876 Hypokalemia: Secondary | ICD-10-CM

## 2022-06-11 DIAGNOSIS — R630 Anorexia: Secondary | ICD-10-CM

## 2022-06-11 DIAGNOSIS — C7971 Secondary malignant neoplasm of right adrenal gland: Secondary | ICD-10-CM | POA: Insufficient documentation

## 2022-06-11 LAB — CBC WITH DIFFERENTIAL/PLATELET
Abs Immature Granulocytes: 0.03 10*3/uL (ref 0.00–0.07)
Basophils Absolute: 0 10*3/uL (ref 0.0–0.1)
Basophils Relative: 0 %
Eosinophils Absolute: 0 10*3/uL (ref 0.0–0.5)
Eosinophils Relative: 0 %
HCT: 25.4 % — ABNORMAL LOW (ref 36.0–46.0)
Hemoglobin: 8.5 g/dL — ABNORMAL LOW (ref 12.0–15.0)
Immature Granulocytes: 1 %
Lymphocytes Relative: 22 %
Lymphs Abs: 0.8 10*3/uL (ref 0.7–4.0)
MCH: 30.5 pg (ref 26.0–34.0)
MCHC: 33.5 g/dL (ref 30.0–36.0)
MCV: 91 fL (ref 80.0–100.0)
Monocytes Absolute: 0.6 10*3/uL (ref 0.1–1.0)
Monocytes Relative: 16 %
Neutro Abs: 2.3 10*3/uL (ref 1.7–7.7)
Neutrophils Relative %: 61 %
Platelets: 137 10*3/uL — ABNORMAL LOW (ref 150–400)
RBC: 2.79 MIL/uL — ABNORMAL LOW (ref 3.87–5.11)
RDW: 14.8 % (ref 11.5–15.5)
WBC: 3.7 10*3/uL — ABNORMAL LOW (ref 4.0–10.5)
nRBC: 0.5 % — ABNORMAL HIGH (ref 0.0–0.2)

## 2022-06-11 LAB — COMPREHENSIVE METABOLIC PANEL
ALT: 23 U/L (ref 0–44)
AST: 23 U/L (ref 15–41)
Albumin: 3 g/dL — ABNORMAL LOW (ref 3.5–5.0)
Alkaline Phosphatase: 81 U/L (ref 38–126)
Anion gap: 13 (ref 5–15)
BUN: 14 mg/dL (ref 8–23)
CO2: 24 mmol/L (ref 22–32)
Calcium: 7.7 mg/dL — ABNORMAL LOW (ref 8.9–10.3)
Chloride: 102 mmol/L (ref 98–111)
Creatinine, Ser: 0.88 mg/dL (ref 0.44–1.00)
GFR, Estimated: >60 mL/min (ref >60)
Glucose, Bld: 115 mg/dL — ABNORMAL HIGH (ref 70–99)
Potassium: 3.1 mmol/L — ABNORMAL LOW (ref 3.5–5.1)
Sodium: 139 mmol/L (ref 135–145)
Total Bilirubin: 0.5 mg/dL (ref 0.3–1.2)
Total Protein: 7.1 g/dL (ref 6.5–8.1)

## 2022-06-11 MED ORDER — SODIUM CHLORIDE 0.9 % IV SOLN: 4.0000 mg | Freq: Once | INTRAVENOUS | Status: DC

## 2022-06-11 MED ORDER — ONDANSETRON HCL 4 MG/2ML IJ SOLN
4.0000 mg | Freq: Once | INTRAMUSCULAR | Status: AC
  Administered 2022-06-11: 4 mg via INTRAVENOUS
  Filled 2022-06-11: qty 2

## 2022-06-11 MED ORDER — POTASSIUM CHLORIDE IN NACL 20-0.9 MEQ/L-% IV SOLN
Freq: Once | INTRAVENOUS | Status: AC
  Filled 2022-06-11: qty 1000

## 2022-06-11 MED ORDER — DEXAMETHASONE SODIUM PHOSPHATE 10 MG/ML IJ SOLN
4.0000 mg | Freq: Once | INTRAMUSCULAR | Status: AC
  Administered 2022-06-11: 4 mg via INTRAVENOUS
  Filled 2022-06-11: qty 1

## 2022-06-11 MED ORDER — HEPARIN SOD (PORK) LOCK FLUSH 100 UNIT/ML IV SOLN
500.0000 [IU] | Freq: Once | INTRAVENOUS | Status: AC
  Administered 2022-06-11: 500 [IU] via INTRAVENOUS
  Filled 2022-06-11: qty 5

## 2022-06-11 NOTE — Progress Notes (Signed)
Symptom Management [place] at [facility] [phone] [phone]) [phone]  Patient Care Team: Lorraine Mc, MD as PCP - General (Internal Medicine) Lorraine Mc, MD (Internal Medicine) Lorraine Mills, Lorraine Gleason, MD (General Surgery) Lorraine Nab, RN as Oncology Nurse Navigator   Name of the patient: Lorraine Mills  [id number]  1951-01-30   Date of visit: 06/11/22  Reason for Consult: Lorraine Mills is a 72 y.o. 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 Decreased oral intake, fatigue. She received IVF/K at her last visit. She reports that she did not feel much better with her treatment from [facility]. Still having nausea, decreased appetite, decreased oral intake, fatigue. Taking her potassium supplement once daily. No diarrhea or significant vomiting. The only new thing is slightly worse SOB than baseline. She reports that she has noticed this for the past day. No chest pain, calf pain, leg swelling, fever, sputum production. Has seen pulmonology and was given two inhalers- albuterol and Trelegy which has helped per patient.    Denies any neurologic complaints. Denies recent fevers or illnesses. Denies any easy bleeding or bruising. Denies chest pain. Denies any constipation, or diarrhea. Denies urinary complaints. Patient offers no further specific complaints today.  Wt Readings from Last 3 Encounters:  06/04/22 159 lb (72.1 kg)  06/04/22 159 lb (72.1 kg)  05/28/22 163 lb (73.9 kg)    PAST MEDICAL HISTORY: Past Medical History:  Diagnosis Date   Anxiety    Arthritis    Depression 01/31/2016   Goiter    Hyperlipidemia    TGs   Hypothyroidism    Seasonal allergies     PAST SURGICAL HISTORY:  Past Surgical History:  Procedure Laterality Date    ABDOMINAL HYSTERECTOMY     supracervical    BILATERAL OOPHORECTOMY  2001   BENIGN TUMOR   BREAST BIOPSY Left 1985   cyst    BRONCHIAL NEEDLE ASPIRATION BIOPSY  03/25/2022   Procedure: BRONCHIAL NEEDLE ASPIRATION BIOPSIES;  Surgeon: Lorraine Clam, MD;  Location: [facility];  Service: Endoscopy;;   CATARACT EXTRACTION Right    COLONOSCOPY  08/17/2009   COLONOSCOPY WITH PROPOFOL N/A 03/19/2016   Procedure: COLONOSCOPY WITH PROPOFOL;  Surgeon: Lorraine Bellow, MD;  Location: [facility] ENDOSCOPY;  Service: Endoscopy;  Laterality: N/A;   COLONOSCOPY WITH PROPOFOL N/A 05/15/2021   Procedure: COLONOSCOPY WITH PROPOFOL;  Surgeon: Lorraine Bellow, MD;  Location: [facility] ENDOSCOPY;  Service: Endoscopy;  Laterality: N/A;   CORNEAL TRANSPLANT Right    ENDOBRONCHIAL ULTRASOUND N/A 03/25/2022   Procedure: ENDOBRONCHIAL ULTRASOUND;  Surgeon: Lorraine Clam, MD;  Location: [facility];  Service: Endoscopy;  Laterality: N/A;   EYE SURGERY     IR IMAGING GUIDED PORT INSERTION  03/26/2022   JOINT REPLACEMENT     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   Procedure: TOTAL HIP ARTHROPLASTY ANTERIOR APPROACH;  Surgeon: Lorraine Knows, MD;  Location: [facility] ORS;  Service: Orthopedics;  Laterality: Right;   VIDEO BRONCHOSCOPY  03/25/2022   Procedure: VIDEO BRONCHOSCOPY WITHOUT FLUORO;  Surgeon: Lorraine Clam, MD;  Location: [facility];  Service: Endoscopy;;    HEMATOLOGY/ONCOLOGY HISTORY:  Oncology History  Lung cancer metastatic to bone [facility])  03/21/2022 Initial Diagnosis   Lung cancer metastatic to bone ([place])   04/03/2022 Cancer Staging   Staging form: Lung, AJCC 8th  Edition - Clinical stage from 04/03/2022: Stage IVB (cT1a, cN2, cM1c) - Signed by Lorraine Guadeloupe, MD on 04/03/2022   04/09/2022 -  Chemotherapy   Patient is on Treatment Plan : LUNG NSCLC Pemetrexed + Carboplatin q21d x 4 Cycles       ALLERGIES:  is allergic to latex and levofloxacin.  MEDICATIONS:  Current  Outpatient Medications  Medication Sig Dispense Refill   acetaminophen (TYLENOL) 500 MG tablet Take 1,000 mg by mouth every 6 (six) hours as needed for moderate pain.     albuterol (VENTOLIN HFA) 108 (90 Base) MCG/ACT inhaler Inhale 2 puffs into the lungs every 6 (six) hours as needed for wheezing or shortness of breath. 8 g 2   ALPRAZolam (XANAX) 0.5 MG tablet Take 1 tablet (0.5 mg total) by mouth 2 (two) times daily as needed for anxiety. 60 tablet 2   dexamethasone (DECADRON) 4 MG tablet Take 1 tab 2 times daily starting day before pemetrexed. Then take 2 tabs daily x 3 days starting day after carboplatin. Take with food. 30 tablet 1   fenofibrate micronized (LOFIBRA) 134 MG capsule Take 1 capsule (134 mg total) by mouth daily before breakfast. 30 capsule 11   fentaNYL (DURAGESIC) 12 MCG/HR Place 1 patch onto the skin every 3 (three) days. 10 patch 0   folic acid (FOLVITE) 1 MG tablet Take 1 tablet (1 mg total) by mouth daily. Start 7 days before pemetrexed chemotherapy. Continue until 21 days after pemetrexed completed. 100 tablet 3   levothyroxine (SYNTHROID) 75 MCG tablet TAKE 1 TABLET EVERY DAY ON EMPTY STOMACHWITH A GLASS OF WATER AT LEAST 30-60 MINBEFORE BREAKFAST (Patient taking differently: Take 75 mcg by mouth daily before breakfast. On an empty stomach with a glass of water at least 30-60 minutes before breakfast.) 90 tablet 1   lidocaine-prilocaine (EMLA) cream Apply to affected area once 30 g 3   mirtazapine (REMERON) 15 MG tablet Take 1 tablet (15 mg total) by mouth at bedtime. 30 tablet 1   nitrofurantoin, macrocrystal-monohydrate, (MACROBID) 100 MG capsule Take 1 capsule (100 mg total) by mouth 2 (two) times daily. (Patient not taking: Reported on 05/28/2022) 10 capsule 0   OLANZapine (ZYPREXA) 10 MG tablet Take 1 tablet (10 mg total) by mouth at bedtime. 30 tablet 2   omeprazole (PRILOSEC) 20 MG capsule TAKE 1 CAPSULE BY MOUTH DAILY (Patient taking differently: Take 20 mg by mouth  daily.) 30 capsule 3   ondansetron (ZOFRAN) 4 MG tablet Take 1 tablet (4 mg total) by mouth every 6 (six) hours as needed for nausea. (Patient not taking: Reported on 04/03/2022) 30 tablet 1   ondansetron (ZOFRAN) 8 MG tablet Take 1 tablet (8 mg total) by mouth every 8 (eight) hours as needed for nausea or vomiting. Start on the third day after carboplatin. 30 tablet 1   oxyCODONE (OXY IR/ROXICODONE) 5 MG immediate release tablet Take 1-2 tablets (5-10 mg total) by mouth every 4 (four) hours as needed (pain). 60 tablet 0   potassium chloride SA (KLOR-CON M) 20 MEQ tablet Take 1 tablet (20 mEq total) by mouth daily. 14 tablet 0   prednisoLONE acetate (PRED FORTE) 1 % ophthalmic suspension Place 1 drop into the right eye daily.     prochlorperazine (COMPAZINE) 10 MG tablet Take 1 tablet (10 mg total) by mouth every 6 (six) hours as needed for nausea or vomiting. (Patient not taking: Reported on 04/09/2022) 30 tablet 1   promethazine (PHENERGAN) 12.5 MG suppository Place 1 suppository (  12.5 mg total) rectally every 6 (six) hours as needed for nausea or vomiting. (Patient not taking: Reported on 04/09/2022) 12 each 0   promethazine (PHENERGAN) 12.5 MG tablet Take 1 tablet (12.5 mg total) by mouth every 8 (eight) hours as needed for nausea or vomiting. (Patient not taking: Reported on 04/09/2022) 30 tablet 0   RESTASIS 0.05 % ophthalmic emulsion Place 1 drop into both eyes 2 (two) times daily.     rosuvastatin (CRESTOR) 10 MG tablet TAKE ONE TABLET EVERY DAY. (Patient taking differently: Take 10 mg by mouth daily.) 90 tablet 3   No current facility-administered medications for this visit.    VITAL SIGNS: BP 125/66 (BP Location: Left Arm, Patient Position: Sitting)   Pulse (!) 107   Temp 98.9 F (37.2 C) (Tympanic)   Resp (!) 22  There were no vitals filed for this visit.   Estimated body mass index is 30.04 kg/m as calculated from the following:   Height as of 05/28/22: 5\' 1"  (1.549 m).   Weight as  of 06/04/22: 159 lb (72.1 kg).  LABS: CBC:    Component Value Date/Time   WBC 3.7 (L) 06/11/2022 1328   HGB 8.5 (L) 06/11/2022 1328   HCT 25.4 (L) 06/11/2022 1328   PLT 137 (L) 06/11/2022 1328   MCV 91.0 06/11/2022 1328   NEUTROABS 2.3 06/11/2022 1328   LYMPHSABS 0.8 06/11/2022 1328   MONOABS 0.6 06/11/2022 1328   EOSABS 0.0 06/11/2022 1328   BASOSABS 0.0 06/11/2022 1328   Comprehensive Metabolic Panel:    Component Value Date/Time   NA 139 06/11/2022 1328   NA 142 10/02/2020 0938   K 3.1 (L) 06/11/2022 1328   CL 102 06/11/2022 1328   CO2 24 06/11/2022 1328   BUN 14 06/11/2022 1328   BUN 22 10/02/2020 0938   CREATININE 0.88 06/11/2022 1328   CREATININE 0.95 01/29/2016 1429   GLUCOSE 115 (H) 06/11/2022 1328   CALCIUM 7.7 (L) 06/11/2022 1328   AST 23 06/11/2022 1328   ALT 23 06/11/2022 1328   ALKPHOS 81 06/11/2022 1328   BILITOT 0.5 06/11/2022 1328   PROT 7.1 06/11/2022 1328   ALBUMIN 3.0 (L) 06/11/2022 1328    RADIOGRAPHIC STUDIES: DG Sniff Test  Result Date: 06/04/2022 CLINICAL DATA:  Shortness of breath, right lung cancer EXAM: CHEST FLUOROSCOPY TECHNIQUE: Real-time fluoroscopic evaluation of the chest was performed. FLUOROSCOPY: Radiation Exposure Index (as provided by the fluoroscopic device): 2.9 mGy COMPARISON:  CT chest 03/20/2022 FINDINGS: Real-time fluoroscopy of the diaphragms was performed during inspiration, expiration and sniffing. Elevation of the right diaphragm. Right basilar atelectasis. No significant diaphragmatic motion during inspiration, expiration or sniffing. Normal left diaphragmatic motion during inspiration, expiration and sniffing. IMPRESSION: 1. No significant right diaphragmatic motion during inspiration, expiration or sniffing consistent with right diaphragmatic paralysis. Electronically Signed   By: Kathreen Devoid M.D.   On: 06/04/2022 12:38    PERFORMANCE STATUS (ECOG) : 1 - Symptomatic but completely ambulatory  Review of Systems Unless  otherwise noted, a complete review of systems is negative.  Physical Exam General: NAD. Accompanied by her husband- eating slowly on cracker  Cardiovascular: regular rate and rhythm Pulmonary: clear ant fields Abdomen: soft, nontender, + bowel sounds GU: no suprapubic tenderness Extremities: no edema, no joint deformities Skin: no rashes Neurological: Weakness but otherwise nonfocal  Assessment and Plan- Patient is a 72 y.o. female    Encounter Diagnoses  Name Primary?   Hypokalemia Yes   Hypocalcemia    Lung cancer  metastatic to bone (HCC)    Dehydration    Lack of appetite    Thrombocytopenia (HCC)     Dehydration: Chronic following chemotherapy and decreased PO intake.IVF today which she has tolerated well in the past. Today she still is feeling poorly. Suspect she may improve with the addition of decadron and zofran along with potassium.   Hypokalemia: Previously resolved on PO supplementation. Potassium today is  3.1. Supplementing with 20 MEQ K. Continue home potassium.   Hypocalcemia: Chronic. Corrected calcium is 8.1. Likely dietary in nature given concurrent low serum albumin, weight trends and loss of appetite however for completeness will check (PTH, Vit D, Mag) at her next set of labs.   Low Serum Albumin: Chronic in nature and likely secondary to low PO intake. Fluids have traditionally helped her feel better and improved her appetite which should subsequently help assist with this finding. Today we have planned to give supportive IVF.   SOB: Slightly acute on chronic. Trelegy is helping some. Question if decline in hemoglobin may be contributing some. A bit tachypnic on vitals. Pt does not appear hypoxia or in distress. Speaking in full sentences. No edema or calf/chest pain of concern. No fever. Will trial 4 mg dexamethosone today to see if this offers some relief; if not CT PE protocol would be suggested to rule out PE, pneumonitis.   Anemia/Thrombocytopenia: Appears  to be secondary to chemotherapy. Afebrile. Fairly steep decline between 1 month, 2 weeks and now. No known bleeding episodes. WBC also down but ANC is normal range. Rechecking on Friday.   Neoplasm related pain: Chronic and stable. Not taking the Fentanyl yet   Disposition: 1L IVF, 4 mg decadron, 4 mg zofran, 20 MEQ K RTC Friday or Monday [facility], port labs, +- IVF/[place]  Patient expressed understanding and was in agreement with this plan. She also understands that She can call clinic at any time with any questions, concerns, or complaints.   Thank you for allowing me to participate in the care of this very pleasant patient.   Time Total: 25  Visit consisted of counseling and education dealing with the complex and emotionally intense issues of symptom management in the setting of serious illness.Greater than 50%  of this time was spent counseling and coordinating care related to the above assessment and plan.  Signed by: Nelwyn Salisbury, PA-C

## 2022-06-11 NOTE — Patient Instructions (Signed)
[facility] AT [place]  Discharge Instructions: Thank you for choosing [facility] to provide your oncology and hematology care.  If you have a lab appointment with the [facility], please go directly to the [facility] and check in at the registration area.  Wear comfortable clothing and clothing appropriate for easy access to any Portacath or PICC line.   We strive to give you quality time with your provider. You may need to reschedule your appointment if you arrive late (15 or more minutes).  Arriving late affects you and other patients whose appointments are after yours.  Also, if you miss three or more appointments without notifying the office, you may be dismissed from the clinic at the provider's discretion.      For prescription refill requests, have your pharmacy contact our office and allow 72 hours for refills to be completed.    Today you received the following chemotherapy and/or immunotherapy agents FLUIDS, DECADRON, ZOFRAN      To help prevent nausea and vomiting after your treatment, we encourage you to take your nausea medication as directed.  BELOW ARE SYMPTOMS THAT SHOULD BE REPORTED IMMEDIATELY: *FEVER GREATER THAN 100.4 F (38 C) OR HIGHER *CHILLS OR SWEATING *NAUSEA AND VOMITING THAT IS NOT CONTROLLED WITH YOUR NAUSEA MEDICATION *UNUSUAL SHORTNESS OF BREATH *UNUSUAL BRUISING OR BLEEDING *URINARY PROBLEMS (pain or burning when urinating, or frequent urination) *BOWEL PROBLEMS (unusual diarrhea, constipation, pain near the anus) TENDERNESS IN MOUTH AND THROAT WITH OR WITHOUT PRESENCE OF ULCERS (sore throat, sores in mouth, or a toothache) UNUSUAL RASH, SWELLING OR PAIN  UNUSUAL VAGINAL DISCHARGE OR ITCHING   Items with * indicate a potential emergency and should be followed up as soon as possible or go to the Emergency Department if any problems should occur.  Please show the CHEMOTHERAPY ALERT CARD or IMMUNOTHERAPY ALERT CARD  at check-in to the Emergency Department and triage nurse.  Should you have questions after your visit or need to cancel or reschedule your appointment, please contact [facility] [place] AT [place]  [phone] and follow the prompts.  Office hours are 8:00 a.m. to 4:30 p.m. Monday - Friday. Please note that voicemails left after 4:00 p.m. may not be returned until the following business day.  We are closed weekends and major holidays. You have access to a nurse at all times for urgent questions. Please call the main number to the clinic [phone] and follow the prompts.  For any non-urgent questions, you may also contact your provider using MyChart. We now offer e-Visits for anyone 71 and older to request care online for non-urgent symptoms. For details visit [email or web address].   Also download the MyChart app! Go to the app store, search "MyChart", open the app, select [place], and log in with your MyChart username and password.

## 2022-06-16 ENCOUNTER — Encounter: Payer: Self-pay | Admitting: Hospice and Palliative Medicine

## 2022-06-16 ENCOUNTER — Inpatient Hospital Stay: Payer: PPO

## 2022-06-16 ENCOUNTER — Inpatient Hospital Stay (HOSPITAL_BASED_OUTPATIENT_CLINIC_OR_DEPARTMENT_OTHER): Payer: PPO | Admitting: Hospice and Palliative Medicine

## 2022-06-16 ENCOUNTER — Other Ambulatory Visit: Payer: Self-pay

## 2022-06-16 VITALS — BP 123/66 | HR 110 | Ht 61.0 in | Wt 159.6 lb

## 2022-06-16 DIAGNOSIS — R112 Nausea with vomiting, unspecified: Secondary | ICD-10-CM

## 2022-06-16 DIAGNOSIS — E876 Hypokalemia: Secondary | ICD-10-CM

## 2022-06-16 DIAGNOSIS — Z95828 Presence of other vascular implants and grafts: Secondary | ICD-10-CM

## 2022-06-16 DIAGNOSIS — E860 Dehydration: Secondary | ICD-10-CM

## 2022-06-16 DIAGNOSIS — Z510 Encounter for antineoplastic radiation therapy: Secondary | ICD-10-CM | POA: Diagnosis not present

## 2022-06-16 DIAGNOSIS — J449 Chronic obstructive pulmonary disease, unspecified: Secondary | ICD-10-CM | POA: Diagnosis not present

## 2022-06-16 LAB — CBC WITH DIFFERENTIAL/PLATELET
Abs Immature Granulocytes: 0.11 10*3/uL — ABNORMAL HIGH (ref 0.00–0.07)
Basophils Absolute: 0 10*3/uL (ref 0.0–0.1)
Basophils Relative: 1 %
Eosinophils Absolute: 0 10*3/uL (ref 0.0–0.5)
Eosinophils Relative: 0 %
HCT: 27.2 % — ABNORMAL LOW (ref 36.0–46.0)
Hemoglobin: 8.6 g/dL — ABNORMAL LOW (ref 12.0–15.0)
Immature Granulocytes: 3 %
Lymphocytes Relative: 26 %
Lymphs Abs: 1 10*3/uL (ref 0.7–4.0)
MCH: 29.8 pg (ref 26.0–34.0)
MCHC: 31.6 g/dL (ref 30.0–36.0)
MCV: 94.1 fL (ref 80.0–100.0)
Monocytes Absolute: 1.7 10*3/uL — ABNORMAL HIGH (ref 0.1–1.0)
Monocytes Relative: 47 %
Neutro Abs: 0.8 10*3/uL — ABNORMAL LOW (ref 1.7–7.7)
Neutrophils Relative %: 23 %
Platelets: 518 10*3/uL — ABNORMAL HIGH (ref 150–400)
RBC: 2.89 MIL/uL — ABNORMAL LOW (ref 3.87–5.11)
RDW: 16.9 % — ABNORMAL HIGH (ref 11.5–15.5)
WBC: 3.6 10*3/uL — ABNORMAL LOW (ref 4.0–10.5)
nRBC: 0 % (ref 0.0–0.2)

## 2022-06-16 LAB — COMPREHENSIVE METABOLIC PANEL
ALT: 13 U/L (ref 0–44)
AST: 18 U/L (ref 15–41)
Albumin: 2.8 g/dL — ABNORMAL LOW (ref 3.5–5.0)
Alkaline Phosphatase: 95 U/L (ref 38–126)
Anion gap: 11 (ref 5–15)
BUN: 14 mg/dL (ref 8–23)
CO2: 25 mmol/L (ref 22–32)
Calcium: 8.3 mg/dL — ABNORMAL LOW (ref 8.9–10.3)
Chloride: 100 mmol/L (ref 98–111)
Creatinine, Ser: 1.29 mg/dL — ABNORMAL HIGH (ref 0.44–1.00)
GFR, Estimated: 44 mL/min — ABNORMAL LOW (ref >60)
Glucose, Bld: 117 mg/dL — ABNORMAL HIGH (ref 70–99)
Potassium: 4.1 mmol/L (ref 3.5–5.1)
Sodium: 136 mmol/L (ref 135–145)
Total Bilirubin: 0.5 mg/dL (ref 0.3–1.2)
Total Protein: 6.5 g/dL (ref 6.5–8.1)

## 2022-06-16 LAB — MAGNESIUM: Magnesium: 2.1 mg/dL (ref 1.7–2.4)

## 2022-06-16 MED ORDER — ONDANSETRON HCL 4 MG/2ML IJ SOLN
8.0000 mg | Freq: Once | INTRAMUSCULAR | Status: AC
  Administered 2022-06-16: 8 mg via INTRAVENOUS

## 2022-06-16 MED ORDER — SODIUM CHLORIDE 0.9 % IV SOLN
INTRAVENOUS | Status: DC
  Filled 2022-06-16 (×2): qty 250

## 2022-06-16 MED ORDER — SODIUM CHLORIDE 0.9% FLUSH
10.0000 mL | Freq: Once | INTRAVENOUS | Status: AC
  Administered 2022-06-16: 10 mL via INTRAVENOUS
  Filled 2022-06-16: qty 10

## 2022-06-16 MED ORDER — HEPARIN SOD (PORK) LOCK FLUSH 100 UNIT/ML IV SOLN
500.0000 [IU] | Freq: Once | INTRAVENOUS | Status: AC
  Administered 2022-06-16: 500 [IU]
  Filled 2022-06-16: qty 5

## 2022-06-16 MED ORDER — ONDANSETRON HCL 4 MG/2ML IJ SOLN
INTRAMUSCULAR | Status: AC
  Filled 2022-06-16: qty 4

## 2022-06-16 NOTE — Progress Notes (Signed)
Symptom Management [place] at [facility] [phone] [phone]) [phone]  Patient Care Team: [name], MD as PCP - General (Internal Medicine) [name], MD (Internal Medicine) Bary Castilla, Forest Gleason, MD (General Surgery) Telford Nab, RN as Oncology Nurse Navigator   NAME OF PATIENT: Lorraine Mills  [id number]  04-Sep-1950   DATE OF VISIT: 06/16/22  REASON FOR CONSULT: Lorraine Mills is a 72 y.o. 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  Patient is on systemic chemo with [place] Alimta and Keytruda.  INTERVAL HISTORY: Patient last saw Dr. Janese Banks on 05/28/2022 and follow-up by Nelwyn Salisbury, PA-C on 06/04/2022 and 06/11/2022.  Patient has had ongoing exertional dyspnea since time of diagnosis.  Patient is also had ongoing weakness, poor oral intake, and neoplasm related pain.  She was started recently on home O2.  Today, patient presents [facility] with continued poor oral intake, weakness, exertional dyspnea.  She has been taking Trelegy and nebulizers at home.  She was recently started on home O2, which she finds somewhat helpful.  She denies fever or chills.  No new symptomatic complaints  Denies any neurologic complaints. Denies recent fevers or illnesses. Denies any easy bleeding or bruising. Reports poor appetite. Denies chest pain. Denies any vomiting, constipation, or diarrhea. Denies urinary complaints. Patient offers no further specific complaints today.  PAST MEDICAL HISTORY: Past Medical History:  Diagnosis Date   Anxiety    Arthritis    Depression 01/31/2016   Goiter    Hyperlipidemia    TGs   Hypothyroidism    Seasonal allergies     PAST SURGICAL HISTORY:  Past Surgical History:  Procedure Laterality Date   ABDOMINAL HYSTERECTOMY     supracervical    BILATERAL OOPHORECTOMY  2001   BENIGN TUMOR   BREAST BIOPSY Left  1985   cyst    BRONCHIAL NEEDLE ASPIRATION BIOPSY  03/25/2022   Procedure: BRONCHIAL NEEDLE ASPIRATION BIOPSIES;  Surgeon: Lanier Clam, MD;  Location: [facility];  Service: Endoscopy;;   CATARACT EXTRACTION Right    COLONOSCOPY  08/17/2009   COLONOSCOPY WITH PROPOFOL N/A 03/19/2016   Procedure: COLONOSCOPY WITH PROPOFOL;  Surgeon: Robert Bellow, MD;  Location: [facility];  Service: Endoscopy;  Laterality: N/A;   COLONOSCOPY WITH PROPOFOL N/A 05/15/2021   Procedure: COLONOSCOPY WITH PROPOFOL;  Surgeon: Robert Bellow, MD;  Location: [facility];  Service: Endoscopy;  Laterality: N/A;   CORNEAL TRANSPLANT Right    ENDOBRONCHIAL ULTRASOUND N/A 03/25/2022   Procedure: ENDOBRONCHIAL ULTRASOUND;  Surgeon: Lanier Clam, MD;  Location: [facility];  Service: Endoscopy;  Laterality: N/A;   EYE SURGERY     IR IMAGING GUIDED PORT INSERTION  03/26/2022   JOINT REPLACEMENT     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   Procedure: TOTAL HIP ARTHROPLASTY ANTERIOR APPROACH;  Surgeon: Hessie Knows, MD;  Location: [facility] ORS;  Service: Orthopedics;  Laterality: Right;   VIDEO BRONCHOSCOPY  03/25/2022   Procedure: VIDEO BRONCHOSCOPY WITHOUT FLUORO;  Surgeon: Lanier Clam, MD;  Location: [facility];  Service: Endoscopy;;    HEMATOLOGY/ONCOLOGY HISTORY:  Oncology History  Lung cancer metastatic to bone [facility])  03/21/2022 Initial Diagnosis   Lung cancer metastatic to bone ([place])   04/03/2022 Cancer Staging   Staging form: Lung, AJCC 8th Edition - Clinical stage from 04/03/2022: Stage IVB (cT1a, cN2, cM1c) - Signed by Sindy Guadeloupe, MD on 04/03/2022  04/09/2022 -  Chemotherapy   Patient is on Treatment Plan : LUNG NSCLC Pemetrexed + Carboplatin q21d x 4 Cycles       ALLERGIES:  is allergic to latex and levofloxacin.  MEDICATIONS:  Current Outpatient Medications  Medication Sig Dispense Refill   acetaminophen (TYLENOL) 500 MG tablet Take 1,000 mg by mouth  every 6 (six) hours as needed for moderate pain.     albuterol (VENTOLIN HFA) 108 (90 Base) MCG/ACT inhaler Inhale 2 puffs into the lungs every 6 (six) hours as needed for wheezing or shortness of breath. 8 g 2   ALPRAZolam (XANAX) 0.5 MG tablet Take 1 tablet (0.5 mg total) by mouth 2 (two) times daily as needed for anxiety. 60 tablet 2   dexamethasone (DECADRON) 4 MG tablet Take 1 tab 2 times daily starting day before pemetrexed. Then take 2 tabs daily x 3 days starting day after carboplatin. Take with food. 30 tablet 1   fenofibrate micronized (LOFIBRA) 134 MG capsule Take 1 capsule (134 mg total) by mouth daily before breakfast. 30 capsule 11   fentaNYL (DURAGESIC) 12 MCG/HR Place 1 patch onto the skin every 3 (three) days. 10 patch 0   folic acid (FOLVITE) 1 MG tablet Take 1 tablet (1 mg total) by mouth daily. Start 7 days before pemetrexed chemotherapy. Continue until 21 days after pemetrexed completed. 100 tablet 3   levothyroxine (SYNTHROID) 75 MCG tablet TAKE 1 TABLET EVERY DAY ON EMPTY STOMACHWITH A GLASS OF WATER AT LEAST 30-60 MINBEFORE BREAKFAST (Patient taking differently: Take 75 mcg by mouth daily before breakfast. On an empty stomach with a glass of water at least 30-60 minutes before breakfast.) 90 tablet 1   lidocaine-prilocaine (EMLA) cream Apply to affected area once 30 g 3   mirtazapine (REMERON) 15 MG tablet Take 1 tablet (15 mg total) by mouth at bedtime. 30 tablet 1   nitrofurantoin, macrocrystal-monohydrate, (MACROBID) 100 MG capsule Take 1 capsule (100 mg total) by mouth 2 (two) times daily. (Patient not taking: Reported on 05/28/2022) 10 capsule 0   OLANZapine (ZYPREXA) 10 MG tablet Take 1 tablet (10 mg total) by mouth at bedtime. 30 tablet 2   omeprazole (PRILOSEC) 20 MG capsule TAKE 1 CAPSULE BY MOUTH DAILY (Patient taking differently: Take 20 mg by mouth daily.) 30 capsule 3   ondansetron (ZOFRAN) 4 MG tablet Take 1 tablet (4 mg total) by mouth every 6 (six) hours as needed  for nausea. (Patient not taking: Reported on 04/03/2022) 30 tablet 1   ondansetron (ZOFRAN) 8 MG tablet Take 1 tablet (8 mg total) by mouth every 8 (eight) hours as needed for nausea or vomiting. Start on the third day after carboplatin. 30 tablet 1   oxyCODONE (OXY IR/ROXICODONE) 5 MG immediate release tablet Take 1-2 tablets (5-10 mg total) by mouth every 4 (four) hours as needed (pain). 60 tablet 0   potassium chloride SA (KLOR-CON M) 20 MEQ tablet Take 1 tablet (20 mEq total) by mouth daily. 14 tablet 0   prednisoLONE acetate (PRED FORTE) 1 % ophthalmic suspension Place 1 drop into the right eye daily.     prochlorperazine (COMPAZINE) 10 MG tablet Take 1 tablet (10 mg total) by mouth every 6 (six) hours as needed for nausea or vomiting. (Patient not taking: Reported on 04/09/2022) 30 tablet 1   promethazine (PHENERGAN) 12.5 MG suppository Place 1 suppository (12.5 mg total) rectally every 6 (six) hours as needed for nausea or vomiting. (Patient not taking: Reported on 04/09/2022) 12 each  0   promethazine (PHENERGAN) 12.5 MG tablet Take 1 tablet (12.5 mg total) by mouth every 8 (eight) hours as needed for nausea or vomiting. (Patient not taking: Reported on 04/09/2022) 30 tablet 0   RESTASIS 0.05 % ophthalmic emulsion Place 1 drop into both eyes 2 (two) times daily.     rosuvastatin (CRESTOR) 10 MG tablet TAKE ONE TABLET EVERY DAY. (Patient taking differently: Take 10 mg by mouth daily.) 90 tablet 3   No current facility-administered medications for this visit.    VITAL SIGNS: BP 123/66   Pulse (!) 110   Ht 5\' 1"  (1.549 m)   Wt 159 lb 9.6 oz (72.4 kg)   SpO2 96% Comment: room air  BMI 30.16 kg/m  Filed Weights   06/16/22 1334  Weight: 159 lb 9.6 oz (72.4 kg)    Estimated body mass index is 30.16 kg/m as calculated from the following:   Height as of this encounter: 5\' 1"  (1.549 m).   Weight as of this encounter: 159 lb 9.6 oz (72.4 kg).  LABS: CBC:    Component Value Date/Time   WBC  3.7 (L) 06/11/2022 1328   HGB 8.5 (L) 06/11/2022 1328   HCT 25.4 (L) 06/11/2022 1328   PLT 137 (L) 06/11/2022 1328   MCV 91.0 06/11/2022 1328   NEUTROABS 2.3 06/11/2022 1328   LYMPHSABS 0.8 06/11/2022 1328   MONOABS 0.6 06/11/2022 1328   EOSABS 0.0 06/11/2022 1328   BASOSABS 0.0 06/11/2022 1328   Comprehensive Metabolic Panel:    Component Value Date/Time   NA 139 06/11/2022 1328   NA 142 10/02/2020 0938   K 3.1 (L) 06/11/2022 1328   CL 102 06/11/2022 1328   CO2 24 06/11/2022 1328   BUN 14 06/11/2022 1328   BUN 22 10/02/2020 0938   CREATININE 0.88 06/11/2022 1328   CREATININE 0.95 01/29/2016 1429   GLUCOSE 115 (H) 06/11/2022 1328   CALCIUM 7.7 (L) 06/11/2022 1328   AST 23 06/11/2022 1328   ALT 23 06/11/2022 1328   ALKPHOS 81 06/11/2022 1328   BILITOT 0.5 06/11/2022 1328   PROT 7.1 06/11/2022 1328   ALBUMIN 3.0 (L) 06/11/2022 1328    RADIOGRAPHIC STUDIES: DG Sniff Test  Result Date: 06/04/2022 CLINICAL DATA:  Shortness of breath, right lung cancer EXAM: CHEST FLUOROSCOPY TECHNIQUE: Real-time fluoroscopic evaluation of the chest was performed. FLUOROSCOPY: Radiation Exposure Index (as provided by the fluoroscopic device): 2.9 mGy COMPARISON:  CT chest 03/20/2022 FINDINGS: Real-time fluoroscopy of the diaphragms was performed during inspiration, expiration and sniffing. Elevation of the right diaphragm. Right basilar atelectasis. No significant diaphragmatic motion during inspiration, expiration or sniffing. Normal left diaphragmatic motion during inspiration, expiration and sniffing. IMPRESSION: 1. No significant right diaphragmatic motion during inspiration, expiration or sniffing consistent with right diaphragmatic paralysis. Electronically Signed   By: Kathreen Devoid M.D.   On: 06/04/2022 12:38    PERFORMANCE STATUS (ECOG) : 2 - Symptomatic, <50% confined to bed  Review of Systems Unless otherwise noted, a complete review of systems is negative.  Physical Exam General:  NAD Cardiovascular: regular rate and rhythm Pulmonary: clear ant fields, diminished right lung Abdomen: soft, nontender, + bowel sounds GU: no suprapubic tenderness Extremities: no edema, no joint deformities Skin: no rashes Neurological: Weakness but otherwise nonfocal  IMPRESSION/PLAN: Shortness of breath-this appears to be a chronic issue but most likely secondary to her underlying cancer.  Patient is pending CTs scheduled later this month.  Will see if we can obtain these sooner.  Poor oral intake -patient appears clinically dehydrated today.  Proceed with IV fluids.  She is on olanzapine. Patient is followed by nutrition.  Maximize high-protein/high-calorie foods.  Patient will likely benefit from intermittent supportive care/IV fluids  Neoplasm related pain -continue fentanyl/oxycodone  RTC later this week to see Dr. Janese Banks.   Patient expressed understanding and was in agreement with this plan. She also understands that She can call clinic at any time with any questions, concerns, or complaints.   Thank you for allowing me to participate in the care of this very pleasant patient.   Time Total: 20 minutes  Visit consisted of counseling and education dealing with the complex and emotionally intense issues of symptom management in the setting of serious illness.Greater than 50%  of this time was spent counseling and coordinating care related to the above assessment and plan.  Signed by: Altha Harm, PhD, NP-C

## 2022-06-17 ENCOUNTER — Ambulatory Visit
Admission: RE | Admit: 2022-06-17 | Discharge: 2022-06-17 | Disposition: A | Discharge status: Home / Self Care | Payer: PPO | Source: Ambulatory Visit | Attending: Oncology | Admitting: Oncology

## 2022-06-17 ENCOUNTER — Other Ambulatory Visit: Payer: Self-pay | Admitting: *Deleted

## 2022-06-17 DIAGNOSIS — D7389 Other diseases of spleen: Secondary | ICD-10-CM | POA: Diagnosis not present

## 2022-06-17 DIAGNOSIS — C7951 Secondary malignant neoplasm of bone: Secondary | ICD-10-CM | POA: Insufficient documentation

## 2022-06-17 DIAGNOSIS — J398 Other specified diseases of upper respiratory tract: Secondary | ICD-10-CM | POA: Diagnosis not present

## 2022-06-17 DIAGNOSIS — C3490 Malignant neoplasm of unspecified part of unspecified bronchus or lung: Secondary | ICD-10-CM | POA: Diagnosis not present

## 2022-06-17 DIAGNOSIS — J90 Pleural effusion, not elsewhere classified: Secondary | ICD-10-CM | POA: Diagnosis not present

## 2022-06-17 DIAGNOSIS — J9809 Other diseases of bronchus, not elsewhere classified: Secondary | ICD-10-CM | POA: Diagnosis not present

## 2022-06-17 DIAGNOSIS — J9859 Other diseases of mediastinum, not elsewhere classified: Secondary | ICD-10-CM | POA: Diagnosis not present

## 2022-06-17 DIAGNOSIS — K5730 Diverticulosis of large intestine without perforation or abscess without bleeding: Secondary | ICD-10-CM | POA: Diagnosis not present

## 2022-06-17 MED ORDER — IOHEXOL 300 MG/ML  SOLN
100.0000 mL | Freq: Once | INTRAMUSCULAR | Status: AC | PRN
  Administered 2022-06-17: 100 mL via INTRAVENOUS

## 2022-06-17 MED FILL — Dexamethasone Sodium Phosphate Inj 100 MG/10ML: INTRAMUSCULAR | Qty: 1 | Status: AC

## 2022-06-18 ENCOUNTER — Inpatient Hospital Stay (HOSPITAL_BASED_OUTPATIENT_CLINIC_OR_DEPARTMENT_OTHER): Payer: PPO | Admitting: Oncology

## 2022-06-18 ENCOUNTER — Ambulatory Visit
Admission: RE | Admit: 2022-06-18 | Discharge: 2022-06-18 | Disposition: A | Discharge status: Home / Self Care | Payer: PPO | Source: Ambulatory Visit | Attending: Radiation Oncology | Admitting: Radiation Oncology

## 2022-06-18 ENCOUNTER — Inpatient Hospital Stay: Payer: PPO

## 2022-06-18 ENCOUNTER — Encounter: Payer: Self-pay | Admitting: Oncology

## 2022-06-18 ENCOUNTER — Ambulatory Visit: Payer: PPO

## 2022-06-18 ENCOUNTER — Ambulatory Visit: Payer: PPO | Admitting: Oncology

## 2022-06-18 ENCOUNTER — Other Ambulatory Visit: Payer: PPO

## 2022-06-18 VITALS — BP 130/74 | HR 99 | Temp 96.6°F | Resp 22

## 2022-06-18 DIAGNOSIS — C3490 Malignant neoplasm of unspecified part of unspecified bronchus or lung: Secondary | ICD-10-CM | POA: Diagnosis not present

## 2022-06-18 DIAGNOSIS — Z7189 Other specified counseling: Secondary | ICD-10-CM

## 2022-06-18 DIAGNOSIS — Z510 Encounter for antineoplastic radiation therapy: Secondary | ICD-10-CM | POA: Diagnosis not present

## 2022-06-18 DIAGNOSIS — C3411 Malignant neoplasm of upper lobe, right bronchus or lung: Secondary | ICD-10-CM | POA: Diagnosis not present

## 2022-06-18 DIAGNOSIS — Z7983 Long term (current) use of bisphosphonates: Secondary | ICD-10-CM

## 2022-06-18 DIAGNOSIS — C7951 Secondary malignant neoplasm of bone: Secondary | ICD-10-CM

## 2022-06-18 DIAGNOSIS — Z5181 Encounter for therapeutic drug level monitoring: Secondary | ICD-10-CM | POA: Diagnosis not present

## 2022-06-18 DIAGNOSIS — Z87891 Personal history of nicotine dependence: Secondary | ICD-10-CM | POA: Diagnosis not present

## 2022-06-18 LAB — COMPREHENSIVE METABOLIC PANEL
ALT: 13 U/L (ref 0–44)
AST: 22 U/L (ref 15–41)
Albumin: 2.8 g/dL — ABNORMAL LOW (ref 3.5–5.0)
Alkaline Phosphatase: 95 U/L (ref 38–126)
Anion gap: 9 (ref 5–15)
BUN: 9 mg/dL (ref 8–23)
CO2: 25 mmol/L (ref 22–32)
Calcium: 8.7 mg/dL — ABNORMAL LOW (ref 8.9–10.3)
Chloride: 105 mmol/L (ref 98–111)
Creatinine, Ser: 1.16 mg/dL — ABNORMAL HIGH (ref 0.44–1.00)
GFR, Estimated: 50 mL/min — ABNORMAL LOW (ref >60)
Glucose, Bld: 120 mg/dL — ABNORMAL HIGH (ref 70–99)
Potassium: 4.3 mmol/L (ref 3.5–5.1)
Sodium: 139 mmol/L (ref 135–145)
Total Bilirubin: 0.1 mg/dL — ABNORMAL LOW (ref 0.3–1.2)
Total Protein: 6.7 g/dL (ref 6.5–8.1)

## 2022-06-18 LAB — CBC WITH DIFFERENTIAL/PLATELET
Abs Immature Granulocytes: 0.32 10*3/uL — ABNORMAL HIGH (ref 0.00–0.07)
Basophils Absolute: 0.1 10*3/uL (ref 0.0–0.1)
Basophils Relative: 1 %
Eosinophils Absolute: 0 10*3/uL (ref 0.0–0.5)
Eosinophils Relative: 0 %
HCT: 26.1 % — ABNORMAL LOW (ref 36.0–46.0)
Hemoglobin: 8.5 g/dL — ABNORMAL LOW (ref 12.0–15.0)
Immature Granulocytes: 5 %
Lymphocytes Relative: 15 %
Lymphs Abs: 1 10*3/uL (ref 0.7–4.0)
MCH: 30.6 pg (ref 26.0–34.0)
MCHC: 32.6 g/dL (ref 30.0–36.0)
MCV: 93.9 fL (ref 80.0–100.0)
Monocytes Absolute: 2.1 10*3/uL — ABNORMAL HIGH (ref 0.1–1.0)
Monocytes Relative: 33 %
Neutro Abs: 2.9 10*3/uL (ref 1.7–7.7)
Neutrophils Relative %: 46 %
Platelets: 729 10*3/uL — ABNORMAL HIGH (ref 150–400)
RBC: 2.78 MIL/uL — ABNORMAL LOW (ref 3.87–5.11)
RDW: 16.6 % — ABNORMAL HIGH (ref 11.5–15.5)
WBC: 6.3 10*3/uL (ref 4.0–10.5)
nRBC: 0 % (ref 0.0–0.2)

## 2022-06-18 MED ORDER — SODIUM CHLORIDE 0.9 % IV SOLN
Freq: Once | INTRAVENOUS | Status: AC
  Filled 2022-06-18: qty 250

## 2022-06-18 MED ORDER — SODIUM CHLORIDE 0.9% FLUSH
10.0000 mL | Freq: Once | INTRAVENOUS | Status: DC
  Filled 2022-06-18: qty 10

## 2022-06-18 MED ORDER — ZOLEDRONIC ACID 4 MG/100ML IV SOLN
4.0000 mg | INTRAVENOUS | Status: DC
  Administered 2022-06-18: 4 mg via INTRAVENOUS
  Filled 2022-06-18: qty 100

## 2022-06-18 MED ORDER — HEPARIN SOD (PORK) LOCK FLUSH 100 UNIT/ML IV SOLN
500.0000 [IU] | Freq: Once | INTRAVENOUS | Status: AC
  Administered 2022-06-18: 500 [IU] via INTRAVENOUS
  Filled 2022-06-18: qty 5

## 2022-06-18 NOTE — Patient Instructions (Signed)

## 2022-06-18 NOTE — Progress Notes (Signed)
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  MRI brain without contrast showed multiple skull lesions with the largest part in the occipital bone with adjacent intracranial extension of the tumor.  Smaller lesions in the high parietal bone bilaterally.  These most likely represent metastatic disease.  Extraosseous extension of the tumor in the left occipital lobe with edema.  CT chest abdomen and pelvis with contrast showed multiple enlarged mediastinal and thoracic lymph nodes in the right.  Bilateral lung nodules.  Lucent bone lesions in T7 and T11.  Right adrenal mass measuring 3.6 cm.  Bone lesions involving the left iliac and right iliac.  Patient underwent bronchoscopy and a biopsy of the hilar and mediastinal lymph nodes which was consistent with mucinous adenocarcinoma that was positive for CK7.  Negative for TTF-1 and Napsin A.  Negative for CK20 and CDX2.  This is compatible with lung primary however differential diagnosis includes GI or pancreaticobiliary origin.     The pattern of spread involving adrenal gland, bones as well as mediastinal adenopathy and spiculated lung nodules is more consistent with lung primary and patient has been treated as such.  Plan is to proceed with [place]  Alimta Keytruda palliative chemotherapy   NGS testing did not show any actionable mutations.  PD-L1 10%.  K-ras G12 Vmutation  Interval history-patient continues to feel poorly.  She reports ongoing fatigue as well as exertional shortness of breath.  Appetite and oral intake is poor.  ECOG PS- 2 Pain scale- 3 Opioid associated constipation- no  Review of systems- Review of Systems  Constitutional:  Positive for malaise/fatigue and weight loss. Negative for chills and fever.       Loss of appetite  HENT:  Negative for congestion, ear discharge and nosebleeds.   Eyes:  Negative for blurred vision.  Respiratory:  Negative for cough, hemoptysis, sputum production, shortness of breath and wheezing.   Cardiovascular:  Negative for chest pain, palpitations, orthopnea and claudication.  Gastrointestinal:  Negative for abdominal pain, blood in stool, constipation, diarrhea, heartburn, melena, nausea and vomiting.  Genitourinary:  Negative for dysuria, flank pain, frequency, hematuria and urgency.  Musculoskeletal:  Negative for back pain, joint pain and myalgias.  Skin:  Negative for rash.  Neurological:  Negative for dizziness, tingling, focal weakness, seizures, weakness and headaches.  Endo/Heme/Allergies:  Does not bruise/bleed easily.  Psychiatric/Behavioral:  Negative for depression and suicidal ideas. The patient does not have insomnia.       Allergies  Allergen Reactions   Latex Rash   Levofloxacin     FLUOROQUINOLONES - unknown reaction     Past Medical History:  Diagnosis Date   Anxiety    Arthritis    COPD (chronic obstructive pulmonary disease) ([place])    Depression 01/31/2016   Goiter    Hyperlipidemia    TGs  Hypothyroidism    Lung cancer ([place])    Seasonal allergies      Past Surgical History:  Procedure Laterality Date   ABDOMINAL HYSTERECTOMY     supracervical    BILATERAL OOPHORECTOMY  2001   BENIGN TUMOR   BREAST BIOPSY Left 1985   cyst    BRONCHIAL  NEEDLE ASPIRATION BIOPSY  03/25/2022   Procedure: BRONCHIAL NEEDLE ASPIRATION BIOPSIES;  Surgeon: Lanier Clam, MD;  Location: [facility];  Service: Endoscopy;;   CATARACT EXTRACTION Right    COLONOSCOPY  08/17/2009   COLONOSCOPY WITH PROPOFOL N/A 03/19/2016   Procedure: COLONOSCOPY WITH PROPOFOL;  Surgeon: Robert Bellow, MD;  Location: [facility];  Service: Endoscopy;  Laterality: N/A;   COLONOSCOPY WITH PROPOFOL N/A 05/15/2021   Procedure: COLONOSCOPY WITH PROPOFOL;  Surgeon: Robert Bellow, MD;  Location: [facility];  Service: Endoscopy;  Laterality: N/A;   CORNEAL TRANSPLANT Right    ENDOBRONCHIAL ULTRASOUND N/A 03/25/2022   Procedure: ENDOBRONCHIAL ULTRASOUND;  Surgeon: Lanier Clam, MD;  Location: [facility];  Service: Endoscopy;  Laterality: N/A;   EYE SURGERY     IR IMAGING GUIDED PORT INSERTION  03/26/2022   JOINT REPLACEMENT     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   Procedure: TOTAL HIP ARTHROPLASTY ANTERIOR APPROACH;  Surgeon: Hessie Knows, MD;  Location: [facility];  Service: Orthopedics;  Laterality: Right;   VIDEO BRONCHOSCOPY  03/25/2022   Procedure: VIDEO BRONCHOSCOPY WITHOUT FLUORO;  Surgeon: Lanier Clam, MD;  Location: [facility];  Service: Endoscopy;;    Social History   Socioeconomic History   Marital status: Married    Spouse name: Not on file   Number of children: Not on file   Years of education: Not on file   Highest education level: Not on file  Occupational History   Occupation: retired  Tobacco Use   Smoking status: Former    Packs/day: 1.00    Years: 24.00    Total pack years: 24.00    Types: Cigarettes    Quit date: 05/09/1998    Years since quitting: 24.1   Smokeless tobacco: Never  Vaping Use   Vaping Use: Never used  Substance and Sexual Activity   Alcohol use: Not Currently    Comment: rarely    Drug use: No   Sexual activity: Not Currently  Other Topics Concern   Not on file  Social  History Narrative   Not on file   Social Determinants of Health   Financial Resource Strain: Low Risk  (03/29/2021)   Overall Financial Resource Strain (CARDIA)    Difficulty of Paying Living Expenses: Not hard at all  Food Insecurity: No Food Insecurity (03/20/2022)   Hunger Vital Sign    Worried About Running Out of Food in the Last Year: Never true    [place] in the Last Year: Never true  Transportation Needs: No Transportation Needs (03/20/2022)   PRAPARE - Hydrologist (Medical): No    Lack of Transportation (Non-Medical): No  Physical Activity: Insufficiently Active (03/29/2021)   Exercise Vital Sign    Days of Exercise per Week: 3 days    Minutes of Exercise per Session: 30 min  Stress: No Stress Concern Present (03/29/2021)   [place]    Feeling of Stress : Not at all  Social Connections: Unknown (03/29/2021)   Social Connection and Isolation Panel [NHANES]    Frequency of Communication with  Friends and Family: Not on file    Frequency of Social Gatherings with Friends and Family: Not on file    Attends Religious Services: Not on file    Active Member of Clubs or Organizations: Not on file    Attends Archivist Meetings: Not on file    Marital Status: Married  Intimate Partner Violence: Not At Risk (03/20/2022)   Humiliation, Afraid, Rape, and Kick questionnaire    Fear of Current or Ex-Partner: No    Emotionally Abused: No    Physically Abused: No    Sexually Abused: No    Family History  Problem Relation Age of Onset   Cancer Mother        OVARIAN   Stroke Father    Cancer Sister        breast   Atrial fibrillation Sister    Atrial fibrillation Brother    Thyroid disease Other        MULTIPLE FAMILY MEMBERS     Current Outpatient Medications:    acetaminophen (TYLENOL) 500 MG tablet, Take 1,000 mg by mouth every 6 (six) hours as needed for  moderate pain., Disp: , Rfl:    albuterol (VENTOLIN HFA) 108 (90 Base) MCG/ACT inhaler, Inhale 2 puffs into the lungs every 6 (six) hours as needed for wheezing or shortness of breath., Disp: 8 g, Rfl: 2   ALPRAZolam (XANAX) 0.5 MG tablet, Take 1 tablet (0.5 mg total) by mouth 2 (two) times daily as needed for anxiety., Disp: 60 tablet, Rfl: 2   fenofibrate micronized (LOFIBRA) 134 MG capsule, Take 1 capsule (134 mg total) by mouth daily before breakfast., Disp: 30 capsule, Rfl: 11   levothyroxine (SYNTHROID) 75 MCG tablet, TAKE 1 TABLET EVERY DAY ON EMPTY STOMACHWITH A GLASS OF WATER AT LEAST 30-60 MINBEFORE BREAKFAST (Patient taking differently: Take 75 mcg by mouth daily before breakfast. On an empty stomach with a glass of water at least 30-60 minutes before breakfast.), Disp: 90 tablet, Rfl: 1   mirtazapine (REMERON) 15 MG tablet, Take 1 tablet (15 mg total) by mouth at bedtime., Disp: 30 tablet, Rfl: 1   omeprazole (PRILOSEC) 20 MG capsule, TAKE 1 CAPSULE BY MOUTH DAILY (Patient taking differently: Take 20 mg by mouth daily.), Disp: 30 capsule, Rfl: 3   ondansetron (ZOFRAN) 4 MG tablet, Take 1 tablet (4 mg total) by mouth every 6 (six) hours as needed for nausea., Disp: 30 tablet, Rfl: 1   oxyCODONE (OXY IR/ROXICODONE) 5 MG immediate release tablet, Take 1-2 tablets (5-10 mg total) by mouth every 4 (four) hours as needed (pain)., Disp: 60 tablet, Rfl: 0   potassium chloride SA (KLOR-CON M) 20 MEQ tablet, Take 1 tablet (20 mEq total) by mouth daily., Disp: 14 tablet, Rfl: 0   prednisoLONE acetate (PRED FORTE) 1 % ophthalmic suspension, Place 1 drop into the right eye daily., Disp: , Rfl:    RESTASIS 0.05 % ophthalmic emulsion, Place 1 drop into both eyes 2 (two) times daily., Disp: , Rfl:    rosuvastatin (CRESTOR) 10 MG tablet, TAKE ONE TABLET EVERY DAY. (Patient taking differently: Take 10 mg by mouth daily.), Disp: 90 tablet, Rfl: 3   albuterol (PROVENTIL) (2.5 MG/3ML) 0.083% nebulizer solution,  Inhale into the lungs. (Patient not taking: Reported on 06/18/2022), Disp: , Rfl:    fentaNYL (DURAGESIC) 12 MCG/HR, Place 1 patch onto the skin every 3 (three) days. (Patient not taking: Reported on 06/18/2022), Disp: 10 patch, Rfl: 0   OLANZapine (ZYPREXA) 10 MG tablet,  Take 1 tablet (10 mg total) by mouth at bedtime. (Patient not taking: Reported on 06/18/2022), Disp: 30 tablet, Rfl: 2   promethazine (PHENERGAN) 12.5 MG tablet, Take 1 tablet (12.5 mg total) by mouth every 8 (eight) hours as needed for nausea or vomiting. (Patient not taking: Reported on 04/09/2022), Disp: 30 tablet, Rfl: 0 No current facility-administered medications for this visit.  Facility-Administered Medications Ordered in Other Visits:    sodium chloride flush (NS) 0.9 % injection 10 mL, 10 mL, Intravenous, Once, Sindy Guadeloupe, MD   Zoledronic Acid (ZOMETA) IVPB 4 mg, 4 mg, Intravenous, Q28 days, Sindy Guadeloupe, MD, Stopped at 06/18/22 1139  Physical exam:  Vitals:   06/18/22 1037  BP: 130/74  Pulse: 99  Resp: (!) 22  Temp: (!) 96.6 F (35.9 C)  TempSrc: Tympanic  SpO2: 98%   Physical Exam Constitutional:      Comments: She is on home oxygen  Cardiovascular:     Rate and Rhythm: Regular rhythm. Tachycardia present.     Heart sounds: Normal heart sounds.  Pulmonary:     Effort: Pulmonary effort is normal.     Breath sounds: Normal breath sounds.  Skin:    General: Skin is warm and dry.  Neurological:     Mental Status: She is alert and oriented to person, place, and time.         Latest Ref Rng & Units 06/18/2022   10:09 AM  CMP  Glucose 70 - 99 mg/dL 120   BUN 8 - 23 mg/dL 9   Creatinine 0.44 - 1.00 mg/dL 1.16   Sodium 135 - 145 mmol/L 139   Potassium 3.5 - 5.1 mmol/L 4.3   Chloride 98 - 111 mmol/L 105   CO2 22 - 32 mmol/L 25   Calcium 8.9 - 10.3 mg/dL 8.7   Total Protein 6.5 - 8.1 g/dL 6.7   Total Bilirubin 0.3 - 1.2 mg/dL 0.1   Alkaline Phos 38 - 126 U/L 95   AST 15 - 41 U/L 22   ALT 0 - 44  U/L 13       Latest Ref Rng & Units 06/18/2022   10:09 AM  CBC  WBC 4.0 - 10.5 K/uL 6.3   Hemoglobin 12.0 - 15.0 g/dL 8.5   Hematocrit 36.0 - 46.0 % 26.1   Platelets 150 - 400 K/uL 729       CT CHEST ABDOMEN PELVIS W CONTRAST  Result Date: 06/17/2022 CLINICAL DATA:  Metastatic lung cancer. Current chemotherapy patient. Shortness of breath. * Tracking Code: BO * EXAM: CT CHEST, ABDOMEN, AND PELVIS WITH CONTRAST TECHNIQUE: Multidetector CT imaging of the chest, abdomen and pelvis was performed following the standard protocol during bolus administration of intravenous contrast. RADIATION DOSE REDUCTION: This exam was performed according to the departmental dose-optimization program which includes automated exposure control, adjustment of the mA and/or kV according to patient size and/or use of iterative reconstruction technique. CONTRAST:  129mL OMNIPAQUE IOHEXOL 300 MG/ML  SOLN COMPARISON:  03/20/2022 FINDINGS: CT CHEST FINDINGS Cardiovascular: Right Port-A-Cath tip: Lower SVC. Progressive right hilar and mediastinal mass causes worsened narrowing of the right upper lobe and right lower lobe pulmonary arteries (image 22 and 26 of series 3, respectively) and increased encroachment and extrinsic compression of the lower SVC (image 24, series 3). Aortic arch atherosclerotic vascular disease.  Mild cardiomegaly. Mediastinum/Nodes: As noted above, there is progressive confluent right hilar and mediastinal adenopathy contributing to narrowing of the trachea at the carina (image 19,  series 3) and narrowing of the bronchus intermedius (image 24, series 3). Tracheomalacia may also be contributing to the tracheal narrowing given the somewhat slit-like cross-sectional contour of the lower trachea. Overall central right tumor measures about 5.1 by 5.7 cm on image 24 series 3, previously this was made up of individual nodes measuring up to about 2.3 cm individually. A right lower paratracheal node measuring 1.9 cm in  short axis on image 19 series 3 previously measured about 2.0 cm. A right internal mammary lymph node currently measures 1.9 cm in short axis on image 29 series 3 (formerly 1.1 cm) and invades the third intercostal space. Lungs/Pleura: Small right pleural effusion with possible tethering of the visceral and parietal pleura on the right on image 27 of series 3. Trace left pleural effusion. Marked airway narrowing and plugging in the right upper lobe with secondary pulmonary lobular septal thickening and ground-glass opacities throughout much of the right upper lobe. Substantial atelectasis in the right middle lobe and right lower lobe with marked airway narrowing. On the left side there is a somewhat lesser degree of secondary pulmonary lobular septal thickening but with scattered ground-glass pulmonary nodularity and scattered additional potential metastatic nodularity. This includes a new 1.2 by 1.0 cm nodule anteriorly in the left upper lobe on image 51 series 4 and scattered smaller nodules along the left pleural surfaces, in the left upper lobe, and left lower lobe. Musculoskeletal: Progressive lytic destruction of the right fourth rib, with a new soft tissue mass component measuring 3.0 by 1.6 cm in the right chest wall on image 24 series 3. Probably pathologic fracture of the right eleventh rib posteriorly on image 48 of series 3 is new and associated with an abnormal rib lucency which is likewise new. Small lytic lesion of the left posterior sternal manubrium adjacent to the sternoclavicular joint. Lytic lesion in the right distal clavicle. Lucent lesions at T7, T9, and T11 in the thoracic spine compatible with new thoracic spine metastatic lesions. There is also some left posterior vertebral body and pedicle lytic involvement at the T7 vertebra. Right lower axillary lymph node or posterior breast nodule is enhancing and measures 1.3 cm in diameter on image 39 of series 3, new compared to the prior exam  likely representing malignancy. A new small enhancing nodule in the left lower breast measures 0.7 cm in diameter on image 41 series 3. Subcutaneous nodule posterior to the left latissimus dorsi muscle measuring 1.2 by 0.8 cm on image 30 series 3, new compared to the prior exam and suspicious for subcutaneous metastatic lesion. CT ABDOMEN PELVIS FINDINGS Hepatobiliary: Unremarkable Pancreas: Unremarkable Spleen: There are 2-3 new small hypodense lesions in the spleen which are technically nonspecific but given the clinical context raise some concern for potential early metastatic disease. A 0.7 cm lesion in the lower spleen on image 52 of series 3 represents an index lesion. Adrenals/Urinary Tract: Right adrenal mass 3.3 by 1.6 cm on image 47 series 3, formerly 3.7 by 2.0 cm. Appearance suspicious for malignancy but mildly improved from previous. Kidneys unremarkable. Stomach/Bowel: Sigmoid colon diverticulosis. Vascular/Lymphatic: Atherosclerosis is present, including aortoiliac atherosclerotic disease. Reproductive: Partially obscured by streak artifact from the right hip implant. Suspected hysterectomy. Other: New 0.8 by 0.5 cm soft tissue nodule along the lower omentum, image 98 of series 3. New right lower quadrant omental nodule, 0.9 by 0.8 cm on image 88 series 3. Musculoskeletal: Lytic metastatic lesions particularly at L1, L4, L5, in the right iliac bone (image 130,  series 5), and in the left iliac bone (image 95, series 5). These are new and/or substantially enlarged compared to previous. New soft tissue mass in the left gluteus maximus muscle, 2.4 by 1.6 cm on image 98 series 3, compatible with metastatic lesion. 0.8 cm nodule along the superficial fascia margin of the gluteal musculature on the right on image 93 series 3. Multifocal rim enhancing nodularity in the lumbar paraspinal musculature, including a 1.1 cm in diameter lesion in the left paraspinal musculature on image 61 series 3, compatible with  additional intramuscular metastatic lesions. IMPRESSION: 1. Significant progression of malignancy, with enlargement of the right hilar and mediastinal mass, progressive confluent right hilar and mediastinal adenopathy, new bilateral pulmonary nodules, new bilateral pleural effusions, new subcutaneous and intramuscular metastatic lesions, and new and/or enlarging lytic osseous metastatic lesions. 2. There is also increased narrowing of the trachea at the carina and narrowing of the bronchus intermedius due to extrinsic compression from the right hilar and mediastinal mass. There is also considerable airway narrowing and plugging in the right upper lobe. 3. New small hypodense lesions in the spleen are technically nonspecific but raise the possibility of early metastatic disease. 4. Reduced size of the right adrenal mass, currently 3.3 by 1.6 cm, previously 3.7 by 2.0 cm. 5. New small right and trace left pleural effusions. The right pleural effusion may be partially loculated and there is some tethering of the visceral and parietal pleura on the right. Trace left pleural effusion. 6. Progressive lytic involvement of the right fourth rib and right eleventh rib. 7. New small soft tissue nodules in the subcutaneous tissues posterior to the left latissimus dorsi muscle and in the right gluteal musculature compatible with subcutaneous metastatic lesions. New small enhancing inferior breast nodules, likely metastatic. 8. Aortic atherosclerosis. Aortic Atherosclerosis (ICD10-I70.0). Electronically Signed   By: Van Clines M.D.   On: 06/17/2022 19:45   DG Sniff Test  Result Date: 06/04/2022 CLINICAL DATA:  Shortness of breath, right lung cancer EXAM: CHEST FLUOROSCOPY TECHNIQUE: Real-time fluoroscopic evaluation of the chest was performed. FLUOROSCOPY: Radiation Exposure Index (as provided by the fluoroscopic device): 2.9 mGy COMPARISON:  CT chest 03/20/2022 FINDINGS: Real-time fluoroscopy of the diaphragms was  performed during inspiration, expiration and sniffing. Elevation of the right diaphragm. Right basilar atelectasis. No significant diaphragmatic motion during inspiration, expiration or sniffing. Normal left diaphragmatic motion during inspiration, expiration and sniffing. IMPRESSION: 1. No significant right diaphragmatic motion during inspiration, expiration or sniffing consistent with right diaphragmatic paralysis. Electronically Signed   By: Kathreen Devoid M.D.   On: 06/04/2022 12:38     Assessment and plan- Patient is a 72 y.o. female with metastatic lung adenocarcinoma here for on treatment assessment prior to cycle 4 of carbo Alimta Keytruda chemotherapy and discuss CT scan results and further management  I have reviewed CT chestAnd pelvis image independently and discussed findings with the patient which unfortunately shows significant disease progression after 3 cycles of carbo Alimta Keytruda chemotherapy.  There is progressive increase in the right hilar and mediastinal mass leading to significant narrowing of the right upper lobe as well as lower lobe and encroachment upon the SVC.  There is a new soft tissue mass involving the right fourth rib measuring 3 cm and a pathologic fracture of the right 11th rib new thoracic spine metastases as well as new omental metastases.  Possible hypodense lesions in the spleen that represent metastatic disease.  The fact that she has had progression on first-line chemotherapy itself within  9 weeks of initiation is a marker of poor prognosis.  I have discussed her case with Dr. Donella Stade and given the fact that she has worsening lung disease with mediastinal adenopathy and encroachment of SVC he is going to offer her palliative radiation for 4 weeks starting next week.  I am holding off on chemotherapy today and I will initiate weekly CarboTaxol chemotherapy for her starting next week while she is getting radiation.  Following completion of radiation I will give her 2-3  additional cycles of CarboTaxol and Avastin chemotherapy.  I will be doing interim scans again in 2 months time.  We discussed foregoing systemic chemotherapy and consideration for home hospice as well after palliative radiation.  Patient does not wish to pursue that option at this time and would like to move forward with chemotherapy.  Also discussed original pathology findings which were compatible with lung primary but less likely possibilities included GE junction primary versus hepatobiliary and pancreatic primaries.  CT scans have not shown any hepatobiliary or pancreatic masses.  There was no overt abnormality noted in the esophagus on scans as well.  Consideration can be given for EGD but given that her pulmonary status is tenuous and she is on oxygen this could be challenging.  Patient would like to hold off on getting an EGD at this time.  I also offered her the option of second opinion at [facility] and she would like to hold off on that as well.  Patient will see NP Altha Harm in 1 week when she gets CarboTaxol chemotherapy and I will see her back in 2 weeks.  Will continue to have goals of care conversation with her.  Bone metastases: She will receive Zometa today   Visit Diagnosis 1. 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 Goals of care, counseling/discussion   3. Encounter for monitoring zoledronic acid therapy      Dr. Randa Evens, MD, MPH [facility] [phone] 06/18/2022 12:56 PM

## 2022-06-18 NOTE — Consult Note (Signed)
NEW PATIENT EVALUATION  Name: Lorraine Mills  MRN: [id number]  Date:   06/18/2022     DOB: May 20, 1951   This 72 y.o. 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   PREVIOUS INVESTIGATIONS:  CT scans reviewed Pathology reports reviewed Clinical notes reviewed  HPI: Patient is a 72 year old female with multiple medical comorbidities currently under treatment with Alimta and Keytruda for stage IVa mucinous adenocarcinoma.  She has developed increasing dyspnea on exertion and was recently started on home oxygen.  CT scan of her chest yesterday showed significant progression of malignancy within large from the right hilar mediastinal mass with progressive confluent right hilar and mediastinal adenopathy.  There is also increased narrowing of the trachea at the carina and narrowing of the bronchus intermedius due to extrinsic compression from her tumor.  Patient is treated previous been treated for palliation to her calvarium for multiple metastatic lesions which has improved her pain in that region.  She specifically [place] hemoptysis.  Patient does have persistent low back pain has CT evidence of multiple involvement of thoracic lumbar spine.  PLANNED TREATMENT REGIMEN: Palliative radiation therapy to her chest  PAST MEDICAL HISTORY:  has a past medical history of Anxiety, Arthritis, COPD (chronic obstructive pulmonary disease) ([place]), Depression (01/31/2016), Goiter, Hyperlipidemia, Hypothyroidism, Lung cancer ([place]), and Seasonal allergies.    PAST SURGICAL HISTORY:  Past Surgical History:  Procedure Laterality Date   ABDOMINAL HYSTERECTOMY      supracervical    BILATERAL OOPHORECTOMY  2001   BENIGN TUMOR   BREAST BIOPSY Left 1985   cyst    BRONCHIAL NEEDLE ASPIRATION BIOPSY  03/25/2022   Procedure: BRONCHIAL NEEDLE ASPIRATION BIOPSIES;  Surgeon: Lanier Clam, MD;  Location: [facility];  Service: Endoscopy;;   CATARACT EXTRACTION Right    COLONOSCOPY  08/17/2009   COLONOSCOPY WITH PROPOFOL N/A 03/19/2016   Procedure: COLONOSCOPY WITH PROPOFOL;  Surgeon: Robert Bellow, MD;  Location: [facility] ENDOSCOPY;  Service: Endoscopy;  Laterality: N/A;   COLONOSCOPY WITH PROPOFOL N/A 05/15/2021   Procedure: COLONOSCOPY WITH PROPOFOL;  Surgeon: Robert Bellow, MD;  Location: [facility] ENDOSCOPY;  Service: Endoscopy;  Laterality: N/A;   CORNEAL TRANSPLANT Right    ENDOBRONCHIAL ULTRASOUND N/A 03/25/2022   Procedure: ENDOBRONCHIAL ULTRASOUND;  Surgeon: Lanier Clam, MD;  Location: [facility];  Service: Endoscopy;  Laterality: N/A;   EYE SURGERY     IR IMAGING GUIDED PORT INSERTION  03/26/2022   JOINT REPLACEMENT     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   Procedure: TOTAL HIP ARTHROPLASTY ANTERIOR APPROACH;  Surgeon: Hessie Knows, MD;  Location: [facility] ORS;  Service: Orthopedics;  Laterality: Right;   VIDEO BRONCHOSCOPY  03/25/2022   Procedure: VIDEO BRONCHOSCOPY WITHOUT FLUORO;  Surgeon: Lanier Clam, MD;  Location: [facility];  Service: Endoscopy;;    FAMILY HISTORY: family history includes Atrial fibrillation in her brother and sister; Cancer in her mother and sister; Stroke in her father; Thyroid disease in an other family member.  SOCIAL HISTORY:  reports that she quit smoking about 24 years ago. Her smoking use included cigarettes. She has a 24.00 pack-year smoking history. She has never used smokeless tobacco. She reports that  she does not currently use alcohol. She reports that she does not use drugs.  ALLERGIES: Latex and Levofloxacin  MEDICATIONS:  Current Outpatient Medications  Medication Sig  Dispense Refill   acetaminophen (TYLENOL) 500 MG tablet Take 1,000 mg by mouth every 6 (six) hours as needed for moderate pain.     albuterol (VENTOLIN HFA) 108 (90 Base) MCG/ACT inhaler Inhale 2 puffs into the lungs every 6 (six) hours as needed for wheezing or shortness of breath. 8 g 2   ALPRAZolam (XANAX) 0.5 MG tablet Take 1 tablet (0.5 mg total) by mouth 2 (two) times daily as needed for anxiety. 60 tablet 2   dexamethasone (DECADRON) 4 MG tablet Take 1 tab 2 times daily starting day before pemetrexed. Then take 2 tabs daily x 3 days starting day after carboplatin. Take with food. (Patient not taking: Reported on 06/18/2022) 30 tablet 1   fenofibrate micronized (LOFIBRA) 134 MG capsule Take 1 capsule (134 mg total) by mouth daily before breakfast. 30 capsule 11   fentaNYL (DURAGESIC) 12 MCG/HR Place 1 patch onto the skin every 3 (three) days. (Patient not taking: Reported on 06/18/2022) 10 patch 0   folic acid (FOLVITE) 1 MG tablet Take 1 tablet (1 mg total) by mouth daily. Start 7 days before pemetrexed chemotherapy. Continue until 21 days after pemetrexed completed. 100 tablet 3   levothyroxine (SYNTHROID) 75 MCG tablet TAKE 1 TABLET EVERY DAY ON EMPTY STOMACHWITH A GLASS OF WATER AT LEAST 30-60 MINBEFORE BREAKFAST (Patient taking differently: Take 75 mcg by mouth daily before breakfast. On an empty stomach with a glass of water at least 30-60 minutes before breakfast.) 90 tablet 1   lidocaine-prilocaine (EMLA) cream Apply to affected area once 30 g 3   mirtazapine (REMERON) 15 MG tablet Take 1 tablet (15 mg total) by mouth at bedtime. 30 tablet 1   OLANZapine (ZYPREXA) 10 MG tablet Take 1 tablet (10 mg total) by mouth at bedtime. (Patient not taking: Reported on 06/18/2022) 30 tablet 2   omeprazole (PRILOSEC) 20 MG capsule TAKE 1 CAPSULE BY MOUTH DAILY (Patient taking differently: Take 20 mg by mouth daily.) 30 capsule 3   ondansetron (ZOFRAN) 4 MG tablet Take 1 tablet (4 mg total) by mouth every  6 (six) hours as needed for nausea. 30 tablet 1   ondansetron (ZOFRAN) 8 MG tablet Take 1 tablet (8 mg total) by mouth every 8 (eight) hours as needed for nausea or vomiting. Start on the third day after carboplatin. 30 tablet 1   oxyCODONE (OXY IR/ROXICODONE) 5 MG immediate release tablet Take 1-2 tablets (5-10 mg total) by mouth every 4 (four) hours as needed (pain). 60 tablet 0   potassium chloride SA (KLOR-CON M) 20 MEQ tablet Take 1 tablet (20 mEq total) by mouth daily. 14 tablet 0   prednisoLONE acetate (PRED FORTE) 1 % ophthalmic suspension Place 1 drop into the right eye daily.     prochlorperazine (COMPAZINE) 10 MG tablet Take 1 tablet (10 mg total) by mouth every 6 (six) hours as needed for nausea or vomiting. (Patient not taking: Reported on 04/09/2022) 30 tablet 1   promethazine (PHENERGAN) 12.5 MG tablet Take 1 tablet (12.5 mg total) by mouth every 8 (eight) hours as needed for nausea or vomiting. (Patient not taking: Reported on 04/09/2022) 30 tablet 0   RESTASIS 0.05 % ophthalmic emulsion Place 1 drop into both eyes 2 (two) times daily.     rosuvastatin (CRESTOR) 10 MG tablet TAKE ONE TABLET EVERY DAY. (  Patient taking differently: Take 10 mg by mouth daily.) 90 tablet 3   No current facility-administered medications for this encounter.    ECOG PERFORMANCE STATUS:  2 - Symptomatic, <50% confined to bed  REVIEW OF SYSTEMS: Patient has a history of anxiety arthritis depression hyperlipidemia and hypothyroidism. Patient denies any weight loss, fatigue, weakness, fever, chills or night sweats. Patient denies any loss of vision, blurred vision. Patient denies any ringing  of the ears or hearing loss. No irregular heartbeat. Patient denies heart murmur or history of fainting. Patient denies any chest pain or pain radiating to her upper extremities. Patient denies any shortness of breath, difficulty breathing at night, cough or hemoptysis. Patient denies any swelling in the lower legs. Patient  denies any nausea vomiting, vomiting of blood, or coffee ground material in the vomitus. Patient denies any stomach pain. Patient states has had normal bowel movements no significant constipation or diarrhea. Patient denies any dysuria, hematuria or significant nocturia. Patient denies any problems walking, swelling in the joints or loss of balance. Patient denies any skin changes, loss of hair or loss of weight. Patient denies any excessive worrying or anxiety or significant depression. Patient denies any problems with insomnia. Patient denies excessive thirst, polyuria, polydipsia. Patient denies any swollen glands, patient denies easy bruising or easy bleeding. Patient denies any recent infections, allergies or URI. Patient "s visual fields have not changed significantly in recent time.   PHYSICAL EXAM: There were no vitals taken for this visit. Frail-appearing female slightly shortness short of breath in NAD seen in the infusion suite.  There is no evidence of venous jugular distention or facial plethora or any evidence of collateral circulation on her chest.  Well-developed well-nourished patient in NAD. HEENT reveals PERLA, EOMI, discs not visualized.  Oral cavity is clear. No oral mucosal lesions are identified. Neck is clear without evidence of cervical or supraclavicular adenopathy. Lungs are clear to A&P. Cardiac examination is essentially unremarkable with regular rate and rhythm without murmur rub or thrill. Abdomen is benign with no organomegaly or masses noted. Motor sensory and DTR levels are equal and symmetric in the upper and lower extremities. Cranial nerves II through XII are grossly intact. Proprioception is intact. No peripheral adenopathy or edema is identified. No motor or sensory levels are noted. Crude visual fields are within normal range.  LABORATORY DATA: Pathology reports reviewed    RADIOLOGY RESULTS: CT scans reviewed compatible with above-stated findings   IMPRESSION:  Widespread metastatic disease in 72 year old female with progression in her chest causing increase in his shortness of breath and possible early SVC in 72 year old female  PLAN: This time like to go ahead with palliative radiation therapy to her chest.  Would plan on delivering 40 Gray over 4 weeks.  Risks and benefits of treatment including possible radiation esophagitis and dysphagia skin reaction fatigue alteration of blood counts all were discussed in detail with the patient.  I have set up and ordered CT simulation for tomorrow.  At some point may do a bone scan and offer some palliative radiation therapy to her lower spine.  Patient and husband both comprehend my treatment plan well.  I personally set up and ordered CT simulation for tomorrow.  I would like to take this opportunity to thank you for allowing me to participate in the care of your patient.Noreene Filbert, MD

## 2022-06-18 NOTE — Progress Notes (Signed)
DISCONTINUE OFF PATHWAY REGIMEN - Non-Small Cell Lung   [id number]:Carboplatin AUC=5 + Pemetrexed 500 mg/m2 q21 Days:   A cycle is every 21 days:     Pemetrexed      Carboplatin   **Always confirm dose/schedule in your pharmacy ordering system**  REASON: Disease Progression PRIOR TREATMENT: Off Pathway: Carboplatin AUC=5 + Pemetrexed 500 mg/m2 q21 Days TREATMENT RESPONSE: Progressive Disease (PD)  START OFF PATHWAY REGIMEN - Non-Small Cell Lung   [id number]:Carboplatin + Paclitaxel (2/50) + RT weekly x 6 weeks:   Administer weekly during RT:     Paclitaxel      Carboplatin   **Always confirm dose/schedule in your pharmacy ordering system**  Patient Characteristics: Stage IV Metastatic, Nonsquamous, Molecular Analysis Completed, Molecular Alteration Present and Targeted Therapy Exhausted OR EGFR Exon 20+ or KRAS G12C+ or HER2+ Present and No Prior Chemo/Immunotherapy OR No Alteration Present, Second Line -  Chemotherapy/Immunotherapy, PS = 2, Prior PD-1/PD-L1 Inhibitor + Platinum-Based Chemotherapy or No Prior PD-1/PD-L1 Inhibitor and Not a Candidate for Immunotherapy Therapeutic Status: Stage IV Metastatic Histology: Nonsquamous Cell Broad Molecular Profiling Status: Engineer, manufacturing Analysis Results: No Alteration Present ECOG Performance Status: 2 Chemotherapy/Immunotherapy Line of Therapy: Second Line Chemotherapy/Immunotherapy Immunotherapy Candidate Status: Not a Candidate for Immunotherapy Prior Immunotherapy Status: No Prior PD-1/PD-L1 Inhibitor Intent of Therapy: Non-Curative / Palliative Intent, Discussed with Patient

## 2022-06-19 ENCOUNTER — Ambulatory Visit
Admission: RE | Admit: 2022-06-19 | Discharge: 2022-06-19 | Disposition: A | Discharge status: Home / Self Care | Payer: PPO | Source: Ambulatory Visit | Attending: Radiation Oncology | Admitting: Radiation Oncology

## 2022-06-19 DIAGNOSIS — Z510 Encounter for antineoplastic radiation therapy: Secondary | ICD-10-CM | POA: Diagnosis not present

## 2022-06-19 DIAGNOSIS — C3411 Malignant neoplasm of upper lobe, right bronchus or lung: Secondary | ICD-10-CM | POA: Diagnosis not present

## 2022-06-19 DIAGNOSIS — Z87891 Personal history of nicotine dependence: Secondary | ICD-10-CM | POA: Diagnosis not present

## 2022-06-19 DIAGNOSIS — C7951 Secondary malignant neoplasm of bone: Secondary | ICD-10-CM | POA: Diagnosis not present

## 2022-06-20 ENCOUNTER — Other Ambulatory Visit: Payer: Self-pay | Admitting: *Deleted

## 2022-06-20 DIAGNOSIS — C7951 Secondary malignant neoplasm of bone: Secondary | ICD-10-CM

## 2022-06-21 ENCOUNTER — Telehealth: Payer: Self-pay | Admitting: Internal Medicine

## 2022-06-21 MED ORDER — FENTANYL 12 MCG/HR TD PT72: 1.0000 | MEDICATED_PATCH | TRANSDERMAL | 0 refills | Status: AC

## 2022-06-21 NOTE — Telephone Encounter (Signed)
On call page received-   Patient's son called. She is having pain in the left side of the chest on taking breath. Started today. Does not have pulse ox at home. Pt is able to speak in full sentences and breathing is not labored. Denies any changes in her breathing from baseline.   CT chest was done 4 days ago with contrast showing extensive metastatic disease which could be causing pain.   She is on oxycodone 10 mg q4. Fentanyl patch was prescribed by Dr. Smith Robert but patient did not need it. Tonight, she will increase oxycodone to every 3 hours. Resent script for fentanyl 12 mcg patch. She will get it tomorrow.   Advised that if pain is still uncontrolled or if breathing worsens should be evaluated in ED to rule PE. Expressed understanding and agreeable to plan. Also they knows they can call me back with any further concerns or changes in status.

## 2022-06-23 DIAGNOSIS — C3411 Malignant neoplasm of upper lobe, right bronchus or lung: Secondary | ICD-10-CM | POA: Diagnosis not present

## 2022-06-23 DIAGNOSIS — Z87891 Personal history of nicotine dependence: Secondary | ICD-10-CM | POA: Diagnosis not present

## 2022-06-23 DIAGNOSIS — Z510 Encounter for antineoplastic radiation therapy: Secondary | ICD-10-CM | POA: Diagnosis not present

## 2022-06-23 DIAGNOSIS — C7951 Secondary malignant neoplasm of bone: Secondary | ICD-10-CM | POA: Diagnosis not present

## 2022-06-24 MED FILL — Dexamethasone Sodium Phosphate Inj 100 MG/10ML: INTRAMUSCULAR | Qty: 1 | Status: AC

## 2022-06-25 ENCOUNTER — Inpatient Hospital Stay: Payer: PPO

## 2022-06-25 ENCOUNTER — Encounter: Payer: Self-pay | Admitting: Hospice and Palliative Medicine

## 2022-06-25 ENCOUNTER — Ambulatory Visit
Admission: RE | Admit: 2022-06-25 | Discharge: 2022-06-25 | Disposition: A | Discharge status: Home / Self Care | Payer: PPO | Source: Ambulatory Visit | Attending: Radiation Oncology | Admitting: Radiation Oncology

## 2022-06-25 ENCOUNTER — Ambulatory Visit: Payer: PPO | Admitting: Hospice and Palliative Medicine

## 2022-06-25 ENCOUNTER — Ambulatory Visit
Admission: RE | Admit: 2022-06-25 | Discharge: 2022-06-25 | Disposition: A | Discharge status: Home / Self Care | Payer: PPO | Source: Ambulatory Visit | Attending: Hospice and Palliative Medicine | Admitting: Hospice and Palliative Medicine

## 2022-06-25 ENCOUNTER — Other Ambulatory Visit: Payer: Self-pay

## 2022-06-25 ENCOUNTER — Other Ambulatory Visit: Payer: PPO

## 2022-06-25 ENCOUNTER — Other Ambulatory Visit: Payer: Self-pay | Admitting: *Deleted

## 2022-06-25 ENCOUNTER — Inpatient Hospital Stay (HOSPITAL_BASED_OUTPATIENT_CLINIC_OR_DEPARTMENT_OTHER): Payer: PPO | Admitting: Hospice and Palliative Medicine

## 2022-06-25 ENCOUNTER — Ambulatory Visit: Payer: PPO

## 2022-06-25 VITALS — BP 124/66 | HR 125 | Temp 97.6°F | Resp 20 | Ht 61.0 in | Wt 156.0 lb

## 2022-06-25 DIAGNOSIS — C3490 Malignant neoplasm of unspecified part of unspecified bronchus or lung: Secondary | ICD-10-CM

## 2022-06-25 DIAGNOSIS — R0602 Shortness of breath: Secondary | ICD-10-CM

## 2022-06-25 DIAGNOSIS — C7951 Secondary malignant neoplasm of bone: Secondary | ICD-10-CM

## 2022-06-25 DIAGNOSIS — Z510 Encounter for antineoplastic radiation therapy: Secondary | ICD-10-CM | POA: Diagnosis not present

## 2022-06-25 DIAGNOSIS — R918 Other nonspecific abnormal finding of lung field: Secondary | ICD-10-CM | POA: Diagnosis not present

## 2022-06-25 DIAGNOSIS — E876 Hypokalemia: Secondary | ICD-10-CM

## 2022-06-25 DIAGNOSIS — E860 Dehydration: Secondary | ICD-10-CM

## 2022-06-25 DIAGNOSIS — Z87891 Personal history of nicotine dependence: Secondary | ICD-10-CM | POA: Diagnosis not present

## 2022-06-25 DIAGNOSIS — J90 Pleural effusion, not elsewhere classified: Secondary | ICD-10-CM | POA: Diagnosis not present

## 2022-06-25 DIAGNOSIS — C3411 Malignant neoplasm of upper lobe, right bronchus or lung: Secondary | ICD-10-CM | POA: Diagnosis not present

## 2022-06-25 LAB — CBC WITH DIFFERENTIAL/PLATELET
Abs Immature Granulocytes: 0.36 10*3/uL — ABNORMAL HIGH (ref 0.00–0.07)
Basophils Absolute: 0.1 10*3/uL (ref 0.0–0.1)
Basophils Relative: 1 %
Eosinophils Absolute: 0 10*3/uL (ref 0.0–0.5)
Eosinophils Relative: 0 %
HCT: 31.4 % — ABNORMAL LOW (ref 36.0–46.0)
Hemoglobin: 9.9 g/dL — ABNORMAL LOW (ref 12.0–15.0)
Immature Granulocytes: 2 %
Lymphocytes Relative: 6 %
Lymphs Abs: 1 10*3/uL (ref 0.7–4.0)
MCH: 29.4 pg (ref 26.0–34.0)
MCHC: 31.5 g/dL (ref 30.0–36.0)
MCV: 93.2 fL (ref 80.0–100.0)
Monocytes Absolute: 1.6 10*3/uL — ABNORMAL HIGH (ref 0.1–1.0)
Monocytes Relative: 10 %
Neutro Abs: 12.8 10*3/uL — ABNORMAL HIGH (ref 1.7–7.7)
Neutrophils Relative %: 81 %
Platelets: 308 10*3/uL (ref 150–400)
RBC: 3.37 MIL/uL — ABNORMAL LOW (ref 3.87–5.11)
RDW: 16.3 % — ABNORMAL HIGH (ref 11.5–15.5)
WBC: 15.9 10*3/uL — ABNORMAL HIGH (ref 4.0–10.5)
nRBC: 0 % (ref 0.0–0.2)

## 2022-06-25 LAB — COMPREHENSIVE METABOLIC PANEL
ALT: 11 U/L (ref 0–44)
AST: 19 U/L (ref 15–41)
Albumin: 2.7 g/dL — ABNORMAL LOW (ref 3.5–5.0)
Alkaline Phosphatase: 98 U/L (ref 38–126)
Anion gap: 13 (ref 5–15)
BUN: 11 mg/dL (ref 8–23)
CO2: 23 mmol/L (ref 22–32)
Calcium: 8.3 mg/dL — ABNORMAL LOW (ref 8.9–10.3)
Chloride: 100 mmol/L (ref 98–111)
Creatinine, Ser: 1.29 mg/dL — ABNORMAL HIGH (ref 0.44–1.00)
GFR, Estimated: 44 mL/min — ABNORMAL LOW (ref >60)
Glucose, Bld: 144 mg/dL — ABNORMAL HIGH (ref 70–99)
Potassium: 3.2 mmol/L — ABNORMAL LOW (ref 3.5–5.1)
Sodium: 136 mmol/L (ref 135–145)
Total Bilirubin: 0.6 mg/dL (ref 0.3–1.2)
Total Protein: 7.5 g/dL (ref 6.5–8.1)

## 2022-06-25 MED ORDER — SODIUM CHLORIDE 0.9% FLUSH
10.0000 mL | Freq: Once | INTRAVENOUS | Status: AC | PRN
  Administered 2022-06-25: 10 mL
  Filled 2022-06-25: qty 10

## 2022-06-25 MED ORDER — OXYCODONE HCL 5 MG PO TABS: 5.0000 mg | ORAL_TABLET | ORAL | 0 refills | Status: AC | PRN

## 2022-06-25 MED ORDER — HEPARIN SOD (PORK) LOCK FLUSH 100 UNIT/ML IV SOLN
500.0000 [IU] | Freq: Once | INTRAVENOUS | Status: AC | PRN
  Administered 2022-06-25: 500 [IU]
  Filled 2022-06-25: qty 5

## 2022-06-25 MED ORDER — POTASSIUM CHLORIDE 20 MEQ/100ML IV SOLN
20.0000 meq | Freq: Once | INTRAVENOUS | Status: AC
  Administered 2022-06-25: 20 meq via INTRAVENOUS

## 2022-06-25 MED ORDER — SODIUM CHLORIDE 0.9 % IV SOLN
INTRAVENOUS | Status: DC
  Filled 2022-06-25 (×2): qty 250

## 2022-06-25 NOTE — Progress Notes (Signed)
Symptom Management [facility] at [facility] Telephone:([phone]) [phone] Fax:([phone]) [phone]  Patient Care Team: Sherlene Shams, MD as PCP - General (Internal Medicine) Sherlene Shams, MD (Internal Medicine) Lemar Livings, Merrily Pew, MD (General Surgery) Glory Buff, RN as Oncology Nurse Navigator   NAME OF PATIENT: Lorraine Mills  [id number]  [birth year]   DATE OF VISIT: 06/25/22  REASON FOR CONSULT: Lorraine Mills is a 72 y.o. 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  Patient is on systemic chemo with [place] Alimta and Keytruda.  INTERVAL HISTORY: Recent CT chest, abdomen, pelvis showed significant disease progression after 3 cycles of carbo Alimta Keytruda chemotherapy.  There was worsening lung disease with mediastinal adenopathy encroachment of SVC.  Patient was referred to radiation oncology.  Patient saw Dr. Smith Robert on 06/18/2022 with plan to initiate weekly CarboTaxol chemotherapy.  Following completion of radiation, plan is to give 2-3 additional cycles of CarboTaxol Avastin chemotherapy.  Today, patient says that she feels about the same.  She denies any significant changes or concerns.  She denies chest pain.  She has some low back pain, which is chronic for her.  She denies worsening shortness of breath remains chronically on 2 L O2.  Denies any neurologic complaints. Denies recent fevers or illnesses. Denies any easy bleeding or bruising. Reports poor appetite. Denies chest pain. Denies any vomiting, constipation, or diarrhea. Denies urinary complaints. Patient offers no further specific complaints today.  PAST MEDICAL HISTORY: Past Medical History:  Diagnosis Date   Anxiety    Arthritis    COPD (chronic obstructive pulmonary disease) (HCC)    Depression 01/31/2016   Goiter    Hyperlipidemia    TGs   Hypothyroidism    Lung cancer (HCC)    Seasonal  allergies     PAST SURGICAL HISTORY:  Past Surgical History:  Procedure Laterality Date   ABDOMINAL HYSTERECTOMY     supracervical    BILATERAL OOPHORECTOMY  2001   BENIGN TUMOR   BREAST BIOPSY Left 1985   cyst    BRONCHIAL NEEDLE ASPIRATION BIOPSY  03/25/2022   Procedure: BRONCHIAL NEEDLE ASPIRATION BIOPSIES;  Surgeon: Karren Burly, MD;  Location: [facility];  Service: Endoscopy;;   CATARACT EXTRACTION Right    COLONOSCOPY  08/17/2009   COLONOSCOPY WITH PROPOFOL N/A 03/19/2016   Procedure: COLONOSCOPY WITH PROPOFOL;  Surgeon: Earline Mayotte, MD;  Location: [facility];  Service: Endoscopy;  Laterality: N/A;   COLONOSCOPY WITH PROPOFOL N/A 05/15/2021   Procedure: COLONOSCOPY WITH PROPOFOL;  Surgeon: Earline Mayotte, MD;  Location: [facility];  Service: Endoscopy;  Laterality: N/A;   CORNEAL TRANSPLANT Right    ENDOBRONCHIAL ULTRASOUND N/A 03/25/2022   Procedure: ENDOBRONCHIAL ULTRASOUND;  Surgeon: Karren Burly, MD;  Location: [facility];  Service: Endoscopy;  Laterality: N/A;   EYE SURGERY     IR IMAGING GUIDED PORT INSERTION  03/26/2022   JOINT REPLACEMENT     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   Procedure: TOTAL HIP ARTHROPLASTY ANTERIOR APPROACH;  Surgeon: Kennedy Bucker, MD;  Location: [facility];  Service: Orthopedics;  Laterality: Right;   VIDEO BRONCHOSCOPY  03/25/2022   Procedure: VIDEO BRONCHOSCOPY WITHOUT FLUORO;  Surgeon: Karren Burly, MD;  Location: [facility];  Service: Endoscopy;;    HEMATOLOGY/ONCOLOGY HISTORY:  Oncology History  Lung cancer metastatic to bone [facility])  03/21/2022 Initial Diagnosis   Lung cancer metastatic to bone (HCC)  04/03/2022 Cancer Staging   Staging form: Lung, AJCC 8th Edition - Clinical stage from 04/03/2022: Stage IVB (cT1a, cN2, cM1c) - Signed by Creig Hines, MD on 04/03/2022   04/09/2022 - 05/28/2022 Chemotherapy   Patient is on Treatment Plan : LUNG NSCLC Pemetrexed + Carboplatin q21d x  4 Cycles     06/25/2022 -  Chemotherapy   Patient is on Treatment Plan : NSCLC Carboplatin + Paclitaxel Weekly X 6 Weeks with XRT       ALLERGIES:  is allergic to latex and levofloxacin.  MEDICATIONS:  Current Outpatient Medications  Medication Sig Dispense Refill   acetaminophen (TYLENOL) 500 MG tablet Take 1,000 mg by mouth every 6 (six) hours as needed for moderate pain.     albuterol (PROVENTIL) (2.5 MG/3ML) 0.083% nebulizer solution Inhale into the lungs. (Patient not taking: Reported on 06/18/2022)     albuterol (VENTOLIN HFA) 108 (90 Base) MCG/ACT inhaler Inhale 2 puffs into the lungs every 6 (six) hours as needed for wheezing or shortness of breath. 8 g 2   ALPRAZolam (XANAX) 0.5 MG tablet Take 1 tablet (0.5 mg total) by mouth 2 (two) times daily as needed for anxiety. 60 tablet 2   fenofibrate micronized (LOFIBRA) 134 MG capsule Take 1 capsule (134 mg total) by mouth daily before breakfast. 30 capsule 11   fentaNYL (DURAGESIC) 12 MCG/HR Place 1 patch onto the skin every 3 (three) days. 10 patch 0   levothyroxine (SYNTHROID) 75 MCG tablet TAKE 1 TABLET EVERY DAY ON EMPTY STOMACHWITH A GLASS OF WATER AT LEAST 30-60 MINBEFORE BREAKFAST (Patient taking differently: Take 75 mcg by mouth daily before breakfast. On an empty stomach with a glass of water at least 30-60 minutes before breakfast.) 90 tablet 1   mirtazapine (REMERON) 15 MG tablet Take 1 tablet (15 mg total) by mouth at bedtime. 30 tablet 1   OLANZapine (ZYPREXA) 10 MG tablet Take 1 tablet (10 mg total) by mouth at bedtime. (Patient not taking: Reported on 06/18/2022) 30 tablet 2   omeprazole (PRILOSEC) 20 MG capsule TAKE 1 CAPSULE BY MOUTH DAILY (Patient taking differently: Take 20 mg by mouth daily.) 30 capsule 3   ondansetron (ZOFRAN) 4 MG tablet Take 1 tablet (4 mg total) by mouth every 6 (six) hours as needed for nausea. 30 tablet 1   oxyCODONE (OXY IR/ROXICODONE) 5 MG immediate release tablet Take 1-2 tablets (5-10 mg total)  by mouth every 4 (four) hours as needed (pain). 60 tablet 0   potassium chloride SA (KLOR-CON M) 20 MEQ tablet Take 1 tablet (20 mEq total) by mouth daily. 14 tablet 0   prednisoLONE acetate (PRED FORTE) 1 % ophthalmic suspension Place 1 drop into the right eye daily.     promethazine (PHENERGAN) 12.5 MG tablet Take 1 tablet (12.5 mg total) by mouth every 8 (eight) hours as needed for nausea or vomiting. (Patient not taking: Reported on 04/09/2022) 30 tablet 0   RESTASIS 0.05 % ophthalmic emulsion Place 1 drop into both eyes 2 (two) times daily.     rosuvastatin (CRESTOR) 10 MG tablet TAKE ONE TABLET EVERY DAY. (Patient taking differently: Take 10 mg by mouth daily.) 90 tablet 3   No current facility-administered medications for this visit.    VITAL SIGNS: There were no vitals taken for this visit. There were no vitals filed for this visit.   Estimated body mass index is 30.16 kg/m as calculated from the following:   Height as of 06/16/22: 5\' 1"  (1.549 m).  Weight as of 06/16/22: 159 lb 9.6 oz (72.4 kg).  LABS: CBC:    Component Value Date/Time   WBC 15.9 (H) 06/25/2022 0803   HGB 9.9 (L) 06/25/2022 0803   HCT 31.4 (L) 06/25/2022 0803   PLT 308 06/25/2022 0803   MCV 93.2 06/25/2022 0803   NEUTROABS 12.8 (H) 06/25/2022 0803   LYMPHSABS 1.0 06/25/2022 0803   MONOABS 1.6 (H) 06/25/2022 0803   EOSABS 0.0 06/25/2022 0803   BASOSABS 0.1 06/25/2022 0803   Comprehensive Metabolic Panel:    Component Value Date/Time   NA 139 06/18/2022 1009   NA 142 10/02/2020 0938   K 4.3 06/18/2022 1009   CL 105 06/18/2022 1009   CO2 25 06/18/2022 1009   BUN 9 06/18/2022 1009   BUN 22 10/02/2020 0938   CREATININE 1.16 (H) 06/18/2022 1009   CREATININE 0.95 01/29/2016 1429   GLUCOSE 120 (H) 06/18/2022 1009   CALCIUM 8.7 (L) 06/18/2022 1009   AST 22 06/18/2022 1009   ALT 13 06/18/2022 1009   ALKPHOS 95 06/18/2022 1009   BILITOT 0.1 (L) 06/18/2022 1009   PROT 6.7 06/18/2022 1009   ALBUMIN 2.8  (L) 06/18/2022 1009    RADIOGRAPHIC STUDIES: CT CHEST ABDOMEN PELVIS W CONTRAST  Result Date: 06/17/2022 CLINICAL DATA:  Metastatic lung cancer. Current chemotherapy patient. Shortness of breath. * Tracking Code: BO * EXAM: CT CHEST, ABDOMEN, AND PELVIS WITH CONTRAST TECHNIQUE: Multidetector CT imaging of the chest, abdomen and pelvis was performed following the standard protocol during bolus administration of intravenous contrast. RADIATION DOSE REDUCTION: This exam was performed according to the departmental dose-optimization program which includes automated exposure control, adjustment of the mA and/or kV according to patient size and/or use of iterative reconstruction technique. CONTRAST:  OMNIPAQUE IOHEXOL 300 MG/ML  SOLN COMPARISON:  03/20/2022 FINDINGS: CT CHEST FINDINGS Cardiovascular: Right Port-A-Cath tip: Lower SVC. Progressive right hilar and mediastinal mass causes worsened narrowing of the right upper lobe and right lower lobe pulmonary arteries (image 22 and 26 of series 3, respectively) and increased encroachment and extrinsic compression of the lower SVC (image 24, series 3). Aortic arch atherosclerotic vascular disease.  Mild cardiomegaly. Mediastinum/Nodes: As noted above, there is progressive confluent right hilar and mediastinal adenopathy contributing to narrowing of the trachea at the carina (image 19, series 3) and narrowing of the bronchus intermedius (image 24, series 3). Tracheomalacia may also be contributing to the tracheal narrowing given the somewhat slit-like cross-sectional contour of the lower trachea. Overall central right tumor measures about 5.1 by 5.7 cm on image 24 series 3, previously this was made up of individual nodes measuring up to about 2.3 cm individually. A right lower paratracheal node measuring 1.9 cm in short axis on image 19 series 3 previously measured about 2.0 cm. A right internal mammary lymph node currently measures 1.9 cm in short axis on image 29  series 3 (formerly 1.1 cm) and invades the third intercostal space. Lungs/Pleura: Small right pleural effusion with possible tethering of the visceral and parietal pleura on the right on image 27 of series 3. Trace left pleural effusion. Marked airway narrowing and plugging in the right upper lobe with secondary pulmonary lobular septal thickening and ground-glass opacities throughout much of the right upper lobe. Substantial atelectasis in the right middle lobe and right lower lobe with marked airway narrowing. On the left side there is a somewhat lesser degree of secondary pulmonary lobular septal thickening but with scattered ground-glass pulmonary nodularity and scattered additional potential metastatic  nodularity. This includes a new 1.2 by 1.0 cm nodule anteriorly in the left upper lobe on image 51 series 4 and scattered smaller nodules along the left pleural surfaces, in the left upper lobe, and left lower lobe. Musculoskeletal: Progressive lytic destruction of the right fourth rib, with a new soft tissue mass component measuring 3.0 by 1.6 cm in the right chest wall on image 24 series 3. Probably pathologic fracture of the right eleventh rib posteriorly on image 48 of series 3 is new and associated with an abnormal rib lucency which is likewise new. Small lytic lesion of the left posterior sternal manubrium adjacent to the sternoclavicular joint. Lytic lesion in the right distal clavicle. Lucent lesions at T7, T9, and T11 in the thoracic spine compatible with new thoracic spine metastatic lesions. There is also some left posterior vertebral body and pedicle lytic involvement at the T7 vertebra. Right lower axillary lymph node or posterior breast nodule is enhancing and measures 1.3 cm in diameter on image 39 of series 3, new compared to the prior exam likely representing malignancy. A new small enhancing nodule in the left lower breast measures 0.7 cm in diameter on image 41 series 3. Subcutaneous nodule  posterior to the left latissimus dorsi muscle measuring 1.2 by 0.8 cm on image 30 series 3, new compared to the prior exam and suspicious for subcutaneous metastatic lesion. CT ABDOMEN PELVIS FINDINGS Hepatobiliary: Unremarkable Pancreas: Unremarkable Spleen: There are 2-3 new small hypodense lesions in the spleen which are technically nonspecific but given the clinical context raise some concern for potential early metastatic disease. A 0.7 cm lesion in the lower spleen on image 52 of series 3 represents an index lesion. Adrenals/Urinary Tract: Right adrenal mass 3.3 by 1.6 cm on image 47 series 3, formerly 3.7 by 2.0 cm. Appearance suspicious for malignancy but mildly improved from previous. Kidneys unremarkable. Stomach/Bowel: Sigmoid colon diverticulosis. Vascular/Lymphatic: Atherosclerosis is present, including aortoiliac atherosclerotic disease. Reproductive: Partially obscured by streak artifact from the right hip implant. Suspected hysterectomy. Other: New 0.8 by 0.5 cm soft tissue nodule along the lower omentum, image 98 of series 3. New right lower quadrant omental nodule, 0.9 by 0.8 cm on image 88 series 3. Musculoskeletal: Lytic metastatic lesions particularly at L1, L4, L5, in the right iliac bone (image 130, series 5), and in the left iliac bone (image 95, series 5). These are new and/or substantially enlarged compared to previous. New soft tissue mass in the left gluteus maximus muscle, 2.4 by 1.6 cm on image 98 series 3, compatible with metastatic lesion. 0.8 cm nodule along the superficial fascia margin of the gluteal musculature on the right on image 93 series 3. Multifocal rim enhancing nodularity in the lumbar paraspinal musculature, including a 1.1 cm in diameter lesion in the left paraspinal musculature on image 61 series 3, compatible with additional intramuscular metastatic lesions. IMPRESSION: 1. Significant progression of malignancy, with enlargement of the right hilar and mediastinal mass,  progressive confluent right hilar and mediastinal adenopathy, new bilateral pulmonary nodules, new bilateral pleural effusions, new subcutaneous and intramuscular metastatic lesions, and new and/or enlarging lytic osseous metastatic lesions. 2. There is also increased narrowing of the trachea at the carina and narrowing of the bronchus intermedius due to extrinsic compression from the right hilar and mediastinal mass. There is also considerable airway narrowing and plugging in the right upper lobe. 3. New small hypodense lesions in the spleen are technically nonspecific but raise the possibility of early metastatic disease. 4. Reduced size  of the right adrenal mass, currently 3.3 by 1.6 cm, previously 3.7 by 2.0 cm. 5. New small right and trace left pleural effusions. The right pleural effusion may be partially loculated and there is some tethering of the visceral and parietal pleura on the right. Trace left pleural effusion. 6. Progressive lytic involvement of the right fourth rib and right eleventh rib. 7. New small soft tissue nodules in the subcutaneous tissues posterior to the left latissimus dorsi muscle and in the right gluteal musculature compatible with subcutaneous metastatic lesions. New small enhancing inferior breast nodules, likely metastatic. 8. Aortic atherosclerosis. Aortic Atherosclerosis (ICD10-I70.0). Electronically Signed   By: Gaylyn Rong M.D.   On: 06/17/2022 19:45   DG Sniff Test  Result Date: 06/04/2022 CLINICAL DATA:  Shortness of breath, right lung cancer EXAM: CHEST FLUOROSCOPY TECHNIQUE: Real-time fluoroscopic evaluation of the chest was performed. FLUOROSCOPY: Radiation Exposure Index (as provided by the fluoroscopic device): 2.9 mGy COMPARISON:  CT chest 03/20/2022 FINDINGS: Real-time fluoroscopy of the diaphragms was performed during inspiration, expiration and sniffing. Elevation of the right diaphragm. Right basilar atelectasis. No significant diaphragmatic motion during  inspiration, expiration or sniffing. Normal left diaphragmatic motion during inspiration, expiration and sniffing. IMPRESSION: 1. No significant right diaphragmatic motion during inspiration, expiration or sniffing consistent with right diaphragmatic paralysis. Electronically Signed   By: Elige Ko M.D.   On: 06/04/2022 12:38    PERFORMANCE STATUS (ECOG) : 2 - Symptomatic, <50% confined to bed  Review of Systems Unless otherwise noted, a complete review of systems is negative.  Physical Exam General: NAD Cardiovascular: regular rate and rhythm Pulmonary: clear ant fields, diminished right lung Abdomen: soft, nontender, + bowel sounds GU: no suprapubic tenderness Extremities: no edema, no joint deformities Skin: no rashes Neurological: Weakness but otherwise nonfocal  IMPRESSION/PLAN: Stage IVa mucinous adenocarcinoma  -progression noted on recent CT.  Patient was not interested in hospice and desired further treatment and therefore was rotated concurrent XRT and weekly CarboTaxol chemotherapy.  Patient was noted to be tachycardic today.  Also with leukocytosis.  Discussed with Dr. Alena Bills who was covering for Dr. Smith Robert. Dr. Alena Bills advised against proceeding with chemotherapy today and recommended obtaining chest x-ray to rule out pneumonia.  Shortness of breath-chronic and secondary to rapidly progressive lung cancer.  Reviewed ED triggers with patient and husband in detail.  Hypokalemia -patient says she is unable to take her oral potassium supplement.  Will proceed with gentle fluids and replete with IV KCl.  Neoplasm related pain -continue fentanyl/oxycodone  Goals of care -I again reviewed the option of less aggressive measures and focusing on quality of life versus quantity of life.  Performance status is poor.  At baseline, she is chair bound due to chronic dyspnea/fatigue.  Patient states clearly that she does not feel she is ready for hospice and wants to continue cancer  treatment.  Encouraged patient and husband to consider end-of-life decision-making and strongly recommended DNR/DNI.  I sent patient home with a MOST and ACP documents to review.  RTC later this week   Patient expressed understanding and was in agreement with this plan. She also understands that She can call clinic at any time with any questions, concerns, or complaints.   Thank you for allowing me to participate in the care of this very pleasant patient.   Time Total: 25 minutes  Visit consisted of counseling and education dealing with the complex and emotionally intense issues of symptom management in the setting of serious illness.Greater than 50%  of this time was spent counseling and coordinating care related to the above assessment and plan.  Signed by: Altha Harm, PhD, NP-C

## 2022-06-25 NOTE — Patient Instructions (Signed)

## 2022-06-26 ENCOUNTER — Other Ambulatory Visit: Payer: Self-pay

## 2022-06-26 ENCOUNTER — Ambulatory Visit
Admission: RE | Admit: 2022-06-26 | Discharge: 2022-06-26 | Disposition: A | Discharge status: Home / Self Care | Payer: PPO | Source: Ambulatory Visit | Attending: Radiation Oncology | Admitting: Radiation Oncology

## 2022-06-26 DIAGNOSIS — Z87891 Personal history of nicotine dependence: Secondary | ICD-10-CM | POA: Diagnosis not present

## 2022-06-26 DIAGNOSIS — C7951 Secondary malignant neoplasm of bone: Secondary | ICD-10-CM | POA: Diagnosis not present

## 2022-06-26 DIAGNOSIS — Z510 Encounter for antineoplastic radiation therapy: Secondary | ICD-10-CM | POA: Diagnosis not present

## 2022-06-26 DIAGNOSIS — C3411 Malignant neoplasm of upper lobe, right bronchus or lung: Secondary | ICD-10-CM | POA: Diagnosis not present

## 2022-06-26 LAB — RAD ONC ARIA SESSION SUMMARY
Course Elapsed Days: 0
Plan Fractions Treated to Date: 1
Plan Prescribed Dose Per Fraction: 2 Gy
Plan Total Fractions Prescribed: 20
Plan Total Prescribed Dose: 40 Gy
Reference Point Dosage Given to Date: 2 Gy
Reference Point Session Dosage Given: 2 Gy
Session Number: 1

## 2022-06-27 ENCOUNTER — Inpatient Hospital Stay (HOSPITAL_BASED_OUTPATIENT_CLINIC_OR_DEPARTMENT_OTHER): Payer: PPO | Admitting: Hospice and Palliative Medicine

## 2022-06-27 ENCOUNTER — Encounter: Payer: Self-pay | Admitting: Hospice and Palliative Medicine

## 2022-06-27 ENCOUNTER — Other Ambulatory Visit: Payer: Self-pay

## 2022-06-27 ENCOUNTER — Inpatient Hospital Stay: Payer: PPO

## 2022-06-27 ENCOUNTER — Ambulatory Visit
Admission: RE | Admit: 2022-06-27 | Discharge: 2022-06-27 | Disposition: A | Discharge status: Home / Self Care | Payer: PPO | Source: Ambulatory Visit | Attending: Radiation Oncology | Admitting: Radiation Oncology

## 2022-06-27 VITALS — BP 146/78 | HR 123 | Temp 97.0°F | Resp 22 | Ht 61.0 in | Wt 148.0 lb

## 2022-06-27 DIAGNOSIS — E876 Hypokalemia: Secondary | ICD-10-CM

## 2022-06-27 DIAGNOSIS — C3411 Malignant neoplasm of upper lobe, right bronchus or lung: Secondary | ICD-10-CM | POA: Diagnosis not present

## 2022-06-27 DIAGNOSIS — C3490 Malignant neoplasm of unspecified part of unspecified bronchus or lung: Secondary | ICD-10-CM

## 2022-06-27 DIAGNOSIS — Z515 Encounter for palliative care: Secondary | ICD-10-CM | POA: Diagnosis not present

## 2022-06-27 DIAGNOSIS — Z87891 Personal history of nicotine dependence: Secondary | ICD-10-CM | POA: Diagnosis not present

## 2022-06-27 DIAGNOSIS — E860 Dehydration: Secondary | ICD-10-CM

## 2022-06-27 DIAGNOSIS — C7951 Secondary malignant neoplasm of bone: Secondary | ICD-10-CM | POA: Diagnosis not present

## 2022-06-27 DIAGNOSIS — Z95828 Presence of other vascular implants and grafts: Secondary | ICD-10-CM

## 2022-06-27 DIAGNOSIS — Z510 Encounter for antineoplastic radiation therapy: Secondary | ICD-10-CM | POA: Diagnosis not present

## 2022-06-27 LAB — RAD ONC ARIA SESSION SUMMARY
Course Elapsed Days: 1
Plan Fractions Treated to Date: 2
Plan Prescribed Dose Per Fraction: 2 Gy
Plan Total Fractions Prescribed: 20
Plan Total Prescribed Dose: 40 Gy
Reference Point Dosage Given to Date: 4 Gy
Reference Point Session Dosage Given: 2 Gy
Session Number: 2

## 2022-06-27 LAB — COMPREHENSIVE METABOLIC PANEL
ALT: 10 U/L (ref 0–44)
AST: 20 U/L (ref 15–41)
Albumin: 2.7 g/dL — ABNORMAL LOW (ref 3.5–5.0)
Alkaline Phosphatase: 102 U/L (ref 38–126)
Anion gap: 10 (ref 5–15)
BUN: 14 mg/dL (ref 8–23)
CO2: 24 mmol/L (ref 22–32)
Calcium: 8.2 mg/dL — ABNORMAL LOW (ref 8.9–10.3)
Chloride: 105 mmol/L (ref 98–111)
Creatinine, Ser: 1.48 mg/dL — ABNORMAL HIGH (ref 0.44–1.00)
GFR, Estimated: 38 mL/min — ABNORMAL LOW (ref >60)
Glucose, Bld: 152 mg/dL — ABNORMAL HIGH (ref 70–99)
Potassium: 3.2 mmol/L — ABNORMAL LOW (ref 3.5–5.1)
Sodium: 139 mmol/L (ref 135–145)
Total Bilirubin: 0.8 mg/dL (ref 0.3–1.2)
Total Protein: 7.4 g/dL (ref 6.5–8.1)

## 2022-06-27 LAB — CBC WITH DIFFERENTIAL/PLATELET
Abs Immature Granulocytes: 0.31 10*3/uL — ABNORMAL HIGH (ref 0.00–0.07)
Basophils Absolute: 0.1 10*3/uL (ref 0.0–0.1)
Basophils Relative: 1 %
Eosinophils Absolute: 0 10*3/uL (ref 0.0–0.5)
Eosinophils Relative: 0 %
HCT: 30.3 % — ABNORMAL LOW (ref 36.0–46.0)
Hemoglobin: 9.5 g/dL — ABNORMAL LOW (ref 12.0–15.0)
Immature Granulocytes: 2 %
Lymphocytes Relative: 5 %
Lymphs Abs: 0.8 10*3/uL (ref 0.7–4.0)
MCH: 29.2 pg (ref 26.0–34.0)
MCHC: 31.4 g/dL (ref 30.0–36.0)
MCV: 93.2 fL (ref 80.0–100.0)
Monocytes Absolute: 1.5 10*3/uL — ABNORMAL HIGH (ref 0.1–1.0)
Monocytes Relative: 9 %
Neutro Abs: 13.6 10*3/uL — ABNORMAL HIGH (ref 1.7–7.7)
Neutrophils Relative %: 83 %
Platelets: 222 10*3/uL (ref 150–400)
RBC: 3.25 MIL/uL — ABNORMAL LOW (ref 3.87–5.11)
RDW: 16.6 % — ABNORMAL HIGH (ref 11.5–15.5)
WBC: 16.4 10*3/uL — ABNORMAL HIGH (ref 4.0–10.5)
nRBC: 0 % (ref 0.0–0.2)

## 2022-06-27 LAB — MAGNESIUM: Magnesium: 2 mg/dL (ref 1.7–2.4)

## 2022-06-27 MED ORDER — POTASSIUM CHLORIDE 20 MEQ/100ML IV SOLN
20.0000 meq | Freq: Once | INTRAVENOUS | Status: AC
  Administered 2022-06-27: 20 meq via INTRAVENOUS

## 2022-06-27 MED ORDER — SODIUM CHLORIDE 0.9% FLUSH
10.0000 mL | Freq: Once | INTRAVENOUS | Status: AC
  Administered 2022-06-27: 10 mL via INTRAVENOUS
  Filled 2022-06-27: qty 10

## 2022-06-27 MED ORDER — POTASSIUM CHLORIDE CRYS ER 10 MEQ PO TBCR: 10.0000 meq | EXTENDED_RELEASE_TABLET | Freq: Every day | ORAL | 0 refills | Status: AC

## 2022-06-27 MED ORDER — SODIUM CHLORIDE 0.9 % IV SOLN
INTRAVENOUS | Status: DC
  Filled 2022-06-27 (×2): qty 250

## 2022-06-27 MED ORDER — HEPARIN SOD (PORK) LOCK FLUSH 100 UNIT/ML IV SOLN
500.0000 [IU] | Freq: Once | INTRAVENOUS | Status: AC
  Administered 2022-06-27: 500 [IU]
  Filled 2022-06-27: qty 5

## 2022-06-27 NOTE — Progress Notes (Signed)
[facility] at [facility] Telephone:([phone] [phone]) [phone]  Patient Care Team: Sherlene Shams, MD as PCP - General (Internal Medicine) Sherlene Shams, MD (Internal Medicine) Lemar Livings, Merrily Pew, MD (General Surgery) Glory Buff, RN as Oncology Nurse Navigator   NAME OF PATIENT: Lorraine Mills  [id number]  [birth year]   DATE OF VISIT: 06/27/22  REASON FOR CONSULT: HONOR FRISON is a 72 y.o. 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  Patient is on systemic chemo with [place] Alimta and Keytruda.  INTERVAL HISTORY: Recent CT chest, abdomen, pelvis showed significant disease progression after 3 cycles of carbo Alimta Keytruda chemotherapy.  There was worsening lung disease with mediastinal adenopathy encroachment of SVC.  Patient was referred to radiation oncology.  Patient saw Dr. Smith Robert on 06/18/2022 with plan to initiate weekly CarboTaxol chemotherapy.  Following completion of radiation, plan is to give 2-3 additional cycles of CarboTaxol Avastin chemotherapy.  Patient was seen on 06/25/2022 and was to start weekly CarboTaxol but treatment was held due to tachycardia.  Patient was sent for x-ray with findings of mild bilateral lung interstitial thickening, small right pleural effusion and inferior lateral pleural thickening consistent with prior CT.  Patient returns today for evaluation of symptoms.  Today, patient reports that she feels about the same.  No significant changes in symptoms or new symptomatic complaints today.  Denies any neurologic complaints. Denies recent fevers or illnesses. Denies any easy bleeding or bruising. Reports poor appetite. Denies chest pain. Denies any vomiting, constipation, or diarrhea. Denies urinary complaints. Patient offers no further specific complaints today.  PAST MEDICAL HISTORY: Past Medical  History:  Diagnosis Date   Anxiety    Arthritis    COPD (chronic obstructive pulmonary disease) (HCC)    Depression 01/31/2016   Goiter    Hyperlipidemia    TGs   Hypothyroidism    Lung cancer (HCC)    Seasonal allergies     PAST SURGICAL HISTORY:  Past Surgical History:  Procedure Laterality Date   ABDOMINAL HYSTERECTOMY     supracervical    BILATERAL OOPHORECTOMY  2001   BENIGN TUMOR   BREAST BIOPSY Left 1985   cyst    BRONCHIAL NEEDLE ASPIRATION BIOPSY  03/25/2022   Procedure: BRONCHIAL NEEDLE ASPIRATION BIOPSIES;  Surgeon: Karren Burly, MD;  Location: [facility];  Service: Endoscopy;;   CATARACT EXTRACTION Right    COLONOSCOPY  08/17/2009   COLONOSCOPY WITH PROPOFOL N/A 03/19/2016   Procedure: COLONOSCOPY WITH PROPOFOL;  Surgeon: Earline Mayotte, MD;  Location: [facility];  Service: Endoscopy;  Laterality: N/A;   COLONOSCOPY WITH PROPOFOL N/A 05/15/2021   Procedure: COLONOSCOPY WITH PROPOFOL;  Surgeon: Earline Mayotte, MD;  Location: [facility];  Service: Endoscopy;  Laterality: N/A;   CORNEAL TRANSPLANT Right    ENDOBRONCHIAL ULTRASOUND N/A 03/25/2022   Procedure: ENDOBRONCHIAL ULTRASOUND;  Surgeon: Karren Burly, MD;  Location: [facility];  Service: Endoscopy;  Laterality: N/A;   EYE SURGERY     IR IMAGING GUIDED PORT INSERTION  03/26/2022   JOINT REPLACEMENT     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   Procedure: TOTAL HIP ARTHROPLASTY ANTERIOR APPROACH;  Surgeon: Kennedy Bucker, MD;  Location: [facility];  Service: Orthopedics;  Laterality: Right;   VIDEO BRONCHOSCOPY  03/25/2022   Procedure: VIDEO BRONCHOSCOPY WITHOUT FLUORO;  Surgeon: Karren Burly, MD;  Location: [facility];  Service: Endoscopy;;  HEMATOLOGY/ONCOLOGY HISTORY:  Oncology History  Lung cancer metastatic to bone (HCC)  03/21/2022 Initial Diagnosis   Lung cancer metastatic to bone (HCC)   04/03/2022 Cancer Staging   Staging form: Lung, AJCC 8th Edition -  Clinical stage from 04/03/2022: Stage IVB (cT1a, cN2, cM1c) - Signed by Creig Hines, MD on 04/03/2022   04/09/2022 - 05/28/2022 Chemotherapy   Patient is on Treatment Plan : LUNG NSCLC Pemetrexed + Carboplatin q21d x 4 Cycles     06/25/2022 -  Chemotherapy   Patient is on Treatment Plan : NSCLC Carboplatin + Paclitaxel Weekly X 6 Weeks with XRT       ALLERGIES:  is allergic to latex and levofloxacin.  MEDICATIONS:  Current Outpatient Medications  Medication Sig Dispense Refill   acetaminophen (TYLENOL) 500 MG tablet Take 1,000 mg by mouth every 6 (six) hours as needed for moderate pain.     albuterol (PROVENTIL) (2.5 MG/3ML) 0.083% nebulizer solution Inhale into the lungs.     albuterol (VENTOLIN HFA) 108 (90 Base) MCG/ACT inhaler Inhale 2 puffs into the lungs every 6 (six) hours as needed for wheezing or shortness of breath. 8 g 2   ALPRAZolam (XANAX) 0.5 MG tablet Take 1 tablet (0.5 mg total) by mouth 2 (two) times daily as needed for anxiety. 60 tablet 2   fenofibrate micronized (LOFIBRA) 134 MG capsule Take 1 capsule (134 mg total) by mouth daily before breakfast. 30 capsule 11   fentaNYL (DURAGESIC) 12 MCG/HR Place 1 patch onto the skin every 3 (three) days. 10 patch 0   levothyroxine (SYNTHROID) 75 MCG tablet TAKE 1 TABLET EVERY DAY ON EMPTY STOMACHWITH A GLASS OF WATER AT LEAST 30-60 MINBEFORE BREAKFAST (Patient taking differently: Take 75 mcg by mouth daily before breakfast. On an empty stomach with a glass of water at least 30-60 minutes before breakfast.) 90 tablet 1   mirtazapine (REMERON) 15 MG tablet Take 1 tablet (15 mg total) by mouth at bedtime. 30 tablet 1   OLANZapine (ZYPREXA) 10 MG tablet Take 1 tablet (10 mg total) by mouth at bedtime. 30 tablet 2   omeprazole (PRILOSEC) 20 MG capsule TAKE 1 CAPSULE BY MOUTH DAILY (Patient taking differently: Take 20 mg by mouth daily.) 30 capsule 3   ondansetron (ZOFRAN) 4 MG tablet Take 1 tablet (4 mg total) by mouth every 6 (six)  hours as needed for nausea. 30 tablet 1   oxyCODONE (OXY IR/ROXICODONE) 5 MG immediate release tablet Take 1-2 tablets (5-10 mg total) by mouth every 4 (four) hours as needed (pain). 60 tablet 0   prednisoLONE acetate (PRED FORTE) 1 % ophthalmic suspension Place 1 drop into the right eye daily.     RESTASIS 0.05 % ophthalmic emulsion Place 1 drop into both eyes 2 (two) times daily.     rosuvastatin (CRESTOR) 10 MG tablet TAKE ONE TABLET EVERY DAY. (Patient taking differently: Take 10 mg by mouth daily.) 90 tablet 3   promethazine (PHENERGAN) 12.5 MG tablet Take 1 tablet (12.5 mg total) by mouth every 8 (eight) hours as needed for nausea or vomiting. (Patient not taking: Reported on 04/09/2022) 30 tablet 0   No current facility-administered medications for this visit.   Facility-Administered Medications Ordered in Other Visits  Medication Dose Route Frequency Provider Last Rate Last Admin   sodium chloride flush (NS) 0.9 % injection 10 mL  10 mL Intravenous Once Avalina Benko, Daryl Eastern, NP        VITAL SIGNS: BP (!) 146/78   Pulse Marland Kitchen)  123   Temp (!) 97 F (36.1 C) (Tympanic)   Resp (!) 22   Ht 5\' 1"  (1.549 m)   Wt 148 lb (67.1 kg)   SpO2 98%   BMI 27.96 kg/m  Filed Weights   06/27/22 0830  Weight: 148 lb (67.1 kg)     Estimated body mass index is 27.96 kg/m as calculated from the following:   Height as of this encounter: 5\' 1"  (1.549 m).   Weight as of this encounter: 148 lb (67.1 kg).  LABS: CBC:    Component Value Date/Time   WBC 16.4 (H) 06/27/2022 0850   HGB 9.5 (L) 06/27/2022 0850   HCT 30.3 (L) 06/27/2022 0850   PLT 222 06/27/2022 0850   MCV 93.2 06/27/2022 0850   NEUTROABS 13.6 (H) 06/27/2022 0850   LYMPHSABS 0.8 06/27/2022 0850   MONOABS 1.5 (H) 06/27/2022 0850   EOSABS 0.0 06/27/2022 0850   BASOSABS 0.1 06/27/2022 0850   Comprehensive Metabolic Panel:    Component Value Date/Time   NA 136 06/25/2022 0803   NA 142 10/02/2020 0938   K 3.2 (L) 06/25/2022 0803    CL 100 06/25/2022 0803   CO2 23 06/25/2022 0803   BUN 11 06/25/2022 0803   BUN 22 10/02/2020 0938   CREATININE 1.29 (H) 06/25/2022 0803   CREATININE 0.95 01/29/2016 1429   GLUCOSE 144 (H) 06/25/2022 0803   CALCIUM 8.3 (L) 06/25/2022 0803   AST 19 06/25/2022 0803   ALT 11 06/25/2022 0803   ALKPHOS 98 06/25/2022 0803   BILITOT 0.6 06/25/2022 0803   PROT 7.5 06/25/2022 0803   ALBUMIN 2.7 (L) 06/25/2022 0803    RADIOGRAPHIC STUDIES: DG Chest 2 View  Result Date: 06/25/2022 CLINICAL DATA:  Shortness of breath. History of metastatic lung cancer on chemotherapy. EXAM: CHEST - 2 VIEW COMPARISON:  Chest two views 04/25/2022, CT chest 06/17/2022 FINDINGS: Right chest wall porta catheter tip overlies the central superior vena cava. Cardiac silhouette and mediastinal contours are unchanged. Note is made of a right hilar mass better seen on recent 06/17/2021 CT. Moderate calcification within aortic arch. Moderate elevation of the right hemidiaphragm. Small right pleural effusion and inferolateral pleural thickening is increased from 04/25/2022 radiograph and consistent with findings on recent 06/17/2022 CT. Mild bilateral mid and lower lung interstitial thickening. No pneumothorax. Mild-to-moderate multilevel degenerative disc changes of the thoracic spine. IMPRESSION: 1. Mild bilateral mid and lower lung interstitial thickening, possible mild interstitial pulmonary edema. 2. Small right pleural effusion and inferolateral pleural thickening, increased from 04/25/2022 radiograph and consistent with findings on recent 06/17/2022 CT. 3. Note is made of a right hilar mass better seen on recent 06/17/2021 CT. Electronically Signed   By: 08/16/2022 M.D.   On: 06/25/2022 12:57   CT CHEST ABDOMEN PELVIS W CONTRAST  Result Date: 06/17/2022 CLINICAL DATA:  Metastatic lung cancer. Current chemotherapy patient. Shortness of breath. * Tracking Code: BO * EXAM: CT CHEST, ABDOMEN, AND PELVIS WITH CONTRAST TECHNIQUE:  Multidetector CT imaging of the chest, abdomen and pelvis was performed following the standard protocol during bolus administration of intravenous contrast. RADIATION DOSE REDUCTION: This exam was performed according to the departmental dose-optimization program which includes automated exposure control, adjustment of the mA and/or kV according to patient size and/or use of iterative reconstruction technique. CONTRAST:  06/27/2022 OMNIPAQUE IOHEXOL 300 MG/ML  SOLN COMPARISON:  03/20/2022 FINDINGS: CT CHEST FINDINGS Cardiovascular: Right Port-A-Cath tip: Lower SVC. Progressive right hilar and mediastinal mass causes worsened narrowing of the right  upper lobe and right lower lobe pulmonary arteries (image 22 and 26 of series 3, respectively) and increased encroachment and extrinsic compression of the lower SVC (image 24, series 3). Aortic arch atherosclerotic vascular disease.  Mild cardiomegaly. Mediastinum/Nodes: As noted above, there is progressive confluent right hilar and mediastinal adenopathy contributing to narrowing of the trachea at the carina (image 19, series 3) and narrowing of the bronchus intermedius (image 24, series 3). Tracheomalacia may also be contributing to the tracheal narrowing given the somewhat slit-like cross-sectional contour of the lower trachea. Overall central right tumor measures about 5.1 by 5.7 cm on image 24 series 3, previously this was made up of individual nodes measuring up to about 2.3 cm individually. A right lower paratracheal node measuring 1.9 cm in short axis on image 19 series 3 previously measured about 2.0 cm. A right internal mammary lymph node currently measures 1.9 cm in short axis on image 29 series 3 (formerly 1.1 cm) and invades the third intercostal space. Lungs/Pleura: Small right pleural effusion with possible tethering of the visceral and parietal pleura on the right on image 27 of series 3. Trace left pleural effusion. Marked airway narrowing and plugging in the  right upper lobe with secondary pulmonary lobular septal thickening and ground-glass opacities throughout much of the right upper lobe. Substantial atelectasis in the right middle lobe and right lower lobe with marked airway narrowing. On the left side there is a somewhat lesser degree of secondary pulmonary lobular septal thickening but with scattered ground-glass pulmonary nodularity and scattered additional potential metastatic nodularity. This includes a new 1.2 by 1.0 cm nodule anteriorly in the left upper lobe on image 51 series 4 and scattered smaller nodules along the left pleural surfaces, in the left upper lobe, and left lower lobe. Musculoskeletal: Progressive lytic destruction of the right fourth rib, with a new soft tissue mass component measuring 3.0 by 1.6 cm in the right chest wall on image 24 series 3. Probably pathologic fracture of the right eleventh rib posteriorly on image 48 of series 3 is new and associated with an abnormal rib lucency which is likewise new. Small lytic lesion of the left posterior sternal manubrium adjacent to the sternoclavicular joint. Lytic lesion in the right distal clavicle. Lucent lesions at T7, T9, and T11 in the thoracic spine compatible with new thoracic spine metastatic lesions. There is also some left posterior vertebral body and pedicle lytic involvement at the T7 vertebra. Right lower axillary lymph node or posterior breast nodule is enhancing and measures 1.3 cm in diameter on image 39 of series 3, new compared to the prior exam likely representing malignancy. A new small enhancing nodule in the left lower breast measures 0.7 cm in diameter on image 41 series 3. Subcutaneous nodule posterior to the left latissimus dorsi muscle measuring 1.2 by 0.8 cm on image 30 series 3, new compared to the prior exam and suspicious for subcutaneous metastatic lesion. CT ABDOMEN PELVIS FINDINGS Hepatobiliary: Unremarkable Pancreas: Unremarkable Spleen: There are 2-3 new small  hypodense lesions in the spleen which are technically nonspecific but given the clinical context raise some concern for potential early metastatic disease. A 0.7 cm lesion in the lower spleen on image 52 of series 3 represents an index lesion. Adrenals/Urinary Tract: Right adrenal mass 3.3 by 1.6 cm on image 47 series 3, formerly 3.7 by 2.0 cm. Appearance suspicious for malignancy but mildly improved from previous. Kidneys unremarkable. Stomach/Bowel: Sigmoid colon diverticulosis. Vascular/Lymphatic: Atherosclerosis is present, including aortoiliac atherosclerotic disease.  Reproductive: Partially obscured by streak artifact from the right hip implant. Suspected hysterectomy. Other: New 0.8 by 0.5 cm soft tissue nodule along the lower omentum, image 98 of series 3. New right lower quadrant omental nodule, 0.9 by 0.8 cm on image 88 series 3. Musculoskeletal: Lytic metastatic lesions particularly at L1, L4, L5, in the right iliac bone (image 130, series 5), and in the left iliac bone (image 95, series 5). These are new and/or substantially enlarged compared to previous. New soft tissue mass in the left gluteus maximus muscle, 2.4 by 1.6 cm on image 98 series 3, compatible with metastatic lesion. 0.8 cm nodule along the superficial fascia margin of the gluteal musculature on the right on image 93 series 3. Multifocal rim enhancing nodularity in the lumbar paraspinal musculature, including a 1.1 cm in diameter lesion in the left paraspinal musculature on image 61 series 3, compatible with additional intramuscular metastatic lesions. IMPRESSION: 1. Significant progression of malignancy, with enlargement of the right hilar and mediastinal mass, progressive confluent right hilar and mediastinal adenopathy, new bilateral pulmonary nodules, new bilateral pleural effusions, new subcutaneous and intramuscular metastatic lesions, and new and/or enlarging lytic osseous metastatic lesions. 2. There is also increased narrowing of  the trachea at the carina and narrowing of the bronchus intermedius due to extrinsic compression from the right hilar and mediastinal mass. There is also considerable airway narrowing and plugging in the right upper lobe. 3. New small hypodense lesions in the spleen are technically nonspecific but raise the possibility of early metastatic disease. 4. Reduced size of the right adrenal mass, currently 3.3 by 1.6 cm, previously 3.7 by 2.0 cm. 5. New small right and trace left pleural effusions. The right pleural effusion may be partially loculated and there is some tethering of the visceral and parietal pleura on the right. Trace left pleural effusion. 6. Progressive lytic involvement of the right fourth rib and right eleventh rib. 7. New small soft tissue nodules in the subcutaneous tissues posterior to the left latissimus dorsi muscle and in the right gluteal musculature compatible with subcutaneous metastatic lesions. New small enhancing inferior breast nodules, likely metastatic. 8. Aortic atherosclerosis. Aortic Atherosclerosis (ICD10-I70.0). Electronically Signed   By: Gaylyn Rong M.D.   On: 06/17/2022 19:45   DG Sniff Test  Result Date: 06/04/2022 CLINICAL DATA:  Shortness of breath, right lung cancer EXAM: CHEST FLUOROSCOPY TECHNIQUE: Real-time fluoroscopic evaluation of the chest was performed. FLUOROSCOPY: Radiation Exposure Index (as provided by the fluoroscopic device): 2.9 mGy COMPARISON:  CT chest 03/20/2022 FINDINGS: Real-time fluoroscopy of the diaphragms was performed during inspiration, expiration and sniffing. Elevation of the right diaphragm. Right basilar atelectasis. No significant diaphragmatic motion during inspiration, expiration or sniffing. Normal left diaphragmatic motion during inspiration, expiration and sniffing. IMPRESSION: 1. No significant right diaphragmatic motion during inspiration, expiration or sniffing consistent with right diaphragmatic paralysis. Electronically  Signed   By: Elige Ko M.D.   On: 06/04/2022 12:38    PERFORMANCE STATUS (ECOG) : 2 - Symptomatic, <50% confined to bed  Review of Systems Unless otherwise noted, a complete review of systems is negative.  Physical Exam General: NAD Cardiovascular: regular rate and rhythm Pulmonary: clear ant fields, diminished right lung Abdomen: soft, nontender, + bowel sounds GU: no suprapubic tenderness Extremities: no edema, no joint deformities Skin: no rashes Neurological: Weakness but otherwise nonfocal  IMPRESSION/PLAN: Stage IVa mucinous adenocarcinoma  -progression noted on recent CT.  Patient was not interested in hospice and desired further treatment.  Plan is for  weekly CarboTaxol.  Shortness of breath-chronic and secondary to rapidly progressive lung cancer.    Tachycardia -discussed with Dr. Smith Robert on likely secondary to significant tumor burden.  Hypokalemia -proceed with IV repletion and IV fluids today.  Restart oral supplement  Neoplasm related pain -continue fentanyl/oxycodone  Goals of care -patient still desires ongoing cancer treatment.  I had previously sent patient home with a MOST form.  Husband says that they are discussing it with family and have not yet arrived at a decision.  Strongly encouraged DNR/DNI.  RTC next week to see Dr. Smith Robert.  Case and plan discussed with Dr. Smith Robert  Patient expressed understanding and was in agreement with this plan. She also understands that She can call clinic at any time with any questions, concerns, or complaints.   Thank you for allowing me to participate in the care of this very pleasant patient.   Time Total: 15 minutes  Visit consisted of counseling and education dealing with the complex and emotionally intense issues of symptom management in the setting of serious illness.Greater than 50%  of this time was spent counseling and coordinating care related to the above assessment and plan.  Signed by: Laurette Schimke, PhD,  NP-C

## 2022-06-30 ENCOUNTER — Ambulatory Visit: Payer: PPO

## 2022-06-30 ENCOUNTER — Other Ambulatory Visit: Payer: Self-pay

## 2022-06-30 ENCOUNTER — Ambulatory Visit
Admission: RE | Admit: 2022-06-30 | Discharge: 2022-06-30 | Disposition: A | Discharge status: Home / Self Care | Payer: PPO | Source: Ambulatory Visit | Attending: Radiation Oncology | Admitting: Radiation Oncology

## 2022-06-30 DIAGNOSIS — C7951 Secondary malignant neoplasm of bone: Secondary | ICD-10-CM | POA: Diagnosis not present

## 2022-06-30 DIAGNOSIS — C3411 Malignant neoplasm of upper lobe, right bronchus or lung: Secondary | ICD-10-CM | POA: Diagnosis not present

## 2022-06-30 DIAGNOSIS — Z510 Encounter for antineoplastic radiation therapy: Secondary | ICD-10-CM | POA: Diagnosis not present

## 2022-06-30 DIAGNOSIS — Z87891 Personal history of nicotine dependence: Secondary | ICD-10-CM | POA: Diagnosis not present

## 2022-06-30 LAB — RAD ONC ARIA SESSION SUMMARY
Course Elapsed Days: 4
Plan Fractions Treated to Date: 3
Plan Prescribed Dose Per Fraction: 2 Gy
Plan Total Fractions Prescribed: 20
Plan Total Prescribed Dose: 40 Gy
Reference Point Dosage Given to Date: 6 Gy
Reference Point Session Dosage Given: 2 Gy
Session Number: 3

## 2022-07-01 ENCOUNTER — Ambulatory Visit
Admission: RE | Admit: 2022-07-01 | Discharge: 2022-07-01 | Disposition: A | Discharge status: Home / Self Care | Payer: PPO | Source: Ambulatory Visit | Attending: Radiation Oncology | Admitting: Radiation Oncology

## 2022-07-01 ENCOUNTER — Other Ambulatory Visit: Payer: Self-pay | Admitting: *Deleted

## 2022-07-01 ENCOUNTER — Emergency Department: Payer: PPO

## 2022-07-01 ENCOUNTER — Other Ambulatory Visit: Payer: Self-pay

## 2022-07-01 ENCOUNTER — Encounter: Payer: Self-pay | Admitting: Emergency Medicine

## 2022-07-01 ENCOUNTER — Inpatient Hospital Stay
Admission: EM | Admit: 2022-07-01 | Discharge: 2022-07-10 | DRG: 871 | Disposition: E | Payer: PPO | Attending: Internal Medicine | Admitting: Internal Medicine

## 2022-07-01 DIAGNOSIS — F419 Anxiety disorder, unspecified: Secondary | ICD-10-CM | POA: Diagnosis present

## 2022-07-01 DIAGNOSIS — N179 Acute kidney failure, unspecified: Secondary | ICD-10-CM | POA: Diagnosis present

## 2022-07-01 DIAGNOSIS — C799 Secondary malignant neoplasm of unspecified site: Secondary | ICD-10-CM | POA: Diagnosis not present

## 2022-07-01 DIAGNOSIS — C3411 Malignant neoplasm of upper lobe, right bronchus or lung: Secondary | ICD-10-CM | POA: Diagnosis present

## 2022-07-01 DIAGNOSIS — R6520 Severe sepsis without septic shock: Secondary | ICD-10-CM | POA: Diagnosis present

## 2022-07-01 DIAGNOSIS — I700 Atherosclerosis of aorta: Secondary | ICD-10-CM | POA: Diagnosis present

## 2022-07-01 DIAGNOSIS — F32A Depression, unspecified: Secondary | ICD-10-CM | POA: Diagnosis present

## 2022-07-01 DIAGNOSIS — E785 Hyperlipidemia, unspecified: Secondary | ICD-10-CM | POA: Diagnosis present

## 2022-07-01 DIAGNOSIS — R457 State of emotional shock and stress, unspecified: Secondary | ICD-10-CM | POA: Diagnosis not present

## 2022-07-01 DIAGNOSIS — Z86010 Personal history of colonic polyps: Secondary | ICD-10-CM

## 2022-07-01 DIAGNOSIS — C7951 Secondary malignant neoplasm of bone: Secondary | ICD-10-CM | POA: Diagnosis present

## 2022-07-01 DIAGNOSIS — C3490 Malignant neoplasm of unspecified part of unspecified bronchus or lung: Secondary | ICD-10-CM | POA: Diagnosis present

## 2022-07-01 DIAGNOSIS — Y95 Nosocomial condition: Secondary | ICD-10-CM | POA: Diagnosis present

## 2022-07-01 DIAGNOSIS — E039 Hypothyroidism, unspecified: Secondary | ICD-10-CM | POA: Diagnosis present

## 2022-07-01 DIAGNOSIS — Z90710 Acquired absence of both cervix and uterus: Secondary | ICD-10-CM

## 2022-07-01 DIAGNOSIS — J440 Chronic obstructive pulmonary disease with acute lower respiratory infection: Secondary | ICD-10-CM | POA: Diagnosis present

## 2022-07-01 DIAGNOSIS — M8448XD Pathological fracture, other site, subsequent encounter for fracture with routine healing: Secondary | ICD-10-CM | POA: Diagnosis present

## 2022-07-01 DIAGNOSIS — Z823 Family history of stroke: Secondary | ICD-10-CM

## 2022-07-01 DIAGNOSIS — I493 Ventricular premature depolarization: Secondary | ICD-10-CM | POA: Diagnosis present

## 2022-07-01 DIAGNOSIS — K8020 Calculus of gallbladder without cholecystitis without obstruction: Secondary | ICD-10-CM | POA: Diagnosis present

## 2022-07-01 DIAGNOSIS — J90 Pleural effusion, not elsewhere classified: Secondary | ICD-10-CM | POA: Diagnosis present

## 2022-07-01 DIAGNOSIS — J189 Pneumonia, unspecified organism: Principal | ICD-10-CM | POA: Diagnosis present

## 2022-07-01 DIAGNOSIS — R0689 Other abnormalities of breathing: Secondary | ICD-10-CM | POA: Diagnosis not present

## 2022-07-01 DIAGNOSIS — J9621 Acute and chronic respiratory failure with hypoxia: Secondary | ICD-10-CM | POA: Insufficient documentation

## 2022-07-01 DIAGNOSIS — R590 Localized enlarged lymph nodes: Secondary | ICD-10-CM | POA: Diagnosis present

## 2022-07-01 DIAGNOSIS — C7971 Secondary malignant neoplasm of right adrenal gland: Secondary | ICD-10-CM | POA: Diagnosis present

## 2022-07-01 DIAGNOSIS — R54 Age-related physical debility: Secondary | ICD-10-CM | POA: Diagnosis present

## 2022-07-01 DIAGNOSIS — N1832 Chronic kidney disease, stage 3b: Secondary | ICD-10-CM | POA: Insufficient documentation

## 2022-07-01 DIAGNOSIS — Z87891 Personal history of nicotine dependence: Secondary | ICD-10-CM

## 2022-07-01 DIAGNOSIS — R519 Headache, unspecified: Secondary | ICD-10-CM | POA: Diagnosis present

## 2022-07-01 DIAGNOSIS — Z515 Encounter for palliative care: Secondary | ICD-10-CM

## 2022-07-01 DIAGNOSIS — Z510 Encounter for antineoplastic radiation therapy: Secondary | ICD-10-CM | POA: Diagnosis not present

## 2022-07-01 DIAGNOSIS — Z1152 Encounter for screening for COVID-19: Secondary | ICD-10-CM

## 2022-07-01 DIAGNOSIS — C7931 Secondary malignant neoplasm of brain: Secondary | ICD-10-CM | POA: Diagnosis present

## 2022-07-01 DIAGNOSIS — Z96641 Presence of right artificial hip joint: Secondary | ICD-10-CM | POA: Diagnosis present

## 2022-07-01 DIAGNOSIS — A419 Sepsis, unspecified organism: Principal | ICD-10-CM | POA: Diagnosis present

## 2022-07-01 DIAGNOSIS — Z79899 Other long term (current) drug therapy: Secondary | ICD-10-CM

## 2022-07-01 DIAGNOSIS — I959 Hypotension, unspecified: Secondary | ICD-10-CM | POA: Diagnosis not present

## 2022-07-01 DIAGNOSIS — R000 Tachycardia, unspecified: Secondary | ICD-10-CM | POA: Diagnosis present

## 2022-07-01 DIAGNOSIS — Z923 Personal history of irradiation: Secondary | ICD-10-CM

## 2022-07-01 DIAGNOSIS — D631 Anemia in chronic kidney disease: Secondary | ICD-10-CM | POA: Diagnosis present

## 2022-07-01 DIAGNOSIS — Z881 Allergy status to other antibiotic agents status: Secondary | ICD-10-CM

## 2022-07-01 DIAGNOSIS — R0902 Hypoxemia: Secondary | ICD-10-CM | POA: Diagnosis not present

## 2022-07-01 DIAGNOSIS — Z90722 Acquired absence of ovaries, bilateral: Secondary | ICD-10-CM

## 2022-07-01 DIAGNOSIS — Z91040 Latex allergy status: Secondary | ICD-10-CM

## 2022-07-01 DIAGNOSIS — J811 Chronic pulmonary edema: Secondary | ICD-10-CM | POA: Diagnosis not present

## 2022-07-01 DIAGNOSIS — R5381 Other malaise: Secondary | ICD-10-CM | POA: Diagnosis present

## 2022-07-01 DIAGNOSIS — D630 Anemia in neoplastic disease: Secondary | ICD-10-CM | POA: Diagnosis present

## 2022-07-01 DIAGNOSIS — M8448XA Pathological fracture, other site, initial encounter for fracture: Secondary | ICD-10-CM | POA: Diagnosis present

## 2022-07-01 DIAGNOSIS — R0602 Shortness of breath: Secondary | ICD-10-CM | POA: Diagnosis not present

## 2022-07-01 DIAGNOSIS — Z9841 Cataract extraction status, right eye: Secondary | ICD-10-CM

## 2022-07-01 DIAGNOSIS — Z79890 Hormone replacement therapy (postmenopausal): Secondary | ICD-10-CM

## 2022-07-01 DIAGNOSIS — Z803 Family history of malignant neoplasm of breast: Secondary | ICD-10-CM

## 2022-07-01 DIAGNOSIS — Z66 Do not resuscitate: Secondary | ICD-10-CM | POA: Diagnosis not present

## 2022-07-01 DIAGNOSIS — E663 Overweight: Secondary | ICD-10-CM | POA: Diagnosis present

## 2022-07-01 DIAGNOSIS — Z9221 Personal history of antineoplastic chemotherapy: Secondary | ICD-10-CM

## 2022-07-01 DIAGNOSIS — Z947 Corneal transplant status: Secondary | ICD-10-CM

## 2022-07-01 DIAGNOSIS — Z8041 Family history of malignant neoplasm of ovary: Secondary | ICD-10-CM

## 2022-07-01 LAB — CBC WITH DIFFERENTIAL/PLATELET
Abs Immature Granulocytes: 0.28 10*3/uL — ABNORMAL HIGH (ref 0.00–0.07)
Basophils Absolute: 0.1 10*3/uL (ref 0.0–0.1)
Basophils Relative: 1 %
Eosinophils Absolute: 0.2 10*3/uL (ref 0.0–0.5)
Eosinophils Relative: 1 %
HCT: 29.8 % — ABNORMAL LOW (ref 36.0–46.0)
Hemoglobin: 9 g/dL — ABNORMAL LOW (ref 12.0–15.0)
Immature Granulocytes: 1 %
Lymphocytes Relative: 4 %
Lymphs Abs: 0.8 10*3/uL (ref 0.7–4.0)
MCH: 28.5 pg (ref 26.0–34.0)
MCHC: 30.2 g/dL (ref 30.0–36.0)
MCV: 94.3 fL (ref 80.0–100.0)
Monocytes Absolute: 1.8 10*3/uL — ABNORMAL HIGH (ref 0.1–1.0)
Monocytes Relative: 9 %
Neutro Abs: 18 10*3/uL — ABNORMAL HIGH (ref 1.7–7.7)
Neutrophils Relative %: 84 %
Platelets: 195 10*3/uL (ref 150–400)
RBC: 3.16 MIL/uL — ABNORMAL LOW (ref 3.87–5.11)
RDW: 16.4 % — ABNORMAL HIGH (ref 11.5–15.5)
WBC: 21.2 10*3/uL — ABNORMAL HIGH (ref 4.0–10.5)
nRBC: 0 % (ref 0.0–0.2)

## 2022-07-01 LAB — RAD ONC ARIA SESSION SUMMARY
Course Elapsed Days: 5
Plan Fractions Treated to Date: 4
Plan Prescribed Dose Per Fraction: 2 Gy
Plan Total Fractions Prescribed: 20
Plan Total Prescribed Dose: 40 Gy
Reference Point Dosage Given to Date: 8 Gy
Reference Point Session Dosage Given: 2 Gy
Session Number: 4

## 2022-07-01 MED FILL — Dexamethasone Sodium Phosphate Inj 100 MG/10ML: INTRAMUSCULAR | Qty: 1 | Status: AC

## 2022-07-01 NOTE — ED Triage Notes (Signed)
Pt arrived via [facility] from home with reports of shortness of breath, worse today ongoing since Sept, pt was 80% on RA uses O2 concentrator at home, 4L by EMS up to 95%, hx of R side lung CA.  P 67 107/36   Pt is on chemo and radiation.

## 2022-07-01 NOTE — ED Provider Notes (Signed)
[facility] Provider Note    Event Date/Time   First MD Initiated Contact with Patient 06/11/2022 2304     (approximate)   History   Shortness of Breath   HPI  Lorraine Mills is a 72 y.o. female with history of COPD, metastatic lung cancer currently getting chemotherapy who presents to the emergency department with increasing shortness of breath.  No chest pain.  No fever or productive cough.  Wears oxygen at home.  Symptoms worse with lying flat.   History provided by patient, husband.    Past Medical History:  Diagnosis Date   Anxiety    Arthritis    COPD (chronic obstructive pulmonary disease) (HCC)    Depression 01/31/2016   Goiter    Hyperlipidemia    TGs   Hypothyroidism    Lung cancer (HCC)    Seasonal allergies     Past Surgical History:  Procedure Laterality Date   ABDOMINAL HYSTERECTOMY     supracervical    BILATERAL OOPHORECTOMY  2001   BENIGN TUMOR   BREAST BIOPSY Left 1985   cyst    BRONCHIAL NEEDLE ASPIRATION BIOPSY  03/25/2022   Procedure: BRONCHIAL NEEDLE ASPIRATION BIOPSIES;  Surgeon: Karren Burly, MD;  Location: [facility];  Service: Endoscopy;;   CATARACT EXTRACTION Right    COLONOSCOPY  08/17/2009   COLONOSCOPY WITH PROPOFOL N/A 03/19/2016   Procedure: COLONOSCOPY WITH PROPOFOL;  Surgeon: Earline Mayotte, MD;  Location: [facility];  Service: Endoscopy;  Laterality: N/A;   COLONOSCOPY WITH PROPOFOL N/A 05/15/2021   Procedure: COLONOSCOPY WITH PROPOFOL;  Surgeon: Earline Mayotte, MD;  Location: [facility];  Service: Endoscopy;  Laterality: N/A;   CORNEAL TRANSPLANT Right    ENDOBRONCHIAL ULTRASOUND N/A 03/25/2022   Procedure: ENDOBRONCHIAL ULTRASOUND;  Surgeon: Karren Burly, MD;  Location: [facility];  Service: Endoscopy;  Laterality: N/A;   EYE SURGERY     IR IMAGING GUIDED PORT INSERTION  03/26/2022   JOINT REPLACEMENT     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   Procedure: TOTAL HIP  ARTHROPLASTY ANTERIOR APPROACH;  Surgeon: Kennedy Bucker, MD;  Location: [facility] OR[facility];  Service: Orthopedics;  Laterality: Right;   VIDEO BRONCHOSCOPY  03/25/2022   Procedure: VIDEO BRONCHOSCOPY WITHOUT FLUORO;  Surgeon: Karren Burly, MD;  Location: [facility];  Service: Endoscopy;;    MEDICATIONS:  Prior to Admission medications   Medication Sig Start Date End Date Taking? Authorizing Provider  acetaminophen (TYLENOL) 500 MG tablet Take 1,000 mg by mouth every 6 (six) hours as needed for moderate pain.    [provider]  albuterol (PROVENTIL) (2.5 MG/3ML) 0.083% nebulizer solution Inhale into the lungs. 06/05/22 06/05/23  [provider]  albuterol (VENTOLIN HFA) 108 (90 Base) MCG/ACT inhaler Inhale 2 puffs into the lungs every 6 (six) hours as needed for wheezing or shortness of breath. 05/07/22   Creig Hines, MD  ALPRAZolam Prudy Feeler) 0.5 MG tablet Take 1 tablet (0.5 mg total) by mouth 2 (two) times daily as needed for anxiety. 04/01/22   Sherlene Shams, MD  fenofibrate micronized (LOFIBRA) 134 MG capsule Take 1 capsule (134 mg total) by mouth daily before breakfast. 02/06/22 02/01/23  Sherlene Shams, MD  fentaNYL (DURAGESIC) 12 MCG/HR Place 1 patch onto the skin every 3 (three) days. 06/21/22   Michaelyn Barter, MD  levothyroxine (SYNTHROID) 75 MCG tablet TAKE 1 TABLET EVERY DAY ON EMPTY STOMACHWITH A GLASS OF WATER AT LEAST 30-60 MINBEFORE BREAKFAST Patient taking differently: Take  75 mcg by mouth daily before breakfast. On an empty stomach with a glass of water at least 30-60 minutes before breakfast. 12/22/21   Dutch Quint B, FNP  mirtazapine (REMERON) 15 MG tablet Take 1 tablet (15 mg total) by mouth at bedtime. 05/29/22   Sindy Guadeloupe, MD  OLANZapine (ZYPREXA) 10 MG tablet Take 1 tablet (10 mg total) by mouth at bedtime. 05/07/22   Sindy Guadeloupe, MD  omeprazole (PRILOSEC) 20 MG capsule TAKE 1 CAPSULE BY MOUTH DAILY Patient taking differently: Take 20 mg by mouth  daily. 01/28/21   [name], MD  ondansetron (ZOFRAN) 4 MG tablet Take 1 tablet (4 mg total) by mouth every 6 (six) hours as needed for nausea. 03/27/22   Danford, Suann Larry, MD  oxyCODONE (OXY IR/ROXICODONE) 5 MG immediate release tablet Take 1-2 tablets (5-10 mg total) by mouth every 4 (four) hours as needed (pain). 06/25/22   Borders, Kirt Boys, NP  potassium chloride (KLOR-CON M) 10 MEQ tablet Take 1 tablet (10 mEq total) by mouth daily. 06/27/22   Borders, Kirt Boys, NP  prednisoLONE acetate (PRED FORTE) 1 % ophthalmic suspension Place 1 drop into the right eye daily. 07/03/20   [provider]  promethazine (PHENERGAN) 12.5 MG tablet Take 1 tablet (12.5 mg total) by mouth every 8 (eight) hours as needed for nausea or vomiting. Patient not taking: Reported on 04/09/2022 04/01/22   [name], MD  RESTASIS 0.05 % ophthalmic emulsion Place 1 drop into both eyes 2 (two) times daily. 09/30/21   [provider]  rosuvastatin (CRESTOR) 10 MG tablet TAKE ONE TABLET EVERY DAY. Patient taking differently: Take 10 mg by mouth daily. 03/06/22   [name], MD    Physical Exam   Triage Vital Signs: ED Triage Vitals  Enc Vitals Group     BP 06/26/2022 2302 113/61     Pulse Rate 06/16/2022 2302 (!) 120     Resp 06/19/2022 2302 18     Temp 06/23/2022 2302 98.4 F (36.9 C)     Temp Source 06/19/2022 2302 Oral     SpO2 06/27/2022 2302 95 %     Weight 06/18/2022 2300 148 lb (67.1 kg)     Height 06/15/2022 2300 5\' 1"  (1.549 m)     Head Circumference --      Peak Flow --      Pain Score 06/14/2022 2300 0     Pain Loc --      Pain Edu? --      Excl. in [place]? --     Most recent vital signs: Vitals:   07/03/2022 2302 06/17/2022 2350  BP: 113/61   Pulse: (!) 120   Resp: 18   Temp: 98.4 F (36.9 C)   SpO2: 95% 98%    CONSTITUTIONAL: Alert, responds appropriately to questions.  Chronically ill-appearing HEAD: Normocephalic, atraumatic EYES: Conjunctivae clear, pupils appear equal,  sclera nonicteric ENT: normal nose; moist mucous membranes NECK: Supple, normal ROM CARD: Regular and tachycardic; S1 and S2 appreciated RESP: Patient is tachypneic, speaking in truncated sentences, no hypoxia on nasal cannula, no rhonchi or rales, no wheezing ABD/GI: Non-distended; soft, non-tender, no rebound, no guarding, no peritoneal signs BACK: The back appears normal EXT: Normal ROM in all joints; no deformity noted, no edema, no calf tenderness or calf swelling SKIN: Normal color for age and race; warm; no rash on exposed skin NEURO: Moves all extremities equally, normal speech PSYCH: The patient's mood and manner are  appropriate.   ED Results / Procedures / Treatments   LABS: (all labs ordered are listed, but only abnormal results are displayed) Labs Reviewed  COMPREHENSIVE METABOLIC PANEL - Abnormal; Notable for the following components:      Result Value   Chloride 97 (*)    Glucose, Bld 161 (*)    Creatinine, Ser 1.68 (*)    Calcium 8.3 (*)    Albumin 2.4 (*)    Alkaline Phosphatase 127 (*)    GFR, Estimated 32 (*)    All other components within normal limits  CBC WITH DIFFERENTIAL/PLATELET - Abnormal; Notable for the following components:   WBC 21.2 (*)    RBC 3.16 (*)    Hemoglobin 9.0 (*)    HCT 29.8 (*)    RDW 16.4 (*)    Neutro Abs 18.0 (*)    Monocytes Absolute 1.8 (*)    Abs Immature Granulocytes 0.28 (*)    All other components within normal limits  BRAIN NATRIURETIC PEPTIDE - Abnormal; Notable for the following components:   B Natriuretic Peptide 133.9 (*)    All other components within normal limits  BLOOD GAS, VENOUS - Abnormal; Notable for the following components:   pO2, Ven 75 (*)    Bicarbonate 29.7 (*)    Acid-Base Excess 2.6 (*)    All other components within normal limits  RESP PANEL BY RT-PCR (RSV, FLU A&B, COVID)  RVPGX2  CULTURE, BLOOD (ROUTINE X 2)  CULTURE, BLOOD (ROUTINE X 2)  LACTIC ACID, PLASMA  MAGNESIUM  PROCALCITONIN   URINALYSIS, ROUTINE W REFLEX MICROSCOPIC  TROPONIN I (HIGH SENSITIVITY)  TROPONIN I (HIGH SENSITIVITY)     EKG:    Date: 07/02/2022 1:11 AM  Rate: 95  Rhythm: normal sinus rhythm  QRS Axis: Right axis deviation  Intervals: Short QT interval.  QTc 261 ms  ST/T Wave abnormalities: normal  Conduction Disutrbances: none  Narrative Interpretation: PVCs, right axis deviation, short QT interval     RADIOLOGY: My personal review and interpretation of imaging: CT scan shows no PE but does show right middle and lower lobe consolidation that could be atelectasis versus infiltrate versus mass.  I have personally reviewed all radiology reports.   CT Angio Chest PE W and/or Wo Contrast  Result Date: 07/02/2022 CLINICAL DATA:  Concern for pulmonary embolism. Worsening shortness of breath. History of metastatic lung cancer. EXAM: CT ANGIOGRAPHY CHEST WITH CONTRAST TECHNIQUE: Multidetector CT imaging of the chest was performed using the standard protocol during bolus administration of intravenous contrast. Multiplanar CT image reconstructions and MIPs were obtained to evaluate the vascular anatomy. RADIATION DOSE REDUCTION: This exam was performed according to the departmental dose-optimization program which includes automated exposure control, adjustment of the mA and/or kV according to patient size and/or use of iterative reconstruction technique. CONTRAST:  31mL OMNIPAQUE IOHEXOL 350 MG/ML SOLN COMPARISON:  Chest CT dated 06/17/2022. FINDINGS: Cardiovascular: There is no cardiomegaly. No significant pericardial effusion. Mild atherosclerotic calcification of the thoracic aorta. No aneurysmal dilatation or dissection. The origins of the great vessels of the aortic arch appear patent. Evaluation of the pulmonary arteries is limited due to suboptimal opacification and timing of the contrast. Apparent focal nonopacification of a branch of the pulmonary artery in the left lower lobe (70/5) likely  artifactual and related to streak artifact. No large or central pulmonary artery embolus identified. Right-sided Port-A-Cath with tip close to the cavoatrial junction. Mediastinum/Nodes: Right hilar mass extending into the mediastinum and measuring approximately 6.6 x 6.1 cm  and progressed since the prior CT. Mediastinal adenopathy in the prevascular space measure up to 1.3 cm also progressed since the prior CT. No mediastinal fluid collection. Lungs/Pleura: There is high-grade narrowing of the right middle lobe bronchus due to compression by the right hilar mass. There is large area of consolidation involving the right middle and right lower lobes which may represent postobstructive atelectasis or infiltrate versus mass. Scattered bilateral upper lobe pulmonary nodularity consistent with metastatic disease. There is diffuse interstitial and interlobular septal thickening in the right upper lobe which may represent edema. Lymphangitic spread of tumor is not excluded. Small bilateral pleural effusions. No pneumothorax. The central airways remain patent. Upper Abdomen: Indeterminate 13 mm hypoenhancing lesion in the right lobe of the liver (95/5). There is layering sludge and small stones. Musculoskeletal: Osteopenia with degenerative changes of the spine. Multiple in osseous lucent lesions consistent with metastatic disease. Review of the MIP images confirms the above findings. IMPRESSION: 1. No large or central pulmonary artery embolus identified. 2. Interval progression of the right hilar mass and mediastinal adenopathy. 3. Large area of consolidation involving the right middle and right lower lobes may represent postobstructive atelectasis or infiltrate versus mass. 4. Scattered bilateral upper lobe pulmonary nodularity consistent with metastatic disease. 5. Small bilateral pleural effusions. 6. Osseous metastatic disease. 7. Indeterminate 13 mm hypoenhancing lesion in the right lobe of the liver. 8.  Cholelithiasis. 9.  Aortic Atherosclerosis (ICD10-I70.0). Electronically Signed   By: Elgie Collard M.D.   On: 07/02/2022 01:19   DG Chest Portable 1 View  Result Date: 06/14/2022 CLINICAL DATA:  Shortness of breath EXAM: PORTABLE CHEST 1 VIEW COMPARISON:  Chest x-ray 06/25/2022 FINDINGS: Right chest port catheter tip projects over the SVC, unchanged. There are patchy airspace opacities in the right mid and lower lung which have mildly increased. Small right pleural effusion persists. There central pulmonary vascular congestion and central interstitial prominence which has increased. There is no pneumothorax. The cardiomediastinal silhouette is stable. No acute fractures are seen. IMPRESSION: 1. Increasing airspace opacities in the right mid and lower lung. 2. Stable small right pleural effusion. 3. Increasing central pulmonary vascular congestion and central interstitial prominence, likely edema. Electronically Signed   By: Darliss Cheney M.D.   On: 06/20/2022 23:31     PROCEDURES:  Critical Care performed: Yes, see critical care procedure note(s)   CRITICAL CARE Performed by: Baxter Hire Inanna Telford   Total critical care time: 40 minutes  Critical care time was exclusive of separately billable procedures and treating other patients.  Critical care was necessary to treat or prevent imminent or life-threatening deterioration.  Critical care was time spent personally by me on the following activities: development of treatment plan with patient and/or surrogate as well as nursing, discussions with consultants, evaluation of patient's response to treatment, examination of patient, obtaining history from patient or surrogate, ordering and performing treatments and interventions, ordering and review of laboratory studies, ordering and review of radiographic studies, pulse oximetry and re-evaluation of patient's condition.   Marland Kitchen1-3 Lead EKG Interpretation  Performed by: Kimra Kantor, Layla Maw, DO Authorized by:  Tyleek Smick, Layla Maw, DO     Interpretation: abnormal     ECG rate:  120   ECG rate assessment: tachycardic     Rhythm: sinus tachycardia     Ectopy: PVCs     Conduction: normal       IMPRESSION / MDM / ASSESSMENT AND PLAN / ED COURSE  I reviewed the triage vital signs and the nursing notes.  Patient here with shortness of breath.  The patient is on the cardiac monitor to evaluate for evidence of arrhythmia and/or significant heart rate changes.   DIFFERENTIAL DIAGNOSIS (includes but not limited to):   Worsening malignancy, PE, pneumonia, CHF, pneumothorax, pleural effusion   Patient's presentation is most consistent with acute presentation with potential threat to life or bodily function.   PLAN: Will obtain CBC, BMP, troponin, BNP, COVID and flu swabs, CTA chest.  Will give albuterol, Atrovent for symptomatic relief.  Due to increased work of breathing, will place on high flow nasal cannula.  Respiratory unable to obtain ABG despite multiple attempts.  Will obtain VBG.  Anticipate admission.   MEDICATIONS GIVEN IN ED: Medications  ceFEPIme (MAXIPIME) 2 g in sodium chloride 0.9 % 100 mL IVPB (has no administration in time range)  albuterol (PROVENTIL) (2.5 MG/3ML) 0.083% nebulizer solution 5 mg (5 mg Nebulization Given 07/02/22 0115)  ipratropium (ATROVENT) nebulizer solution 0.5 mg (0.5 mg Nebulization Given 07/02/22 0115)  iohexol (OMNIPAQUE) 350 MG/ML injection 60 mL (60 mLs Intravenous Contrast Given 07/02/22 0049)     ED COURSE: Patient CT scan reviewed and interpreted by myself and the radiologist and shows no PE but does show right middle and lower lobe consolidation concerning for atelectasis, mass, infiltrate.  Patient does have a leukocytosis of 21,000 today but no fever.  She has been tachycardic and tachypneic but this seems to have improved on high flow nasal cannula and she states she is feeling like her breathing has improved.  I am concerned given her tachycardia,  leukocytosis that this could be infectious in nature especially given she is immunocompromised on chemotherapy.  Will give cefepime.  Cultures are pending.  Added on a procalcitonin as well.  Lactic is normal.  COVID, flu and RSV negative.  Will discuss with hospitalist for admission.   CONSULTS:  Consulted and discussed patient's case with hospitalist, Dr. Para March.  I have recommended admission and consulting physician agrees and will place admission orders.  Patient (and family if present) agree with this plan.   I reviewed all nursing notes, vitals, pertinent previous records.  All labs, EKGs, imaging ordered have been independently reviewed and interpreted by myself.    OUTSIDE RECORDS REVIEWED: Reviewed last pulmonology note on 06/05/2022.       FINAL CLINICAL IMPRESSION(S) / ED DIAGNOSES   Final diagnoses:  SOB (shortness of breath)  HCAP (healthcare-associated pneumonia)     Rx / DC Orders   ED Discharge Orders     None        Note:  This document was prepared using Dragon voice recognition software and may include unintentional dictation errors.   Zharia Conrow, Layla Maw, DO 07/02/22 0210

## 2022-07-01 NOTE — Progress Notes (Signed)
Pharmacist Chemotherapy Monitoring - Initial Assessment    Anticipated start date: 07/09/22   The following has been reviewed per standard work regarding the patient's treatment regimen: The patient's diagnosis, treatment plan and drug doses, and organ/hematologic function Lab orders and baseline tests specific to treatment regimen  The treatment plan start date, drug sequencing, and pre-medications Prior authorization status  Patient's documented medication list, including drug-drug interaction screen and prescriptions for anti-emetics and supportive care specific to the treatment regimen The drug concentrations, fluid compatibility, administration routes, and timing of the medications to be used The patient's access for treatment and lifetime cumulative dose history, if applicable  The patient's medication allergies and previous infusion related reactions, if applicable   Changes made to treatment plan:  treatment plan date  Follow up needed:  N/A   KHADEJAH SON, [facility], 06/17/2022  3:49 PM

## 2022-07-02 ENCOUNTER — Inpatient Hospital Stay: Payer: PPO | Admitting: Oncology

## 2022-07-02 ENCOUNTER — Other Ambulatory Visit: Payer: PPO

## 2022-07-02 ENCOUNTER — Ambulatory Visit: Payer: PPO

## 2022-07-02 ENCOUNTER — Emergency Department: Payer: PPO

## 2022-07-02 ENCOUNTER — Ambulatory Visit: Payer: PPO | Admitting: Oncology

## 2022-07-02 ENCOUNTER — Inpatient Hospital Stay: Payer: PPO

## 2022-07-02 DIAGNOSIS — E785 Hyperlipidemia, unspecified: Secondary | ICD-10-CM | POA: Diagnosis present

## 2022-07-02 DIAGNOSIS — F32A Depression, unspecified: Secondary | ICD-10-CM | POA: Diagnosis present

## 2022-07-02 DIAGNOSIS — C799 Secondary malignant neoplasm of unspecified site: Secondary | ICD-10-CM | POA: Diagnosis not present

## 2022-07-02 DIAGNOSIS — I700 Atherosclerosis of aorta: Secondary | ICD-10-CM | POA: Diagnosis present

## 2022-07-02 DIAGNOSIS — C3490 Malignant neoplasm of unspecified part of unspecified bronchus or lung: Secondary | ICD-10-CM

## 2022-07-02 DIAGNOSIS — E039 Hypothyroidism, unspecified: Secondary | ICD-10-CM | POA: Diagnosis present

## 2022-07-02 DIAGNOSIS — E663 Overweight: Secondary | ICD-10-CM | POA: Diagnosis present

## 2022-07-02 DIAGNOSIS — R6520 Severe sepsis without septic shock: Secondary | ICD-10-CM | POA: Diagnosis present

## 2022-07-02 DIAGNOSIS — J90 Pleural effusion, not elsewhere classified: Secondary | ICD-10-CM | POA: Diagnosis present

## 2022-07-02 DIAGNOSIS — N179 Acute kidney failure, unspecified: Secondary | ICD-10-CM | POA: Insufficient documentation

## 2022-07-02 DIAGNOSIS — Z1152 Encounter for screening for COVID-19: Secondary | ICD-10-CM | POA: Diagnosis not present

## 2022-07-02 DIAGNOSIS — C3411 Malignant neoplasm of upper lobe, right bronchus or lung: Secondary | ICD-10-CM | POA: Diagnosis present

## 2022-07-02 DIAGNOSIS — J9621 Acute and chronic respiratory failure with hypoxia: Secondary | ICD-10-CM | POA: Insufficient documentation

## 2022-07-02 DIAGNOSIS — J189 Pneumonia, unspecified organism: Secondary | ICD-10-CM | POA: Diagnosis present

## 2022-07-02 DIAGNOSIS — Z515 Encounter for palliative care: Secondary | ICD-10-CM | POA: Diagnosis not present

## 2022-07-02 DIAGNOSIS — J440 Chronic obstructive pulmonary disease with acute lower respiratory infection: Secondary | ICD-10-CM | POA: Diagnosis present

## 2022-07-02 DIAGNOSIS — N1832 Chronic kidney disease, stage 3b: Secondary | ICD-10-CM | POA: Diagnosis present

## 2022-07-02 DIAGNOSIS — Z66 Do not resuscitate: Secondary | ICD-10-CM | POA: Diagnosis not present

## 2022-07-02 DIAGNOSIS — D630 Anemia in neoplastic disease: Secondary | ICD-10-CM | POA: Diagnosis present

## 2022-07-02 DIAGNOSIS — C7971 Secondary malignant neoplasm of right adrenal gland: Secondary | ICD-10-CM | POA: Diagnosis present

## 2022-07-02 DIAGNOSIS — C7951 Secondary malignant neoplasm of bone: Secondary | ICD-10-CM | POA: Diagnosis present

## 2022-07-02 DIAGNOSIS — Y95 Nosocomial condition: Secondary | ICD-10-CM | POA: Diagnosis present

## 2022-07-02 DIAGNOSIS — A419 Sepsis, unspecified organism: Secondary | ICD-10-CM | POA: Diagnosis present

## 2022-07-02 DIAGNOSIS — D631 Anemia in chronic kidney disease: Secondary | ICD-10-CM | POA: Diagnosis present

## 2022-07-02 DIAGNOSIS — Z923 Personal history of irradiation: Secondary | ICD-10-CM | POA: Diagnosis not present

## 2022-07-02 DIAGNOSIS — C7931 Secondary malignant neoplasm of brain: Secondary | ICD-10-CM | POA: Diagnosis present

## 2022-07-02 DIAGNOSIS — Z87891 Personal history of nicotine dependence: Secondary | ICD-10-CM | POA: Diagnosis not present

## 2022-07-02 LAB — COMPREHENSIVE METABOLIC PANEL
ALT: 11 U/L (ref 0–44)
AST: 21 U/L (ref 15–41)
Albumin: 2.4 g/dL — ABNORMAL LOW (ref 3.5–5.0)
Alkaline Phosphatase: 127 U/L — ABNORMAL HIGH (ref 38–126)
Anion gap: 12 (ref 5–15)
BUN: 19 mg/dL (ref 8–23)
CO2: 27 mmol/L (ref 22–32)
Calcium: 8.3 mg/dL — ABNORMAL LOW (ref 8.9–10.3)
Chloride: 97 mmol/L — ABNORMAL LOW (ref 98–111)
Creatinine, Ser: 1.68 mg/dL — ABNORMAL HIGH (ref 0.44–1.00)
GFR, Estimated: 32 mL/min — ABNORMAL LOW (ref >60)
Glucose, Bld: 161 mg/dL — ABNORMAL HIGH (ref 70–99)
Potassium: 3.7 mmol/L (ref 3.5–5.1)
Sodium: 136 mmol/L (ref 135–145)
Total Bilirubin: 0.5 mg/dL (ref 0.3–1.2)
Total Protein: 7.3 g/dL (ref 6.5–8.1)

## 2022-07-02 LAB — MAGNESIUM: Magnesium: 1.9 mg/dL (ref 1.7–2.4)

## 2022-07-02 LAB — TROPONIN I (HIGH SENSITIVITY)
Troponin I (High Sensitivity): 15 ng/L (ref <18)
Troponin I (High Sensitivity): 14 ng/L (ref <18)

## 2022-07-02 LAB — IRON AND TIBC
Iron: 22 ug/dL — ABNORMAL LOW (ref 28–170)
Saturation Ratios: 13 % (ref 10.4–31.8)
TIBC: 175 ug/dL — ABNORMAL LOW (ref 250–450)
UIBC: 153 ug/dL

## 2022-07-02 LAB — URINALYSIS, ROUTINE W REFLEX MICROSCOPIC
Bacteria, UA: NONE SEEN
Bilirubin Urine: NEGATIVE
Glucose, UA: NEGATIVE mg/dL
Ketones, ur: NEGATIVE mg/dL
Nitrite: NEGATIVE
Protein, ur: 30 mg/dL — AB
Specific Gravity, Urine: 1.021 (ref 1.005–1.030)
pH: 6 (ref 5.0–8.0)

## 2022-07-02 LAB — LACTIC ACID, PLASMA: Lactic Acid, Venous: 1.8 mmol/L (ref 0.5–1.9)

## 2022-07-02 LAB — BLOOD GAS, VENOUS
Acid-Base Excess: 2.6 mmol/L — ABNORMAL HIGH (ref 0.0–2.0)
Bicarbonate: 29.7 mmol/L — ABNORMAL HIGH (ref 20.0–28.0)
O2 Saturation: 97.1 %
Patient temperature: 37
pCO2, Ven: 55 mmHg (ref 44–60)
pH, Ven: 7.34 (ref 7.25–7.43)
pO2, Ven: 75 mmHg — ABNORMAL HIGH (ref 32–45)

## 2022-07-02 LAB — CBC
HCT: 28.2 % — ABNORMAL LOW (ref 36.0–46.0)
Hemoglobin: 8.7 g/dL — ABNORMAL LOW (ref 12.0–15.0)
MCH: 29 pg (ref 26.0–34.0)
MCHC: 30.9 g/dL (ref 30.0–36.0)
MCV: 94 fL (ref 80.0–100.0)
Platelets: 205 10*3/uL (ref 150–400)
RBC: 3 MIL/uL — ABNORMAL LOW (ref 3.87–5.11)
RDW: 16.5 % — ABNORMAL HIGH (ref 11.5–15.5)
WBC: 21.1 10*3/uL — ABNORMAL HIGH (ref 4.0–10.5)
nRBC: 0 % (ref 0.0–0.2)

## 2022-07-02 LAB — CREATININE, SERUM
Creatinine, Ser: 1.76 mg/dL — ABNORMAL HIGH (ref 0.44–1.00)
GFR, Estimated: 31 mL/min — ABNORMAL LOW (ref >60)

## 2022-07-02 LAB — FERRITIN: Ferritin: 5009 ng/mL — ABNORMAL HIGH (ref 11–307)

## 2022-07-02 LAB — RESP PANEL BY RT-PCR (RSV, FLU A&B, COVID)  RVPGX2
Influenza A by PCR: NEGATIVE
Influenza B by PCR: NEGATIVE
Resp Syncytial Virus by PCR: NEGATIVE
SARS Coronavirus 2 by RT PCR: NEGATIVE

## 2022-07-02 LAB — BRAIN NATRIURETIC PEPTIDE: B Natriuretic Peptide: 133.9 pg/mL — ABNORMAL HIGH (ref 0.0–100.0)

## 2022-07-02 LAB — PROCALCITONIN: Procalcitonin: 0.88 ng/mL

## 2022-07-02 LAB — STREP PNEUMONIAE URINARY ANTIGEN: Strep Pneumo Urinary Antigen: NEGATIVE

## 2022-07-02 MED ORDER — ACETAMINOPHEN 650 MG RE SUPP: 650.0000 mg | Freq: Four times a day (QID) | RECTAL | Status: DC | PRN

## 2022-07-02 MED ORDER — SODIUM CHLORIDE 0.9 % IV SOLN
2.0000 g | Freq: Once | INTRAVENOUS | Status: AC
  Administered 2022-07-02: 2 g via INTRAVENOUS
  Filled 2022-07-02: qty 12.5

## 2022-07-02 MED ORDER — ONDANSETRON HCL 4 MG/2ML IJ SOLN
4.0000 mg | Freq: Four times a day (QID) | INTRAMUSCULAR | Status: DC | PRN
  Administered 2022-07-02: 4 mg via INTRAVENOUS
  Filled 2022-07-02: qty 2

## 2022-07-02 MED ORDER — SODIUM CHLORIDE 0.9 % IV SOLN: INTRAVENOUS | Status: DC

## 2022-07-02 MED ORDER — PIPERACILLIN-TAZOBACTAM 3.375 G IVPB 30 MIN
3.3750 g | Freq: Once | INTRAVENOUS | Status: AC
  Administered 2022-07-02: 3.375 g via INTRAVENOUS
  Filled 2022-07-02: qty 50

## 2022-07-02 MED ORDER — SODIUM CHLORIDE 0.9 % IV SOLN: 2.0000 g | INTRAVENOUS | Status: DC

## 2022-07-02 MED ORDER — ACETAMINOPHEN 325 MG PO TABS: 650.0000 mg | ORAL_TABLET | Freq: Four times a day (QID) | ORAL | Status: DC | PRN

## 2022-07-02 MED ORDER — ENOXAPARIN SODIUM 30 MG/0.3ML IJ SOSY
30.0000 mg | PREFILLED_SYRINGE | INTRAMUSCULAR | Status: DC
  Administered 2022-07-02 – 2022-07-03 (×2): 30 mg via SUBCUTANEOUS
  Filled 2022-07-02 (×2): qty 0.3

## 2022-07-02 MED ORDER — PIPERACILLIN-TAZOBACTAM 3.375 G IVPB
3.3750 g | Freq: Three times a day (TID) | INTRAVENOUS | Status: DC
  Administered 2022-07-02 – 2022-07-03 (×2): 3.375 g via INTRAVENOUS
  Filled 2022-07-02 (×2): qty 50

## 2022-07-02 MED ORDER — FENOFIBRATE 160 MG PO TABS
160.0000 mg | ORAL_TABLET | Freq: Every day | ORAL | Status: DC
  Administered 2022-07-02: 160 mg via ORAL
  Filled 2022-07-02: qty 1

## 2022-07-02 MED ORDER — IOHEXOL 350 MG/ML SOLN
60.0000 mL | Freq: Once | INTRAVENOUS | Status: AC | PRN
  Administered 2022-07-02: 60 mL via INTRAVENOUS

## 2022-07-02 MED ORDER — ALBUTEROL SULFATE (2.5 MG/3ML) 0.083% IN NEBU: 2.5000 mg | INHALATION_SOLUTION | RESPIRATORY_TRACT | Status: DC | PRN

## 2022-07-02 MED ORDER — SODIUM CHLORIDE 0.9 % IV SOLN
500.0000 mg | Freq: Every day | INTRAVENOUS | Status: DC
  Administered 2022-07-02: 500 mg via INTRAVENOUS
  Filled 2022-07-02: qty 5

## 2022-07-02 MED ORDER — OLANZAPINE 10 MG PO TABS: 10.0000 mg | ORAL_TABLET | Freq: Every day | ORAL | Status: DC

## 2022-07-02 MED ORDER — ALPRAZOLAM 0.5 MG PO TABS
0.5000 mg | ORAL_TABLET | Freq: Two times a day (BID) | ORAL | Status: DC | PRN
  Administered 2022-07-03: 0.5 mg via ORAL
  Filled 2022-07-02: qty 1

## 2022-07-02 MED ORDER — ONDANSETRON HCL 4 MG PO TABS: 4.0000 mg | ORAL_TABLET | Freq: Four times a day (QID) | ORAL | Status: DC | PRN

## 2022-07-02 MED ORDER — MIRTAZAPINE 15 MG PO TABS: 15.0000 mg | ORAL_TABLET | Freq: Every day | ORAL | Status: DC

## 2022-07-02 MED ORDER — LEVOTHYROXINE SODIUM 50 MCG PO TABS
75.0000 ug | ORAL_TABLET | Freq: Every day | ORAL | Status: DC
  Administered 2022-07-02 – 2022-07-03 (×2): 75 ug via ORAL
  Filled 2022-07-02: qty 1
  Filled 2022-07-02: qty 2

## 2022-07-02 MED ORDER — PANTOPRAZOLE SODIUM 40 MG PO TBEC
40.0000 mg | DELAYED_RELEASE_TABLET | Freq: Every day | ORAL | Status: DC
  Administered 2022-07-02 – 2022-07-03 (×2): 40 mg via ORAL
  Filled 2022-07-02 (×2): qty 1

## 2022-07-02 MED ORDER — OXYCODONE HCL 5 MG PO TABS
5.0000 mg | ORAL_TABLET | ORAL | Status: DC | PRN
  Administered 2022-07-02 – 2022-07-03 (×2): 10 mg via ORAL
  Filled 2022-07-02 (×2): qty 2

## 2022-07-02 MED ORDER — FENTANYL 12 MCG/HR TD PT72
1.0000 | MEDICATED_PATCH | TRANSDERMAL | Status: DC
  Administered 2022-07-02: 1 via TRANSDERMAL
  Filled 2022-07-02: qty 1

## 2022-07-02 MED ORDER — ALBUTEROL SULFATE (2.5 MG/3ML) 0.083% IN NEBU
5.0000 mg | INHALATION_SOLUTION | Freq: Once | RESPIRATORY_TRACT | Status: AC
  Administered 2022-07-02: 5 mg via RESPIRATORY_TRACT
  Filled 2022-07-02: qty 6

## 2022-07-02 MED ORDER — IPRATROPIUM-ALBUTEROL 0.5-2.5 (3) MG/3ML IN SOLN
3.0000 mL | Freq: Four times a day (QID) | RESPIRATORY_TRACT | Status: DC
  Administered 2022-07-02 – 2022-07-03 (×4): 3 mL via RESPIRATORY_TRACT
  Filled 2022-07-02 (×4): qty 3

## 2022-07-02 MED ORDER — GUAIFENESIN ER 600 MG PO TB12
600.0000 mg | ORAL_TABLET | Freq: Two times a day (BID) | ORAL | Status: DC
  Administered 2022-07-02 – 2022-07-03 (×2): 600 mg via ORAL
  Filled 2022-07-02 (×2): qty 1

## 2022-07-02 MED ORDER — ROSUVASTATIN CALCIUM 20 MG PO TABS
10.0000 mg | ORAL_TABLET | Freq: Every day | ORAL | Status: DC
  Administered 2022-07-02 – 2022-07-03 (×2): 10 mg via ORAL
  Filled 2022-07-02 (×2): qty 1

## 2022-07-02 MED ORDER — IPRATROPIUM BROMIDE 0.02 % IN SOLN
0.5000 mg | Freq: Once | RESPIRATORY_TRACT | Status: AC
  Administered 2022-07-02: 0.5 mg via RESPIRATORY_TRACT
  Filled 2022-07-02: qty 2.5

## 2022-07-02 NOTE — Consult Note (Signed)
Pharmacy Antibiotic Note  Lorraine Mills is a 72 y.o. female admitted on 07/05/2022 with postobstructive pneumonia.  Pharmacy has been consulted for Zosyn dosing.  Plan: Zosyn 3.375 grams IV (over 30 min) x 1, then start Zosyn 3.375g IV q8h (4 hour infusion). Follow renal function for adjustments  Height: 5\' 1"  (154.9 cm) Weight: 67.1 kg (148 lb) IBW/kg (Calculated) : 47.8  Temp (24hrs), Avg:98 F (36.7 C), Min:97.8 F (36.6 C), Max:98.4 F (36.9 C)  Recent Labs  Lab 06/27/22 0850 06/15/2022 2322 07/02/22 0505  WBC 16.4* 21.2* 21.1*  CREATININE 1.48* 1.68* 1.76*  LATICACIDVEN  --  1.8  --     Estimated Creatinine Clearance: 25.7 mL/min (A) (by C-G formula based on SCr of 1.76 mg/dL (H)).    Allergies  Allergen Reactions   Latex Rash   Levofloxacin     FLUOROQUINOLONES - unknown reaction    Antimicrobials this admission: Cefepime 1/24 x 1 Azithromycin 1/24 x 1 Zosyn 1/24 >>   Dose adjustments this admission: N/A  Microbiology results: 1/23 BCx: ngtd  Thank you for allowing pharmacy to be a part of this patient's care.  Lorin Picket 07/02/2022 2:38 PM

## 2022-07-02 NOTE — ED Notes (Signed)
While giving nighttime meds, pt stated she was uncomfortable in bed, and that she also had a sacral wound. Pt was medicated, then brief and linens changed. Sacral wound assessed, and mepilex pad placed on buttocks. Purewick remains in place. Pt more comfortable after repositioning.

## 2022-07-02 NOTE — Progress Notes (Signed)
  Progress Note   Patient: Lorraine Mills TDD:[id number] DOB: November 09, 1950 DOA: 07/03/2022     0 DOS: the patient was seen and examined on 07/02/2022   Brief hospital course: Lorraine Mills is a 72 y.o. 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  Patient came to the hospital with worsening shortness of breath, she developed severe hypoxemia, was placed on 10 L oxygen. CTscan showed right hilar mass, with large consolidation in the right lower lobe and right middle lobe, this is consistent with postobstructive pneumonia.  Patient was started on cefepime.  Assessment and Plan: * Postobstructive pneumonia Acute on chronic respiratory failure with hypoxia Patient condition is consistent with a possible Pneumonia.  Patient has a large mass in the right hilar result in severe consolidation in the right lower and middle lobes. Antibiotics will be changed to Zosyn, consult pulmonology. Patient was requiring 10 L oxygen, currently on 8 L oxygen.  Patient does not have volume overload with a BNP of 133   Lung cancer metastatic to bone [facility]) Metastatic to bone including skull, iliac bones, thoracic vertebra and ribs S/p recent pathologic rib fracture as well as to right adrenal On palliative radiation and chemotherapy Follows with oncology  Acute kidney injury. Patient renal function was normal earlier January.  Will obtain renal ultrasound to rule out hydronephrosis from metastatic cancer. Will give gentle rehydration as patient does not have any volume overload.  Anemia of chronic disease. Patient has adequate iron stores, B12 level pending.     Subjective:  Patient still has feeling short of breath with exertion, cough, nonproductive. No chest  pain.  Physical Exam: Vitals:   07/02/22 0930 07/02/22 0942 07/02/22 1000 07/02/22 1242  BP: (!) 109/50  (!) 107/57 (!) 96/50  Pulse: (!) 56  60 67  Resp: (!) 26  (!) 26 17  Temp:  97.9 F (36.6 C)  98 F (36.7 C)  TempSrc:  Oral    SpO2:   100% 98%  Weight:      Height:       General exam: Appears calm and comfortable  Respiratory system: Clear to auscultation. Respiratory effort normal. Cardiovascular system: S1 & S2 heard, RRR. No JVD, murmurs, rubs, gallops or clicks. No pedal edema. Gastrointestinal system: Abdomen is nondistended, soft and nontender. No organomegaly or masses felt. Normal bowel sounds heard. Central nervous system: Alert and oriented x3. No focal neurological deficits. Extremities: Symmetric 5 x 5 power. Skin: No rashes, lesions or ulcers Psychiatry: Mood & affect appropriate.   Data Reviewed:  Reviewed the CT scan results, reviewed all lab results.  Family Communication: Husband updated at bedside.  Disposition: Status is: Inpatient Remains inpatient appropriate because: Severity of disease, IV treatment.  Planned Discharge Destination: Home with Home Health    Time spent: 50 minutes  Author: Sharen Hones, MD 07/02/2022 2:32 PM  For on call review [email or web address].

## 2022-07-02 NOTE — Assessment & Plan Note (Addendum)
Metastatic to bone including skull, iliac bones, thoracic vertebra and ribs S/p recent pathologic rib fracture as well as to right adrenal On palliative radiation and chemotherapy Follows with oncology

## 2022-07-02 NOTE — Consult Note (Signed)
[place] at [facility] [phone] [phone]   Name: Lorraine Mills Date: 07/02/2022 MRN: [id number]  DOB: March 06, 1951  Patient Care Team: [name], MD as PCP - General (Internal Medicine) [name], MD (Internal Medicine) Bary Castilla, Forest Gleason, MD (General Surgery) Telford Nab, RN as Oncology Nurse Navigator    REASON FOR CONSULTATION: Lorraine Mills is a 72 y.o. 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 Recent CT chest, abdomen, pelvis showed significant disease progression after 3 cycles of carbo Alimta Keytruda chemotherapy. There was worsening lung disease with mediastinal adenopathy encroachment of SVC. Patient was referred to radiation oncology. Plan was to initiate weekly CarboTaxol chemotherapy following completion of radiation.  However, patient has been declining over the past several weeks with declining performance status, shortness of breath, poor oral intake.  She was admitted to hospital on 06/13/2022 with CT suggestive of postobstructive pneumonia.  Palliative care was consulted to address goals.  SOCIAL HISTORY:     reports that she quit smoking about 24 years ago. Her smoking use included cigarettes. She has a 24.00 pack-year smoking history. She has never used smokeless tobacco. She reports that she does not currently use alcohol. She reports that she does not use drugs.  Patient is married lives at home with her husband.  ADVANCE DIRECTIVES:  Not on Mills  CODE STATUS: DNR  PAST MEDICAL HISTORY: Past Medical History:  Diagnosis Date   Anxiety    Arthritis    COPD (chronic obstructive pulmonary disease) ([place])    Depression 01/31/2016   Goiter    Hyperlipidemia    TGs   Hypothyroidism    Lung cancer ([place])    Seasonal allergies     PAST SURGICAL HISTORY:  Past Surgical  History:  Procedure Laterality Date   ABDOMINAL HYSTERECTOMY     supracervical    BILATERAL OOPHORECTOMY  2001   BENIGN TUMOR   BREAST BIOPSY Left 1985   cyst    BRONCHIAL NEEDLE ASPIRATION BIOPSY  03/25/2022   Procedure: BRONCHIAL NEEDLE ASPIRATION BIOPSIES;  Surgeon: Lanier Clam, MD;  Location: [facility];  Service: Endoscopy;;   CATARACT EXTRACTION Right    COLONOSCOPY  08/17/2009   COLONOSCOPY WITH PROPOFOL N/A 03/19/2016   Procedure: COLONOSCOPY WITH PROPOFOL;  Surgeon: Robert Bellow, MD;  Location: [facility];  Service: Endoscopy;  Laterality: N/A;   COLONOSCOPY WITH PROPOFOL N/A 05/15/2021   Procedure: COLONOSCOPY WITH PROPOFOL;  Surgeon: Robert Bellow, MD;  Location: [facility] END[facility];  Service: Endoscopy;  Laterality: N/A;   CORNEAL TRANSPLANT Right    ENDOBRONCHIAL ULTRASOUND N/A 03/25/2022   Procedure: ENDOBRONCHIAL ULTRASOUND;  Surgeon: Lanier Clam, MD;  Location: [facility];  Service: Endoscopy;  Laterality: N/A;   EYE SURGERY     IR IMAGING GUIDED PORT INSERTION  03/26/2022   JOINT REPLACEMENT     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   Procedure: TOTAL HIP ARTHROPLASTY ANTERIOR APPROACH;  Surgeon: Hessie Knows, MD;  Location: [facility] ORS;  Service: Orthopedics;  Laterality: Right;   VIDEO BRONCHOSCOPY  03/25/2022   Procedure: VIDEO BRONCHOSCOPY WITHOUT FLUORO;  Surgeon: Lanier Clam, MD;  Location: [facility];  Service: Endoscopy;;    HEMATOLOGY/ONCOLOGY HISTORY:  Oncology History  Lung cancer metastatic to bone [facility])  03/21/2022 Initial Diagnosis   Lung cancer metastatic to bone ([place])   04/03/2022 Cancer Staging   Staging  form: Lung, AJCC 8th Edition - Clinical stage from 04/03/2022: Stage IVB (cT1a, cN2, cM1c) - Signed by Sindy Guadeloupe, MD on 04/03/2022   04/09/2022 - 05/28/2022 Chemotherapy   Patient is on Treatment Plan : LUNG NSCLC Pemetrexed + Carboplatin q21d x 4 Cycles     07/02/2022 -  Chemotherapy   Patient is  on Treatment Plan : NSCLC Carboplatin + Paclitaxel Weekly X 6 Weeks with XRT       ALLERGIES:  is allergic to latex and levofloxacin.  MEDICATIONS:  Current Facility-Administered Medications  Medication Dose Route Frequency Provider Last Rate Last Admin   0.9 %  sodium chloride infusion   Intravenous Continuous Sharen Hones, MD 50 mL/hr at 07/02/22 1451 New Bag at 07/02/22 1451   acetaminophen (TYLENOL) tablet 650 mg  650 mg Oral Q6H PRN Athena Masse, MD       Or   acetaminophen (TYLENOL) suppository 650 mg  650 mg Rectal Q6H PRN Athena Masse, MD       albuterol (PROVENTIL) (2.5 MG/3ML) 0.083% nebulizer solution 2.5 mg  2.5 mg Nebulization Q2H PRN Athena Masse, MD       ALPRAZolam Duanne Moron) tablet 0.5 mg  0.5 mg Oral BID PRN Athena Masse, MD       enoxaparin (LOVENOX) injection 30 mg  30 mg Subcutaneous Q24H Judd Gaudier V, MD   30 mg at 07/02/22 1003   fentaNYL (DURAGESIC) 12 MCG/HR 1 patch  1 patch Transdermal Q72H Athena Masse, MD   1 patch at 07/02/22 0930   guaiFENesin (MUCINEX) 12 hr tablet 600 mg  600 mg Oral BID Judd Gaudier V, MD   600 mg at 07/02/22 0523   ipratropium-albuterol (DUONEB) 0.5-2.5 (3) MG/3ML nebulizer solution 3 mL  3 mL Nebulization Q6H Judd Gaudier V, MD   3 mL at 07/02/22 1315   levothyroxine (SYNTHROID) tablet 75 mcg  75 mcg Oral Q0600 Athena Masse, MD   75 mcg at 07/02/22 0523   ondansetron (ZOFRAN) tablet 4 mg  4 mg Oral Q6H PRN Athena Masse, MD       Or   ondansetron Scnetx) injection 4 mg  4 mg Intravenous Q6H PRN Athena Masse, MD       oxyCODONE (Oxy IR/ROXICODONE) immediate release tablet 5-10 mg  5-10 mg Oral Q4H PRN Athena Masse, MD       pantoprazole (PROTONIX) EC tablet 40 mg  40 mg Oral Daily Judd Gaudier V, MD   40 mg at 07/02/22 1001   piperacillin-tazobactam (ZOSYN) IVPB 3.375 g  3.375 g Intravenous Q8H Tollie Eth F, RPH       rosuvastatin (CRESTOR) tablet 10 mg  10 mg Oral Daily Athena Masse, MD   10 mg at  07/02/22 1000   Current Outpatient Medications  Medication Sig Dispense Refill   acetaminophen (TYLENOL) 500 MG tablet Take 1,000 mg by mouth every 6 (six) hours as needed for moderate pain.     albuterol (PROVENTIL) (2.5 MG/3ML) 0.083% nebulizer solution Inhale 2.5 mg into the lungs every 6 (six) hours.     albuterol (VENTOLIN HFA) 108 (90 Base) MCG/ACT inhaler Inhale 2 puffs into the lungs every 6 (six) hours as needed for wheezing or shortness of breath. 8 g 2   ALPRAZolam (XANAX) 0.5 MG tablet Take 1 tablet (0.5 mg total) by mouth 2 (two) times daily as needed for anxiety. 60 tablet 2   fenofibrate micronized (LOFIBRA) 134 MG capsule Take 1  capsule (134 mg total) by mouth daily before breakfast. 30 capsule 11   fentaNYL (DURAGESIC) 12 MCG/HR Place 1 patch onto the skin every 3 (three) days. 10 patch 0   levothyroxine (SYNTHROID) 75 MCG tablet TAKE 1 TABLET EVERY DAY ON EMPTY STOMACHWITH A GLASS OF WATER AT LEAST 30-60 MINBEFORE BREAKFAST (Patient taking differently: Take 75 mcg by mouth daily before breakfast. On an empty stomach with a glass of water at least 30-60 minutes before breakfast.) 90 tablet 1   omeprazole (PRILOSEC) 20 MG capsule TAKE 1 CAPSULE BY MOUTH DAILY (Patient taking differently: Take 20 mg by mouth daily as needed.) 30 capsule 3   ondansetron (ZOFRAN) 4 MG tablet Take 1 tablet (4 mg total) by mouth every 6 (six) hours as needed for nausea. 30 tablet 1   oxyCODONE (OXY IR/ROXICODONE) 5 MG immediate release tablet Take 1-2 tablets (5-10 mg total) by mouth every 4 (four) hours as needed (pain). 60 tablet 0   potassium chloride (KLOR-CON M) 10 MEQ tablet Take 1 tablet (10 mEq total) by mouth daily. 10 tablet 0   prednisoLONE acetate (PRED FORTE) 1 % ophthalmic suspension Place 1 drop into the right eye daily.     RESTASIS 0.05 % ophthalmic emulsion Place 1 drop into both eyes 2 (two) times daily.     rosuvastatin (CRESTOR) 10 MG tablet TAKE ONE TABLET EVERY DAY. (Patient taking  differently: Take 10 mg by mouth daily.) 90 tablet 3   mirtazapine (REMERON) 15 MG tablet Take 1 tablet (15 mg total) by mouth at bedtime. (Patient not taking: Reported on 07/02/2022) 30 tablet 1   OLANZapine (ZYPREXA) 10 MG tablet Take 1 tablet (10 mg total) by mouth at bedtime. (Patient not taking: Reported on 07/02/2022) 30 tablet 2   promethazine (PHENERGAN) 12.5 MG tablet Take 1 tablet (12.5 mg total) by mouth every 8 (eight) hours as needed for nausea or vomiting. (Patient not taking: Reported on 04/09/2022) 30 tablet 0    VITAL SIGNS: BP (!) 106/54   Pulse 62   Temp 98 F (36.7 C)   Resp (!) 25   Ht 5\' 1"  (1.549 m)   Wt 148 lb (67.1 kg)   SpO2 99%   BMI 27.96 kg/m  Filed Weights   06/27/2022 2300  Weight: 148 lb (67.1 kg)    Estimated body mass index is 27.96 kg/m as calculated from the following:   Height as of this encounter: 5\' 1"  (1.549 m).   Weight as of this encounter: 148 lb (67.1 kg).  LABS: CBC:    Component Value Date/Time   WBC 21.1 (H) 07/02/2022 0505   HGB 8.7 (L) 07/02/2022 0505   HCT 28.2 (L) 07/02/2022 0505   PLT 205 07/02/2022 0505   MCV 94.0 07/02/2022 0505   NEUTROABS 18.0 (H) 06/21/2022 2322   LYMPHSABS 0.8 06/09/2022 2322   MONOABS 1.8 (H) 07/06/2022 2322   EOSABS 0.2 07/03/2022 2322   BASOSABS 0.1 07/03/2022 2322   Comprehensive Metabolic Panel:    Component Value Date/Time   NA 136 06/24/2022 2322   NA 142 10/02/2020 0938   K 3.7 06/10/2022 2322   CL 97 (L) 06/14/2022 2322   CO2 27 06/26/2022 2322   BUN 19 07/05/2022 2322   BUN 22 10/02/2020 0938   CREATININE 1.76 (H) 07/02/2022 0505   CREATININE 0.95 01/29/2016 1429   GLUCOSE 161 (H) 06/29/2022 2322   CALCIUM 8.3 (L) 06/09/2022 2322   AST 21 06/11/2022 2322   ALT 11 07/09/2022 2322  ALKPHOS 127 (H) 06/11/2022 2322   BILITOT 0.5 06/10/2022 2322   PROT 7.3 06/30/2022 2322   ALBUMIN 2.4 (L) 06/23/2022 2322    RADIOGRAPHIC STUDIES: CT Angio Chest PE W and/or Wo Contrast  Result  Date: 07/02/2022 CLINICAL DATA:  Concern for pulmonary embolism. Worsening shortness of breath. History of metastatic lung cancer. EXAM: CT ANGIOGRAPHY CHEST WITH CONTRAST TECHNIQUE: Multidetector CT imaging of the chest was performed using the standard protocol during bolus administration of intravenous contrast. Multiplanar CT image reconstructions and MIPs were obtained to evaluate the vascular anatomy. RADIATION DOSE REDUCTION: This exam was performed according to the departmental dose-optimization program which includes automated exposure control, adjustment of the mA and/or kV according to patient size and/or use of iterative reconstruction technique. CONTRAST:  31mL OMNIPAQUE IOHEXOL 350 MG/ML SOLN COMPARISON:  Chest CT dated 06/17/2022. FINDINGS: Cardiovascular: There is no cardiomegaly. No significant pericardial effusion. Mild atherosclerotic calcification of the thoracic aorta. No aneurysmal dilatation or dissection. The origins of the great vessels of the aortic arch appear patent. Evaluation of the pulmonary arteries is limited due to suboptimal opacification and timing of the contrast. Apparent focal nonopacification of a branch of the pulmonary artery in the left lower lobe (70/5) likely artifactual and related to streak artifact. No large or central pulmonary artery embolus identified. Right-sided Port-A-Cath with tip close to the cavoatrial junction. Mediastinum/Nodes: Right hilar mass extending into the mediastinum and measuring approximately 6.6 x 6.1 cm and progressed since the prior CT. Mediastinal adenopathy in the prevascular space measure up to 1.3 cm also progressed since the prior CT. No mediastinal fluid collection. Lungs/Pleura: There is high-grade narrowing of the right middle lobe bronchus due to compression by the right hilar mass. There is large area of consolidation involving the right middle and right lower lobes which may represent postobstructive atelectasis or infiltrate versus  mass. Scattered bilateral upper lobe pulmonary nodularity consistent with metastatic disease. There is diffuse interstitial and interlobular septal thickening in the right upper lobe which may represent edema. Lymphangitic spread of tumor is not excluded. Small bilateral pleural effusions. No pneumothorax. The central airways remain patent. Upper Abdomen: Indeterminate 13 mm hypoenhancing lesion in the right lobe of the liver (95/5). There is layering sludge and small stones. Musculoskeletal: Osteopenia with degenerative changes of the spine. Multiple in osseous lucent lesions consistent with metastatic disease. Review of the MIP images confirms the above findings. IMPRESSION: 1. No large or central pulmonary artery embolus identified. 2. Interval progression of the right hilar mass and mediastinal adenopathy. 3. Large area of consolidation involving the right middle and right lower lobes may represent postobstructive atelectasis or infiltrate versus mass. 4. Scattered bilateral upper lobe pulmonary nodularity consistent with metastatic disease. 5. Small bilateral pleural effusions. 6. Osseous metastatic disease. 7. Indeterminate 13 mm hypoenhancing lesion in the right lobe of the liver. 8. Cholelithiasis. 9.  Aortic Atherosclerosis (ICD10-I70.0). Electronically Signed   By: Anner Crete M.D.   On: 07/02/2022 01:19   DG Chest Portable 1 View  Result Date: 06/09/2022 CLINICAL DATA:  Shortness of breath EXAM: PORTABLE CHEST 1 VIEW COMPARISON:  Chest x-ray 06/25/2022 FINDINGS: Right chest port catheter tip projects over the SVC, unchanged. There are patchy airspace opacities in the right mid and lower lung which have mildly increased. Small right pleural effusion persists. There central pulmonary vascular congestion and central interstitial prominence which has increased. There is no pneumothorax. The cardiomediastinal silhouette is stable. No acute fractures are seen. IMPRESSION: 1. Increasing airspace  opacities in  the right mid and lower lung. 2. Stable small right pleural effusion. 3. Increasing central pulmonary vascular congestion and central interstitial prominence, likely edema. Electronically Signed   By: Ronney Asters M.D.   On: 06/30/2022 23:31   DG Chest 2 View  Result Date: 06/25/2022 CLINICAL DATA:  Shortness of breath. History of metastatic lung cancer on chemotherapy. EXAM: CHEST - 2 VIEW COMPARISON:  Chest two views 04/25/2022, CT chest 06/17/2022 FINDINGS: Right chest wall porta catheter tip overlies the central superior vena cava. Cardiac silhouette and mediastinal contours are unchanged. Note is made of a right hilar mass better seen on recent 06/17/2021 CT. Moderate calcification within aortic arch. Moderate elevation of the right hemidiaphragm. Small right pleural effusion and inferolateral pleural thickening is increased from 04/25/2022 radiograph and consistent with findings on recent 06/17/2022 CT. Mild bilateral mid and lower lung interstitial thickening. No pneumothorax. Mild-to-moderate multilevel degenerative disc changes of the thoracic spine. IMPRESSION: 1. Mild bilateral mid and lower lung interstitial thickening, possible mild interstitial pulmonary edema. 2. Small right pleural effusion and inferolateral pleural thickening, increased from 04/25/2022 radiograph and consistent with findings on recent 06/17/2022 CT. 3. Note is made of a right hilar mass better seen on recent 06/17/2021 CT. Electronically Signed   By: Yvonne Kendall M.D.   On: 06/25/2022 12:57   CT CHEST ABDOMEN PELVIS W CONTRAST  Result Date: 06/17/2022 CLINICAL DATA:  Metastatic lung cancer. Current chemotherapy patient. Shortness of breath. * Tracking Code: BO * EXAM: CT CHEST, ABDOMEN, AND PELVIS WITH CONTRAST TECHNIQUE: Multidetector CT imaging of the chest, abdomen and pelvis was performed following the standard protocol during bolus administration of intravenous contrast. RADIATION DOSE REDUCTION: This exam  was performed according to the departmental dose-optimization program which includes automated exposure control, adjustment of the mA and/or kV according to patient size and/or use of iterative reconstruction technique. CONTRAST:  130mL OMNIPAQUE IOHEXOL 300 MG/ML  SOLN COMPARISON:  03/20/2022 FINDINGS: CT CHEST FINDINGS Cardiovascular: Right Port-A-Cath tip: Lower SVC. Progressive right hilar and mediastinal mass causes worsened narrowing of the right upper lobe and right lower lobe pulmonary arteries (image 22 and 26 of series 3, respectively) and increased encroachment and extrinsic compression of the lower SVC (image 24, series 3). Aortic arch atherosclerotic vascular disease.  Mild cardiomegaly. Mediastinum/Nodes: As noted above, there is progressive confluent right hilar and mediastinal adenopathy contributing to narrowing of the trachea at the carina (image 19, series 3) and narrowing of the bronchus intermedius (image 24, series 3). Tracheomalacia may also be contributing to the tracheal narrowing given the somewhat slit-like cross-sectional contour of the lower trachea. Overall central right tumor measures about 5.1 by 5.7 cm on image 24 series 3, previously this was made up of individual nodes measuring up to about 2.3 cm individually. A right lower paratracheal node measuring 1.9 cm in short axis on image 19 series 3 previously measured about 2.0 cm. A right internal mammary lymph node currently measures 1.9 cm in short axis on image 29 series 3 (formerly 1.1 cm) and invades the third intercostal space. Lungs/Pleura: Small right pleural effusion with possible tethering of the visceral and parietal pleura on the right on image 27 of series 3. Trace left pleural effusion. Marked airway narrowing and plugging in the right upper lobe with secondary pulmonary lobular septal thickening and ground-glass opacities throughout much of the right upper lobe. Substantial atelectasis in the right middle lobe and right  lower lobe with marked airway narrowing. On the left side there is a  somewhat lesser degree of secondary pulmonary lobular septal thickening but with scattered ground-glass pulmonary nodularity and scattered additional potential metastatic nodularity. This includes a new 1.2 by 1.0 cm nodule anteriorly in the left upper lobe on image 51 series 4 and scattered smaller nodules along the left pleural surfaces, in the left upper lobe, and left lower lobe. Musculoskeletal: Progressive lytic destruction of the right fourth rib, with a new soft tissue mass component measuring 3.0 by 1.6 cm in the right chest wall on image 24 series 3. Probably pathologic fracture of the right eleventh rib posteriorly on image 48 of series 3 is new and associated with an abnormal rib lucency which is likewise new. Small lytic lesion of the left posterior sternal manubrium adjacent to the sternoclavicular joint. Lytic lesion in the right distal clavicle. Lucent lesions at T7, T9, and T11 in the thoracic spine compatible with new thoracic spine metastatic lesions. There is also some left posterior vertebral body and pedicle lytic involvement at the T7 vertebra. Right lower axillary lymph node or posterior breast nodule is enhancing and measures 1.3 cm in diameter on image 39 of series 3, new compared to the prior exam likely representing malignancy. A new small enhancing nodule in the left lower breast measures 0.7 cm in diameter on image 41 series 3. Subcutaneous nodule posterior to the left latissimus dorsi muscle measuring 1.2 by 0.8 cm on image 30 series 3, new compared to the prior exam and suspicious for subcutaneous metastatic lesion. CT ABDOMEN PELVIS FINDINGS Hepatobiliary: Unremarkable Pancreas: Unremarkable Spleen: There are 2-3 new small hypodense lesions in the spleen which are technically nonspecific but given the clinical context raise some concern for potential early metastatic disease. A 0.7 cm lesion in the lower spleen on  image 52 of series 3 represents an index lesion. Adrenals/Urinary Tract: Right adrenal mass 3.3 by 1.6 cm on image 47 series 3, formerly 3.7 by 2.0 cm. Appearance suspicious for malignancy but mildly improved from previous. Kidneys unremarkable. Stomach/Bowel: Sigmoid colon diverticulosis. Vascular/Lymphatic: Atherosclerosis is present, including aortoiliac atherosclerotic disease. Reproductive: Partially obscured by streak artifact from the right hip implant. Suspected hysterectomy. Other: New 0.8 by 0.5 cm soft tissue nodule along the lower omentum, image 98 of series 3. New right lower quadrant omental nodule, 0.9 by 0.8 cm on image 88 series 3. Musculoskeletal: Lytic metastatic lesions particularly at L1, L4, L5, in the right iliac bone (image 130, series 5), and in the left iliac bone (image 95, series 5). These are new and/or substantially enlarged compared to previous. New soft tissue mass in the left gluteus maximus muscle, 2.4 by 1.6 cm on image 98 series 3, compatible with metastatic lesion. 0.8 cm nodule along the superficial fascia margin of the gluteal musculature on the right on image 93 series 3. Multifocal rim enhancing nodularity in the lumbar paraspinal musculature, including a 1.1 cm in diameter lesion in the left paraspinal musculature on image 61 series 3, compatible with additional intramuscular metastatic lesions. IMPRESSION: 1. Significant progression of malignancy, with enlargement of the right hilar and mediastinal mass, progressive confluent right hilar and mediastinal adenopathy, new bilateral pulmonary nodules, new bilateral pleural effusions, new subcutaneous and intramuscular metastatic lesions, and new and/or enlarging lytic osseous metastatic lesions. 2. There is also increased narrowing of the trachea at the carina and narrowing of the bronchus intermedius due to extrinsic compression from the right hilar and mediastinal mass. There is also considerable airway narrowing and plugging  in the right upper lobe. 3. New  small hypodense lesions in the spleen are technically nonspecific but raise the possibility of early metastatic disease. 4. Reduced size of the right adrenal mass, currently 3.3 by 1.6 cm, previously 3.7 by 2.0 cm. 5. New small right and trace left pleural effusions. The right pleural effusion may be partially loculated and there is some tethering of the visceral and parietal pleura on the right. Trace left pleural effusion. 6. Progressive lytic involvement of the right fourth rib and right eleventh rib. 7. New small soft tissue nodules in the subcutaneous tissues posterior to the left latissimus dorsi muscle and in the right gluteal musculature compatible with subcutaneous metastatic lesions. New small enhancing inferior breast nodules, likely metastatic. 8. Aortic atherosclerosis. Aortic Atherosclerosis (ICD10-I70.0). Electronically Signed   By: Van Clines M.D.   On: 06/17/2022 19:45   DG Sniff Test  Result Date: 06/04/2022 CLINICAL DATA:  Shortness of breath, right lung cancer EXAM: CHEST FLUOROSCOPY TECHNIQUE: Real-time fluoroscopic evaluation of the chest was performed. FLUOROSCOPY: Radiation Exposure Index (as provided by the fluoroscopic device): 2.9 mGy COMPARISON:  CT chest 03/20/2022 FINDINGS: Real-time fluoroscopy of the diaphragms was performed during inspiration, expiration and sniffing. Elevation of the right diaphragm. Right basilar atelectasis. No significant diaphragmatic motion during inspiration, expiration or sniffing. Normal left diaphragmatic motion during inspiration, expiration and sniffing. IMPRESSION: 1. No significant right diaphragmatic motion during inspiration, expiration or sniffing consistent with right diaphragmatic paralysis. Electronically Signed   By: Kathreen Devoid M.D.   On: 06/04/2022 12:38    PERFORMANCE STATUS (ECOG) : 4 - Bedbound  Review of Systems Unless otherwise noted, a complete review of systems is negative.  Physical  Exam General: Frail appearing Pulmonary: clear ant fields, on 8 L O2 Extremities: no edema, no joint deformities Skin: no rashes Neurological: Weakness but otherwise nonfocal  IMPRESSION: Patient seen in the ED.  I spoke with patient and husband.  Together, we discussed her overall decline over the past several weeks now with CT suggestive of postobstructive pneumonia, likely secondary to disease progression.  Unfortunately, it does not appear that patient's performance status and oxygen requirements would allow further cancer treatment at this point.  We discussed the option of transitioning to comfort care versus medically maximizing and involving hospice either at home or a hospice IPU.  Patient understandably seemed somewhat overwhelmed with this news and husband asked for some time to process prior to making any decisions.  Patient and husband have decided on DNR/DNI.  PLAN: -Continue current scope of treatment -Patient and husband are considering goals  Case and plan discussed with Dr. Janese Banks  Time Total: 30 minutes  Visit consisted of counseling and education dealing with the complex and emotionally intense issues of symptom management and palliative care in the setting of serious and potentially life-threatening illness.Greater than 50%  of this time was spent counseling and coordinating care related to the above assessment and plan.  Signed by: Altha Harm, PhD, NP-C

## 2022-07-02 NOTE — Hospital Course (Signed)
Lorraine Mills is a 72 y.o. 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  Patient came to the hospital with worsening shortness of breath, she developed severe hypoxemia, was placed on 10 L oxygen. CTscan showed right hilar mass, with large consolidation in the right lower lobe and right middle lobe, this is consistent with postobstructive pneumonia.  Patient was started on cefepime. Seen by oncology and apparently care, recommended hospice care.  Patient condition worsened on 1/25, transition to comfort care.

## 2022-07-02 NOTE — ED Notes (Signed)
US at bedside

## 2022-07-02 NOTE — ED Notes (Signed)
Assumed care of pt. Pt resting comfortably in bed at this time. Pt denies any current needs or questions. Call light with in reach. Pt family sitting with pt.

## 2022-07-02 NOTE — Assessment & Plan Note (Addendum)
Postobstructive pneumonia Acute on chronic respiratory failure with hypoxia Patient with increased work of breathing requiring HFNC in the ED.  Was in the 73s on room air with EMS CTA shows "Large area of consolidation involving the right middle and right lower lobes may represent postobstructive atelectasis or infiltrate versus mass" Continue cefepime and azithromycin Antitussives, albuterol Supplemental oxygen.

## 2022-07-02 NOTE — H&P (Signed)
History and Physical    Patient: Lorraine Mills ION:[id number] DOB: 1950-06-18 DOA: 06/18/2022 DOS: the patient was seen and examined on 07/02/2022 PCP: [name], MD  Patient coming from: Home  Chief Complaint:  Chief Complaint  Patient presents with   Shortness of Breath    HPI: Lorraine Mills is a 72 y.o. 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  At baseline patient is on O2 at 2 L and on arrival of EMS she was satting in the 80s but on room air and she was placed on 4 L.  By arrival O2 sat was in the high 90s on 4 L but due to increased work of breathing she was placed on high flow nasal cannula..  She was tachycardic and tachypneic with otherwise normal vitals.  VBG was unremarkable.  Troponin 15 and BNP 133.  WBC 21,000.  Hemoglobin 9 and creatinine 1.68 above baseline of 1.16 EKG, personally viewed and interpreted shows sinus at 130 with no ischemic ST-T wave changes.  CTA chest showed interval progression of right hilar mass and mediastinal adenopathy and a large area of consolidation involving right middle and right lower lobes representing postobstructive atelectasis infiltrate versus mass further details as follows:  IMPRESSION: 1. No large or central pulmonary artery embolus identified. 2. Interval progression of the right hilar mass and mediastinal adenopathy. 3. Large area of consolidation involving the right middle and right lower lobes may represent postobstructive atelectasis or infiltrate versus mass. 4. Scattered bilateral upper lobe pulmonary nodularity consistent with  metastatic disease. 5. Small bilateral pleural effusions. 6. Osseous metastatic disease. 7. Indeterminate 13 mm hypoenhancing lesion in the right lobe of the liver. 8. Cholelithiasis. 9.  Aortic Atherosclerosis (ICD10-I70.0).  Patient started on cefepime and treated with DuoNebs.  Hospitalist consulted for admission.   Review of Systems: As mentioned in the history of present illness. All other systems reviewed and are negative.  Past Medical History:  Diagnosis Date   Anxiety    Arthritis    COPD (chronic obstructive pulmonary disease) ([place])    Depression 01/31/2016   Goiter    Hyperlipidemia    TGs   Hypothyroidism    Lung cancer ([place])    Seasonal allergies    Past Surgical History:  Procedure Laterality Date   ABDOMINAL HYSTERECTOMY     supracervical    BILATERAL OOPHORECTOMY  2001   BENIGN TUMOR   BREAST BIOPSY Left 1985   cyst    BRONCHIAL NEEDLE ASPIRATION BIOPSY  03/25/2022   Procedure: BRONCHIAL NEEDLE ASPIRATION BIOPSIES;  Surgeon: Lanier Clam, MD;  Location: [facility];  Service: Endoscopy;;   CATARACT EXTRACTION Right    COLONOSCOPY  08/17/2009   COLONOSCOPY WITH PROPOFOL N/A 03/19/2016   Procedure: COLONOSCOPY WITH PROPOFOL;  Surgeon: Robert Bellow, MD;  Location: [facility];  Service: Endoscopy;  Laterality: N/A;   COLONOSCOPY WITH PROPOFOL N/A 05/15/2021   Procedure: COLONOSCOPY WITH PROPOFOL;  Surgeon: Robert Bellow, MD;  Location: [facility];  Service: Endoscopy;  Laterality: N/A;   CORNEAL TRANSPLANT Right    ENDOBRONCHIAL ULTRASOUND N/A 03/25/2022   Procedure: ENDOBRONCHIAL ULTRASOUND;  Surgeon: Lanier Clam, MD;  Location: [facility];  Service: Endoscopy;  Laterality: N/A;   EYE SURGERY     IR IMAGING GUIDED PORT INSERTION  03/26/2022   JOINT REPLACEMENT     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   Procedure: TOTAL HIP ARTHROPLASTY ANTERIOR APPROACH;  Surgeon: Hessie Knows, MD;  Location: [facility];  Service:  Orthopedics;  Laterality: Right;   VIDEO BRONCHOSCOPY  03/25/2022   Procedure: VIDEO BRONCHOSCOPY WITHOUT FLUORO;  Surgeon: Lanier Clam, MD;  Location: [facility];  Service: Endoscopy;;   Social History:  reports that she quit smoking about 24 years ago. Her smoking use included cigarettes. She has a 24.00 pack-year smoking history. She has never used smokeless tobacco. She reports that she does not currently use alcohol. She reports that she does not use drugs.  Allergies  Allergen Reactions   Latex Rash   Levofloxacin     FLUOROQUINOLONES - unknown reaction    Family History  Problem Relation Age of Onset   Cancer Mother        OVARIAN   Stroke Father    Cancer Sister        breast   Atrial fibrillation Sister    Atrial fibrillation Brother    Thyroid disease Other        MULTIPLE FAMILY MEMBERS    Prior to Admission medications   Medication Sig Start Date End Date Taking? Authorizing Provider  acetaminophen (TYLENOL) 500 MG tablet Take 1,000 mg by mouth every 6 (six) hours as needed for moderate pain.    [provider]  albuterol (PROVENTIL) (2.5 MG/3ML) 0.083% nebulizer solution Inhale into the lungs. 06/05/22 06/05/23  [provider]  albuterol (VENTOLIN HFA) 108 (90 Base) MCG/ACT inhaler Inhale 2 puffs into the lungs every 6 (six) hours as needed for wheezing or shortness of breath. 05/07/22   Sindy Guadeloupe, MD  ALPRAZolam Duanne Moron) 0.5 MG tablet Take 1 tablet (0.5 mg total) by mouth 2 (two) times daily as needed for anxiety. 04/01/22   [name], MD  fenofibrate micronized (LOFIBRA) 134 MG capsule Take 1 capsule (134 mg total) by mouth daily before breakfast. 02/06/22 02/01/23  [name], MD  fentaNYL (DURAGESIC) 12 MCG/HR Place 1 patch onto the skin every 3 (three) days. 06/21/22   Jane Canary, MD  levothyroxine (SYNTHROID) 75 MCG tablet TAKE 1 TABLET EVERY DAY ON EMPTY STOMACHWITH A GLASS OF WATER AT LEAST 30-60 [place]  BREAKFAST Patient taking differently: Take 75 mcg by mouth daily before breakfast. On an empty stomach with a glass of water at least 30-60 minutes before breakfast. 12/22/21   Dutch Quint B, FNP  mirtazapine (REMERON) 15 MG tablet Take 1 tablet (15 mg total) by mouth at bedtime. 05/29/22   Sindy Guadeloupe, MD  OLANZapine (ZYPREXA) 10 MG tablet Take 1 tablet (10 mg total) by mouth at bedtime. 05/07/22   Sindy Guadeloupe, MD  omeprazole (PRILOSEC) 20 MG capsule TAKE 1 CAPSULE BY MOUTH DAILY Patient taking differently: Take 20 mg by mouth daily. 01/28/21   [name], MD  ondansetron (ZOFRAN) 4 MG tablet Take 1 tablet (4 mg total) by mouth every 6 (six) hours as needed for nausea. 03/27/22   Danford, Suann Larry, MD  oxyCODONE (OXY IR/ROXICODONE) 5 MG immediate release tablet Take 1-2 tablets (5-10 mg total) by mouth every 4 (four) hours as needed (pain). 06/25/22   Borders, Kirt Boys, NP  potassium chloride (KLOR-CON M) 10 MEQ tablet Take  1 tablet (10 mEq total) by mouth daily. 06/27/22   Borders, Kirt Boys, NP  prednisoLONE acetate (PRED FORTE) 1 % ophthalmic suspension Place 1 drop into the right eye daily. 07/03/20   [provider]  promethazine (PHENERGAN) 12.5 MG tablet Take 1 tablet (12.5 mg total) by mouth every 8 (eight) hours as needed for nausea or vomiting. Patient not taking: Reported on 04/09/2022 04/01/22   [name], MD  RESTASIS 0.05 % ophthalmic emulsion Place 1 drop into both eyes 2 (two) times daily. 09/30/21   [provider]  rosuvastatin (CRESTOR) 10 MG tablet TAKE ONE TABLET EVERY DAY. Patient taking differently: Take 10 mg by mouth daily. 03/06/22   [name], MD    Physical Exam: Vitals:   07/06/2022 2300 06/17/2022 2302 06/29/2022 2350  BP:  113/61   Pulse:  (!) 120   Resp:  18   Temp:  98.4 F (36.9 C)   TempSrc:  Oral   SpO2:  95% 98%  Weight: 67.1 kg    Height: 5\' 1"  (1.549 m)     Physical Exam Vitals and nursing note reviewed.   Constitutional:      General: She is not in acute distress.    Comments: Frail-appearing  HENT:     Head: Normocephalic and atraumatic.  Cardiovascular:     Rate and Rhythm: Regular rhythm. Tachycardia present.     Heart sounds: Normal heart sounds.  Pulmonary:     Effort: Pulmonary effort is normal.     Breath sounds: Wheezing and rales present.  Abdominal:     Palpations: Abdomen is soft.     Tenderness: There is no abdominal tenderness.  Neurological:     Mental Status: Mental status is at baseline.     Labs on Admission: I have personally reviewed following labs and imaging studies  CBC: Recent Labs  Lab 06/25/22 0803 06/27/22 0850 07/07/2022 2322  WBC 15.9* 16.4* 21.2*  NEUTROABS 12.8* 13.6* 18.0*  HGB 9.9* 9.5* 9.0*  HCT 31.4* 30.3* 29.8*  MCV 93.2 93.2 94.3  PLT 308 222 297   Basic Metabolic Panel: Recent Labs  Lab 06/25/22 0803 06/27/22 0850 07/09/2022 2322  NA 136 139 136  K 3.2* 3.2* 3.7  CL 100 105 97*  CO2 23 24 27   GLUCOSE 144* 152* 161*  BUN 11 14 19   CREATININE 1.29* 1.48* 1.68*  CALCIUM 8.3* 8.2* 8.3*  MG  --  2.0 1.9   GFR: Estimated Creatinine Clearance: 26.9 mL/min (A) (by C-G formula based on SCr of 1.68 mg/dL (H)). Liver Function Tests: Recent Labs  Lab 06/25/22 0803 06/27/22 0850 06/29/2022 2322  AST 19 20 21   ALT 11 10 11   ALKPHOS 98 102 127*  BILITOT 0.6 0.8 0.5  PROT 7.5 7.4 7.3  ALBUMIN 2.7* 2.7* 2.4*   No results for input(s): "LIPASE", "AMYLASE" in the last 168 hours. No results for input(s): "AMMONIA" in the last 168 hours. Coagulation Profile: No results for input(s): "INR", "PROTIME" in the last 168 hours. Cardiac Enzymes: No results for input(s): "CKTOTAL", "CKMB", "CKMBINDEX", "TROPONINI" in the last 168 hours. BNP (last 3 results) No results for input(s): "PROBNP" in the last 8760 hours. HbA1C: No results for input(s): "HGBA1C" in the last 72 hours. CBG: No results for input(s): "GLUCAP" in the last 168  hours. Lipid Profile: No results for input(s): "CHOL", "HDL", "LDLCALC", "TRIG", "CHOLHDL", "LDLDIRECT" in the last 72 hours. Thyroid Function Tests: No results for input(s): "TSH", "T4TOTAL", "FREET4", "T3FREE", "THYROIDAB"  in the last 72 hours. Anemia Panel: No results for input(s): "VITAMINB12", "FOLATE", "FERRITIN", "TIBC", "IRON", "RETICCTPCT" in the last 72 hours. Urine analysis:    Component Value Date/Time   COLORURINE YELLOW (A) 05/12/2022 1453   APPEARANCEUR HAZY (A) 05/12/2022 1453   LABSPEC 1.015 05/12/2022 1453   PHURINE 6.0 05/12/2022 1453   GLUCOSEU NEGATIVE 05/12/2022 1453   HGBUR MODERATE (A) 05/12/2022 1453   BILIRUBINUR NEGATIVE 05/12/2022 1453   KETONESUR NEGATIVE 05/12/2022 1453   PROTEINUR NEGATIVE 05/12/2022 1453   NITRITE NEGATIVE 05/12/2022 1453   LEUKOCYTESUR LARGE (A) 05/12/2022 1453    Radiological Exams on Admission: CT Angio Chest PE W and/or Wo Contrast  Result Date: 07/02/2022 CLINICAL DATA:  Concern for pulmonary embolism. Worsening shortness of breath. History of metastatic lung cancer. EXAM: CT ANGIOGRAPHY CHEST WITH CONTRAST TECHNIQUE: Multidetector CT imaging of the chest was performed using the standard protocol during bolus administration of intravenous contrast. Multiplanar CT image reconstructions and MIPs were obtained to evaluate the vascular anatomy. RADIATION DOSE REDUCTION: This exam was performed according to the departmental dose-optimization program which includes automated exposure control, adjustment of the mA and/or kV according to patient size and/or use of iterative reconstruction technique. CONTRAST:  46mL OMNIPAQUE IOHEXOL 350 MG/ML SOLN COMPARISON:  Chest CT dated 06/17/2022. FINDINGS: Cardiovascular: There is no cardiomegaly. No significant pericardial effusion. Mild atherosclerotic calcification of the thoracic aorta. No aneurysmal dilatation or dissection. The origins of the great vessels of the aortic arch appear patent.  Evaluation of the pulmonary arteries is limited due to suboptimal opacification and timing of the contrast. Apparent focal nonopacification of a branch of the pulmonary artery in the left lower lobe (70/5) likely artifactual and related to streak artifact. No large or central pulmonary artery embolus identified. Right-sided Port-A-Cath with tip close to the cavoatrial junction. Mediastinum/Nodes: Right hilar mass extending into the mediastinum and measuring approximately 6.6 x 6.1 cm and progressed since the prior CT. Mediastinal adenopathy in the prevascular space measure up to 1.3 cm also progressed since the prior CT. No mediastinal fluid collection. Lungs/Pleura: There is high-grade narrowing of the right middle lobe bronchus due to compression by the right hilar mass. There is large area of consolidation involving the right middle and right lower lobes which may represent postobstructive atelectasis or infiltrate versus mass. Scattered bilateral upper lobe pulmonary nodularity consistent with metastatic disease. There is diffuse interstitial and interlobular septal thickening in the right upper lobe which may represent edema. Lymphangitic spread of tumor is not excluded. Small bilateral pleural effusions. No pneumothorax. The central airways remain patent. Upper Abdomen: Indeterminate 13 mm hypoenhancing lesion in the right lobe of the liver (95/5). There is layering sludge and small stones. Musculoskeletal: Osteopenia with degenerative changes of the spine. Multiple in osseous lucent lesions consistent with metastatic disease. Review of the MIP images confirms the above findings. IMPRESSION: 1. No large or central pulmonary artery embolus identified. 2. Interval progression of the right hilar mass and mediastinal adenopathy. 3. Large area of consolidation involving the right middle and right lower lobes may represent postobstructive atelectasis or infiltrate versus mass. 4. Scattered bilateral upper lobe  pulmonary nodularity consistent with metastatic disease. 5. Small bilateral pleural effusions. 6. Osseous metastatic disease. 7. Indeterminate 13 mm hypoenhancing lesion in the right lobe of the liver. 8. Cholelithiasis. 9.  Aortic Atherosclerosis (ICD10-I70.0). Electronically Signed   By: Anner Crete M.D.   On: 07/02/2022 01:19   DG Chest Portable 1 View  Result Date: 06/21/2022 CLINICAL DATA:  Shortness of breath EXAM: PORTABLE CHEST 1 VIEW COMPARISON:  Chest x-ray 06/25/2022 FINDINGS: Right chest port catheter tip projects over the SVC, unchanged. There are patchy airspace opacities in the right mid and lower lung which have mildly increased. Small right pleural effusion persists. There central pulmonary vascular congestion and central interstitial prominence which has increased. There is no pneumothorax. The cardiomediastinal silhouette is stable. No acute fractures are seen. IMPRESSION: 1. Increasing airspace opacities in the right mid and lower lung. 2. Stable small right pleural effusion. 3. Increasing central pulmonary vascular congestion and central interstitial prominence, likely edema. Electronically Signed   By: Ronney Asters M.D.   On: 06/27/2022 23:31     Data Reviewed: Relevant notes from primary care and specialist visits, past discharge summaries as available in EHR, including Care Everywhere. Prior diagnostic testing as pertinent to current admission diagnoses Updated medications and problem lists for reconciliation ED course, including vitals, labs, imaging, treatment and response to treatment Triage notes, nursing and pharmacy notes and ED provider's notes Notable results as noted in HPI   Assessment and Plan: * Pneumonia Postobstructive pneumonia Acute on chronic respiratory failure with hypoxia Patient with increased work of breathing requiring HFNC in the ED.  Was in the 86s on room air with EMS CTA shows "Large area of consolidation involving the right middle and  right lower lobes may represent postobstructive atelectasis or infiltrate versus mass" Continue cefepime and azithromycin Antitussives, albuterol Supplemental oxygen.   Lung cancer metastatic to bone [facility]) Metastatic to bone including skull, iliac bones, thoracic vertebra and ribs S/p recent pathologic rib fracture as well as to right adrenal On palliative radiation and chemotherapy Follows with oncology        DVT prophylaxis: Lovenox  Consults: none  Advance Care Planning: DNR  Family Communication: Husband at bedside  Disposition Plan: Back to previous home environment  Severity of Illness: The appropriate patient status for this patient is INPATIENT. Inpatient status is judged to be reasonable and necessary in order to provide the required intensity of service to ensure the patient's safety. The patient's presenting symptoms, physical exam findings, and initial radiographic and laboratory data in the context of their chronic comorbidities is felt to place them at high risk for further clinical deterioration. Furthermore, it is not anticipated that the patient will be medically stable for discharge from the hospital within 2 midnights of admission.   * I certify that at the point of admission it is my clinical judgment that the patient will require inpatient hospital care spanning beyond 2 midnights from the point of admission due to high intensity of service, high risk for further deterioration and high frequency of surveillance required.*  Author: Athena Masse, MD 07/02/2022 2:42 AM  For on call review [email or web address].

## 2022-07-02 NOTE — Consult Note (Signed)
Hematology/Oncology Consult note [facility] Telephone:(336) [phone] Fax:(336) [phone]  Patient Care Team: [name], MD as PCP - General (Internal Medicine) [name], MD (Internal Medicine) Bary Castilla Forest Gleason, MD (General Surgery) Telford Nab, RN as Oncology Nurse Navigator   Name of the patient: Lorraine Mills  [id number]  [birth year]    Reason for referral- ***   Referring physician- ***  Date of visit: @TODAY @   History of presenting illness- ***  ECOG PS- ***  Pain scale- ***   Review of systems- ROS  Allergies  Allergen Reactions   Latex Rash   Levofloxacin     FLUOROQUINOLONES - unknown reaction    Patient Active Problem List   Diagnosis Date Noted   Postobstructive pneumonia 07/02/2022   Acute on chronic respiratory failure with hypoxia ([place]) 07/02/2022   Chronic kidney disease, stage 3b ([place]) 07/02/2022   AKI (acute kidney injury) ([place]) 07/02/2022   Nausea 04/01/2022   Hospital discharge follow-up 04/01/2022   Metastasis to bone ([place]) 03/24/2022   Metastasis to brain ([place]) 03/24/2022   Skull mass 03/21/2022   Pathologic rib fracture, initial encounter 03/21/2022   Lung cancer metastatic to bone [facility])    Primary cancer of right upper lobe of lung ([place]) 03/20/2022   Multiple lesions of metastatic malignancy, ribs , vertebra, skull, adrenals(HCC)    Abnormal brain MRI 03/18/2022   Mass of upper outer quadrant of left breast 05/08/2021   Chronic pain of right ankle 03/21/2021   Numbness and tingling of right lower extremity 03/21/2021   Elevated blood pressure reading in office without diagnosis of hypertension 03/21/2021   Urge incontinence of urine 11/02/2020   S/P hip replacement 11/01/2020   History of COVID-19 07/04/2020   Insomnia due to anxiety and fear 01/29/2019   Hair loss 09/30/2017   Impaired fasting glucose 09/03/2017   Personal history of colonic polyps 02/18/2016   Tubular adenoma of colon  01/31/2016   S/P hysterectomy with oophorectomy 05/27/2014   Dyslipidemia (high LDL; low HDL) 12/01/2013   Overweight (BMI 25.0-29.9) 12/01/2013   Obesity (BMI 30.0-34.9) 12/01/2013   Encounter for general adult medical examination with abnormal findings 10/07/2012   Menopause present, declines hormone replacement therapy 10/03/2011   Hypothyroidism 10/03/2011     Past Medical History:  Diagnosis Date   Anxiety    Arthritis    COPD (chronic obstructive pulmonary disease) ([place])    Depression 01/31/2016   Goiter    Hyperlipidemia    TGs   Hypothyroidism    Lung cancer ([place])    Seasonal allergies      Past Surgical History:  Procedure Laterality Date   ABDOMINAL HYSTERECTOMY     supracervical    BILATERAL OOPHORECTOMY  2001   BENIGN TUMOR   BREAST BIOPSY Left 1985   cyst    BRONCHIAL NEEDLE ASPIRATION BIOPSY  03/25/2022   Procedure: BRONCHIAL NEEDLE ASPIRATION BIOPSIES;  Surgeon: Lanier Clam, MD;  Location: [facility];  Service: Endoscopy;;   CATARACT EXTRACTION Right    COLONOSCOPY  08/17/2009   COLONOSCOPY WITH PROPOFOL N/A 03/19/2016   Procedure: COLONOSCOPY WITH PROPOFOL;  Surgeon: Robert Bellow, MD;  Location: [facility] ENDOSCOPY;  Service: Endoscopy;  Laterality: N/A;   COLONOSCOPY WITH PROPOFOL N/A 05/15/2021   Procedure: COLONOSCOPY WITH PROPOFOL;  Surgeon: Robert Bellow, MD;  Location: [facility] ENDOSCOPY;  Service: Endoscopy;  Laterality: N/A;   CORNEAL TRANSPLANT Right    ENDOBRONCHIAL ULTRASOUND N/A 03/25/2022   Procedure: ENDOBRONCHIAL ULTRASOUND;  Surgeon:  Hunsucker, Bonna Gains, MD;  Location: [facility];  Service: Endoscopy;  Laterality: N/A;   EYE SURGERY     IR IMAGING GUIDED PORT INSERTION  03/26/2022   JOINT REPLACEMENT     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   Procedure: TOTAL HIP ARTHROPLASTY ANTERIOR APPROACH;  Surgeon: Hessie Knows, MD;  Location: [facility] ORS;  Service: Orthopedics;  Laterality: Right;   VIDEO BRONCHOSCOPY  03/25/2022    Procedure: VIDEO BRONCHOSCOPY WITHOUT FLUORO;  Surgeon: Lanier Clam, MD;  Location: [facility] ENDOSCOPY;  Service: Endoscopy;;    Social History   Socioeconomic History   Marital status: Married    Spouse name: Not on file   Number of children: Not on file   Years of education: Not on file   Highest education level: Not on file  Occupational History   Occupation: retired  Tobacco Use   Smoking status: Former    Packs/day: 1.00    Years: 24.00    Total pack years: 24.00    Types: Cigarettes    Quit date: 05/09/1998    Years since quitting: 24.1   Smokeless tobacco: Never  Vaping Use   Vaping Use: Never used  Substance and Sexual Activity   Alcohol use: Not Currently    Comment: rarely    Drug use: No   Sexual activity: Not Currently  Other Topics Concern   Not on file  Social History Narrative   Not on file   Social Determinants of Health   Financial Resource Strain: Low Risk  (03/29/2021)   Overall Financial Resource Strain (CARDIA)    Difficulty of Paying Living Expenses: Not hard at all  Food Insecurity: No Food Insecurity (03/20/2022)   Hunger Vital Sign    Worried About Running Out of Food in the Last Year: Never true    [place] in the Last Year: Never true  Transportation Needs: No Transportation Needs (06/25/2022)   PRAPARE - Hydrologist (Medical): No    Lack of Transportation (Non-Medical): No  Physical Activity: Insufficiently Active (03/29/2021)   Exercise Vital Sign    Days of Exercise per Week: 3 days    Minutes of Exercise per Session: 30 min  Stress: No Stress Concern Present (03/29/2021)   [place]    Feeling of Stress : Not at all  Social Connections: Unknown (03/29/2021)   Social Connection and Isolation Panel [NHANES]    Frequency of Communication with Friends and Family: Not on file    Frequency of Social Gatherings with Friends and  Family: Not on file    Attends Religious Services: Not on file    Active Member of Clubs or Organizations: Not on file    Attends Archivist Meetings: Not on file    Marital Status: Married  Intimate Partner Violence: Not At Risk (03/20/2022)   Humiliation, Afraid, Rape, and Kick questionnaire    Fear of Current or Ex-Partner: No    Emotionally Abused: No    Physically Abused: No    Sexually Abused: No     Family History  Problem Relation Age of Onset   Cancer Mother        OVARIAN   Stroke Father    Cancer Sister        breast   Atrial fibrillation Sister    Atrial fibrillation Brother    Thyroid disease Other        MULTIPLE FAMILY MEMBERS  Current Facility-Administered Medications:    0.9 %  sodium chloride infusion, , Intravenous, Continuous, Sharen Hones, MD, Last Rate: 50 mL/hr at 07/02/22 1451, New Bag at 07/02/22 1451   acetaminophen (TYLENOL) tablet 650 mg, 650 mg, Oral, Q6H PRN **OR** acetaminophen (TYLENOL) suppository 650 mg, 650 mg, Rectal, Q6H PRN, Athena Masse, MD   albuterol (PROVENTIL) (2.5 MG/3ML) 0.083% nebulizer solution 2.5 mg, 2.5 mg, Nebulization, Q2H PRN, Athena Masse, MD   ALPRAZolam Duanne Moron) tablet 0.5 mg, 0.5 mg, Oral, BID PRN, Athena Masse, MD   enoxaparin (LOVENOX) injection 30 mg, 30 mg, Subcutaneous, Q24H, Judd Gaudier V, MD, 30 mg at 07/02/22 1003   fentaNYL (DURAGESIC) 12 MCG/HR 1 patch, 1 patch, Transdermal, Q72H, Athena Masse, MD, 1 patch at 07/02/22 0930   guaiFENesin (MUCINEX) 12 hr tablet 600 mg, 600 mg, Oral, BID, Judd Gaudier V, MD, 600 mg at 07/02/22 0523   ipratropium-albuterol (DUONEB) 0.5-2.5 (3) MG/3ML nebulizer solution 3 mL, 3 mL, Nebulization, Q6H, Athena Masse, MD, 3 mL at 07/02/22 1315   levothyroxine (SYNTHROID) tablet 75 mcg, 75 mcg, Oral, Q0600, Athena Masse, MD, 75 mcg at 07/02/22 0523   ondansetron (ZOFRAN) tablet 4 mg, 4 mg, Oral, Q6H PRN **OR** ondansetron (ZOFRAN) injection 4 mg, 4 mg,  Intravenous, Q6H PRN, Athena Masse, MD   oxyCODONE (Oxy IR/ROXICODONE) immediate release tablet 5-10 mg, 5-10 mg, Oral, Q4H PRN, Athena Masse, MD   pantoprazole (PROTONIX) EC tablet 40 mg, 40 mg, Oral, Daily, Judd Gaudier V, MD, 40 mg at 07/02/22 1001   piperacillin-tazobactam (ZOSYN) IVPB 3.375 g, 3.375 g, Intravenous, Q8H, [place], Howard F, RPH   rosuvastatin (CRESTOR) tablet 10 mg, 10 mg, Oral, Daily, Judd Gaudier V, MD, 10 mg at 07/02/22 1000  Current Outpatient Medications:    acetaminophen (TYLENOL) 500 MG tablet, Take 1,000 mg by mouth every 6 (six) hours as needed for moderate pain., Disp: , Rfl:    albuterol (PROVENTIL) (2.5 MG/3ML) 0.083% nebulizer solution, Inhale 2.5 mg into the lungs every 6 (six) hours., Disp: , Rfl:    albuterol (VENTOLIN HFA) 108 (90 Base) MCG/ACT inhaler, Inhale 2 puffs into the lungs every 6 (six) hours as needed for wheezing or shortness of breath., Disp: 8 g, Rfl: 2   ALPRAZolam (XANAX) 0.5 MG tablet, Take 1 tablet (0.5 mg total) by mouth 2 (two) times daily as needed for anxiety., Disp: 60 tablet, Rfl: 2   fenofibrate micronized (LOFIBRA) 134 MG capsule, Take 1 capsule (134 mg total) by mouth daily before breakfast., Disp: 30 capsule, Rfl: 11   fentaNYL (DURAGESIC) 12 MCG/HR, Place 1 patch onto the skin every 3 (three) days., Disp: 10 patch, Rfl: 0   levothyroxine (SYNTHROID) 75 MCG tablet, TAKE 1 TABLET EVERY DAY ON EMPTY STOMACHWITH A GLASS OF WATER AT LEAST 30-60 MINBEFORE BREAKFAST (Patient taking differently: Take 75 mcg by mouth daily before breakfast. On an empty stomach with a glass of water at least 30-60 minutes before breakfast.), Disp: 90 tablet, Rfl: 1   omeprazole (PRILOSEC) 20 MG capsule, TAKE 1 CAPSULE BY MOUTH DAILY (Patient taking differently: Take 20 mg by mouth daily as needed.), Disp: 30 capsule, Rfl: 3   ondansetron (ZOFRAN) 4 MG tablet, Take 1 tablet (4 mg total) by mouth every 6 (six) hours as needed for nausea., Disp: 30 tablet,  Rfl: 1   oxyCODONE (OXY IR/ROXICODONE) 5 MG immediate release tablet, Take 1-2 tablets (5-10 mg total) by mouth every 4 (four) hours as needed (pain).,  Disp: 60 tablet, Rfl: 0   potassium chloride (KLOR-CON M) 10 MEQ tablet, Take 1 tablet (10 mEq total) by mouth daily., Disp: 10 tablet, Rfl: 0   prednisoLONE acetate (PRED FORTE) 1 % ophthalmic suspension, Place 1 drop into the right eye daily., Disp: , Rfl:    RESTASIS 0.05 % ophthalmic emulsion, Place 1 drop into both eyes 2 (two) times daily., Disp: , Rfl:    rosuvastatin (CRESTOR) 10 MG tablet, TAKE ONE TABLET EVERY DAY. (Patient taking differently: Take 10 mg by mouth daily.), Disp: 90 tablet, Rfl: 3   mirtazapine (REMERON) 15 MG tablet, Take 1 tablet (15 mg total) by mouth at bedtime. (Patient not taking: Reported on 07/02/2022), Disp: 30 tablet, Rfl: 1   OLANZapine (ZYPREXA) 10 MG tablet, Take 1 tablet (10 mg total) by mouth at bedtime. (Patient not taking: Reported on 07/02/2022), Disp: 30 tablet, Rfl: 2   promethazine (PHENERGAN) 12.5 MG tablet, Take 1 tablet (12.5 mg total) by mouth every 8 (eight) hours as needed for nausea or vomiting. (Patient not taking: Reported on 04/09/2022), Disp: 30 tablet, Rfl: 0   Physical exam:  Vitals:   07/02/22 0942 07/02/22 1000 07/02/22 1242 07/02/22 1530  BP:  (!) 107/57 (!) 96/50 (!) 106/54  Pulse:  60 67 62  Resp:  (!) 26 17 (!) 25  Temp: 97.9 F (36.6 C)  98 F (36.7 C)   TempSrc: Oral     SpO2:  100% 98% 99%  Weight:      Height:       Physical Exam        Latest Ref Rng & Units 07/02/2022    5:05 AM  CMP  Creatinine 0.44 - 1.00 mg/dL 1.76       Latest Ref Rng & Units 07/02/2022    5:05 AM  CBC  WBC 4.0 - 10.5 K/uL 21.1   Hemoglobin 12.0 - 15.0 g/dL 8.7   Hematocrit 36.0 - 46.0 % 28.2   Platelets 150 - 400 K/uL 205     @IMAGES @  CT Angio Chest PE W and/or Wo Contrast  Result Date: 07/02/2022 CLINICAL DATA:  Concern for pulmonary embolism. Worsening shortness of breath.  History of metastatic lung cancer. EXAM: CT ANGIOGRAPHY CHEST WITH CONTRAST TECHNIQUE: Multidetector CT imaging of the chest was performed using the standard protocol during bolus administration of intravenous contrast. Multiplanar CT image reconstructions and MIPs were obtained to evaluate the vascular anatomy. RADIATION DOSE REDUCTION: This exam was performed according to the departmental dose-optimization program which includes automated exposure control, adjustment of the mA and/or kV according to patient size and/or use of iterative reconstruction technique. CONTRAST:  87mL OMNIPAQUE IOHEXOL 350 MG/ML SOLN COMPARISON:  Chest CT dated 06/17/2022. FINDINGS: Cardiovascular: There is no cardiomegaly. No significant pericardial effusion. Mild atherosclerotic calcification of the thoracic aorta. No aneurysmal dilatation or dissection. The origins of the great vessels of the aortic arch appear patent. Evaluation of the pulmonary arteries is limited due to suboptimal opacification and timing of the contrast. Apparent focal nonopacification of a branch of the pulmonary artery in the left lower lobe (70/5) likely artifactual and related to streak artifact. No large or central pulmonary artery embolus identified. Right-sided Port-A-Cath with tip close to the cavoatrial junction. Mediastinum/Nodes: Right hilar mass extending into the mediastinum and measuring approximately 6.6 x 6.1 cm and progressed since the prior CT. Mediastinal adenopathy in the prevascular space measure up to 1.3 cm also progressed since the prior CT. No mediastinal fluid collection. Lungs/Pleura: There  is high-grade narrowing of the right middle lobe bronchus due to compression by the right hilar mass. There is large area of consolidation involving the right middle and right lower lobes which may represent postobstructive atelectasis or infiltrate versus mass. Scattered bilateral upper lobe pulmonary nodularity consistent with metastatic disease.  There is diffuse interstitial and interlobular septal thickening in the right upper lobe which may represent edema. Lymphangitic spread of tumor is not excluded. Small bilateral pleural effusions. No pneumothorax. The central airways remain patent. Upper Abdomen: Indeterminate 13 mm hypoenhancing lesion in the right lobe of the liver (95/5). There is layering sludge and small stones. Musculoskeletal: Osteopenia with degenerative changes of the spine. Multiple in osseous lucent lesions consistent with metastatic disease. Review of the MIP images confirms the above findings. IMPRESSION: 1. No large or central pulmonary artery embolus identified. 2. Interval progression of the right hilar mass and mediastinal adenopathy. 3. Large area of consolidation involving the right middle and right lower lobes may represent postobstructive atelectasis or infiltrate versus mass. 4. Scattered bilateral upper lobe pulmonary nodularity consistent with metastatic disease. 5. Small bilateral pleural effusions. 6. Osseous metastatic disease. 7. Indeterminate 13 mm hypoenhancing lesion in the right lobe of the liver. 8. Cholelithiasis. 9.  Aortic Atherosclerosis (ICD10-I70.0). Electronically Signed   By: Anner Crete M.D.   On: 07/02/2022 01:19   DG Chest Portable 1 View  Result Date: 06/24/2022 CLINICAL DATA:  Shortness of breath EXAM: PORTABLE CHEST 1 VIEW COMPARISON:  Chest x-ray 06/25/2022 FINDINGS: Right chest port catheter tip projects over the SVC, unchanged. There are patchy airspace opacities in the right mid and lower lung which have mildly increased. Small right pleural effusion persists. There central pulmonary vascular congestion and central interstitial prominence which has increased. There is no pneumothorax. The cardiomediastinal silhouette is stable. No acute fractures are seen. IMPRESSION: 1. Increasing airspace opacities in the right mid and lower lung. 2. Stable small right pleural effusion. 3. Increasing  central pulmonary vascular congestion and central interstitial prominence, likely edema. Electronically Signed   By: Ronney Asters M.D.   On: 07/07/2022 23:31   DG Chest 2 View  Result Date: 06/25/2022 CLINICAL DATA:  Shortness of breath. History of metastatic lung cancer on chemotherapy. EXAM: CHEST - 2 VIEW COMPARISON:  Chest two views 04/25/2022, CT chest 06/17/2022 FINDINGS: Right chest wall porta catheter tip overlies the central superior vena cava. Cardiac silhouette and mediastinal contours are unchanged. Note is made of a right hilar mass better seen on recent 06/17/2021 CT. Moderate calcification within aortic arch. Moderate elevation of the right hemidiaphragm. Small right pleural effusion and inferolateral pleural thickening is increased from 04/25/2022 radiograph and consistent with findings on recent 06/17/2022 CT. Mild bilateral mid and lower lung interstitial thickening. No pneumothorax. Mild-to-moderate multilevel degenerative disc changes of the thoracic spine. IMPRESSION: 1. Mild bilateral mid and lower lung interstitial thickening, possible mild interstitial pulmonary edema. 2. Small right pleural effusion and inferolateral pleural thickening, increased from 04/25/2022 radiograph and consistent with findings on recent 06/17/2022 CT. 3. Note is made of a right hilar mass better seen on recent 06/17/2021 CT. Electronically Signed   By: Yvonne Kendall M.D.   On: 06/25/2022 12:57   CT CHEST ABDOMEN PELVIS W CONTRAST  Result Date: 06/17/2022 CLINICAL DATA:  Metastatic lung cancer. Current chemotherapy patient. Shortness of breath. * Tracking Code: BO * EXAM: CT CHEST, ABDOMEN, AND PELVIS WITH CONTRAST TECHNIQUE: Multidetector CT imaging of the chest, abdomen and pelvis was performed following the standard protocol  during bolus administration of intravenous contrast. RADIATION DOSE REDUCTION: This exam was performed according to the departmental dose-optimization program which includes automated  exposure control, adjustment of the mA and/or kV according to patient size and/or use of iterative reconstruction technique. CONTRAST:  162mL OMNIPAQUE IOHEXOL 300 MG/ML  SOLN COMPARISON:  03/20/2022 FINDINGS: CT CHEST FINDINGS Cardiovascular: Right Port-A-Cath tip: Lower SVC. Progressive right hilar and mediastinal mass causes worsened narrowing of the right upper lobe and right lower lobe pulmonary arteries (image 22 and 26 of series 3, respectively) and increased encroachment and extrinsic compression of the lower SVC (image 24, series 3). Aortic arch atherosclerotic vascular disease.  Mild cardiomegaly. Mediastinum/Nodes: As noted above, there is progressive confluent right hilar and mediastinal adenopathy contributing to narrowing of the trachea at the carina (image 19, series 3) and narrowing of the bronchus intermedius (image 24, series 3). Tracheomalacia may also be contributing to the tracheal narrowing given the somewhat slit-like cross-sectional contour of the lower trachea. Overall central right tumor measures about 5.1 by 5.7 cm on image 24 series 3, previously this was made up of individual nodes measuring up to about 2.3 cm individually. A right lower paratracheal node measuring 1.9 cm in short axis on image 19 series 3 previously measured about 2.0 cm. A right internal mammary lymph node currently measures 1.9 cm in short axis on image 29 series 3 (formerly 1.1 cm) and invades the third intercostal space. Lungs/Pleura: Small right pleural effusion with possible tethering of the visceral and parietal pleura on the right on image 27 of series 3. Trace left pleural effusion. Marked airway narrowing and plugging in the right upper lobe with secondary pulmonary lobular septal thickening and ground-glass opacities throughout much of the right upper lobe. Substantial atelectasis in the right middle lobe and right lower lobe with marked airway narrowing. On the left side there is a somewhat lesser degree of  secondary pulmonary lobular septal thickening but with scattered ground-glass pulmonary nodularity and scattered additional potential metastatic nodularity. This includes a new 1.2 by 1.0 cm nodule anteriorly in the left upper lobe on image 51 series 4 and scattered smaller nodules along the left pleural surfaces, in the left upper lobe, and left lower lobe. Musculoskeletal: Progressive lytic destruction of the right fourth rib, with a new soft tissue mass component measuring 3.0 by 1.6 cm in the right chest wall on image 24 series 3. Probably pathologic fracture of the right eleventh rib posteriorly on image 48 of series 3 is new and associated with an abnormal rib lucency which is likewise new. Small lytic lesion of the left posterior sternal manubrium adjacent to the sternoclavicular joint. Lytic lesion in the right distal clavicle. Lucent lesions at T7, T9, and T11 in the thoracic spine compatible with new thoracic spine metastatic lesions. There is also some left posterior vertebral body and pedicle lytic involvement at the T7 vertebra. Right lower axillary lymph node or posterior breast nodule is enhancing and measures 1.3 cm in diameter on image 39 of series 3, new compared to the prior exam likely representing malignancy. A new small enhancing nodule in the left lower breast measures 0.7 cm in diameter on image 41 series 3. Subcutaneous nodule posterior to the left latissimus dorsi muscle measuring 1.2 by 0.8 cm on image 30 series 3, new compared to the prior exam and suspicious for subcutaneous metastatic lesion. CT ABDOMEN PELVIS FINDINGS Hepatobiliary: Unremarkable Pancreas: Unremarkable Spleen: There are 2-3 new small hypodense lesions in the spleen which are technically  nonspecific but given the clinical context raise some concern for potential early metastatic disease. A 0.7 cm lesion in the lower spleen on image 52 of series 3 represents an index lesion. Adrenals/Urinary Tract: Right adrenal mass 3.3  by 1.6 cm on image 47 series 3, formerly 3.7 by 2.0 cm. Appearance suspicious for malignancy but mildly improved from previous. Kidneys unremarkable. Stomach/Bowel: Sigmoid colon diverticulosis. Vascular/Lymphatic: Atherosclerosis is present, including aortoiliac atherosclerotic disease. Reproductive: Partially obscured by streak artifact from the right hip implant. Suspected hysterectomy. Other: New 0.8 by 0.5 cm soft tissue nodule along the lower omentum, image 98 of series 3. New right lower quadrant omental nodule, 0.9 by 0.8 cm on image 88 series 3. Musculoskeletal: Lytic metastatic lesions particularly at L1, L4, L5, in the right iliac bone (image 130, series 5), and in the left iliac bone (image 95, series 5). These are new and/or substantially enlarged compared to previous. New soft tissue mass in the left gluteus maximus muscle, 2.4 by 1.6 cm on image 98 series 3, compatible with metastatic lesion. 0.8 cm nodule along the superficial fascia margin of the gluteal musculature on the right on image 93 series 3. Multifocal rim enhancing nodularity in the lumbar paraspinal musculature, including a 1.1 cm in diameter lesion in the left paraspinal musculature on image 61 series 3, compatible with additional intramuscular metastatic lesions. IMPRESSION: 1. Significant progression of malignancy, with enlargement of the right hilar and mediastinal mass, progressive confluent right hilar and mediastinal adenopathy, new bilateral pulmonary nodules, new bilateral pleural effusions, new subcutaneous and intramuscular metastatic lesions, and new and/or enlarging lytic osseous metastatic lesions. 2. There is also increased narrowing of the trachea at the carina and narrowing of the bronchus intermedius due to extrinsic compression from the right hilar and mediastinal mass. There is also considerable airway narrowing and plugging in the right upper lobe. 3. New small hypodense lesions in the spleen are technically  nonspecific but raise the possibility of early metastatic disease. 4. Reduced size of the right adrenal mass, currently 3.3 by 1.6 cm, previously 3.7 by 2.0 cm. 5. New small right and trace left pleural effusions. The right pleural effusion may be partially loculated and there is some tethering of the visceral and parietal pleura on the right. Trace left pleural effusion. 6. Progressive lytic involvement of the right fourth rib and right eleventh rib. 7. New small soft tissue nodules in the subcutaneous tissues posterior to the left latissimus dorsi muscle and in the right gluteal musculature compatible with subcutaneous metastatic lesions. New small enhancing inferior breast nodules, likely metastatic. 8. Aortic atherosclerosis. Aortic Atherosclerosis (ICD10-I70.0). Electronically Signed   By: Van Clines M.D.   On: 06/17/2022 19:45   DG Sniff Test  Result Date: 06/04/2022 CLINICAL DATA:  Shortness of breath, right lung cancer EXAM: CHEST FLUOROSCOPY TECHNIQUE: Real-time fluoroscopic evaluation of the chest was performed. FLUOROSCOPY: Radiation Exposure Index (as provided by the fluoroscopic device): 2.9 mGy COMPARISON:  CT chest 03/20/2022 FINDINGS: Real-time fluoroscopy of the diaphragms was performed during inspiration, expiration and sniffing. Elevation of the right diaphragm. Right basilar atelectasis. No significant diaphragmatic motion during inspiration, expiration or sniffing. Normal left diaphragmatic motion during inspiration, expiration and sniffing. IMPRESSION: 1. No significant right diaphragmatic motion during inspiration, expiration or sniffing consistent with right diaphragmatic paralysis. Electronically Signed   By: Kathreen Devoid M.D.   On: 06/04/2022 12:38    Assessment and plan- Patient is a 72 y.o. female ***   Thank you for this kind referral and  the opportunity to participate in the care of this  Patient   Visit Diagnosis 1. SOB (shortness of breath)   2. HCAP  (healthcare-associated pneumonia)     Dr. Randa Evens, MD, MPH [facility] [phone] 07/02/2022

## 2022-07-02 NOTE — IPAL (Signed)
  Interdisciplinary Goals of Care Family Meeting   Date carried out: 07/02/2022  Location of the meeting: Bedside  Member's involved: Physician, Bedside Registered Nurse, and Family Member or next of kin, spouse Toshia Larkin Power of Attorney or Loss adjuster, chartered: Patient  Discussion: We discussed goals of care for MYYA MEENACH .   I have reviewed medical records including EPIC notes, labs and imaging, assessed the patient and then met with patient and her husband to discuss major active diagnoses, plan of care, natural trajectory, prognosis, GOC, EOL wishes, disposition and options including Full code/DNI/DNR and the concept of comfort care if DNR is elected. Questions and concerns were addressed. They are  in agreement to continue current plan of care .  Patient and husband stated that they spoke with their children after speaking with palliative care on 1/19 and patient elects to be DNR but would like to continue current treatment.  She would want BiPAP.  If she is not responding to current treatment when she would like a further discussion to decide whether she is agreeable to comfort care.   Code status:   Code Status: DNR   Disposition: Continue current acute care  Time spent for the meeting: [place], MD  07/02/2022, 5:47 AM

## 2022-07-02 NOTE — ED Notes (Signed)
Pt became nauseous after repositioning. IV zofran administered

## 2022-07-02 NOTE — Progress Notes (Signed)
PHARMACIST - PHYSICIAN COMMUNICATION  CONCERNING:  Enoxaparin (Lovenox) for DVT Prophylaxis    RECOMMENDATION: Patient was prescribed enoxaprin 40mg  q24 hours for VTE prophylaxis.   Filed Weights   06/20/2022 2300  Weight: 67.1 kg (148 lb)    Body mass index is 27.96 kg/m.  Estimated Creatinine Clearance: 26.9 mL/min (A) (by C-G formula based on SCr of 1.68 mg/dL (H)).  Patient is candidate for enoxaparin 30mg  every 24 hours based on CrCl <23ml/min or Weight <45kg  DESCRIPTION: Pharmacy has adjusted enoxaparin dose per [facility] policy.  Patient is now receiving enoxaparin 30 mg every 24 hours   Renda Rolls, PharmD, [facility] 07/02/2022 3:55 AM

## 2022-07-02 NOTE — ED Notes (Signed)
Pt's husband provided with recliner for the night. Pt given bottled water per request. States no other needs at this time.

## 2022-07-02 NOTE — ED Provider Notes (Incomplete)
**Note Lorraine-Identified via Obfuscation** [facility] Provider Note    Event Date/Time   First MD Initiated Contact with Patient 06/30/2022 2304     (approximate)   History   Shortness of Breath   HPI  Lorraine Mills is a 72 y.o. female with history of COPD, lung cancer currently getting chemotherapy who presents to the emergency department with increasing shortness of breath.  No chest pain.  No fever or productive cough.  Wears oxygen at home.  Symptoms worse with lying flat.   History provided by patient, husband.    Past Medical History:  Diagnosis Date  . Anxiety   . Arthritis   . COPD (chronic obstructive pulmonary disease) ([place])   . Depression 01/31/2016  . Goiter   . Hyperlipidemia    TGs  . Hypothyroidism   . Lung cancer ([place])   . Seasonal allergies     Past Surgical History:  Procedure Laterality Date  . ABDOMINAL HYSTERECTOMY     supracervical   . BILATERAL OOPHORECTOMY  2001   BENIGN TUMOR  . BREAST BIOPSY Left 1985   cyst   . BRONCHIAL NEEDLE ASPIRATION BIOPSY  03/25/2022   Procedure: BRONCHIAL NEEDLE ASPIRATION BIOPSIES;  Surgeon: Lanier Clam, MD;  Location: [facility] ENDOSCOPY;  Service: Endoscopy;;  . CATARACT EXTRACTION Right   . COLONOSCOPY  08/17/2009  . COLONOSCOPY WITH PROPOFOL N/A 03/19/2016   Procedure: COLONOSCOPY WITH PROPOFOL;  Surgeon: Robert Bellow, MD;  Location[facility]COPY;  Service: Endoscopy;  Laterality: N/A;  . COLONOSCOPY WITH PROPOFOL N/A 05/15/2021   Procedure: COLONOSCOPY WITH PROPOFOL;  Surgeon: Robert Bellow, MD;  Location: [facility] ENDOSCOPY;  Service: Endoscopy;  Laterality: N/A;  . CORNEAL TRANSPLANT Right   . ENDOBRONCHIAL ULTRASOUND N/A 03/25/2022   Procedure: ENDOBRONCHIAL ULTRASOUND;  Surgeon: Lanier Clam, MD;  Location: [facility] ENDOSCOPY;  Service: Endoscopy;  Laterality: N/A;  . EYE SURGERY    . IR IMAGING GUIDED PORT INSERTION  03/26/2022  . JOINT REPLACEMENT    . 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    Procedure: TOTAL HIP ARTHROPLASTY ANTERIOR APPROACH;  Surgeon: Hessie Knows, MD;  Location: [facility] ORS;  Service: Orthopedics;  Laterality: Right;  Marland Kitchen VIDEO BRONCHOSCOPY  03/25/2022   Procedure: VIDEO BRONCHOSCOPY WITHOUT FLUORO;  Surgeon: Lanier Clam, MD;  Location: [facility];  Service: Endoscopy;;    MEDICATIONS:  Prior to Admission medications   Medication Sig Start Date End Date Taking? Authorizing Provider  acetaminophen (TYLENOL) 500 MG tablet Take 1,000 mg by mouth every 6 (six) hours as needed for moderate pain.    [provider]  albuterol (PROVENTIL) (2.5 MG/3ML) 0.083% nebulizer solution Inhale into the lungs. 06/05/22 06/05/23  [provider]  albuterol (VENTOLIN HFA) 108 (90 Base) MCG/ACT inhaler Inhale 2 puffs into the lungs every 6 (six) hours as needed for wheezing or shortness of breath. 05/07/22   Sindy Guadeloupe, MD  ALPRAZolam Duanne Moron) 0.5 MG tablet Take 1 tablet (0.5 mg total) by mouth 2 (two) times daily as needed for anxiety. 04/01/22   [name], MD  fenofibrate micronized (LOFIBRA) 134 MG capsule Take 1 capsule (134 mg total) by mouth daily before breakfast. 02/06/22 02/01/23  [name], MD  fentaNYL (DURAGESIC) 12 MCG/HR Place 1 patch onto the skin every 3 (three) days. 06/21/22   Jane Canary, MD  levothyroxine (SYNTHROID) 75 MCG tablet TAKE 1 TABLET EVERY DAY ON EMPTY STOMACHWITH A GLASS OF WATER AT LEAST 30-60 [place] BREAKFAST Patient taking differently: Take 75  mcg by mouth daily before breakfast. On an empty stomach with a glass of water at least 30-60 minutes before breakfast. 12/22/21   Dutch Quint B, FNP  mirtazapine (REMERON) 15 MG tablet Take 1 tablet (15 mg total) by mouth at bedtime. 05/29/22   Sindy Guadeloupe, MD  OLANZapine (ZYPREXA) 10 MG tablet Take 1 tablet (10 mg total) by mouth at bedtime. 05/07/22   Sindy Guadeloupe, MD  omeprazole (PRILOSEC) 20 MG capsule TAKE 1 CAPSULE BY MOUTH DAILY Patient taking  differently: Take 20 mg by mouth daily. 01/28/21   [name], MD  ondansetron (ZOFRAN) 4 MG tablet Take 1 tablet (4 mg total) by mouth every 6 (six) hours as needed for nausea. 03/27/22   Danford, Suann Larry, MD  oxyCODONE (OXY IR/ROXICODONE) 5 MG immediate release tablet Take 1-2 tablets (5-10 mg total) by mouth every 4 (four) hours as needed (pain). 06/25/22   Borders, Kirt Boys, NP  potassium chloride (KLOR-CON M) 10 MEQ tablet Take 1 tablet (10 mEq total) by mouth daily. 06/27/22   Borders, Kirt Boys, NP  prednisoLONE acetate (PRED FORTE) 1 % ophthalmic suspension Place 1 drop into the right eye daily. 07/03/20   [provider]  promethazine (PHENERGAN) 12.5 MG tablet Take 1 tablet (12.5 mg total) by mouth every 8 (eight) hours as needed for nausea or vomiting. Patient not taking: Reported on 04/09/2022 04/01/22   [name], MD  RESTASIS 0.05 % ophthalmic emulsion Place 1 drop into both eyes 2 (two) times daily. 09/30/21   [provider]  rosuvastatin (CRESTOR) 10 MG tablet TAKE ONE TABLET EVERY DAY. Patient taking differently: Take 10 mg by mouth daily. 03/06/22   [name], MD    Physical Exam   Triage Vital Signs: ED Triage Vitals  Enc Vitals Group     BP 07/05/2022 2302 113/61     Pulse Rate 06/09/2022 2302 (!) 120     Resp 06/25/2022 2302 18     Temp 06/14/2022 2302 98.4 F (36.9 C)     Temp Source 06/19/2022 2302 Oral     SpO2 06/19/2022 2302 95 %     Weight 06/16/2022 2300 148 lb (67.1 kg)     Height 06/09/2022 2300 5\' 1"  (1.549 m)     Head Circumference --      Peak Flow --      Pain Score 06/24/2022 2300 0     Pain Loc --      Pain Edu? --      Excl. in [place]? --     Most recent vital signs: Vitals:   07/02/2022 2302  BP: 113/61  Pulse: (!) 120  Resp: 18  Temp: 98.4 F (36.9 C)  SpO2: 95%    CONSTITUTIONAL: Alert, responds appropriately to questions.  Chronically ill-appearing HEAD: Normocephalic, atraumatic EYES: Conjunctivae clear, pupils appear  equal, sclera nonicteric ENT: normal nose; moist mucous membranes NECK: Supple, normal ROM CARD: Regular and tachycardic; S1 and S2 appreciated RESP: Patient is tachypneic, speaking in truncated sentences, no hypoxia on nasal cannula, no rhonchi or rales, no wheezing ABD/GI: Non-distended; soft, non-tender, no rebound, no guarding, no peritoneal signs BACK: The back appears normal EXT: Normal ROM in all joints; no deformity noted, no edema, no calf tenderness or calf swelling SKIN: Normal color for age and race; warm; no rash on exposed skin NEURO: Moves all extremities equally, normal speech PSYCH: The patient's mood and manner are appropriate.   ED Results / Procedures /  Treatments   LABS: (all labs ordered are listed, but only abnormal results are displayed) Labs Reviewed  CBC WITH DIFFERENTIAL/PLATELET - Abnormal; Notable for the following components:      Result Value   WBC 21.2 (*)    RBC 3.16 (*)    Hemoglobin 9.0 (*)    HCT 29.8 (*)    RDW 16.4 (*)    Neutro Abs 18.0 (*)    Monocytes Absolute 1.8 (*)    Abs Immature Granulocytes 0.28 (*)    All other components within normal limits  CULTURE, BLOOD (ROUTINE X 2)  CULTURE, BLOOD (ROUTINE X 2)  RESP PANEL BY RT-PCR (RSV, FLU A&B, COVID)  RVPGX2  LACTIC ACID, PLASMA  LACTIC ACID, PLASMA  COMPREHENSIVE METABOLIC PANEL  URINALYSIS, ROUTINE W REFLEX MICROSCOPIC  BRAIN NATRIURETIC PEPTIDE  BLOOD GAS, VENOUS  MAGNESIUM  TROPONIN I (HIGH SENSITIVITY)     EKG:  EKG Interpretation  Date/Time:    Ventricular Rate:    PR Interval:    QRS Duration:   QT Interval:    QTC Calculation:   R Axis:     Text Interpretation:           RADIOLOGY: My personal review and interpretation of imaging:  ***  I have personally reviewed all radiology reports.   DG Chest Portable 1 View  Result Date: 06/25/2022 CLINICAL DATA:  Shortness of breath EXAM: PORTABLE CHEST 1 VIEW COMPARISON:  Chest x-ray 06/25/2022 FINDINGS: Right  chest port catheter tip projects over the SVC, unchanged. There are patchy airspace opacities in the right mid and lower lung which have mildly increased. Small right pleural effusion persists. There central pulmonary vascular congestion and central interstitial prominence which has increased. There is no pneumothorax. The cardiomediastinal silhouette is stable. No acute fractures are seen. IMPRESSION: 1. Increasing airspace opacities in the right mid and lower lung. 2. Stable small right pleural effusion. 3. Increasing central pulmonary vascular congestion and central interstitial prominence, likely edema. Electronically Signed   By: Ronney Asters M.D.   On: 07/09/2022 23:31     PROCEDURES:  Critical Care performed: Yes, see critical care procedure note(s)   CRITICAL CARE Performed by: Cyril Mourning Tuere Nwosu   Total critical care time: 40 minutes  Critical care time was exclusive of separately billable procedures and treating other patients.  Critical care was necessary to treat or prevent imminent or life-threatening deterioration.  Critical care was time spent personally by me on the following activities: development of treatment plan with patient and/or surrogate as well as nursing, discussions with consultants, evaluation of patient's response to treatment, examination of patient, obtaining history from patient or surrogate, ordering and performing treatments and interventions, ordering and review of laboratory studies, ordering and review of radiographic studies, pulse oximetry and re-evaluation of patient's condition.   Marland Kitchen1-3 Lead EKG Interpretation  Performed by: Terry Bolotin, Delice Bison, DO Authorized by: Levy Wellman, Delice Bison, DO     Interpretation: abnormal     ECG rate:  120   ECG rate assessment: tachycardic     Rhythm: sinus tachycardia     Ectopy: PVCs     Conduction: normal       IMPRESSION / MDM / ASSESSMENT AND PLAN / ED COURSE  I reviewed the triage vital signs and the nursing  notes.    Patient here with shortness of breath.  The patient is on the cardiac monitor to evaluate for evidence of arrhythmia and/or significant heart rate changes.   DIFFERENTIAL DIAGNOSIS (includes but not  limited to):   Worsening malignancy, PE, pneumonia, CHF, pneumothorax, pleural effusion   Patient's presentation is most consistent with acute presentation with potential threat to life or bodily function.   PLAN: Will obtain CBC, BMP, troponin, BNP, COVID and flu swabs, CTA chest.  Will give albuterol, Atrovent for symptomatic relief.  Due to increased work of breathing, will place on high flow nasal cannula.  Respiratory unable to obtain ABG despite multiple attempts.  Will obtain VBG.  Anticipate admission.   MEDICATIONS GIVEN IN ED: Medications  albuterol (PROVENTIL) (2.5 MG/3ML) 0.083% nebulizer solution 5 mg (has no administration in time range)  ipratropium (ATROVENT) nebulizer solution 0.5 mg (has no administration in time range)     ED COURSE:  ***   CONSULTS:  ***   OUTSIDE RECORDS REVIEWED: Reviewed last pulmonology note on 06/05/2022.       FINAL CLINICAL IMPRESSION(S) / ED DIAGNOSES   Final diagnoses:  None     Rx / DC Orders   ED Discharge Orders     None        Note:  This document was prepared using Dragon voice recognition software and may include unintentional dictation errors.

## 2022-07-03 ENCOUNTER — Other Ambulatory Visit: Payer: PPO

## 2022-07-03 ENCOUNTER — Ambulatory Visit: Payer: PPO

## 2022-07-03 DIAGNOSIS — C3490 Malignant neoplasm of unspecified part of unspecified bronchus or lung: Secondary | ICD-10-CM | POA: Diagnosis not present

## 2022-07-03 DIAGNOSIS — C799 Secondary malignant neoplasm of unspecified site: Secondary | ICD-10-CM | POA: Diagnosis not present

## 2022-07-03 DIAGNOSIS — Z515 Encounter for palliative care: Secondary | ICD-10-CM | POA: Diagnosis not present

## 2022-07-03 DIAGNOSIS — J189 Pneumonia, unspecified organism: Secondary | ICD-10-CM | POA: Diagnosis not present

## 2022-07-03 DIAGNOSIS — J9621 Acute and chronic respiratory failure with hypoxia: Secondary | ICD-10-CM | POA: Diagnosis not present

## 2022-07-03 LAB — LEGIONELLA PNEUMOPHILA SEROGP 1 UR AG: L. pneumophila Serogp 1 Ur Ag: NEGATIVE

## 2022-07-03 LAB — CBC
HCT: 25.2 % — ABNORMAL LOW (ref 36.0–46.0)
Hemoglobin: 7.5 g/dL — ABNORMAL LOW (ref 12.0–15.0)
MCH: 28.6 pg (ref 26.0–34.0)
MCHC: 29.8 g/dL — ABNORMAL LOW (ref 30.0–36.0)
MCV: 96.2 fL (ref 80.0–100.0)
Platelets: 198 10*3/uL (ref 150–400)
RBC: 2.62 MIL/uL — ABNORMAL LOW (ref 3.87–5.11)
RDW: 16.9 % — ABNORMAL HIGH (ref 11.5–15.5)
WBC: 17.7 10*3/uL — ABNORMAL HIGH (ref 4.0–10.5)
nRBC: 0 % (ref 0.0–0.2)

## 2022-07-03 LAB — BASIC METABOLIC PANEL
Anion gap: 9 (ref 5–15)
BUN: 18 mg/dL (ref 8–23)
CO2: 28 mmol/L (ref 22–32)
Calcium: 7.6 mg/dL — ABNORMAL LOW (ref 8.9–10.3)
Chloride: 103 mmol/L (ref 98–111)
Creatinine, Ser: 1.47 mg/dL — ABNORMAL HIGH (ref 0.44–1.00)
GFR, Estimated: 38 mL/min — ABNORMAL LOW (ref >60)
Glucose, Bld: 127 mg/dL — ABNORMAL HIGH (ref 70–99)
Potassium: 3.6 mmol/L (ref 3.5–5.1)
Sodium: 140 mmol/L (ref 135–145)

## 2022-07-03 LAB — MAGNESIUM: Magnesium: 2 mg/dL (ref 1.7–2.4)

## 2022-07-03 LAB — VITAMIN B12: Vitamin B-12: 4980 pg/mL — ABNORMAL HIGH (ref 180–914)

## 2022-07-03 MED ORDER — GLYCOPYRROLATE 1 MG PO TABS: 1.0000 mg | ORAL_TABLET | ORAL | Status: DC | PRN

## 2022-07-03 MED ORDER — LORAZEPAM 2 MG/ML IJ SOLN: 0.5000 mg | INTRAMUSCULAR | Status: DC | PRN

## 2022-07-03 MED ORDER — CYCLOSPORINE 0.05 % OP EMUL
1.0000 [drp] | Freq: Two times a day (BID) | OPHTHALMIC | Status: DC
  Filled 2022-07-03: qty 30

## 2022-07-03 MED ORDER — MORPHINE SULFATE (CONCENTRATE) 10 MG/0.5ML PO SOLN: 5.0000 mg | ORAL | Status: DC | PRN

## 2022-07-03 MED ORDER — PREDNISOLONE ACETATE 1 % OP SUSP: 1.0000 [drp] | Freq: Every day | OPHTHALMIC | Status: DC

## 2022-07-03 MED ORDER — MORPHINE SULFATE (PF) 2 MG/ML IV SOLN
1.0000 mg | INTRAVENOUS | Status: DC | PRN
  Administered 2022-07-03 (×3): 2 mg via INTRAVENOUS
  Filled 2022-07-03 (×3): qty 1

## 2022-07-03 MED ORDER — GLYCOPYRROLATE 0.2 MG/ML IJ SOLN: 0.2000 mg | INTRAMUSCULAR | Status: DC | PRN

## 2022-07-03 MED ORDER — GLYCOPYRROLATE 0.2 MG/ML IJ SOLN
0.2000 mg | INTRAMUSCULAR | Status: DC | PRN
  Administered 2022-07-03 (×2): 0.2 mg via INTRAVENOUS
  Filled 2022-07-03 (×3): qty 1

## 2022-07-03 NOTE — Progress Notes (Signed)
  Progress Note   Patient: Lorraine Mills IYM:[id number] DOB: 05/31/1951 DOA: 06/22/2022     1 DOS: the patient was seen and examined on 07/03/2022   Brief hospital course: BUSHRA DENMAN is a 72 y.o. 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  Patient came to the hospital with worsening shortness of breath, she developed severe hypoxemia, was placed on 10 L oxygen. CTscan showed right hilar mass, with large consolidation in the right lower lobe and right middle lobe, this is consistent with postobstructive pneumonia.  Patient was started on cefepime. Seen by oncology and apparently care, recommended hospice care.  Patient condition worsened on 1/25, transition to comfort care.  Assessment and Plan:  Postobstructive pneumonia Acute on chronic respiratory failure with hypoxia Lung cancer metastatic to bone [facility]) Metastatic to bone including skull, iliac bones, thoracic vertebra and ribs S/p recent pathologic rib fracture as well as to right adrenal Discussed with oncology and pulmonology, patient condition has deteriorated, cancer has grown.  No additional treatment available.  Patient has been seen by palliative care yesterday, changed to DO NOT RESUSCITATE status.  Patient condition further deteriorated today, she has significant respite distress. After discussion with patient and husband, decision was made to transition to comfort care.  Will continue comfort care measures in the hospital, if he survives tonight, will consider transfer to inpatient hospice facility.   Acute kidney injury. No need for additional treatment.   Anemia of chronic disease.       Subjective:  Patient developed worsening short of breath and hypoxemia.  Minimally  responsive.  Physical Exam: Vitals:   07/03/22 1030 07/03/22 1100 07/03/22 1130 07/03/22 1311  BP: 92/65 96/60 (!) 107/55 (!) 88/45  Pulse: 83 63 68 81  Resp: 13 (!) 21 17 13   Temp:      TempSrc:      SpO2: 95% 94% 92% 100%  Weight:      Height:       General exam: Appears ill,  Respiratory system: Some labored breathing with irregular respiratory rate,.  Cardiovascular system: S1 & S2 heard, RRR. No JVD, murmurs, rubs, gallops or clicks. No pedal edema. Gastrointestinal system: Abdomen is nondistended, soft and nontender. No organomegaly or masses felt. Normal bowel sounds heard. Central nervous system: Drowsy and confused. Extremities: Symmetric 5 x 5 power. Skin: No rashes, lesions or ulcers .   Data Reviewed:  Reviewed lab results.  Family Communication: Husband updated.  Disposition: Status is: Inpatient Remains inpatient appropriate because: Comfort care.  Planned Discharge Destination:  Inpatient hospice transfer if survives tonight.    Time spent: 50 minutes  Author: Sharen Hones, MD 07/03/2022 2:16 PM  For on call review [email or web address].

## 2022-07-03 NOTE — ED Notes (Signed)
Secretary called for transport

## 2022-07-03 NOTE — Progress Notes (Signed)
[place] at [facility] [phone]) [phone] [phone]) [phone]   Name: Lorraine Mills Date: 07/03/2022 MRN: [id number]  DOB: 1950-06-18  Patient Care Team: [name], MD as PCP - General (Internal Medicine) [name], MD (Internal Medicine) Bary Castilla, Forest Gleason, MD (General Surgery) Telford Nab, RN as Oncology Nurse Navigator    REASON FOR CONSULTATION: Lorraine Mills is a 72 y.o. 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 Recent CT chest, abdomen, pelvis showed significant disease progression after 3 cycles of carbo Alimta Keytruda chemotherapy. There was worsening lung disease with mediastinal adenopathy encroachment of SVC. Patient was referred to radiation oncology. Plan was to initiate weekly CarboTaxol chemotherapy following completion of radiation.  However, patient has been declining over the past several weeks with declining performance status, shortness of breath, poor oral intake.  She was admitted to hospital on 06/25/2022 with CT suggestive of postobstructive pneumonia.  Palliative care was consulted to address goals. .    CODE STATUS: DNR  PAST MEDICAL HISTORY: Past Medical History:  Diagnosis Date   Anxiety    Arthritis    COPD (chronic obstructive pulmonary disease) ([place])    Depression 01/31/2016   Goiter    Hyperlipidemia    TGs   Hypothyroidism    Lung cancer ([place])    Seasonal allergies     PAST SURGICAL HISTORY:  Past Surgical History:  Procedure Laterality Date   ABDOMINAL HYSTERECTOMY     supracervical    BILATERAL OOPHORECTOMY  2001   BENIGN TUMOR   BREAST BIOPSY Left 1985   cyst    BRONCHIAL NEEDLE ASPIRATION BIOPSY  03/25/2022   Procedure: BRONCHIAL NEEDLE ASPIRATION BIOPSIES;  Surgeon: Lanier Clam, MD;  Location: [facility];  Service: Endoscopy;;   CATARACT EXTRACTION  Right    COLONOSCOPY  08/17/2009   COLONOSCOPY WITH PROPOFOL N/A 03/19/2016   Procedure: COLONOSCOPY WITH PROPOFOL;  Surgeon: Robert Bellow, MD;  Location: [facility];  Service: Endoscopy;  Laterality: N/A;   COLONOSCOPY WITH PROPOFOL N/A 05/15/2021   Procedure: COLONOSCOPY WITH PROPOFOL;  Surgeon: Robert Bellow, MD;  Location: [facility];  Service: Endoscopy;  Laterality: N/A;   CORNEAL TRANSPLANT Right    ENDOBRONCHIAL ULTRASOUND N/A 03/25/2022   Procedure: ENDOBRONCHIAL ULTRASOUND;  Surgeon: Lanier Clam, MD;  Location: [facility];  Service: Endoscopy;  Laterality: N/A;   EYE SURGERY     IR IMAGING GUIDED PORT INSERTION  03/26/2022   JOINT REPLACEMENT     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   Procedure: TOTAL HIP ARTHROPLASTY ANTERIOR APPROACH;  Surgeon: Hessie Knows, MD;  Location: [facility];  Service: Orthopedics;  Laterality: Right;   VIDEO BRONCHOSCOPY  03/25/2022   Procedure: VIDEO BRONCHOSCOPY WITHOUT FLUORO;  Surgeon: Lanier Clam, MD;  Location: [facility];  Service: Endoscopy;;    HEMATOLOGY/ONCOLOGY HISTORY:  Oncology History  Lung cancer metastatic to bone ([place])  03/21/2022 Initial Diagnosis   Lung cancer metastatic to bone ([place])   04/03/2022 Cancer Staging   Staging form: Lung, AJCC 8th Edition - Clinical stage from 04/03/2022: Stage IVB (cT1a, cN2, cM1c) - Signed by Sindy Guadeloupe, MD on 04/03/2022   04/09/2022 - 05/28/2022 Chemotherapy   Patient is on Treatment Plan : LUNG NSCLC Pemetrexed + Carboplatin q21d x 4 Cycles     07/02/2022 -  Chemotherapy   Patient is on Treatment Plan : NSCLC Carboplatin +  Paclitaxel Weekly X 6 Weeks with XRT       ALLERGIES:  is allergic to latex and levofloxacin.  MEDICATIONS:  Current Facility-Administered Medications  Medication Dose Route Frequency Provider Last Rate Last Admin   fentaNYL (DURAGESIC) 12 MCG/HR 1 patch  1 patch Transdermal Q72H Athena Masse, MD   1 patch at 07/02/22  0930   glycopyrrolate (ROBINUL) injection 0.2 mg  0.2 mg Intravenous Q4H PRN Sharen Hones, MD       LORazepam (ATIVAN) injection 0.5 mg  0.5 mg Intravenous Q4H PRN Kaetlin Bullen, Kirt Boys, NP       morphine (PF) 2 MG/ML injection 1-2 mg  1-2 mg Intravenous Q30 min PRN Amish Mintzer, Kirt Boys, NP       morphine CONCENTRATE 10 MG/0.5ML oral solution 5 mg  5 mg Oral Q2H PRN Sharen Hones, MD       Or   morphine CONCENTRATE 10 MG/0.5ML oral solution 5 mg  5 mg Sublingual Q2H PRN Sharen Hones, MD       Current Outpatient Medications  Medication Sig Dispense Refill   acetaminophen (TYLENOL) 500 MG tablet Take 1,000 mg by mouth every 6 (six) hours as needed for moderate pain.     albuterol (PROVENTIL) (2.5 MG/3ML) 0.083% nebulizer solution Inhale 2.5 mg into the lungs every 6 (six) hours.     albuterol (VENTOLIN HFA) 108 (90 Base) MCG/ACT inhaler Inhale 2 puffs into the lungs every 6 (six) hours as needed for wheezing or shortness of breath. 8 g 2   ALPRAZolam (XANAX) 0.5 MG tablet Take 1 tablet (0.5 mg total) by mouth 2 (two) times daily as needed for anxiety. 60 tablet 2   fenofibrate micronized (LOFIBRA) 134 MG capsule Take 1 capsule (134 mg total) by mouth daily before breakfast. 30 capsule 11   fentaNYL (DURAGESIC) 12 MCG/HR Place 1 patch onto the skin every 3 (three) days. 10 patch 0   levothyroxine (SYNTHROID) 75 MCG tablet TAKE 1 TABLET EVERY DAY ON EMPTY STOMACHWITH A GLASS OF WATER AT LEAST 30-60 MINBEFORE BREAKFAST (Patient taking differently: Take 75 mcg by mouth daily before breakfast. On an empty stomach with a glass of water at least 30-60 minutes before breakfast.) 90 tablet 1   omeprazole (PRILOSEC) 20 MG capsule TAKE 1 CAPSULE BY MOUTH DAILY (Patient taking differently: Take 20 mg by mouth daily as needed.) 30 capsule 3   ondansetron (ZOFRAN) 4 MG tablet Take 1 tablet (4 mg total) by mouth every 6 (six) hours as needed for nausea. 30 tablet 1   oxyCODONE (OXY IR/ROXICODONE) 5 MG immediate release  tablet Take 1-2 tablets (5-10 mg total) by mouth every 4 (four) hours as needed (pain). 60 tablet 0   potassium chloride (KLOR-CON M) 10 MEQ tablet Take 1 tablet (10 mEq total) by mouth daily. 10 tablet 0   prednisoLONE acetate (PRED FORTE) 1 % ophthalmic suspension Place 1 drop into the right eye daily.     RESTASIS 0.05 % ophthalmic emulsion Place 1 drop into both eyes 2 (two) times daily.     rosuvastatin (CRESTOR) 10 MG tablet TAKE ONE TABLET EVERY DAY. (Patient taking differently: Take 10 mg by mouth daily.) 90 tablet 3   mirtazapine (REMERON) 15 MG tablet Take 1 tablet (15 mg total) by mouth at bedtime. (Patient not taking: Reported on 07/02/2022) 30 tablet 1   OLANZapine (ZYPREXA) 10 MG tablet Take 1 tablet (10 mg total) by mouth at bedtime. (Patient not taking: Reported on 07/02/2022) 30 tablet 2  promethazine (PHENERGAN) 12.5 MG tablet Take 1 tablet (12.5 mg total) by mouth every 8 (eight) hours as needed for nausea or vomiting. (Patient not taking: Reported on 04/09/2022) 30 tablet 0    VITAL SIGNS: BP (!) 88/45   Pulse 81   Temp 98.3 F (36.8 C) (Oral)   Resp 13   Ht 5\' 1"  (1.549 m)   Wt 148 lb (67.1 kg)   SpO2 100%   BMI 27.96 kg/m  Filed Weights   07/02/2022 2300  Weight: 148 lb (67.1 kg)    Estimated body mass index is 27.96 kg/m as calculated from the following:   Height as of this encounter: 5\' 1"  (1.549 m).   Weight as of this encounter: 148 lb (67.1 kg).  LABS: CBC:    Component Value Date/Time   WBC 17.7 (H) 07/03/2022 0503   HGB 7.5 (L) 07/03/2022 0503   HCT 25.2 (L) 07/03/2022 0503   PLT 198 07/03/2022 0503   MCV 96.2 07/03/2022 0503   NEUTROABS 18.0 (H) 07/02/2022 2322   LYMPHSABS 0.8 06/13/2022 2322   MONOABS 1.8 (H) 06/12/2022 2322   EOSABS 0.2 07/02/2022 2322   BASOSABS 0.1 07/07/2022 2322   Comprehensive Metabolic Panel:    Component Value Date/Time   NA 140 07/03/2022 0503   NA 142 10/02/2020 0938   K 3.6 07/03/2022 0503   CL 103 07/03/2022  0503   CO2 28 07/03/2022 0503   BUN 18 07/03/2022 0503   BUN 22 10/02/2020 0938   CREATININE 1.47 (H) 07/03/2022 0503   CREATININE 0.95 01/29/2016 1429   GLUCOSE 127 (H) 07/03/2022 0503   CALCIUM 7.6 (L) 07/03/2022 0503   AST 21 06/11/2022 2322   ALT 11 06/26/2022 2322   ALKPHOS 127 (H) 07/09/2022 2322   BILITOT 0.5 07/02/2022 2322   PROT 7.3 06/22/2022 2322   ALBUMIN 2.4 (L) 06/19/2022 2322    RADIOGRAPHIC STUDIES: US RENAL  Result Date: 07/02/2022 CLINICAL DATA:  Acute renal failure EXAM: RENAL / URINARY TRACT ULTRASOUND COMPLETE COMPARISON:  CT chest/abdomen/pelvis 06/17/2022 FINDINGS: Right Kidney: Renal measurements: 10.3 cm x 5.1 cm x 5.1 cm = volume: 137 mL. Echogenicity within normal limits. No mass or hydronephrosis visualized. Left Kidney: Renal measurements: 10.9 cm x 6.3 cm x 5.5 cm = volume: 199 mL. Echogenicity within normal limits. No mass or hydronephrosis visualized. Bladder: Appears normal for degree of bladder distention. Other: None. IMPRESSION: Normal renal ultrasound. Electronically Signed   By: Valetta Mole M.D.   On: 07/02/2022 17:51   CT Angio Chest PE W and/or Wo Contrast  Result Date: 07/02/2022 CLINICAL DATA:  Concern for pulmonary embolism. Worsening shortness of breath. History of metastatic lung cancer. EXAM: CT ANGIOGRAPHY CHEST WITH CONTRAST TECHNIQUE: Multidetector CT imaging of the chest was performed using the standard protocol during bolus administration of intravenous contrast. Multiplanar CT image reconstructions and MIPs were obtained to evaluate the vascular anatomy. RADIATION DOSE REDUCTION: This exam was performed according to the departmental dose-optimization program which includes automated exposure control, adjustment of the mA and/or kV according to patient size and/or use of iterative reconstruction technique. CONTRAST:  37mL OMNIPAQUE IOHEXOL 350 MG/ML SOLN COMPARISON:  Chest CT dated 06/17/2022. FINDINGS: Cardiovascular: There is no  cardiomegaly. No significant pericardial effusion. Mild atherosclerotic calcification of the thoracic aorta. No aneurysmal dilatation or dissection. The origins of the great vessels of the aortic arch appear patent. Evaluation of the pulmonary arteries is limited due to suboptimal opacification and timing of the contrast. Apparent focal nonopacification  of a branch of the pulmonary artery in the left lower lobe (70/5) likely artifactual and related to streak artifact. No large or central pulmonary artery embolus identified. Right-sided Port-A-Cath with tip close to the cavoatrial junction. Mediastinum/Nodes: Right hilar mass extending into the mediastinum and measuring approximately 6.6 x 6.1 cm and progressed since the prior CT. Mediastinal adenopathy in the prevascular space measure up to 1.3 cm also progressed since the prior CT. No mediastinal fluid collection. Lungs/Pleura: There is high-grade narrowing of the right middle lobe bronchus due to compression by the right hilar mass. There is large area of consolidation involving the right middle and right lower lobes which may represent postobstructive atelectasis or infiltrate versus mass. Scattered bilateral upper lobe pulmonary nodularity consistent with metastatic disease. There is diffuse interstitial and interlobular septal thickening in the right upper lobe which may represent edema. Lymphangitic spread of tumor is not excluded. Small bilateral pleural effusions. No pneumothorax. The central airways remain patent. Upper Abdomen: Indeterminate 13 mm hypoenhancing lesion in the right lobe of the liver (95/5). There is layering sludge and small stones. Musculoskeletal: Osteopenia with degenerative changes of the spine. Multiple in osseous lucent lesions consistent with metastatic disease. Review of the MIP images confirms the above findings. IMPRESSION: 1. No large or central pulmonary artery embolus identified. 2. Interval progression of the right hilar mass  and mediastinal adenopathy. 3. Large area of consolidation involving the right middle and right lower lobes may represent postobstructive atelectasis or infiltrate versus mass. 4. Scattered bilateral upper lobe pulmonary nodularity consistent with metastatic disease. 5. Small bilateral pleural effusions. 6. Osseous metastatic disease. 7. Indeterminate 13 mm hypoenhancing lesion in the right lobe of the liver. 8. Cholelithiasis. 9.  Aortic Atherosclerosis (ICD10-I70.0). Electronically Signed   By: Anner Crete M.D.   On: 07/02/2022 01:19   DG Chest Portable 1 View  Result Date: 06/19/2022 CLINICAL DATA:  Shortness of breath EXAM: PORTABLE CHEST 1 VIEW COMPARISON:  Chest x-ray 06/25/2022 FINDINGS: Right chest port catheter tip projects over the SVC, unchanged. There are patchy airspace opacities in the right mid and lower lung which have mildly increased. Small right pleural effusion persists. There central pulmonary vascular congestion and central interstitial prominence which has increased. There is no pneumothorax. The cardiomediastinal silhouette is stable. No acute fractures are seen. IMPRESSION: 1. Increasing airspace opacities in the right mid and lower lung. 2. Stable small right pleural effusion. 3. Increasing central pulmonary vascular congestion and central interstitial prominence, likely edema. Electronically Signed   By: Ronney Asters M.D.   On: 07/03/2022 23:31   DG Chest 2 View  Result Date: 06/25/2022 CLINICAL DATA:  Shortness of breath. History of metastatic lung cancer on chemotherapy. EXAM: CHEST - 2 VIEW COMPARISON:  Chest two views 04/25/2022, CT chest 06/17/2022 FINDINGS: Right chest wall porta catheter tip overlies the central superior vena cava. Cardiac silhouette and mediastinal contours are unchanged. Note is made of a right hilar mass better seen on recent 06/17/2021 CT. Moderate calcification within aortic arch. Moderate elevation of the right hemidiaphragm. Small right pleural  effusion and inferolateral pleural thickening is increased from 04/25/2022 radiograph and consistent with findings on recent 06/17/2022 CT. Mild bilateral mid and lower lung interstitial thickening. No pneumothorax. Mild-to-moderate multilevel degenerative disc changes of the thoracic spine. IMPRESSION: 1. Mild bilateral mid and lower lung interstitial thickening, possible mild interstitial pulmonary edema. 2. Small right pleural effusion and inferolateral pleural thickening, increased from 04/25/2022 radiograph and consistent with findings on recent 06/17/2022 CT. 3.  Note is made of a right hilar mass better seen on recent 06/17/2021 CT. Electronically Signed   By: Yvonne Kendall M.D.   On: 06/25/2022 12:57   CT CHEST ABDOMEN PELVIS W CONTRAST  Result Date: 06/17/2022 CLINICAL DATA:  Metastatic lung cancer. Current chemotherapy patient. Shortness of breath. * Tracking Code: BO * EXAM: CT CHEST, ABDOMEN, AND PELVIS WITH CONTRAST TECHNIQUE: Multidetector CT imaging of the chest, abdomen and pelvis was performed following the standard protocol during bolus administration of intravenous contrast. RADIATION DOSE REDUCTION: This exam was performed according to the departmental dose-optimization program which includes automated exposure control, adjustment of the mA and/or kV according to patient size and/or use of iterative reconstruction technique. CONTRAST:  167mL OMNIPAQUE IOHEXOL 300 MG/ML  SOLN COMPARISON:  03/20/2022 FINDINGS: CT CHEST FINDINGS Cardiovascular: Right Port-A-Cath tip: Lower SVC. Progressive right hilar and mediastinal mass causes worsened narrowing of the right upper lobe and right lower lobe pulmonary arteries (image 22 and 26 of series 3, respectively) and increased encroachment and extrinsic compression of the lower SVC (image 24, series 3). Aortic arch atherosclerotic vascular disease.  Mild cardiomegaly. Mediastinum/Nodes: As noted above, there is progressive confluent right hilar and  mediastinal adenopathy contributing to narrowing of the trachea at the carina (image 19, series 3) and narrowing of the bronchus intermedius (image 24, series 3). Tracheomalacia may also be contributing to the tracheal narrowing given the somewhat slit-like cross-sectional contour of the lower trachea. Overall central right tumor measures about 5.1 by 5.7 cm on image 24 series 3, previously this was made up of individual nodes measuring up to about 2.3 cm individually. A right lower paratracheal node measuring 1.9 cm in short axis on image 19 series 3 previously measured about 2.0 cm. A right internal mammary lymph node currently measures 1.9 cm in short axis on image 29 series 3 (formerly 1.1 cm) and invades the third intercostal space. Lungs/Pleura: Small right pleural effusion with possible tethering of the visceral and parietal pleura on the right on image 27 of series 3. Trace left pleural effusion. Marked airway narrowing and plugging in the right upper lobe with secondary pulmonary lobular septal thickening and ground-glass opacities throughout much of the right upper lobe. Substantial atelectasis in the right middle lobe and right lower lobe with marked airway narrowing. On the left side there is a somewhat lesser degree of secondary pulmonary lobular septal thickening but with scattered ground-glass pulmonary nodularity and scattered additional potential metastatic nodularity. This includes a new 1.2 by 1.0 cm nodule anteriorly in the left upper lobe on image 51 series 4 and scattered smaller nodules along the left pleural surfaces, in the left upper lobe, and left lower lobe. Musculoskeletal: Progressive lytic destruction of the right fourth rib, with a new soft tissue mass component measuring 3.0 by 1.6 cm in the right chest wall on image 24 series 3. Probably pathologic fracture of the right eleventh rib posteriorly on image 48 of series 3 is new and associated with an abnormal rib lucency which is  likewise new. Small lytic lesion of the left posterior sternal manubrium adjacent to the sternoclavicular joint. Lytic lesion in the right distal clavicle. Lucent lesions at T7, T9, and T11 in the thoracic spine compatible with new thoracic spine metastatic lesions. There is also some left posterior vertebral body and pedicle lytic involvement at the T7 vertebra. Right lower axillary lymph node or posterior breast nodule is enhancing and measures 1.3 cm in diameter on image 39 of series 3, new  compared to the prior exam likely representing malignancy. A new small enhancing nodule in the left lower breast measures 0.7 cm in diameter on image 41 series 3. Subcutaneous nodule posterior to the left latissimus dorsi muscle measuring 1.2 by 0.8 cm on image 30 series 3, new compared to the prior exam and suspicious for subcutaneous metastatic lesion. CT ABDOMEN PELVIS FINDINGS Hepatobiliary: Unremarkable Pancreas: Unremarkable Spleen: There are 2-3 new small hypodense lesions in the spleen which are technically nonspecific but given the clinical context raise some concern for potential early metastatic disease. A 0.7 cm lesion in the lower spleen on image 52 of series 3 represents an index lesion. Adrenals/Urinary Tract: Right adrenal mass 3.3 by 1.6 cm on image 47 series 3, formerly 3.7 by 2.0 cm. Appearance suspicious for malignancy but mildly improved from previous. Kidneys unremarkable. Stomach/Bowel: Sigmoid colon diverticulosis. Vascular/Lymphatic: Atherosclerosis is present, including aortoiliac atherosclerotic disease. Reproductive: Partially obscured by streak artifact from the right hip implant. Suspected hysterectomy. Other: New 0.8 by 0.5 cm soft tissue nodule along the lower omentum, image 98 of series 3. New right lower quadrant omental nodule, 0.9 by 0.8 cm on image 88 series 3. Musculoskeletal: Lytic metastatic lesions particularly at L1, L4, L5, in the right iliac bone (image 130, series 5), and in the  left iliac bone (image 95, series 5). These are new and/or substantially enlarged compared to previous. New soft tissue mass in the left gluteus maximus muscle, 2.4 by 1.6 cm on image 98 series 3, compatible with metastatic lesion. 0.8 cm nodule along the superficial fascia margin of the gluteal musculature on the right on image 93 series 3. Multifocal rim enhancing nodularity in the lumbar paraspinal musculature, including a 1.1 cm in diameter lesion in the left paraspinal musculature on image 61 series 3, compatible with additional intramuscular metastatic lesions. IMPRESSION: 1. Significant progression of malignancy, with enlargement of the right hilar and mediastinal mass, progressive confluent right hilar and mediastinal adenopathy, new bilateral pulmonary nodules, new bilateral pleural effusions, new subcutaneous and intramuscular metastatic lesions, and new and/or enlarging lytic osseous metastatic lesions. 2. There is also increased narrowing of the trachea at the carina and narrowing of the bronchus intermedius due to extrinsic compression from the right hilar and mediastinal mass. There is also considerable airway narrowing and plugging in the right upper lobe. 3. New small hypodense lesions in the spleen are technically nonspecific but raise the possibility of early metastatic disease. 4. Reduced size of the right adrenal mass, currently 3.3 by 1.6 cm, previously 3.7 by 2.0 cm. 5. New small right and trace left pleural effusions. The right pleural effusion may be partially loculated and there is some tethering of the visceral and parietal pleura on the right. Trace left pleural effusion. 6. Progressive lytic involvement of the right fourth rib and right eleventh rib. 7. New small soft tissue nodules in the subcutaneous tissues posterior to the left latissimus dorsi muscle and in the right gluteal musculature compatible with subcutaneous metastatic lesions. New small enhancing inferior breast nodules,  likely metastatic. 8. Aortic atherosclerosis. Aortic Atherosclerosis (ICD10-I70.0). Electronically Signed   By: Van Clines M.D.   On: 06/17/2022 19:45   DG Sniff Test  Result Date: 06/04/2022 CLINICAL DATA:  Shortness of breath, right lung cancer EXAM: CHEST FLUOROSCOPY TECHNIQUE: Real-time fluoroscopic evaluation of the chest was performed. FLUOROSCOPY: Radiation Exposure Index (as provided by the fluoroscopic device): 2.9 mGy COMPARISON:  CT chest 03/20/2022 FINDINGS: Real-time fluoroscopy of the diaphragms was performed during inspiration, expiration  and sniffing. Elevation of the right diaphragm. Right basilar atelectasis. No significant diaphragmatic motion during inspiration, expiration or sniffing. Normal left diaphragmatic motion during inspiration, expiration and sniffing. IMPRESSION: 1. No significant right diaphragmatic motion during inspiration, expiration or sniffing consistent with right diaphragmatic paralysis. Electronically Signed   By: Kathreen Devoid M.D.   On: 06/04/2022 12:38    PERFORMANCE STATUS (ECOG) : 4 - Bedbound  Review of Systems Unable to complete  Physical Exam General: Critically ill-appearing Pulmonary: Agonal breathing Extremities: no edema, no joint deformities Skin: no rashes Neurological: Weakness but otherwise nonfocal  IMPRESSION: Follow-up visit.  Patient seen in the ED.  Family at bedside.  Patient acutely declined earlier today with worsening respiratory failure and altered mental status and was transitioned to comfort care.  Husband at bedside confirms a desire just to focus on comfort measures until patient's end-of-life.  Patient does not appear clinically stable to consider outpatient hospice.  Anticipate in-hospital death.  Comfort meds ordered.  Discussed care with RN.  PLAN: -Comfort care -Anticipate in-hospital death  Case and plan discussed with Dr. Janese Banks  Time Total: 20 minutes  Visit consisted of counseling and education  dealing with the complex and emotionally intense issues of symptom management and palliative care in the setting of serious and potentially life-threatening illness.Greater than 50%  of this time was spent counseling and coordinating care related to the above assessment and plan.  Signed by: Altha Harm, PhD, NP-C

## 2022-07-03 NOTE — ED Notes (Signed)
This nurse went in the pt's room around 1145 to check on patient. This nurse immediately noticed a change in [place]. Pt was unable to keep eyes open unless painful stimuli. Pt was agonal gasping, breathing 8-10 times a minute with a long pause in between. This nurse called respiratory to verify the situation and to see if the pt is an DNR. The provider was near by making rounds and was pulled aside to ask if he can see the pt. The provider's decision was with family by implementing comfort care measures. This nurse changed the vital sign machine to comfort care and d/c all running fluids at this time.

## 2022-07-03 NOTE — ED Notes (Signed)
This nurse when in to check on pt and checked for pulse by palpating the radial pulse. Pulse was strong but was slightly weaker compared to last palpation beating at 68 beats a min. This was verified by Ubaldo Glassing RN

## 2022-07-03 NOTE — ED Notes (Addendum)
This nurse when in to check on pt and checked for pulse by palpating the radial pulse. Pulse was strong beating at 60-70 beats a min. BP cuff, cardiac leads and pulse Ox was removed.

## 2022-07-03 NOTE — ED Notes (Signed)
his nurse when in to check on pt and checked for pulse by palpating the radial pulse. Pulse was strong but was slightly weaker compared to last palpation beating at 60-70 beats a min.

## 2022-07-04 ENCOUNTER — Other Ambulatory Visit: Payer: Self-pay | Admitting: Radiation Therapy

## 2022-07-04 ENCOUNTER — Ambulatory Visit: Payer: PPO

## 2022-07-07 ENCOUNTER — Ambulatory Visit: Payer: PPO

## 2022-07-07 DIAGNOSIS — J449 Chronic obstructive pulmonary disease, unspecified: Secondary | ICD-10-CM | POA: Diagnosis not present

## 2022-07-07 LAB — CULTURE, BLOOD (ROUTINE X 2)
Culture: NO GROWTH
Culture: NO GROWTH
Special Requests: ADEQUATE

## 2022-07-08 ENCOUNTER — Ambulatory Visit: Payer: PPO

## 2022-07-09 ENCOUNTER — Inpatient Hospital Stay: Payer: PPO | Admitting: Oncology

## 2022-07-09 ENCOUNTER — Ambulatory Visit: Payer: PPO

## 2022-07-09 ENCOUNTER — Other Ambulatory Visit: Payer: PPO

## 2022-07-09 ENCOUNTER — Inpatient Hospital Stay: Payer: PPO

## 2022-07-10 ENCOUNTER — Other Ambulatory Visit: Payer: PPO

## 2022-07-10 ENCOUNTER — Ambulatory Visit: Payer: PPO

## 2022-07-10 NOTE — Progress Notes (Signed)
CROSS COVER NOTE  NAME: Lorraine Mills MRN: [id number] DOB : 1950-07-10   HPI/Events of Note   Patient under comfort care passed away at 0249 this am. Death pronounced by 2 nurses on this day, 08/03/2022.  Assessment and  Interventions     Plan: Message sent to attending       Kathlene Cote NP [facility]

## 2022-07-10 NOTE — Death Summary Note (Signed)
DEATH SUMMARY   Patient Details  Name: Lorraine Mills MRN: [id number] DOB: Nov 11, 1950 [name], [name], MD Admission/Discharge Information   Admit Date:  07-09-2022  Date of Death: Date of Death: Jul 12, 2022  Time of Death: Time of Death: 0249  Length of Stay: 2   Principle Cause of death: Metastatic lung cancer  Hospital Diagnoses: Principal Problem:   Postobstructive pneumonia Active Problems:   Lung cancer metastatic to bone [facility])   Multiple lesions of metastatic malignancy, ribs , vertebra, skull, adrenals(HCC)   Overweight (BMI 25.0-29.9)   Pathologic rib fracture, initial encounter   Acute on chronic respiratory failure with hypoxia (HCC)   Chronic kidney disease, stage 3b (HCC)   AKI (acute kidney injury) [facility])   Palliative care encounter   Pneumonia   Hospital Course: AMIYAH Mills is a 72 y.o. 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  Patient came to the hospital with worsening shortness of breath, she developed severe hypoxemia, was placed on 10 L oxygen. CTscan showed right hilar mass, with large consolidation in the right lower lobe and right middle lobe, this is consistent with postobstructive pneumonia.  Patient was started on cefepime. Seen by oncology and apparently care, recommended hospice care.  Patient condition worsened on 1/25, transition to comfort care.  Assessment and Plan: Postobstructive pneumonia Acute on chronic respiratory failure with hypoxia Lung cancer metastatic to bone [facility]) Metastatic to bone including skull, iliac bones, thoracic vertebra and ribs S/p recent pathologic rib fracture as well as to right adrenal Discussed with oncology and pulmonology, patient condition has deteriorated, cancer has  grown.  No additional treatment available.  Patient has been seen by palliative care yesterday, changed to DO NOT RESUSCITATE status.  Patient condition further deteriorated 1/25, she has significant respiratory distress. After discussion with patient and husband, decision was made to transition to comfort care.  Patient passed away this morning.   Acute kidney injury. No need for additional treatment.   Anemia of chronic disease.       Procedures: None  Consultations: oncology, pulmonology  The results of significant diagnostics from this hospitalization (including imaging, microbiology, ancillary and laboratory) are listed below for reference.   Significant Diagnostic Studies: US RENAL  Result Date: 07/02/2022 CLINICAL DATA:  Acute renal failure EXAM: RENAL / URINARY TRACT ULTRASOUND COMPLETE COMPARISON:  CT chest/abdomen/pelvis 06/17/2022 FINDINGS: Right Kidney: Renal measurements: 10.3 cm x 5.1 cm x 5.1 cm = volume: 137 mL. Echogenicity within normal limits. No mass or hydronephrosis visualized. Left Kidney: Renal measurements: 10.9 cm x 6.3 cm x 5.5 cm = volume: 199 mL. Echogenicity within normal limits. No mass or hydronephrosis visualized. Bladder: Appears normal for degree of bladder distention. Other: None. IMPRESSION: Normal renal ultrasound. Electronically Signed   By: Valetta Mole M.D.   On: 07/02/2022 17:51   CT Angio Chest PE W and/or Wo Contrast  Result Date: 07/02/2022 CLINICAL DATA:  Concern for pulmonary embolism. Worsening shortness of breath. History of metastatic lung cancer. EXAM: CT ANGIOGRAPHY CHEST WITH CONTRAST TECHNIQUE: Multidetector CT imaging of the chest was performed using the standard protocol during bolus administration of intravenous contrast. Multiplanar CT image reconstructions and MIPs were obtained to evaluate the vascular anatomy. RADIATION DOSE REDUCTION: This exam was performed according to the departmental dose-optimization program which includes  automated exposure control, adjustment of the mA and/or kV according to patient size and/or use of iterative reconstruction technique. CONTRAST:  9mL OMNIPAQUE IOHEXOL 350 MG/ML SOLN COMPARISON:  Chest CT dated 06/17/2022. FINDINGS: Cardiovascular: There is no cardiomegaly. No significant pericardial effusion. Mild atherosclerotic calcification of the thoracic aorta. No aneurysmal dilatation or dissection. The origins of the great vessels of the aortic arch appear patent. Evaluation of the pulmonary arteries is limited due to suboptimal opacification and timing of the contrast. Apparent focal nonopacification of a branch of the pulmonary artery in the left lower lobe (70/5) likely artifactual and related to streak artifact. No large or central pulmonary artery embolus identified. Right-sided Port-A-Cath with tip close to the cavoatrial junction. Mediastinum/Nodes: Right hilar mass extending into the mediastinum and measuring approximately 6.6 x 6.1 cm and progressed since the prior CT. Mediastinal adenopathy in the prevascular space measure up to 1.3 cm also progressed since the prior CT. No mediastinal fluid collection. Lungs/Pleura: There is high-grade narrowing of the right middle lobe bronchus due to compression by the right hilar mass. There is large area of consolidation involving the right middle and right lower lobes which may represent postobstructive atelectasis or infiltrate versus mass. Scattered bilateral upper lobe pulmonary nodularity consistent with metastatic disease. There is diffuse interstitial and interlobular septal thickening in the right upper lobe which may represent edema. Lymphangitic spread of tumor is not excluded. Small bilateral pleural effusions. No pneumothorax. The central airways remain patent. Upper Abdomen: Indeterminate 13 mm hypoenhancing lesion in the right lobe of the liver (95/5). There is layering sludge and small stones. Musculoskeletal: Osteopenia with degenerative  changes of the spine. Multiple in osseous lucent lesions consistent with metastatic disease. Review of the MIP images confirms the above findings. IMPRESSION: 1. No large or central pulmonary artery embolus identified. 2. Interval progression of the right hilar mass and mediastinal adenopathy. 3. Large area of consolidation involving the right middle and right lower lobes may represent postobstructive atelectasis or infiltrate versus mass. 4. Scattered bilateral upper lobe pulmonary nodularity consistent with metastatic disease. 5. Small bilateral pleural effusions. 6. Osseous metastatic disease. 7. Indeterminate 13 mm hypoenhancing lesion in the right lobe of the liver. 8. Cholelithiasis. 9.  Aortic Atherosclerosis (ICD10-I70.0). Electronically Signed   By: Anner Crete M.D.   On: 07/02/2022 01:19   DG Chest Portable 1 View  Result Date: 06/24/2022 CLINICAL DATA:  Shortness of breath EXAM: PORTABLE CHEST 1 VIEW COMPARISON:  Chest x-ray 06/25/2022 FINDINGS: Right chest port catheter tip projects over the SVC, unchanged. There are patchy airspace opacities in the right mid and lower lung which have mildly increased. Small right pleural effusion persists. There central pulmonary vascular congestion and central interstitial prominence which has increased. There is no pneumothorax. The cardiomediastinal silhouette is stable. No acute fractures are seen. IMPRESSION: 1. Increasing airspace opacities in the right mid and lower lung. 2. Stable small right pleural effusion. 3. Increasing central pulmonary vascular congestion and central interstitial prominence, likely edema. Electronically Signed   By: Ronney Asters M.D.   On: 06/26/2022 23:31   DG Chest 2 View  Result Date: 06/25/2022 CLINICAL DATA:  Shortness of breath. History of metastatic lung cancer on chemotherapy. EXAM: CHEST - 2 VIEW COMPARISON:  Chest two views 04/25/2022, CT chest 06/17/2022 FINDINGS: Right chest wall porta catheter tip overlies the  central superior vena cava. Cardiac silhouette and mediastinal contours are unchanged. Note is made of a right hilar mass better seen on recent 06/17/2021 CT. Moderate calcification  within aortic arch. Moderate elevation of the right hemidiaphragm. Small right pleural effusion and inferolateral pleural thickening is increased from 04/25/2022 radiograph and consistent with findings on recent 06/17/2022 CT. Mild bilateral mid and lower lung interstitial thickening. No pneumothorax. Mild-to-moderate multilevel degenerative disc changes of the thoracic spine. IMPRESSION: 1. Mild bilateral mid and lower lung interstitial thickening, possible mild interstitial pulmonary edema. 2. Small right pleural effusion and inferolateral pleural thickening, increased from 04/25/2022 radiograph and consistent with findings on recent 06/17/2022 CT. 3. Note is made of a right hilar mass better seen on recent 06/17/2021 CT. Electronically Signed   By: Yvonne Kendall M.D.   On: 06/25/2022 12:57   CT CHEST ABDOMEN PELVIS W CONTRAST  Result Date: 06/17/2022 CLINICAL DATA:  Metastatic lung cancer. Current chemotherapy patient. Shortness of breath. * Tracking Code: BO * EXAM: CT CHEST, ABDOMEN, AND PELVIS WITH CONTRAST TECHNIQUE: Multidetector CT imaging of the chest, abdomen and pelvis was performed following the standard protocol during bolus administration of intravenous contrast. RADIATION DOSE REDUCTION: This exam was performed according to the departmental dose-optimization program which includes automated exposure control, adjustment of the mA and/or kV according to patient size and/or use of iterative reconstruction technique. CONTRAST:  1108mL OMNIPAQUE IOHEXOL 300 MG/ML  SOLN COMPARISON:  03/20/2022 FINDINGS: CT CHEST FINDINGS Cardiovascular: Right Port-A-Cath tip: Lower SVC. Progressive right hilar and mediastinal mass causes worsened narrowing of the right upper lobe and right lower lobe pulmonary arteries (image 22 and 26 of  series 3, respectively) and increased encroachment and extrinsic compression of the lower SVC (image 24, series 3). Aortic arch atherosclerotic vascular disease.  Mild cardiomegaly. Mediastinum/Nodes: As noted above, there is progressive confluent right hilar and mediastinal adenopathy contributing to narrowing of the trachea at the carina (image 19, series 3) and narrowing of the bronchus intermedius (image 24, series 3). Tracheomalacia may also be contributing to the tracheal narrowing given the somewhat slit-like cross-sectional contour of the lower trachea. Overall central right tumor measures about 5.1 by 5.7 cm on image 24 series 3, previously this was made up of individual nodes measuring up to about 2.3 cm individually. A right lower paratracheal node measuring 1.9 cm in short axis on image 19 series 3 previously measured about 2.0 cm. A right internal mammary lymph node currently measures 1.9 cm in short axis on image 29 series 3 (formerly 1.1 cm) and invades the third intercostal space. Lungs/Pleura: Small right pleural effusion with possible tethering of the visceral and parietal pleura on the right on image 27 of series 3. Trace left pleural effusion. Marked airway narrowing and plugging in the right upper lobe with secondary pulmonary lobular septal thickening and ground-glass opacities throughout much of the right upper lobe. Substantial atelectasis in the right middle lobe and right lower lobe with marked airway narrowing. On the left side there is a somewhat lesser degree of secondary pulmonary lobular septal thickening but with scattered ground-glass pulmonary nodularity and scattered additional potential metastatic nodularity. This includes a new 1.2 by 1.0 cm nodule anteriorly in the left upper lobe on image 51 series 4 and scattered smaller nodules along the left pleural surfaces, in the left upper lobe, and left lower lobe. Musculoskeletal: Progressive lytic destruction of the right fourth rib,  with a new soft tissue mass component measuring 3.0 by 1.6 cm in the right chest wall on image 24 series 3. Probably pathologic fracture of the right eleventh rib posteriorly on image 48 of series 3 is new and associated with an  abnormal rib lucency which is likewise new. Small lytic lesion of the left posterior sternal manubrium adjacent to the sternoclavicular joint. Lytic lesion in the right distal clavicle. Lucent lesions at T7, T9, and T11 in the thoracic spine compatible with new thoracic spine metastatic lesions. There is also some left posterior vertebral body and pedicle lytic involvement at the T7 vertebra. Right lower axillary lymph node or posterior breast nodule is enhancing and measures 1.3 cm in diameter on image 39 of series 3, new compared to the prior exam likely representing malignancy. A new small enhancing nodule in the left lower breast measures 0.7 cm in diameter on image 41 series 3. Subcutaneous nodule posterior to the left latissimus dorsi muscle measuring 1.2 by 0.8 cm on image 30 series 3, new compared to the prior exam and suspicious for subcutaneous metastatic lesion. CT ABDOMEN PELVIS FINDINGS Hepatobiliary: Unremarkable Pancreas: Unremarkable Spleen: There are 2-3 new small hypodense lesions in the spleen which are technically nonspecific but given the clinical context raise some concern for potential early metastatic disease. A 0.7 cm lesion in the lower spleen on image 52 of series 3 represents an index lesion. Adrenals/Urinary Tract: Right adrenal mass 3.3 by 1.6 cm on image 47 series 3, formerly 3.7 by 2.0 cm. Appearance suspicious for malignancy but mildly improved from previous. Kidneys unremarkable. Stomach/Bowel: Sigmoid colon diverticulosis. Vascular/Lymphatic: Atherosclerosis is present, including aortoiliac atherosclerotic disease. Reproductive: Partially obscured by streak artifact from the right hip implant. Suspected hysterectomy. Other: New 0.8 by 0.5 cm soft tissue  nodule along the lower omentum, image 98 of series 3. New right lower quadrant omental nodule, 0.9 by 0.8 cm on image 88 series 3. Musculoskeletal: Lytic metastatic lesions particularly at L1, L4, L5, in the right iliac bone (image 130, series 5), and in the left iliac bone (image 95, series 5). These are new and/or substantially enlarged compared to previous. New soft tissue mass in the left gluteus maximus muscle, 2.4 by 1.6 cm on image 98 series 3, compatible with metastatic lesion. 0.8 cm nodule along the superficial fascia margin of the gluteal musculature on the right on image 93 series 3. Multifocal rim enhancing nodularity in the lumbar paraspinal musculature, including a 1.1 cm in diameter lesion in the left paraspinal musculature on image 61 series 3, compatible with additional intramuscular metastatic lesions. IMPRESSION: 1. Significant progression of malignancy, with enlargement of the right hilar and mediastinal mass, progressive confluent right hilar and mediastinal adenopathy, new bilateral pulmonary nodules, new bilateral pleural effusions, new subcutaneous and intramuscular metastatic lesions, and new and/or enlarging lytic osseous metastatic lesions. 2. There is also increased narrowing of the trachea at the carina and narrowing of the bronchus intermedius due to extrinsic compression from the right hilar and mediastinal mass. There is also considerable airway narrowing and plugging in the right upper lobe. 3. New small hypodense lesions in the spleen are technically nonspecific but raise the possibility of early metastatic disease. 4. Reduced size of the right adrenal mass, currently 3.3 by 1.6 cm, previously 3.7 by 2.0 cm. 5. New small right and trace left pleural effusions. The right pleural effusion may be partially loculated and there is some tethering of the visceral and parietal pleura on the right. Trace left pleural effusion. 6. Progressive lytic involvement of the right fourth rib and  right eleventh rib. 7. New small soft tissue nodules in the subcutaneous tissues posterior to the left latissimus dorsi muscle and in the right gluteal musculature compatible with subcutaneous metastatic lesions.  New small enhancing inferior breast nodules, likely metastatic. 8. Aortic atherosclerosis. Aortic Atherosclerosis (ICD10-I70.0). Electronically Signed   By: Van Clines M.D.   On: 06/17/2022 19:45   DG Sniff Test  Result Date: 06/04/2022 CLINICAL DATA:  Shortness of breath, right lung cancer EXAM: CHEST FLUOROSCOPY TECHNIQUE: Real-time fluoroscopic evaluation of the chest was performed. FLUOROSCOPY: Radiation Exposure Index (as provided by the fluoroscopic device): 2.9 mGy COMPARISON:  CT chest 03/20/2022 FINDINGS: Real-time fluoroscopy of the diaphragms was performed during inspiration, expiration and sniffing. Elevation of the right diaphragm. Right basilar atelectasis. No significant diaphragmatic motion during inspiration, expiration or sniffing. Normal left diaphragmatic motion during inspiration, expiration and sniffing. IMPRESSION: 1. No significant right diaphragmatic motion during inspiration, expiration or sniffing consistent with right diaphragmatic paralysis. Electronically Signed   By: Kathreen Devoid M.D.   On: 06/04/2022 12:38    Microbiology: Recent Results (from the past 240 hour(s))  Resp panel by RT-PCR (RSV, Flu A&B, Covid) Anterior Nasal Swab     Status: None   Collection Time: 06/17/2022 11:22 PM   Specimen: Anterior Nasal Swab  Result Value Ref Range Status   SARS Coronavirus 2 by RT PCR NEGATIVE NEGATIVE Final    Comment: (NOTE) SARS-CoV-2 target nucleic acids are NOT DETECTED.  The SARS-CoV-2 RNA is generally detectable in upper respiratory specimens during the acute phase of infection. The lowest concentration of SARS-CoV-2 viral copies this assay can detect is 138 copies/mL. A negative result does not preclude SARS-Cov-2 infection and should not be used as  the sole basis for treatment or other patient management decisions. A negative result may occur with  improper specimen collection/handling, submission of specimen other than nasopharyngeal swab, presence of viral mutation(s) within the areas targeted by this assay, and inadequate number of viral copies(<138 copies/mL). A negative result must be combined with clinical observations, patient history, and epidemiological information. The expected result is Negative.  Fact Sheet for Patients:  EntrepreneurPulse.com.au  Fact Sheet for Healthcare Providers:  IncredibleEmployment.be  This test is no t yet approved or cleared by the [place] FDA and  has been authorized for detection and/or diagnosis of SARS-CoV-2 by FDA under an Emergency Use Authorization (EUA). This EUA will remain  in effect (meaning this test can be used) for the duration of the COVID-19 declaration under Section 564(b)(1) of the Act, 21 U.S.C.section 360bbb-3(b)(1), unless the authorization is terminated  or revoked sooner.       Influenza A by PCR NEGATIVE NEGATIVE Final   Influenza B by PCR NEGATIVE NEGATIVE Final    Comment: (NOTE) The Xpert Xpress SARS-CoV-2/FLU/RSV plus assay is intended as an aid in the diagnosis of influenza from Nasopharyngeal swab specimens and should not be used as a sole basis for treatment. Nasal washings and aspirates are unacceptable for Xpert Xpress SARS-CoV-2/FLU/RSV testing.  Fact Sheet for Patients: EntrepreneurPulse.com.au  Fact Sheet for Healthcare Providers: IncredibleEmployment.be  This test is not yet approved or cleared by the [place] FDA and has been authorized for detection and/or diagnosis of SARS-CoV-2 by FDA under an Emergency Use Authorization (EUA). This EUA will remain in effect (meaning this test can be used) for the duration of the COVID-19 declaration under Section 564(b)(1) of  the Act, 21 U.S.C. section 360bbb-3(b)(1), unless the authorization is terminated or revoked.     Resp Syncytial Virus by PCR NEGATIVE NEGATIVE Final    Comment: (NOTE) Fact Sheet for Patients: EntrepreneurPulse.com.au  Fact Sheet for Healthcare Providers: IncredibleEmployment.be  This test is not yet approved  or cleared by the [place] and has been authorized for detection and/or diagnosis of SARS-CoV-2 by FDA under an Emergency Use Authorization (EUA). This EUA will remain in effect (meaning this test can be used) for the duration of the COVID-19 declaration under Section 564(b)(1) of the Act, 21 U.S.C. section 360bbb-3(b)(1), unless the authorization is terminated or revoked.  Performed at [facility], [address] [place], [place] [address]   Culture, blood (Routine x 2)     Status: None (Preliminary result)   Collection Time: 06/16/2022 11:29 PM   Specimen: BLOOD LEFT ARM  Result Value Ref Range Status   Specimen Description BLOOD LEFT ARM  Final   Special Requests   Final    BOTTLES DRAWN AEROBIC AND ANAEROBIC Blood Culture results may not be optimal due to an excessive volume of blood received in culture bottles   Culture   Final    NO GROWTH 2 DAYS Performed at [facility], [address] [place], [place] [address]    Report Status PENDING  Incomplete  Culture, blood (Routine x 2)     Status: None (Preliminary result)   Collection Time: 06/19/2022 11:44 PM   Specimen: BLOOD  Result Value Ref Range Status   Specimen Description BLOOD RIGHT ASSIST CONTROL  Final   Special Requests   Final    BOTTLES DRAWN AEROBIC AND ANAEROBIC Blood Culture adequate volume   Culture   Final    NO GROWTH 2 DAYS Performed at [facility], [address], [place], [place] [address]    Report Status PENDING  Incomplete    Time spent: 20 minutes  Signed: Sharen Hones, MD 07-25-22

## 2022-07-10 NOTE — Progress Notes (Signed)
No resp effort, no pulse felt. Patient expired at 19, family x2 present at bedside. Hospitalist notified, Forensic psychologist. After care done and body transported off unit.

## 2022-07-10 DEATH — deceased

## 2022-07-11 ENCOUNTER — Ambulatory Visit: Payer: PPO

## 2022-07-14 ENCOUNTER — Ambulatory Visit: Payer: PPO

## 2022-07-15 ENCOUNTER — Ambulatory Visit: Payer: PPO

## 2022-07-16 ENCOUNTER — Ambulatory Visit: Payer: PPO | Admitting: Oncology

## 2022-07-16 ENCOUNTER — Ambulatory Visit: Payer: PPO

## 2022-07-16 ENCOUNTER — Other Ambulatory Visit: Payer: PPO

## 2022-07-16 ENCOUNTER — Ambulatory Visit: Payer: Self-pay | Admitting: Urology

## 2022-07-17 ENCOUNTER — Other Ambulatory Visit: Payer: PPO

## 2022-07-17 ENCOUNTER — Ambulatory Visit: Payer: PPO

## 2022-07-18 ENCOUNTER — Ambulatory Visit: Payer: PPO

## 2022-07-21 ENCOUNTER — Ambulatory Visit: Payer: PPO

## 2022-07-22 ENCOUNTER — Ambulatory Visit: Payer: PPO

## 2022-07-23 ENCOUNTER — Ambulatory Visit: Payer: PPO

## 2022-07-23 ENCOUNTER — Other Ambulatory Visit: Payer: PPO

## 2022-07-23 ENCOUNTER — Ambulatory Visit: Payer: PPO | Admitting: Oncology

## 2022-07-24 ENCOUNTER — Ambulatory Visit: Payer: PPO

## 2022-07-25 ENCOUNTER — Ambulatory Visit: Payer: PPO

## 2022-07-30 ENCOUNTER — Ambulatory Visit: Payer: PPO

## 2022-07-30 ENCOUNTER — Other Ambulatory Visit: Payer: PPO

## 2022-07-30 ENCOUNTER — Ambulatory Visit: Payer: PPO | Admitting: Oncology

## 2022-08-06 ENCOUNTER — Ambulatory Visit: Payer: PPO

## 2022-08-06 ENCOUNTER — Ambulatory Visit: Payer: PPO | Admitting: Oncology

## 2022-08-06 ENCOUNTER — Other Ambulatory Visit: Payer: PPO

## 2022-08-06 ENCOUNTER — Ambulatory Visit: Payer: PPO | Admitting: Radiation Oncology

## 2022-08-07 ENCOUNTER — Ambulatory Visit: Payer: PPO | Admitting: Internal Medicine
